# Patient Record
Sex: Male | Born: 1950
Health system: Southern US, Community
[De-identification: ages and names within clinical notes are randomized; demographics above are authoritative.]

## PROBLEM LIST (undated history)

## (undated) DIAGNOSIS — C61 Malignant neoplasm of prostate: Secondary | ICD-10-CM

## (undated) DIAGNOSIS — K635 Polyp of colon: Secondary | ICD-10-CM

## (undated) DIAGNOSIS — I319 Disease of pericardium, unspecified: Secondary | ICD-10-CM

## (undated) DIAGNOSIS — K759 Inflammatory liver disease, unspecified: Secondary | ICD-10-CM

## (undated) DIAGNOSIS — K5790 Diverticulosis of intestine, part unspecified, without perforation or abscess without bleeding: Secondary | ICD-10-CM

## (undated) DIAGNOSIS — I2510 Atherosclerotic heart disease of native coronary artery without angina pectoris: Secondary | ICD-10-CM

## (undated) DIAGNOSIS — K224 Dyskinesia of esophagus: Secondary | ICD-10-CM

## (undated) DIAGNOSIS — K900 Celiac disease: Secondary | ICD-10-CM

## (undated) DIAGNOSIS — D649 Anemia, unspecified: Secondary | ICD-10-CM

## (undated) DIAGNOSIS — E785 Hyperlipidemia, unspecified: Secondary | ICD-10-CM

## (undated) DIAGNOSIS — J40 Bronchitis, not specified as acute or chronic: Secondary | ICD-10-CM

## (undated) DIAGNOSIS — D509 Iron deficiency anemia, unspecified: Secondary | ICD-10-CM

## (undated) DIAGNOSIS — K219 Gastro-esophageal reflux disease without esophagitis: Secondary | ICD-10-CM

## (undated) DIAGNOSIS — I4819 Other persistent atrial fibrillation: Secondary | ICD-10-CM

## (undated) HISTORY — DX: Polyp of colon: K63.5

## (undated) HISTORY — DX: Celiac disease: K90.0

## (undated) HISTORY — DX: Disease of pericardium, unspecified: I31.9

## (undated) HISTORY — PX: HERNIA REPAIR: SHX51

## (undated) HISTORY — DX: Other persistent atrial fibrillation: I48.19

## (undated) HISTORY — DX: Hyperlipidemia, unspecified: E78.5

## (undated) HISTORY — PX: TRANSURETHRAL RESECTION OF PROSTATE: SHX73

## (undated) HISTORY — DX: Gastro-esophageal reflux disease without esophagitis: K21.9

## (undated) HISTORY — DX: Malignant neoplasm of prostate: C61

## (undated) HISTORY — DX: Diverticulosis of intestine, part unspecified, without perforation or abscess without bleeding: K57.90

## (undated) HISTORY — DX: Anemia, unspecified: D64.9

## (undated) HISTORY — DX: Dyskinesia of esophagus: K22.4

## (undated) HISTORY — DX: Iron deficiency anemia, unspecified: D50.9

---

## 1999-07-07 ENCOUNTER — Emergency Department (HOSPITAL_COMMUNITY): Admission: EM | Admit: 1999-07-07 | Discharge: 1999-07-08 | Payer: Self-pay

## 2001-10-08 ENCOUNTER — Encounter: Payer: Self-pay | Admitting: Family Medicine

## 2001-10-08 ENCOUNTER — Encounter: Admission: RE | Admit: 2001-10-08 | Discharge: 2001-10-08 | Payer: Self-pay | Admitting: Family Medicine

## 2002-01-07 ENCOUNTER — Encounter: Payer: Self-pay | Admitting: Cardiovascular Disease

## 2002-01-07 ENCOUNTER — Observation Stay (HOSPITAL_COMMUNITY): Admission: EM | Admit: 2002-01-07 | Discharge: 2002-01-08 | Payer: Self-pay | Admitting: Emergency Medicine

## 2003-02-06 ENCOUNTER — Ambulatory Visit: Admission: RE | Admit: 2003-02-06 | Discharge: 2003-02-06 | Payer: Self-pay | Admitting: Family Medicine

## 2004-09-13 ENCOUNTER — Encounter: Payer: Self-pay | Admitting: Cardiovascular Disease

## 2004-09-13 HISTORY — PX: CARDIAC CATHETERIZATION: SHX172

## 2006-02-23 ENCOUNTER — Ambulatory Visit: Payer: Self-pay | Admitting: Internal Medicine

## 2006-10-22 ENCOUNTER — Ambulatory Visit: Payer: Self-pay | Admitting: Internal Medicine

## 2006-10-29 ENCOUNTER — Ambulatory Visit: Payer: Self-pay | Admitting: Internal Medicine

## 2006-12-15 ENCOUNTER — Ambulatory Visit (HOSPITAL_BASED_OUTPATIENT_CLINIC_OR_DEPARTMENT_OTHER): Admission: RE | Admit: 2006-12-15 | Discharge: 2006-12-16 | Payer: Self-pay | Admitting: Urology

## 2006-12-15 ENCOUNTER — Encounter: Payer: Self-pay | Admitting: Urology

## 2007-11-30 ENCOUNTER — Encounter: Payer: Self-pay | Admitting: Cardiovascular Disease

## 2007-11-30 HISTORY — PX: NM MYOCAR PERF WALL MOTION: HXRAD629

## 2008-10-11 ENCOUNTER — Encounter: Admission: RE | Admit: 2008-10-11 | Discharge: 2008-10-11 | Payer: Self-pay | Admitting: Family Medicine

## 2010-05-10 ENCOUNTER — Encounter
Admission: RE | Admit: 2010-05-10 | Discharge: 2010-05-10 | Payer: Self-pay | Source: Home / Self Care | Attending: Family Medicine | Admitting: Family Medicine

## 2010-09-03 NOTE — Op Note (Signed)
NAME:  Jose Warner, BILODEAU              ACCOUNT NO.:  [id number]   MEDICAL RECORD NO.:  [id number]          PATIENT TYPE:  AMB   LOCATION:  [facility]                         FACILITY:  [facility]   PHYSICIAN:  Lillette Boxer. Dahlstedt, M.D.DATE OF BIRTH:  [birth year]   DATE OF PROCEDURE:  12/15/2006  DATE OF DISCHARGE:                               OPERATIVE REPORT   PREOPERATIVE DIAGNOSIS:  Benign prostatic hypertrophy with retention.   POSTOPERATIVE DIAGNOSIS:  Benign prostatic hypertrophy with retention.   PRINCIPAL PROCEDURE:  Transurethral resection of the prostate surgery.   SURGEON:  Lillette Boxer. Dahlstedt, M.D.   ANESTHESIA:  General LMA.   COMPLICATIONS:  None.   BRIEF HISTORY:  This is a 60 year old gentleman who presented to my  office in June with voiding symptoms.  He had significant symptoms  including nighttime leakage, frequency, intermittency.  He was found to  have a significant postvoid residual.  He failed conventional medical  therapy and then underwent urodynamic study which showed high pressure  voiding with a residual urinary volume approaching a liter.  He had a  significant bleed decreased flow rate.  Cystoscopy revealed rather small  prostate, but a high bladder neck.  He had significant trabeculations  and cellule formation.  He also has a history of some hydronephrosis on  the right and reflux with voiding.   It was recommended, as he failed conventional medical therapy, that he  undergo surgical management of his BPH.  I talked about a limited  TURP/TUIP with him.  He also told them about risks and complications  including but not limited to bleeding, slight risk of postoperative  stricture, retrograde ejaculation, among others.  He understands these  and has desire to proceed.   DESCRIPTION OF PROCEDURE:  The patient was administered preoperative IV  antibiotics, identified in the holding area taken to the operating room  where general anesthetic was administered  using LMA.  He was placed in  the dorsal lithotomy position.  Genitalia and perineum were prepped and  draped.  Rigid cystoscopy was performed showing a rather small but  obstructive prostate with an elevated bladder neck.  Bladder was  inspected circumferentially.  No tumors or foreign bodies.  Multiple  significant trabeculations, small diverticula formation scattered  throughout.   The bladder was then emptied.  The resectoscope sheath was then placed  and the resectoscope element and the cutting loop placed.  A TUIP was  first performed, incising and removing the prostatic tissue from the 5  to 7 o'clock positions from bladder neck all the way to the verumontanum  which was left intact.  This opened the prostate up somewhat.  However,  there was still a lateral lobe tissue that I wanted to get rid of.  This  was resected bilaterally down to the surgical capsule with the cutting  loop.  Small bleeders were electrocoagulated.  The chips were then  evacuated from the bladder.  In the prostatic fossa with fossa was  inspected at low pressure and small bleeders were again  electrocoagulated.  At this point the bladder was reinspected.  No chips  were seen.  The resectoscope was then  removed.  A 22-French Foley catheter with 5 mL balloon was then placed  and hooked to dependent drainage.  A B & O suppository was placed.   The patient tolerated procedure well.  He was awakened and taken to PACU  in stable condition.      Lillette Boxer. Dahlstedt, M.D.  Electronically Signed     SMD/MEDQ  D:  12/15/2006  T:  12/15/2006  Job:  [id number]

## 2010-09-06 NOTE — Assessment & Plan Note (Signed)
[facility]                           GASTROENTEROLOGY OFFICE NOTE   Jose Warner, Jose Warner                     MRN:          [id number]  DATE:02/23/2006                            DOB:          1950/08/16    REASON FOR CONSULTATION:  Surveillance colonoscopy.   HISTORY:  This is a 60 year old gentleman with a history of adenomatous  colon polyps who presents today regarding surveillance colonoscopy.  His  initial examination was performed on March 01, 2003.  He was found to  have a 5 mm tubular adenoma in the region of the splenic flexure which was  removed.  Also mild diverticulosis and internal hemorrhoids.  The  preparation was excellent.  No other abnormalities.  At that time  surveillance was requested for 3 years.  Since that time, he has done well.  He did undergo a cardiac catheterization and was found to have  nonobstructive coronary artery disease which was treated medically,  otherwise well.  Currently with no GI symptoms.  He denies abdominal pain,  change in bowel habits, or rectal bleeding.   PAST MEDICAL HISTORY:  1. Hyperlipidemia.  2. Nonobstructive coronary artery disease.  3. History of adenomatous colon polyp.   PAST SURGICAL HISTORY:  None.   ALLERGIES:  SULFA which results in rash.   CURRENT MEDICATIONS:  1. Aspirin 81 mg daily.  2. Doxazosin 1 mg daily.  3. Vytorin unspecified dosage once daily.  4. Fish Oil supplement.   FAMILY HISTORY:  Negative for gastrointestinal malignancy.   SOCIAL HISTORY:  The patient is married with three children and lives with  his wife and daughter.  He has a PHD.  He works in [employer] for [employer].  He does not smoke.  He occasionally uses alcohol.   REVIEW OF SYSTEMS:  Per diagnostic evaluation form.   PHYSICAL EXAMINATION:  GENERAL:  A well-appearing male in no acute distress.  He is alert and oriented.  VITAL SIGNS:  Blood pressure was not recorded.  Heart rate is 76 and  regular.  Weight is 171.2 pounds.  He is 5 feet 6 inches in height.  HEENT:  Sclerae anicteric.  Conjunctiva are pink.  Oral mucosa is intact.  LUNGS:  Clear.  HEART:  Regular.  ABDOMEN:  Soft without tenderness, mass, or hernia.  Good bowel sounds  heard.   IMPRESSION:  This is a 60 year old gentleman who presents regarding  surveillance colonoscopy.  One small tubular adenoma 3 years ago.  Currently  without symptoms.  I discussed with him the current recommendations for  surveillance colonoscopy.  Appropriate surveillance interval for this  patient would be 5 rather than 3 years.  Since his preparation was excellent  and he is asymptomatic, then that will be the recommendation that we will  submit to.  He understands and is appreciative.  We have changed his recall  to November of 2009.  Of course, he knows if in the interim there are any  clinical issues that he should represent for re-evaluation and consideration  of interval colonoscopy.    ______________________________  Jose Warner., MD  JNP/MedQ  DD: 02/23/2006  DT: 02/23/2006  Job #: [id number]   cc:   Suszanne Conners, M.D.

## 2010-09-06 NOTE — Discharge Summary (Signed)
NAME:  Jose Warner, Jose Warner                        ACCOUNT NO.:  [id number]   MEDICAL RECORD NO.:  [id number]                   PATIENT TYPE:  INP   LOCATION:  4736                                 FACILITY:  [facility]   PHYSICIAN:  York Grice, P.A.              DATE OF BIRTH:  09-Nov-1950   DATE OF ADMISSION:  01/07/2002  DATE OF DISCHARGE:  01/08/2002                                 DISCHARGE SUMMARY   DISCHARGE DIAGNOSES:  1. Atypical chest pain.  Ruled out for myocardial infarction by negative     cardiac enzymes and nonspecific electrocardiogram and no specific     electrocardiogram changes.  2. Hypertension.  3. Family history of premature coronary artery disease.  4. History of _____ gastritis.  5. History of hepatitis.   HISTORY:  This is a 60 year old, Caucasian gentleman, who presented to the  emergency room at [facility] with complaints of chest pain for  approximately three weeks of duration.  The pain was located on the left  side of the chest and upper part of the epigastrium.  He has not had any  associated symptoms such as shortness of breath or diaphoresis.  No nausea  or vomiting.  The patient also has not had any prior history of coronary  artery disease.  No diabetes mellitus and he is a nonsmoker, but his family  history is significant for premature coronary artery disease.   Dr. Rollene Fare assessed patient at admission, and it was felt that his pain  though somewhat atypical could be related to his gastroesophageal reflux,  but still seemed prudent to admit patient on rule out MI protocol for 24  hour observation.   He was started on Aspirin, Protonix, and on beta blocker, and cardiac  enzymes were checked, as well as EKG was performed and he was placed on  telemetry monitoring.   His EKG did not show any changes.  It was normal sinus rhythm.   Cardiac enzymes had negative values times three.  His hemoglobin was 14.7,  hematocrit 40.3, platelets  215, white blood cell count 5.5, potassium 4.3,  sodium 140, BUN 16, creatinine 0.9.  Liver function tests were within normal  limits.  Lipid profile revealed elevated cholesterol to 10, LDL 145, HDL 50,  and triglyceride 75.   Next morning patient was assessed by Dr. Claiborne Billings  and was free of pain and  considered to be stable for discharge home and he was ruled out for  myocardial infarction.   DISCHARGE MEDICATIONS:  Toprol XL 25 mg q.d., Protonix 40 mg q.d., Aspirin  coated 325 mg q.d., Flomax 0.4 mg daily.   DISCHARGE ACTIVITIES:  As tolerated.   DISCHARGE DIET:  Low fat, low cholesterol diet.   SPECIAL INSTRUCTIONS:  None.   FOLLOW UP:  The patient was discharged on the weekend, so office would  contact him to schedule for an outpatient Cardiolite stress test and  appointment with physician in followup.                                                York Grice, P.A.    MK/MEDQ  D:  02/28/2002  T:  03/01/2002  Job:  [id number]   cc:   Shelva Majestic, M.D.  [address], [address], [place] [address]  Fax: [phone]

## 2010-12-12 ENCOUNTER — Other Ambulatory Visit: Payer: Self-pay | Admitting: Family Medicine

## 2010-12-12 DIAGNOSIS — R0789 Other chest pain: Secondary | ICD-10-CM

## 2010-12-18 ENCOUNTER — Ambulatory Visit
Admission: RE | Admit: 2010-12-18 | Discharge: 2010-12-18 | Disposition: A | Discharge status: Home / Self Care | Payer: BC Managed Care – PPO | Source: Ambulatory Visit | Attending: Family Medicine | Admitting: Family Medicine

## 2010-12-18 DIAGNOSIS — R0789 Other chest pain: Secondary | ICD-10-CM

## 2011-01-22 ENCOUNTER — Other Ambulatory Visit: Payer: Self-pay | Admitting: Family Medicine

## 2011-01-23 ENCOUNTER — Other Ambulatory Visit: Payer: Self-pay | Admitting: Family Medicine

## 2011-01-28 ENCOUNTER — Other Ambulatory Visit: Payer: BC Managed Care – PPO

## 2011-01-31 LAB — POCT HEMOGLOBIN-HEMACUE
Hemoglobin: 14.6
Operator id: 268271

## 2011-02-11 ENCOUNTER — Ambulatory Visit
Admission: RE | Admit: 2011-02-11 | Discharge: 2011-02-11 | Disposition: A | Discharge status: Home / Self Care | Payer: BC Managed Care – PPO | Source: Ambulatory Visit | Attending: Family Medicine | Admitting: Family Medicine

## 2011-04-22 DIAGNOSIS — K224 Dyskinesia of esophagus: Secondary | ICD-10-CM

## 2011-04-22 HISTORY — DX: Dyskinesia of esophagus: K22.4

## 2011-11-06 ENCOUNTER — Telehealth: Payer: Self-pay | Admitting: *Deleted

## 2011-11-06 NOTE — Telephone Encounter (Signed)
I called this patient in error.  This is the year he should have a colon, but Dr. Marina Goodell has to review the info on the recall charts before we call patients.

## 2011-11-06 NOTE — Telephone Encounter (Signed)
I called and asked for [name] and his wife told me he was out of town.  I advised I was calling regarding him having a colonoscopy in 2008 and he is due now in 2013.  She told me she will tell him when he comes home .  She will have him call us.

## 2011-12-09 ENCOUNTER — Encounter: Payer: Self-pay | Admitting: Internal Medicine

## 2012-01-23 ENCOUNTER — Encounter: Payer: Self-pay | Admitting: Internal Medicine

## 2012-03-10 ENCOUNTER — Ambulatory Visit (AMBULATORY_SURGERY_CENTER): Payer: BC Managed Care – PPO | Admitting: *Deleted

## 2012-03-10 VITALS — Ht 67.0 in | Wt 170.0 lb

## 2012-03-10 DIAGNOSIS — Z86010 Personal history of colonic polyps: Secondary | ICD-10-CM

## 2012-03-10 DIAGNOSIS — Z1211 Encounter for screening for malignant neoplasm of colon: Secondary | ICD-10-CM

## 2012-03-10 MED ORDER — MOVIPREP 100 G PO SOLR: ORAL | Status: DC

## 2012-03-10 NOTE — Progress Notes (Signed)
Patient was instructed to bring inhaler with him to colonoscopy procedure. He states understanding. Sherren Kerns

## 2012-03-24 ENCOUNTER — Encounter: Payer: BC Managed Care – PPO | Admitting: Internal Medicine

## 2012-04-06 ENCOUNTER — Encounter: Payer: Self-pay | Admitting: Internal Medicine

## 2012-04-06 ENCOUNTER — Ambulatory Visit (AMBULATORY_SURGERY_CENTER): Payer: BC Managed Care – PPO | Admitting: Internal Medicine

## 2012-04-06 VITALS — BP 128/73 | HR 63 | Temp 97.2°F | Resp 21 | Ht 67.0 in | Wt 170.0 lb

## 2012-04-06 DIAGNOSIS — Z86010 Personal history of colonic polyps: Secondary | ICD-10-CM

## 2012-04-06 DIAGNOSIS — Z1211 Encounter for screening for malignant neoplasm of colon: Secondary | ICD-10-CM

## 2012-04-06 MED ORDER — SODIUM CHLORIDE 0.9 % IV SOLN: 500.0000 mL | INTRAVENOUS | Status: DC

## 2012-04-06 NOTE — Op Note (Signed)
[facility]  [address] [place], [address]   COLONOSCOPY PROCEDURE REPORT  PATIENT: Jose Warner, Jose Warner  MR#: [id number] BIRTHDATE: [birth year] , 61  yrs. old GENDER: Male ENDOSCOPIST: Eustace Quail, MD REFERRED LD:[id number] Program Recall PROCEDURE DATE:  04/06/2012 PROCEDURE:   Colonoscopy, surveillance ASA CLASS:   Class II INDICATIONS:Patient's personal history of adenomatous colon polyps. - 2004, 2008 w/ TA's MEDICATIONS: MAC sedation, administered by CRNA and propofol (Diprivan) 324m IV  DESCRIPTION OF PROCEDURE:   After the risks benefits and alternatives of the procedure were thoroughly explained, informed consent was obtained.  A digital rectal exam revealed no abnormalities of the rectum.   The LB CF-[id number] endoscope was introduced through the anus and advanced to the cecum, which was identified by both the appendix and ileocecal valve. No adverse events experienced.   The quality of the prep was good, using MoviPrep  The instrument was then slowly withdrawn as the colon was fully examined.      COLON FINDINGS: Moderate diverticulosis was noted in the sigmoid colon.   The colon was otherwise normal.  There was no diverticulosis, inflammation, polyps or cancers unless previously stated.  Retroflexed views revealed internal hemorrhoids. The time to cecum=3 minutes 38 seconds.  Withdrawal time=12 minutes 40 seconds.  The scope was withdrawn and the procedure completed. COMPLICATIONS: There were no complications.  ENDOSCOPIC IMPRESSION: 1.   Moderate diverticulosis was noted in the sigmoid colon 2.   The colon was otherwise normal  RECOMMENDATIONS: 1. Follow up colonoscopy in 5 years   eSigned:  JEustace Quail MD 04/06/2012 4:18 PM   cc: PDerinda Late MD and The Patient

## 2012-04-06 NOTE — Patient Instructions (Addendum)
Impressions/recommendations:  Diverticulosis (handout given)  High Fiber diet (handout given)  Follow up colonoscopy in 5 years.  YOU HAD AN ENDOSCOPIC PROCEDURE TODAY AT THE  ENDOSCOPY CENTER: Refer to the procedure report that was given to you for any specific questions about what was found during the examination.  If the procedure report does not answer your questions, please call your gastroenterologist to clarify.  If you requested that your care partner not be given the details of your procedure findings, then the procedure report has been included in a sealed envelope for you to review at your convenience later.  YOU SHOULD EXPECT: Some feelings of bloating in the abdomen. Passage of more gas than usual.  Walking can help get rid of the air that was put into your GI tract during the procedure and reduce the bloating. If you had a lower endoscopy (such as a colonoscopy or flexible sigmoidoscopy) you may notice spotting of blood in your stool or on the toilet paper. If you underwent a bowel prep for your procedure, then you may not have a normal bowel movement for a few days.  DIET: Your first meal following the procedure should be a light meal and then it is ok to progress to your normal diet.  A half-sandwich or bowl of soup is an example of a good first meal.  Heavy or fried foods are harder to digest and may make you feel nauseous or bloated.  Likewise meals heavy in dairy and vegetables can cause extra gas to form and this can also increase the bloating.  Drink plenty of fluids but you should avoid alcoholic beverages for 24 hours.  ACTIVITY: Your care partner should take you home directly after the procedure.  You should plan to take it easy, moving slowly for the rest of the day.  You can resume normal activity the day after the procedure however you should NOT DRIVE or use heavy machinery for 24 hours (because of the sedation medicines used during the test).    SYMPTOMS TO REPORT  IMMEDIATELY: A gastroenterologist can be reached at any hour.  During normal business hours, 8:30 AM to 5:00 PM Monday through Friday, call [phone].  After hours and on weekends, please call the GI answering service at [phone] who will take a message and have the physician on call contact you.   Following lower endoscopy (colonoscopy or flexible sigmoidoscopy):  Excessive amounts of blood in the stool  Significant tenderness or worsening of abdominal pains  Swelling of the abdomen that is new, acute  Fever of 100F or higher   FOLLOW UP: If any biopsies were taken you will be contacted by phone or by letter within the next 1-3 weeks.  Call your gastroenterologist if you have not heard about the biopsies in 3 weeks.  Our staff will call the home number listed on your records the next business day following your procedure to check on you and address any questions or concerns that you may have at that time regarding the information given to you following your procedure. This is a courtesy call and so if there is no answer at the home number and we have not heard from you through the emergency physician on call, we will assume that you have returned to your regular daily activities without incident.  SIGNATURES/CONFIDENTIALITY: You and/or your care partner have signed paperwork which will be entered into your electronic medical record.  These signatures attest to the fact that that the information  above on your After Visit Summary has been reviewed and is understood.  Full responsibility of the confidentiality of this discharge information lies with you and/or your care-partner.  

## 2012-04-06 NOTE — Progress Notes (Signed)
Patient did not experience any of the following events: a burn prior to discharge; a fall within the facility; wrong site/side/patient/procedure/implant event; or a hospital transfer or hospital admission upon discharge from the facility. (G8907) Patient did not have preoperative order for IV antibiotic SSI prophylaxis. (G8918)  

## 2012-04-07 ENCOUNTER — Telehealth: Payer: Self-pay

## 2012-04-07 NOTE — Telephone Encounter (Signed)
Left Message

## 2012-05-05 ENCOUNTER — Other Ambulatory Visit: Payer: Self-pay | Admitting: Urology

## 2012-05-07 ENCOUNTER — Encounter (HOSPITAL_COMMUNITY): Payer: Self-pay | Admitting: Pharmacy Technician

## 2012-05-10 ENCOUNTER — Encounter (HOSPITAL_COMMUNITY): Payer: Self-pay | Admitting: Pharmacy Technician

## 2012-05-10 ENCOUNTER — Encounter (HOSPITAL_COMMUNITY): Payer: Self-pay

## 2012-05-10 ENCOUNTER — Ambulatory Visit (HOSPITAL_COMMUNITY)
Admission: RE | Admit: 2012-05-10 | Discharge: 2012-05-10 | Disposition: A | Discharge status: Home / Self Care | Payer: BC Managed Care – PPO | Source: Ambulatory Visit | Attending: Urology | Admitting: Urology

## 2012-05-10 ENCOUNTER — Encounter (HOSPITAL_COMMUNITY)
Admission: RE | Admit: 2012-05-10 | Discharge: 2012-05-10 | Disposition: A | Discharge status: Home / Self Care | Payer: BC Managed Care – PPO | Source: Ambulatory Visit | Attending: Urology | Admitting: Urology

## 2012-05-10 DIAGNOSIS — K759 Inflammatory liver disease, unspecified: Secondary | ICD-10-CM

## 2012-05-10 DIAGNOSIS — C61 Malignant neoplasm of prostate: Secondary | ICD-10-CM

## 2012-05-10 DIAGNOSIS — Z01812 Encounter for preprocedural laboratory examination: Secondary | ICD-10-CM | POA: Insufficient documentation

## 2012-05-10 DIAGNOSIS — J40 Bronchitis, not specified as acute or chronic: Secondary | ICD-10-CM

## 2012-05-10 DIAGNOSIS — Z01818 Encounter for other preprocedural examination: Secondary | ICD-10-CM | POA: Insufficient documentation

## 2012-05-10 HISTORY — DX: Inflammatory liver disease, unspecified: K75.9

## 2012-05-10 HISTORY — DX: Bronchitis, not specified as acute or chronic: J40

## 2012-05-10 HISTORY — DX: Malignant neoplasm of prostate: C61

## 2012-05-10 LAB — BASIC METABOLIC PANEL
BUN: 13 mg/dL (ref 6–23)
CO2: 23 mEq/L (ref 19–32)
Chloride: 104 mEq/L (ref 96–112)
Creatinine, Ser: 1.05 mg/dL (ref 0.50–1.35)

## 2012-05-10 LAB — ABO/RH: ABO/RH(D): A POS

## 2012-05-10 LAB — CBC
HCT: 40.9 % (ref 39.0–52.0)
MCV: 86.1 fL (ref 78.0–100.0)
Platelets: 239 10*3/uL (ref 150–400)
RBC: 4.75 MIL/uL (ref 4.22–5.81)
WBC: 5.9 10*3/uL (ref 4.0–10.5)

## 2012-05-10 NOTE — Patient Instructions (Addendum)
[address]  05/10/2012   Your procedure is scheduled on: 1-30  -2014  Report to [facility] at      0515  AM.  Call this number if you have problems the morning of surgery: [phone]  Or Presurgical Testing [phone](Elis Sauber)   Remember: Follow any bowel prep instructions per MD office. For Cpap use: Bring mask and tubing only.   Do not eat food:After Midnight.  Follow Clear Lliquid diet x24 hours day before surgery, start 0700 AM until midnight.  Clear liquids include soda, tea, black coffee, apple or grape juice, broth.  Take these medicines the morning of surgery with A SIP OF WATER: none. Bring ProAir inhaler to hospital and eye drops.   Do not wear jewelry, make-up or nail polish.  Do not wear lotions, powders, or perfumes. You may wear deodorant.  Do not shave 12 hours prior to first CHG shower(legs and under arms).(face and neck okay.)  Do not bring valuables to the hospital.  Contacts, dentures or bridgework,body piercing,  may not be worn into surgery.  Leave suitcase in the car. After surgery it may be brought to your room.  For patients admitted to the hospital, checkout time is 11:00 AM the day of discharge.   Patients discharged the day of surgery will not be allowed to drive home. Must have responsible person with you x 24 hours once discharged.  Name and phone number of your driver: Nita Sells, spouse -[phone]/ [phone]  Special Instructions: CHG Shower Use Special Wash: see special instructions.(avoid face and genitals)   Please read over the following fact sheets that you were given: MRSA Information, Blood Transfusion fact sheet, Incentive Spirometry Instruction.    Failure to follow these instructions may result in Cancellation of your surgery.   Patient signature_______________________________________________________

## 2012-05-10 NOTE — Pre-Procedure Instructions (Signed)
1-20-114 EKG 9'13-report with chart. CXR done today.

## 2012-05-19 NOTE — Anesthesia Preprocedure Evaluation (Addendum)
Anesthesia Evaluation  Patient identified by MRN, date of birth, ID band Patient awake    Reviewed: Allergy & Precautions, H&P , NPO status , Patient's Chart, lab work & pertinent test results  Airway Mallampati: I TM Distance: >3 FB Neck ROM: Full    Dental  (+) Dental Advisory Given and Teeth Intact   Pulmonary neg pulmonary ROS,  breath sounds clear to auscultation  Pulmonary exam normal       Cardiovascular negative cardio ROS  Rhythm:Regular Rate:Normal     Neuro/Psych negative neurological ROS  negative psych ROS   GI/Hepatic negative GI ROS, Neg liver ROS, (+) Hepatitis -, Unspecified  Endo/Other  negative endocrine ROS  Renal/GU negative Renal ROS     Musculoskeletal negative musculoskeletal ROS (+)   Abdominal   Peds  Hematology negative hematology ROS (+)   Anesthesia Other Findings   Reproductive/Obstetrics                          Anesthesia Physical Anesthesia Plan  ASA: II  Anesthesia Plan: General   Post-op Pain Management:    Induction: Intravenous  Airway Management Planned: Oral ETT  Additional Equipment:   Intra-op Plan:   Post-operative Plan: Extubation in OR  Informed Consent: I have reviewed the patients History and Physical, chart, labs and discussed the procedure including the risks, benefits and alternatives for the proposed anesthesia with the patient or authorized representative who has indicated his/her understanding and acceptance.   Dental advisory given  Plan Discussed with: CRNA  Anesthesia Plan Comments:         Anesthesia Quick Evaluation

## 2012-05-19 NOTE — H&P (Signed)
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  He was treated with a TURP by Dr. Diona Fanti in 2008. He initially presented to me in November 2013 and was noted to have a PVR > 400 cc.  He started tamsulsoin at that time and his PVR decreased to about 200 cc.  Aug 2008 - TURP (pathology benign)  2) Prostate cancer: He was found to have a new prostate nodule in 2013 and underwent a prostate biopsy on 04/12/12 for this reason which demonstrated 3 out of 12 biopsy cores to be positive for Gleason 3+3=6 adenocarcinoma of the prostate. He has no family history of prostate cancer.  TNM stage: cT2a Nx Mx PSA: 0.82 Gleason score: 3+3=6 Biopsy (04/12/12): 3/12 cores positive - L apex (< 5%), L lateral base (10%), R lateral base (10%) Prostate volume: 19.9 cc PSAD: 0.04  Baseline urinary function: IPSS was 4 at baseline but PVR was greater than 400 cc which improved after alpha blocker therapy. Baseline erectile function: SHIM score was 16.  Interval history:  He and his wife followup today to discuss his recently diagnosed prostate cancer and his options for treatment/management. Overall, he has recovered well from his biopsy although still does have some mild to moderate aching in his testicles bilaterally. He denies any fever, dysuria, or hematuria.     Past Medical History Problems  1. History of  Arthritis V13.4 2. History of  Asthma 493.90 3. History of  Heartburn 787.1 4. History of  Hepatitis 573.3 5. History of  Hypercholesterolemia 272.0 6. History of  Inguinal Hernia 550.90  Surgical History Problems  1. History of  Hernia Repair 2. History of  Transurethral Resection Of Prostate (TURP)   Right inguinal hernia repair   Current Meds 1. Aspirin 81 MG Oral Tablet; Therapy: (Recorded:20Nov2013) to 2. Centrum Silver  TABS; Therapy: (Recorded:20Nov2013) to 3. Niaspan 500 MG Oral Tablet Extended Release; Therapy: (Recorded:20Nov2013) to 4. ProAir HFA 108 (90 Base) MCG/ACT Inhalation Aerosol Solution; Therapy: 25Oct2013 to 5. Simvastatin 40 MG Oral Tablet; Therapy: [id number] to 6. Tamsulosin HCl 0.4 MG Oral Capsule; TAKE 1 CAPSULE Bedtime; Therapy: 23Dec2013 to (Last  Rx:23Dec2013)  Requested for: 23Dec2013 7. Vitamin D 2000 UNIT Oral Tablet; Therapy: (Recorded:20Nov2013) to  Allergies Medication  1. Sulfa Drugs  Family History Problems  1. Paternal history of  Acute Myocardial Infarction V17.3 2. Maternal history of  Stroke Syndrome V17.1  Social History Problems  1. Alcohol Use 1 per week 2. Paternal history of  Death In The Family Father age 52 MI 3. Marital History - Currently Married 4. Never A Smoker 5. 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 Tobacco Use  Vitals Vital Signs [Data Includes: Last 1 Day]  [id number] 12:33PM  Blood Pressure: 123 / 65 Heart Rate: 65  Physical Exam Constitutional: Well nourished and well developed . No acute distress.  ENT:. The ears and nose are normal in appearance.  Pulmonary: No respiratory distress and normal respiratory rhythm and effort.  Cardiovascular: Heart rate and rhythm are normal . No peripheral edema.  Neuro/Psych:. Mood and affect are appropriate.    Results/Data Urine [Data Includes: Last 1 Day]   [id number]  COLOR YELLOW   APPEARANCE CLEAR   SPECIFIC GRAVITY 1.010   pH 5.0   GLUCOSE NEG mg/dL  BILIRUBIN NEG   KETONE NEG mg/dL  BLOOD  NEG   PROTEIN NEG mg/dL  UROBILINOGEN 0.2 mg/dL  NITRITE NEG   LEUKOCYTE ESTERASE NEG    Assessment Assessed  1. Prostate Cancer 185 2. Benign Prostatic Hypertrophy With Urinary Obstruction 600.01  Discussion/Summary  1. Prostate cancer:   The patient was counseled about the natural history of prostate cancer and the standard treatment options that are available for prostate cancer. It was  explained to him how his age and life expectancy, clinical stage, Gleason score, and PSA affect his prognosis, the decision to proceed with additional staging studies, as well as how that information influences recommended treatment strategies. We discussed the roles for active surveillance, radiation therapy, surgical therapy, androgen deprivation, as well as ablative therapy options for the treatment of prostate cancer as appropriate to his individual cancer situation. We discussed the risks and benefits of these options with regard to their impact on cancer control and also in terms of potential adverse events, complications, and impact on quiality of life particularly related to urinary, bowel, and sexual function. The patient was encouraged to ask questions throughout the discussion today and all questions were answered to his stated satisfaction. In addition, the patient was provided with and/or directed to appropriate resources and literature for further education about prostate cancer and treatment options.   We discussed surgical therapy for prostate cancer including the different available surgical approaches. We discussed, in detail, the risks and expectations of surgery with regard to cancer control, urinary control, and erectile function as well as the expected postoperative recovery process. Additional risks of surgery including but not limited to bleeding, infection, hernia formation, nerve damage, lymphocele formation, bowel/rectal injury potentially necessitating colostomy, damage to the urinary tract resulting in urine leakage, urethral stricture, and the cardiopulmonary risks such as myocardial infarction, stroke, death, venothromboembolism, etc. were explained. The risk of open surgical conversion for robotic/laparoscopic prostatectomy was also discussed.   He is very certain that he does wish to proceed with surgical treatment of his prostate cancer. He is not interested in active  surveillance although understands that this would be a reasonable option. He has significant concerns about radiation therapy considering his history of voiding difficulties and prior prostate obstruction. He will tentatively be scheduled for a bilateral nerve sparing robotic-assisted laparoscopic radical prostatectomy in the near future. We have extensively discussed the expected recovery process especially considering the fact that he does travel extensively by plane. I told him that I would recommend a minimum of 6-8 weeks of limited local travel although could consider DVT prophylaxis should be absolutely have to travel further distances.  Cc: Dr. Derinda Late  a total of 65 minutes were spent in the overall care of the patient today Amended By: Raynelle Bring; 05/04/2012 2:11 PMEST with 60 minutes in direct face to face consultation. Amended By: Raynelle Bring; 05/04/2012 2:11 PMEST    Amendment  I did examine him today including a GU exam which did not demonstrate any scrotal abnormalities and certainly no evidence of epididymitis. I recommended anti-inflammatories for treatment of his scrotal discomfort which has persisted following his biopsy.  Amended By: Raynelle Bring; 05/04/2012 2:11 PMEST   Signatures Electronically signed by : Raynelle Bring, M.D.; May 04 2012  2:11PM

## 2012-05-20 ENCOUNTER — Encounter (HOSPITAL_COMMUNITY)
Admission: RE | Disposition: A | Discharge status: Home / Self Care | Payer: Self-pay | Source: Ambulatory Visit | Attending: Urology

## 2012-05-20 ENCOUNTER — Observation Stay (HOSPITAL_COMMUNITY)
Admission: RE | Admit: 2012-05-20 | Discharge: 2012-05-21 | Disposition: A | Discharge status: Home / Self Care | Payer: BC Managed Care – PPO | Source: Ambulatory Visit | Attending: Urology | Admitting: Urology

## 2012-05-20 ENCOUNTER — Encounter (HOSPITAL_COMMUNITY): Payer: Self-pay | Admitting: *Deleted

## 2012-05-20 ENCOUNTER — Encounter (HOSPITAL_COMMUNITY): Payer: Self-pay | Admitting: Anesthesiology

## 2012-05-20 ENCOUNTER — Inpatient Hospital Stay (HOSPITAL_COMMUNITY): Payer: BC Managed Care – PPO | Admitting: Anesthesiology

## 2012-05-20 DIAGNOSIS — C61 Malignant neoplasm of prostate: Principal | ICD-10-CM | POA: Insufficient documentation

## 2012-05-20 DIAGNOSIS — E7800 Pure hypercholesterolemia, unspecified: Secondary | ICD-10-CM | POA: Insufficient documentation

## 2012-05-20 DIAGNOSIS — Z79899 Other long term (current) drug therapy: Secondary | ICD-10-CM | POA: Insufficient documentation

## 2012-05-20 DIAGNOSIS — Z01812 Encounter for preprocedural laboratory examination: Secondary | ICD-10-CM | POA: Insufficient documentation

## 2012-05-20 DIAGNOSIS — N401 Enlarged prostate with lower urinary tract symptoms: Secondary | ICD-10-CM | POA: Insufficient documentation

## 2012-05-20 DIAGNOSIS — J3489 Other specified disorders of nose and nasal sinuses: Secondary | ICD-10-CM | POA: Insufficient documentation

## 2012-05-20 DIAGNOSIS — R110 Nausea: Secondary | ICD-10-CM | POA: Insufficient documentation

## 2012-05-20 DIAGNOSIS — Z7982 Long term (current) use of aspirin: Secondary | ICD-10-CM | POA: Insufficient documentation

## 2012-05-20 DIAGNOSIS — J45909 Unspecified asthma, uncomplicated: Secondary | ICD-10-CM | POA: Insufficient documentation

## 2012-05-20 DIAGNOSIS — K759 Inflammatory liver disease, unspecified: Secondary | ICD-10-CM | POA: Insufficient documentation

## 2012-05-20 DIAGNOSIS — N138 Other obstructive and reflux uropathy: Secondary | ICD-10-CM | POA: Insufficient documentation

## 2012-05-20 HISTORY — PX: ROBOT ASSISTED LAPAROSCOPIC RADICAL PROSTATECTOMY: SHX5141

## 2012-05-20 SURGERY — ROBOTIC ASSISTED LAPAROSCOPIC RADICAL PROSTATECTOMY LEVEL 1
Anesthesia: General | Wound class: Clean Contaminated

## 2012-05-20 MED ORDER — HYDROMORPHONE HCL PF 1 MG/ML IJ SOLN
INTRAMUSCULAR | Status: DC | PRN
  Administered 2012-05-20: 1 mg via INTRAVENOUS
  Administered 2012-05-20: 0.5 mg via INTRAVENOUS

## 2012-05-20 MED ORDER — LACTATED RINGERS IV SOLN
INTRAVENOUS | Status: DC | PRN
  Administered 2012-05-20 (×2): via INTRAVENOUS

## 2012-05-20 MED ORDER — LACTATED RINGERS IV SOLN
INTRAVENOUS | Status: DC | PRN
  Administered 2012-05-20: 08:00:00

## 2012-05-20 MED ORDER — ONDANSETRON HCL 4 MG/2ML IJ SOLN
4.0000 mg | Freq: Four times a day (QID) | INTRAMUSCULAR | Status: DC | PRN
  Administered 2012-05-20: 4 mg via INTRAVENOUS
  Filled 2012-05-20: qty 2

## 2012-05-20 MED ORDER — SODIUM CHLORIDE 0.9 % IV BOLUS (SEPSIS)
1000.0000 mL | Freq: Once | INTRAVENOUS | Status: AC
  Administered 2012-05-20: 1000 mL via INTRAVENOUS

## 2012-05-20 MED ORDER — OXYCODONE HCL 5 MG/5ML PO SOLN
5.0000 mg | Freq: Once | ORAL | Status: DC | PRN
  Filled 2012-05-20: qty 5

## 2012-05-20 MED ORDER — KCL IN DEXTROSE-NACL 20-5-0.45 MEQ/L-%-% IV SOLN
INTRAVENOUS | Status: DC
  Administered 2012-05-20 (×2): via INTRAVENOUS
  Filled 2012-05-20 (×5): qty 1000

## 2012-05-20 MED ORDER — ONDANSETRON HCL 4 MG/2ML IJ SOLN
INTRAMUSCULAR | Status: DC | PRN
  Administered 2012-05-20 (×2): 2 mg via INTRAVENOUS

## 2012-05-20 MED ORDER — GLYCOPYRROLATE 0.2 MG/ML IJ SOLN
INTRAMUSCULAR | Status: DC | PRN
  Administered 2012-05-20 (×2): 0.4 mg via INTRAVENOUS

## 2012-05-20 MED ORDER — MEPERIDINE HCL 50 MG/ML IJ SOLN: 6.2500 mg | INTRAMUSCULAR | Status: DC | PRN

## 2012-05-20 MED ORDER — CIPROFLOXACIN HCL 500 MG PO TABS: 500.0000 mg | ORAL_TABLET | Freq: Two times a day (BID) | ORAL | Status: DC

## 2012-05-20 MED ORDER — HYDROMORPHONE HCL PF 1 MG/ML IJ SOLN
0.2500 mg | INTRAMUSCULAR | Status: DC | PRN
  Administered 2012-05-20 (×2): 0.5 mg via INTRAVENOUS

## 2012-05-20 MED ORDER — MORPHINE SULFATE 10 MG/ML IJ SOLN: 2.0000 mg | INTRAMUSCULAR | Status: DC | PRN

## 2012-05-20 MED ORDER — DIPHENHYDRAMINE HCL 12.5 MG/5ML PO ELIX
12.5000 mg | ORAL_SOLUTION | Freq: Four times a day (QID) | ORAL | Status: DC | PRN
  Filled 2012-05-20: qty 10

## 2012-05-20 MED ORDER — CISATRACURIUM BESYLATE (PF) 10 MG/5ML IV SOLN
INTRAVENOUS | Status: DC | PRN
  Administered 2012-05-20: 6 mg via INTRAVENOUS
  Administered 2012-05-20: 10 mg via INTRAVENOUS

## 2012-05-20 MED ORDER — FENTANYL CITRATE 0.05 MG/ML IJ SOLN
INTRAMUSCULAR | Status: DC | PRN
  Administered 2012-05-20: 50 ug via INTRAVENOUS
  Administered 2012-05-20: 25 ug via INTRAVENOUS
  Administered 2012-05-20 (×3): 50 ug via INTRAVENOUS
  Administered 2012-05-20: 75 ug via INTRAVENOUS

## 2012-05-20 MED ORDER — MIDAZOLAM HCL 5 MG/5ML IJ SOLN
INTRAMUSCULAR | Status: DC | PRN
  Administered 2012-05-20: 2 mg via INTRAVENOUS

## 2012-05-20 MED ORDER — SODIUM CHLORIDE 0.9 % IR SOLN
Status: DC | PRN
  Administered 2012-05-20: 1000 mL

## 2012-05-20 MED ORDER — NIACIN ER (ANTIHYPERLIPIDEMIC) 500 MG PO TBCR
500.0000 mg | EXTENDED_RELEASE_TABLET | Freq: Every day | ORAL | Status: DC
  Administered 2012-05-20: 500 mg via ORAL
  Filled 2012-05-20 (×2): qty 1

## 2012-05-20 MED ORDER — ACETAMINOPHEN 10 MG/ML IV SOLN: 1000.0000 mg | Freq: Once | INTRAVENOUS | Status: DC | PRN

## 2012-05-20 MED ORDER — CEFAZOLIN SODIUM-DEXTROSE 2-3 GM-% IV SOLR
2.0000 g | INTRAVENOUS | Status: AC
  Administered 2012-05-20: 2 g via INTRAVENOUS

## 2012-05-20 MED ORDER — DIPHENHYDRAMINE HCL 50 MG/ML IJ SOLN: 12.5000 mg | Freq: Four times a day (QID) | INTRAMUSCULAR | Status: DC | PRN

## 2012-05-20 MED ORDER — NEOSTIGMINE METHYLSULFATE 1 MG/ML IJ SOLN
INTRAMUSCULAR | Status: DC | PRN
  Administered 2012-05-20: 3 mg via INTRAVENOUS

## 2012-05-20 MED ORDER — HYDROCODONE-ACETAMINOPHEN 5-325 MG PO TABS: 1.0000 | ORAL_TABLET | Freq: Four times a day (QID) | ORAL | Status: DC | PRN

## 2012-05-20 MED ORDER — ACETAMINOPHEN 325 MG PO TABS: 650.0000 mg | ORAL_TABLET | ORAL | Status: DC | PRN

## 2012-05-20 MED ORDER — SUCCINYLCHOLINE CHLORIDE 20 MG/ML IJ SOLN
INTRAMUSCULAR | Status: DC | PRN
  Administered 2012-05-20: 140 mg via INTRAVENOUS

## 2012-05-20 MED ORDER — DOCUSATE SODIUM 100 MG PO CAPS
100.0000 mg | ORAL_CAPSULE | Freq: Two times a day (BID) | ORAL | Status: DC
  Administered 2012-05-20 – 2012-05-21 (×2): 100 mg via ORAL
  Filled 2012-05-20 (×3): qty 1

## 2012-05-20 MED ORDER — STERILE WATER FOR IRRIGATION IR SOLN
Status: DC | PRN
  Administered 2012-05-20: 3000 mL

## 2012-05-20 MED ORDER — EPHEDRINE SULFATE 50 MG/ML IJ SOLN
INTRAMUSCULAR | Status: DC | PRN
  Administered 2012-05-20: 10 mg via INTRAVENOUS

## 2012-05-20 MED ORDER — ALBUTEROL SULFATE HFA 108 (90 BASE) MCG/ACT IN AERS
2.0000 | INHALATION_SPRAY | Freq: Two times a day (BID) | RESPIRATORY_TRACT | Status: DC | PRN
  Filled 2012-05-20: qty 6.7

## 2012-05-20 MED ORDER — PROMETHAZINE HCL 25 MG/ML IJ SOLN: 6.2500 mg | INTRAMUSCULAR | Status: DC | PRN

## 2012-05-20 MED ORDER — OXYCODONE HCL 5 MG PO TABS: 5.0000 mg | ORAL_TABLET | Freq: Once | ORAL | Status: DC | PRN

## 2012-05-20 MED ORDER — PROPOFOL 10 MG/ML IV EMUL
INTRAVENOUS | Status: DC | PRN
  Administered 2012-05-20: 180 mg via INTRAVENOUS

## 2012-05-20 MED ORDER — SIMVASTATIN 40 MG PO TABS
40.0000 mg | ORAL_TABLET | Freq: Every day | ORAL | Status: DC
  Administered 2012-05-20: 40 mg via ORAL
  Filled 2012-05-20 (×2): qty 1

## 2012-05-20 MED ORDER — INDIGOTINDISULFONATE SODIUM 8 MG/ML IJ SOLN
INTRAMUSCULAR | Status: DC | PRN
  Administered 2012-05-20 (×2): 5 mL via INTRAVENOUS

## 2012-05-20 MED ORDER — CEFAZOLIN SODIUM 1-5 GM-% IV SOLN
1.0000 g | Freq: Three times a day (TID) | INTRAVENOUS | Status: AC
  Administered 2012-05-20 (×2): 1 g via INTRAVENOUS
  Filled 2012-05-20 (×2): qty 50

## 2012-05-20 MED ORDER — ACETAMINOPHEN 10 MG/ML IV SOLN
INTRAVENOUS | Status: DC | PRN
  Administered 2012-05-20: 1000 mg via INTRAVENOUS

## 2012-05-20 MED ORDER — BUPIVACAINE-EPINEPHRINE 0.25% -1:200000 IJ SOLN
INTRAMUSCULAR | Status: DC | PRN
  Administered 2012-05-20: 30 mL

## 2012-05-20 MED ORDER — KETOROLAC TROMETHAMINE 15 MG/ML IJ SOLN
15.0000 mg | Freq: Four times a day (QID) | INTRAMUSCULAR | Status: AC
  Administered 2012-05-20 – 2012-05-21 (×6): 15 mg via INTRAVENOUS
  Filled 2012-05-20 (×6): qty 1

## 2012-05-20 MED ORDER — LIDOCAINE HCL (CARDIAC) 20 MG/ML IV SOLN
INTRAVENOUS | Status: DC | PRN
  Administered 2012-05-20: 20 mg via INTRAVENOUS

## 2012-05-20 SURGICAL SUPPLY — 43 items
CANISTER SUCTION 2500CC (MISCELLANEOUS) ×2 IMPLANT
CATH FOLEY 2WAY SLVR 18FR 30CC (CATHETERS) ×2 IMPLANT
CATH ROBINSON RED A/P 16FR (CATHETERS) ×2 IMPLANT
CATH ROBINSON RED A/P 8FR (CATHETERS) ×2 IMPLANT
CATH TIEMANN FOLEY 18FR 5CC (CATHETERS) ×2 IMPLANT
CHLORAPREP W/TINT 26ML (MISCELLANEOUS) ×2 IMPLANT
CLIP LIGATING HEM O LOK PURPLE (MISCELLANEOUS) ×4 IMPLANT
CLOTH BEACON ORANGE TIMEOUT ST (SAFETY) ×2 IMPLANT
CORD HIGH FREQUENCY UNIPOLAR (ELECTROSURGICAL) ×2 IMPLANT
COVER SURGICAL LIGHT HANDLE (MISCELLANEOUS) ×2 IMPLANT
COVER TIP SHEARS 8 DVNC (MISCELLANEOUS) ×1 IMPLANT
COVER TIP SHEARS 8MM DA VINCI (MISCELLANEOUS) ×1
CUTTER ECHEON FLEX ENDO 45 340 (ENDOMECHANICALS) ×2 IMPLANT
DECANTER SPIKE VIAL GLASS SM (MISCELLANEOUS) ×2 IMPLANT
DRAPE SURG IRRIG POUCH 19X23 (DRAPES) ×2 IMPLANT
DRSG TEGADERM 2-3/8X2-3/4 SM (GAUZE/BANDAGES/DRESSINGS) ×8 IMPLANT
DRSG TEGADERM 4X4.75 (GAUZE/BANDAGES/DRESSINGS) ×4 IMPLANT
DRSG TEGADERM 6X8 (GAUZE/BANDAGES/DRESSINGS) ×4 IMPLANT
ELECT REM PT RETURN 9FT ADLT (ELECTROSURGICAL) ×2
ELECTRODE REM PT RTRN 9FT ADLT (ELECTROSURGICAL) ×1 IMPLANT
GAUZE SPONGE 2X2 8PLY STRL LF (GAUZE/BANDAGES/DRESSINGS) ×1 IMPLANT
GLOVE BIO SURGEON STRL SZ 6.5 (GLOVE) ×2 IMPLANT
GLOVE BIOGEL M STRL SZ7.5 (GLOVE) ×4 IMPLANT
GOWN STRL NON-REIN LRG LVL3 (GOWN DISPOSABLE) ×6 IMPLANT
GOWN STRL REIN XL XLG (GOWN DISPOSABLE) ×4 IMPLANT
HOLDER FOLEY CATH W/STRAP (MISCELLANEOUS) ×2 IMPLANT
KIT ACCESSORY DA VINCI DISP (KITS) ×1
KIT ACCESSORY DVNC DISP (KITS) ×1 IMPLANT
NDL SAFETY ECLIPSE 18X1.5 (NEEDLE) ×1 IMPLANT
NEEDLE HYPO 18GX1.5 SHARP (NEEDLE) ×2
PACK ROBOT UROLOGY CUSTOM (CUSTOM PROCEDURE TRAY) ×2 IMPLANT
RELOAD GREEN ECHELON 45 (STAPLE) ×2 IMPLANT
SET TUBE IRRIG SUCTION NO TIP (IRRIGATION / IRRIGATOR) ×2 IMPLANT
SOLUTION ELECTROLUBE (MISCELLANEOUS) ×2 IMPLANT
SPONGE GAUZE 2X2 STER 10/PKG (GAUZE/BANDAGES/DRESSINGS) ×1
SUT ETHILON 3 0 PS 1 (SUTURE) ×2 IMPLANT
SUT VIC AB 2-0 SH 27 (SUTURE) ×1
SUT VIC AB 2-0 SH 27X BRD (SUTURE) ×1 IMPLANT
SUT VICRYL 0 UR6 27IN ABS (SUTURE) ×4 IMPLANT
SYR 27GX1/2 1ML LL SAFETY (SYRINGE) ×2 IMPLANT
TOWEL NATURAL 6PK STERILE (DISPOSABLE) ×2 IMPLANT
TOWEL OR NON WOVEN STRL DISP B (DISPOSABLE) ×2 IMPLANT
WATER STERILE IRR 1500ML POUR (IV SOLUTION) ×4 IMPLANT

## 2012-05-20 NOTE — Op Note (Signed)
Preoperative diagnosis: Clinically localized adenocarcinoma of the prostate (clinical stage cT1c Nx Mx)  Postoperative diagnosis: Clinically localized adenocarcinoma of the prostate (clinical stage cT1c Nx Mx)  Procedure:  1. Robotic assisted laparoscopic radical prostatectomy (bilateral nerve sparing)  Surgeon: Roxy Horseman, Brooke Bonito. M.D.  Assistant: Leta Baptist, PA-C  Anesthesia: General  Complications: None  EBL: 50 mL  IVF:  900 mL crystalloid  Specimens: 1. Prostate and seminal vesicles  Disposition of specimens: Pathology  Drains: 1. 20 Fr coude catheter 2. # 19 Blake pelvic drain  Indication: Jose Warner is a 62 y.o. year old patient with clinically localized prostate cancer.  After a thorough review of the management options for treatment of prostate cancer, he elected to proceed with surgical therapy and the above procedure(s).  We have discussed the potential benefits and risks of the procedure, side effects of the proposed treatment, the likelihood of the patient achieving the goals of the procedure, and any potential problems that might occur during the procedure or recuperation. Informed consent has been obtained.  Description of procedure:  The patient was taken to the operating room and a general anesthetic was administered. He was given preoperative antibiotics, placed in the dorsal lithotomy position, and prepped and draped in the usual sterile fashion. Next a preoperative timeout was performed. A urethral catheter was placed into the bladder and a site was selected near the umbilicus for placement of the camera port. This was placed using a standard open Hassan technique which allowed entry into the peritoneal cavity under direct vision and without difficulty. A 12 mm port was placed and a pneumoperitoneum established. The camera was then used to inspect the abdomen and there was no evidence of any intra-abdominal injuries or other abnormalities. The  remaining abdominal ports were then placed. 8 mm robotic ports were placed in the right lower quadrant, left lower quadrant, and far left lateral abdominal wall. A 5 mm port was placed in the right upper quadrant and a 12 mm port was placed in the right lateral abdominal wall for laparoscopic assistance. All ports were placed under direct vision without difficulty. The surgical cart was then docked.   Utilizing the cautery scissors, the bladder was reflected posteriorly allowing entry into the space of Retzius and identification of the endopelvic fascia and prostate. The periprostatic fat was then removed from the prostate allowing full exposure of the endopelvic fascia. The endopelvic fascia was then incised from the apex back to the base of the prostate bilaterally and the underlying levator muscle fibers were swept laterally off the prostate thereby isolating the dorsal venous complex. The dorsal vein was then stapled and divided with a 45 mm Flex Echelon stapler. Attention then turned to the bladder neck which was divided anteriorly thereby allowing entry into the bladder and exposure of the urethral catheter. The catheter balloon was deflated and the catheter was brought into the operative field and used to retract the prostate anteriorly. The posterior bladder neck was then examined.  He was noted to have a TUR defect and the ureteral orifices were easily identified and well away from the bladder neck. The bladder neck was divided allowing further dissection between the bladder and prostate posteriorly until the vasa deferentia and seminal vessels were identified. The vasa deferentia were isolated, divided, and lifted anteriorly. The seminal vesicles were dissected down to their tips with care to control the seminal vascular arterial blood supply. These structures were then lifted anteriorly and the space between Denonvillier's fascia and  the anterior rectum was developed with a combination of sharp and  blunt dissection. This isolated the vascular pedicles of the prostate.  The lateral prostatic fascia was then sharply incised allowing release of the neurovascular bundles bilaterally. The vascular pedicles of the prostate were then ligated with Weck clips between the prostate and neurovascular bundles and divided with sharp cold scissor dissection resulting in neurovascular bundle preservation. The neurovascular bundles were then separated off the apex of the prostate and urethra bilaterally.  The urethra was then sharply transected allowing the prostate specimen to be disarticulated. The pelvis was copiously irrigated and hemostasis was ensured. There was no evidence for rectal injury.  Attention then turned to the urethral anastomosis. A 2-0 Vicryl slip knot was placed between Denonvillier's fascia, the posterior bladder neck, and the posterior urethra to reapproximate these structures. A double-armed 3-0 Monocryl suture was then used to perform a 360 running tension-free anastomosis between the bladder neck and urethra. A new urethral catheter was then placed into the bladder and irrigated. There were no blood clots within the bladder and the anastomosis appeared to be watertight. A #19 Blake drain was then brought through the left lateral 8 mm port site and positioned appropriately within the pelvis. It was secured to the skin with a nylon suture. The surgical cart was then undocked. The right lateral 12 mm port site was closed at the fascial level with a 0 Vicryl suture placed laparoscopically. All remaining ports were then removed under direct vision. The prostate specimen was removed intact within the Endopouch retrieval bag via the periumbilical camera port site. This fascial opening was closed with two running 0 Vicryl sutures. 0.25% Marcaine was then injected into all port sites and all incisions were reapproximated at the skin level with staples. Sterile dressings were applied. The patient  appeared to tolerate the procedure well and without complications. The patient was able to be extubated and transferred to the recovery unit in satisfactory condition.  Pryor Curia MD

## 2012-05-20 NOTE — Interval H&P Note (Signed)
History and Physical Interval Note:  05/20/2012 7:11 AM  Jose Warner  has presented today for surgery, with the diagnosis of PROSTATE CANCER  The various methods of treatment have been discussed with the patient and family. After consideration of risks, benefits and other options for treatment, the patient has consented to  Procedure(s) (LRB) with comments: ROBOTIC ASSISTED LAPAROSCOPIC RADICAL PROSTATECTOMY LEVEL 1 (N/A) -   as a surgical intervention .  The patient's history has been reviewed, patient examined, no change in status, stable for surgery.  I have reviewed the patient's chart and labs.  Questions were answered to the patient's satisfaction.     [name],LES

## 2012-05-20 NOTE — Transfer of Care (Signed)
Immediate Anesthesia Transfer of Care Note  Patient: Jose Warner  Procedure(s) Performed: Procedure(s) (LRB) with comments: ROBOTIC ASSISTED LAPAROSCOPIC RADICAL PROSTATECTOMY LEVEL 1 (N/A) -    Patient Location: PACU  Anesthesia Type:General  Level of Consciousness: awake, sedated and patient cooperative  Airway & Oxygen Therapy: Patient Spontanous Breathing and Patient connected to face mask oxygen  Post-op Assessment: Report given to PACU RN and Post -op Vital signs reviewed and stable  Post vital signs: Reviewed and stable  Complications: No apparent anesthesia complications

## 2012-05-20 NOTE — Progress Notes (Signed)
Patient ID: Jose Warner, male   DOB: 08/01/1950, 62 y.o.   MRN: [id number] Post-op note  Subjective: The patient is doing well.  No complaints.  Mild nausea earlier but resolved quickly.  Objective: Vital signs in last 24 hours: Temp:  [94.7 F (34.8 C)-97.8 F (36.6 C)] 97.1 F (36.2 C) (01/30 1430) Pulse Rate:  [58-85] 70  (01/30 1430) Resp:  [12-19] 16  (01/30 1430) BP: (122-166)/(64-84) 122/64 mmHg (01/30 1430) SpO2:  [99 %-100 %] 99 % (01/30 1430) Weight:  [78.472 kg (173 lb)] 78.472 kg (173 lb) (01/30 1053)  Intake/Output from previous day:   Intake/Output this shift: Total I/O In: 3000 [I.V.:2000; IV Piggyback:1000] Out: 710 [Urine:610; Drains:50; Blood:50]  Physical Exam:  General: Alert and oriented. Abdomen: Soft, Nondistended. Incisions: Clean and dry.  Lab Results:  Basename 05/20/12 0941  HGB 13.4  HCT 41.9    Assessment/Plan: POD#0   1) Continue to monitor 2) Amb, IS, DVT prophy    LOS: 0 days   Silas Flood. 05/20/2012, 2:36 PM

## 2012-05-20 NOTE — Anesthesia Postprocedure Evaluation (Signed)
Anesthesia Post Note  Patient: Jose Warner  Procedure(s) Performed: Procedure(s) (LRB): ROBOTIC ASSISTED LAPAROSCOPIC RADICAL PROSTATECTOMY LEVEL 1 (N/A)  Anesthesia type: General  Patient location: PACU  Post pain: Pain level controlled  Post assessment: Post-op Vital signs reviewed  Last Vitals: BP 148/84  Pulse 71  Temp 34.8 C (Oral)  Resp 15  SpO2 99%  Post vital signs: Reviewed  Level of consciousness: sedated  Complications: No apparent anesthesia complications

## 2012-05-21 ENCOUNTER — Encounter (HOSPITAL_COMMUNITY): Payer: Self-pay | Admitting: Urology

## 2012-05-21 MED ORDER — BISACODYL 10 MG RE SUPP
10.0000 mg | Freq: Once | RECTAL | Status: AC
  Administered 2012-05-21: 10 mg via RECTAL
  Filled 2012-05-21: qty 1

## 2012-05-21 MED ORDER — HYDROCODONE-ACETAMINOPHEN 5-325 MG PO TABS: 1.0000 | ORAL_TABLET | Freq: Four times a day (QID) | ORAL | Status: DC | PRN

## 2012-05-21 NOTE — Discharge Summary (Signed)
  Date of admission: 05/20/2012  Date of discharge: 05/21/2012  Admission diagnosis: Prostate Cancer  Discharge diagnosis: Prostate Cancer  History and Physical: For full details, please see admission history and physical. Briefly, Jose Warner is a 62 y.o. gentleman with localized prostate cancer.  After discussing management/treatment options, he elected to proceed with surgical treatment.  Hospital Course: [name] was taken to the operating room on 05/20/2012 and underwent a robotic assisted laparoscopic radical prostatectomy. He tolerated this procedure well and without complications. Postoperatively, he was able to be transferred to a regular hospital room following recovery from anesthesia.  He was able to begin ambulating the night of surgery. He remained hemodynamically stable overnight.  He had excellent urine output with appropriately minimal output from his pelvic drain and his pelvic drain was removed on POD #1.  He was transitioned to oral pain medication, tolerated a clear liquid diet, and had met all discharge criteria and was able to be discharged home later on POD#1.  Laboratory values:  Basename 05/21/12 0503 05/20/12 0941  HGB 12.4* 13.4  HCT 37.9* 41.9    Disposition: Home  Discharge instruction: He was instructed to be ambulatory but to refrain from heavy lifting, strenuous activity, or driving. He was instructed on urethral catheter care.  Discharge medications:     Medication List     As of 05/21/2012 12:33 PM    START taking these medications         ciprofloxacin 500 MG tablet   Commonly known as: CIPRO   Take 1 tablet (500 mg total) by mouth 2 (two) times daily. Start day prior to office visit for foley removal      HYDROcodone-acetaminophen 5-325 MG per tablet   Commonly known as: NORCO/VICODIN   Take 1-2 tablets by mouth every 6 (six) hours as needed for pain.      CONTINUE taking these medications         niacin 500 MG CR tablet   Commonly known as: NIASPAN      PROAIR HFA 108 (90 BASE) MCG/ACT inhaler   Generic drug: albuterol      simvastatin 40 MG tablet   Commonly known as: ZOCOR      STOP taking these medications         aspirin EC 81 MG tablet      ibuprofen 200 MG tablet   Commonly known as: ADVIL,MOTRIN      multivitamin with minerals Tabs      OVER THE COUNTER MEDICATION      Tamsulosin HCl 0.4 MG Caps   Commonly known as: FLOMAX      Vitamin D3 2000 UNITS Tabs          Where to get your medications    These are the prescriptions that you need to pick up.   You may get these medications from any pharmacy.         ciprofloxacin 500 MG tablet   HYDROcodone-acetaminophen 5-325 MG per tablet            Followup: He will followup in 1 week for catheter removal and to discuss his surgical pathology results.

## 2012-05-21 NOTE — Progress Notes (Signed)
Patient given education and switched foley to leg drainage bag for discharge. Patient will be transported home by wife. Erskin Burnet RN

## 2012-05-21 NOTE — Progress Notes (Signed)
Patient ID: Jose Warner, male   DOB: 18-May-1950, 62 y.o.   MRN: [id number]  1 Day Post-Op Subjective: The patient is doing well.  No nausea or vomiting. Pain is adequately controlled. He complains of some sinus drainage and pressure but no cough, SOB, CP, or fever.  Objective: Vital signs in last 24 hours: Temp:  [94.7 F (34.8 C)-99.5 F (37.5 C)] 98.3 F (36.8 C) (01/31 0623) Pulse Rate:  [58-84] 73  (01/31 0623) Resp:  [12-19] 18  (01/31 0623) BP: (117-166)/(61-84) 139/61 mmHg (01/31 0623) SpO2:  [97 %-100 %] 98 % (01/31 0623) Weight:  [78.472 kg (173 lb)] 78.472 kg (173 lb) (01/30 1053)  Intake/Output from previous day: 01/30 0701 - 01/31 0700 In: 6227.5 [P.O.:240; I.V.:4937.5; IV Piggyback:1050] Out: 3670 [Urine:3510; Drains:110; Blood:50] Intake/Output this shift:    Physical Exam:  General: Alert and oriented. CV: RRR Lungs: Clear bilaterally. GI: Soft, Nondistended. Incisions: Dressings intact. Urine: Clear Extremities: Nontender, no erythema, no edema.  Lab Results:  Basename 05/21/12 0503 05/20/12 0941  HGB 12.4* 13.4  HCT 37.9* 41.9      Assessment/Plan: POD# 1 s/p robotic prostatectomy.  1) SL IVF 2) Ambulate, Incentive spirometry 3) Transition to oral pain medication 4) Dulcolax suppository 5) D/C pelvic drain 6) Plan for likely discharge later today   Pryor Curia. MD   LOS: 1 day   Dandra Velardi,LES 05/21/2012, 7:31 AM

## 2012-07-16 ENCOUNTER — Other Ambulatory Visit (HOSPITAL_COMMUNITY): Payer: Self-pay | Admitting: Cardiovascular Disease

## 2012-07-16 DIAGNOSIS — I4891 Unspecified atrial fibrillation: Secondary | ICD-10-CM

## 2012-07-30 ENCOUNTER — Ambulatory Visit (HOSPITAL_COMMUNITY)
Admission: RE | Admit: 2012-07-30 | Discharge: 2012-07-30 | Disposition: A | Discharge status: Home / Self Care | Payer: BC Managed Care – PPO | Source: Ambulatory Visit | Attending: Cardiovascular Disease | Admitting: Cardiovascular Disease

## 2012-07-30 ENCOUNTER — Encounter: Payer: Self-pay | Admitting: Cardiovascular Disease

## 2012-07-30 DIAGNOSIS — I4891 Unspecified atrial fibrillation: Secondary | ICD-10-CM

## 2012-07-30 NOTE — Progress Notes (Signed)
[facility]   2D echo completed 07/30/2012.   Cindy Keirstyn Aydt, RDCS  

## 2012-11-16 ENCOUNTER — Ambulatory Visit: Payer: BC Managed Care – PPO | Admitting: Cardiovascular Disease

## 2012-11-26 ENCOUNTER — Ambulatory Visit (INDEPENDENT_AMBULATORY_CARE_PROVIDER_SITE_OTHER): Payer: BC Managed Care – PPO | Admitting: Cardiovascular Disease

## 2012-11-26 ENCOUNTER — Encounter: Payer: Self-pay | Admitting: Cardiovascular Disease

## 2012-11-26 VITALS — BP 118/62 | HR 56 | Ht 67.0 in | Wt 171.9 lb

## 2012-11-26 DIAGNOSIS — I2510 Atherosclerotic heart disease of native coronary artery without angina pectoris: Secondary | ICD-10-CM | POA: Insufficient documentation

## 2012-11-26 DIAGNOSIS — I4891 Unspecified atrial fibrillation: Secondary | ICD-10-CM

## 2012-11-26 DIAGNOSIS — Z8546 Personal history of malignant neoplasm of prostate: Secondary | ICD-10-CM

## 2012-11-26 DIAGNOSIS — Z8249 Family history of ischemic heart disease and other diseases of the circulatory system: Secondary | ICD-10-CM | POA: Insufficient documentation

## 2012-11-26 DIAGNOSIS — I480 Paroxysmal atrial fibrillation: Secondary | ICD-10-CM

## 2012-11-26 DIAGNOSIS — E786 Lipoprotein deficiency: Secondary | ICD-10-CM

## 2012-11-26 MED ORDER — SIMVASTATIN 40 MG PO TABS: 40.0000 mg | ORAL_TABLET | Freq: Every day | ORAL | Status: DC

## 2012-11-26 MED ORDER — NIACIN ER (ANTIHYPERLIPIDEMIC) 500 MG PO TBCR: 500.0000 mg | EXTENDED_RELEASE_TABLET | Freq: Every day | ORAL | Status: DC

## 2012-11-26 MED ORDER — METOPROLOL SUCCINATE ER 25 MG PO TB24: 25.0000 mg | ORAL_TABLET | Freq: Every day | ORAL | Status: DC

## 2012-11-26 NOTE — Progress Notes (Addendum)
Patient ID: Jose Warner, male   DOB: Aug 30, 1950, 62 y.o.   MRN: [id number]     HPI: Jose Warner, is a 62 y.o. male who presents for cardiology followup evaluation.  [name] family history for premature coronary artery disease. In May 2006 cardiac catheterization showed very mild LAD and circumflex narrowing felt to be not flow limiting at approximately 20-30% has a history of low HDL levels. He developed focal prostate cancer and underwent robotic prostatectomy by Dr. Araceli Bouche earlier this year. When I saw him after his prostate surgery in March he was in atrial fibrillation at which time he was completely unaware of this rhythm disturbance. He was started on eloquence 5 mg twice a day anticoagulation and as well as metoprolol succinate. He subsequently converted spontaneously to sinus rhythm and has been maintaining sinus rhythm since. He remains active. He exercises daily. He denies any chest pain. He denies shortness of breath. He denies palpitations. His echo Doppler study showed an ejection fraction in the 55-65% range. He had  upper normal LA size. There is mild aortic insufficiency. Normal lung pressures with an estimated RV systolic pressure 15 mm.  In the past, he had been on Niaspan for low HDL levels. This was stopped when he was also put on cialis following his prostate surgery. He no longer takes cialis.  He did have recent blood work done in April showed cholesterol 131 triglycerides 96 his HDL remains very low at 23 and his LDL was 89. Thyroid function studies were normal as were his hemoglobin hematocrit and chemistries.   Past Medical History  Diagnosis Date  . Cancer 05-10-12    ;bx. 04-12-12, dx. Prostate cancer  . Bronchitis 05-10-12    past fall- 4 runs antibiotics due to bronchitis-uses [employer] as needed  . Hepatitis 05-10-12    "was told non A, non B"-unclean dental equip.    Past Surgical History  Procedure Laterality Date  . Hernia repair  5 yrs ago   right   . Transurethral resection of prostate  39yr ago  . Robot assisted laparoscopic radical prostatectomy  05/20/2012    Procedure: ROBOTIC ASSISTED LAPAROSCOPIC RADICAL PROSTATECTOMY LEVEL 1;  Surgeon: LDutch Gray MD;  Location: [facility];  Service: Urology;  Laterality: N/A;       Allergies  Allergen Reactions  . Sulfa Antibiotics Rash    Current Outpatient Prescriptions  Medication Sig Dispense Refill  . apixaban (ELIQUIS) 5 MG TABS tablet Take 5 mg by mouth 2 (two) times daily.      . metoprolol succinate (TOPROL-XL) 50 MG 24 hr tablet Take 50 mg by mouth daily. Take 1/2 tablet.      . niacin (NIASPAN) 500 MG CR tablet Take 1 tablet (500 mg total) by mouth at bedtime.  90 tablet  3  . PROAIR HFA 108 (90 BASE) MCG/ACT inhaler Take 2 puffs by mouth 2 (two) times daily as needed. For shortness of breath.      . simvastatin (ZOCOR) 40 MG tablet Take 1 tablet (40 mg total) by mouth at bedtime.  90 tablet  3  . metoprolol succinate (TOPROL-XL) 25 MG 24 hr tablet Take 1 tablet (25 mg total) by mouth daily.  90 tablet  3   No current facility-administered medications for this visit.    History   Social History  . Marital Status: Married    Spouse Name: N/A    Number of Children: N/A  . Years of Education: N/A  Occupational History  . Not on file.   Social History Main Topics  . Smoking status: Never Smoker   . Smokeless tobacco: Never Used  . Alcohol Use: 1.2 oz/week    2 Glasses of wine per week     Comment: occ. x 2 drinks weekly  . Drug Use: No  . Sexually Active: Yes   Other Topics Concern  . Not on file   Social History Narrative  . No narrative on file      ROS is negative for fevers, chills or night sweats. He denies chest pain. He denies palpitations. He denies presyncope or syncope. He denies bleeding. He denies abdominal pain. He does have erectile function issues. There is no edema.   Other system review is negative.  PE BP 118/62  Pulse 56  Ht 5' 7"   (1.702 m)  Wt 171 lb 14.4 oz (77.973 kg)  BMI 26.92 kg/m2  General: Alert, oriented, no distress.  Skin: normal turgor, no rashes HEENT: Normocephalic, atraumatic. Pupils round and reactive; sclera anicteric;no lid lag.  Nose without nasal septal hypertrophy Mouth/Parynx benign; Mallinpatti scale 2 Neck: No JVD, no carotid briuts Lungs: clear to ausculatation and percussion; no wheezing or rales Heart: RRR, s1 s2 normal no S3 or S4 gallop. 1/6 aortic insufficiency murmur, unchanged. Abdomen: soft, nontender; no hepatosplenomehaly, BS+; abdominal aorta nontender and not dilated by palpation. Pulses 2+ Extremities: no clubbing cyanosis or edema, Homan's sign negative  Neurologic: grossly nonfocal  ECG: This rhythm at 56 beats per minute without ectopy. Normal intervals.  LABS:  BMET    Component Value Date/Time   NA 137 05/10/2012 1330   K 4.5 05/10/2012 1330   CL 104 05/10/2012 1330   CO2 23 05/10/2012 1330   GLUCOSE 86 05/10/2012 1330   BUN 13 05/10/2012 1330   CREATININE 1.05 05/10/2012 1330   CALCIUM 8.6 05/10/2012 1330   GFRNONAA 75* 05/10/2012 1330   GFRAA 87* 05/10/2012 1330     Hepatic Function Panel  No results found for this basename: prot, albumin, ast, alt, alkphos, bilitot, bilidir, ibili     CBC    Component Value Date/Time   WBC 5.9 05/10/2012 1330   RBC 4.75 05/10/2012 1330   HGB 12.4* 05/21/2012 0503   HCT 37.9* 05/21/2012 0503   PLT 239 05/10/2012 1330   MCV 86.1 05/10/2012 1330   MCH 27.6 05/10/2012 1330   MCHC 32.0 05/10/2012 1330   RDW 13.6 05/10/2012 1330     BNP No results found for this basename: probnp    Lipid Panel  No results found for this basename: chol, trig, hdl, cholhdl, vldl, ldlcalc     RADIOLOGY: No results found.    ASSESSMENT AND PLAN: Mr. Urieta has been maintaining normal sinus rhythm. He apparently did develop postoperative atrial fibrillation. Since he was placed on low-dose beta blocker therapy he has been maintaining sinus  rhythm. He has normal LV function. His left atrium is not dilated. Color pressures are normal. It is now over 5 months since he's been in sinus rhythm. We did have discussion today concerning possible switching to aspirin as opposed to long-term anticoagulation therapy. Or, if he did develop another episode of atrial fibrillation he will then be committed to anticoagulation. Presently, he will discontinue his eliquis and we'll start back on enteric coated ASA 325 milligrams. I also suggested her reinstitution of Niaspan 500 mg at bedtime and take his aspirin 30 minutes prior to this to reduce potential for flushing. He tells  me Dr. Sandi Mariscal will be rechecking laboratory in September as well as that these be sent to my office for my review. I will see him in 6 months for followup Cardiologic evaluation or sooner if problems arise.     Troy Sine, MD, [facility]  11/26/2012 9:21 AM

## 2012-11-26 NOTE — Patient Instructions (Signed)
Your physician has recommended you make the following change in your medication: STOP ELIQUIS. Start asiprin 325 mg. Restart the Niaspan. Your physician recommends that you schedule a follow-up appointment in: 6 MONTHS.

## 2013-01-28 ENCOUNTER — Other Ambulatory Visit (HOSPITAL_COMMUNITY): Payer: Self-pay | Admitting: Family Medicine

## 2013-01-28 DIAGNOSIS — R0989 Other specified symptoms and signs involving the circulatory and respiratory systems: Secondary | ICD-10-CM

## 2013-02-18 ENCOUNTER — Ambulatory Visit (HOSPITAL_COMMUNITY)
Admission: RE | Admit: 2013-02-18 | Discharge: 2013-02-18 | Disposition: A | Discharge status: Home / Self Care | Payer: BC Managed Care – PPO | Source: Ambulatory Visit | Attending: Internal Medicine | Admitting: Internal Medicine

## 2013-02-18 DIAGNOSIS — R0989 Other specified symptoms and signs involving the circulatory and respiratory systems: Secondary | ICD-10-CM | POA: Insufficient documentation

## 2013-02-18 NOTE — Progress Notes (Signed)
Carotid Duplex Completed. Jose Warner, BS, RDMS, RVT  

## 2013-05-16 ENCOUNTER — Other Ambulatory Visit: Payer: Self-pay | Admitting: Family Medicine

## 2013-05-16 ENCOUNTER — Ambulatory Visit
Admission: RE | Admit: 2013-05-16 | Discharge: 2013-05-16 | Disposition: A | Discharge status: Home / Self Care | Payer: BC Managed Care – PPO | Source: Ambulatory Visit | Attending: Family Medicine | Admitting: Family Medicine

## 2013-05-16 DIAGNOSIS — R05 Cough: Secondary | ICD-10-CM

## 2013-05-16 DIAGNOSIS — R059 Cough, unspecified: Secondary | ICD-10-CM

## 2013-05-16 DIAGNOSIS — R509 Fever, unspecified: Secondary | ICD-10-CM

## 2013-06-08 ENCOUNTER — Encounter: Payer: Self-pay | Admitting: Cardiovascular Disease

## 2013-06-08 ENCOUNTER — Ambulatory Visit (INDEPENDENT_AMBULATORY_CARE_PROVIDER_SITE_OTHER): Payer: BC Managed Care – PPO | Admitting: Cardiovascular Disease

## 2013-06-08 VITALS — BP 110/70 | HR 56 | Ht 68.0 in | Wt 172.2 lb

## 2013-06-08 DIAGNOSIS — R002 Palpitations: Secondary | ICD-10-CM

## 2013-06-08 DIAGNOSIS — E786 Lipoprotein deficiency: Secondary | ICD-10-CM

## 2013-06-08 DIAGNOSIS — N5231 Erectile dysfunction following radical prostatectomy: Secondary | ICD-10-CM | POA: Insufficient documentation

## 2013-06-08 DIAGNOSIS — Z8249 Family history of ischemic heart disease and other diseases of the circulatory system: Secondary | ICD-10-CM

## 2013-06-08 DIAGNOSIS — N529 Male erectile dysfunction, unspecified: Secondary | ICD-10-CM

## 2013-06-08 DIAGNOSIS — I2510 Atherosclerotic heart disease of native coronary artery without angina pectoris: Secondary | ICD-10-CM

## 2013-06-08 DIAGNOSIS — Z8546 Personal history of malignant neoplasm of prostate: Secondary | ICD-10-CM

## 2013-06-08 DIAGNOSIS — I480 Paroxysmal atrial fibrillation: Secondary | ICD-10-CM

## 2013-06-08 DIAGNOSIS — I4891 Unspecified atrial fibrillation: Secondary | ICD-10-CM

## 2013-06-08 MED ORDER — NEBIVOLOL HCL 5 MG PO TABS: 5.0000 mg | ORAL_TABLET | Freq: Every day | ORAL | Status: DC

## 2013-06-08 NOTE — Patient Instructions (Signed)
Your physician recommends that you schedule a follow-up appointment in: 3 Months  Your physician has recommended you make the following change in your medication: STOP Metoprolol and START Bystolic 5 mg daily

## 2013-06-08 NOTE — Progress Notes (Signed)
Patient ID: Jose Warner, male   DOB: Jun 12, 1950, 63 y.o.   MRN: [id number]      HPI: Jose Warner, is a 63 y.o. male who presents for a 6 monthcardiology followup evaluation.  Jose Warner has a strong family history for premature coronary artery disease. In May 2006 cardiac catheterization showed very mild LAD and circumflex narrowing felt to be not flow limiting at approximately 20-30%. He has a history of low HDL levels. He developed focal prostate cancer and underwent robotic prostatectomy by Dr. Araceli Bouche in 2014 When I saw him after his prostate surgery in March 2014 he was in atrial fibrillation at which time he was completely unaware of this rhythm disturbance. He was started on eliquis 5 mg twice a day anticoagulation and as well as metoprolol succinate. He subsequently converted spontaneously to sinus rhythm and has been maintaining sinus rhythm since. He remains active. He exercises daily. He denies any chest pain. He denies shortness of breath. He denies palpitations. His echo Doppler study showed an ejection fraction in the 55-65% range. He had  upper normal LA size. There is mild aortic insufficiency. Pulmonary pressures were normal with an estimated RV systolic pressure 15 mm.  In the past, he had been on Niaspan for low HDL levels. This was stopped when he was also put on cialis following his prostate surgery. He no longer takes cialis.  He did have recent blood work done in April 2014 showed cholesterol 131 triglycerides 96 his HDL remains very low at 23 and his LDL was 89. Thyroid function studies were normal as were his hemoglobin hematocrit and chemistries.  Since his surgery, he is to take you to have difficulty with erectile dysfunction. He will be seeing Dr. Leonides Schanz in followup within the month. He recently had his metoprolol sent to him from his mail order pharmacy and apparently they sent him metoprolol tartrate rather than succinate. He is unaware of any further atrial  fibrillation episodes. He recently saw Dr. Ihor Austin who mentioned to him that I may want to changes medication which may improve his fatigue and possible erectile function. He presents for evaluation.   Past Medical History  Diagnosis Date  . Cancer 05-10-12    ;bx. 04-12-12, dx. Prostate cancer  . Bronchitis 05-10-12    past fall- 4 runs antibiotics due to bronchitis-uses [employer] as needed  . Hepatitis 05-10-12    "was told non A, non B"-unclean dental equip.    Past Surgical History  Procedure Laterality Date  . Hernia repair  5 yrs ago    right   . Transurethral resection of prostate  5yr ago  . Robot assisted laparoscopic radical prostatectomy  05/20/2012    Procedure: ROBOTIC ASSISTED LAPAROSCOPIC RADICAL PROSTATECTOMY LEVEL 1;  Surgeon: LDutch Gray MD;  Location: [facility];  Service: Urology;  Laterality: N/A;       Allergies  Allergen Reactions  . Sulfa Antibiotics Rash    Current Outpatient Prescriptions  Medication Sig Dispense Refill  . aspirin 325 MG tablet Take 325 mg by mouth daily.      . niacin (NIASPAN) 500 MG CR tablet Take 1 tablet (500 mg total) by mouth at bedtime.  90 tablet  3  . PROAIR HFA 108 (90 BASE) MCG/ACT inhaler Take 2 puffs by mouth 2 (two) times daily as needed. For shortness of breath.      . simvastatin (ZOCOR) 40 MG tablet Take 1 tablet (40 mg total) by mouth at bedtime.  9[place]  tablet  3  . nebivolol (BYSTOLIC) 5 MG tablet Take 1 tablet (5 mg total) by mouth daily.  30 tablet  6   No current facility-administered medications for this visit.    History   Social History  . Marital Status: Married    Spouse Name: N/A    Number of Children: N/A  . Years of Education: N/A   Occupational History  . Not on file.   Social History Main Topics  . Smoking status: Never Smoker   . Smokeless tobacco: Never Used  . Alcohol Use: 1.2 oz/week    2 Glasses of wine per week     Comment: occ. x 2 drinks weekly  . Drug Use: No  . Sexual Activity: Yes    Other Topics Concern  . Not on file   Social History Narrative  . No narrative on file      ROS is negative for fevers, chills or night sweats. He denies change in vision or hearing. He denies lymphadenopathy. He denies wheezing, cough, or increased sputum production. He denies chest pain. He denies palpitations. He denies presyncope or syncope. He denies abdominal pain, nausea or vomiting. He is unaware of blood in stool or urine. He denies bleeding. He has not been able to have an erection since his robotic prostatectomy. He denies claudication. He denies tremors. He denies thyroid issues or diabetes. He is unaware of sleep issues. Other comprehensive 14 point system review is negative.   PE BP 110/70  Pulse 56  Ht 5' 8"  (1.727 m)  Wt 172 lb 3.2 oz (78.109 kg)  BMI 26.19 kg/m2  General: Alert, oriented, no distress.  Skin: normal turgor, no rashes HEENT: Normocephalic, atraumatic. Pupils round and reactive; sclera anicteric;no lid lag.  Nose without nasal septal hypertrophy Mouth/Parynx benign; Mallinpatti scale 2 Neck: No JVD, no carotid bruits with normal carotid upstroke Lungs: clear to ausculatation and percussion; no wheezing or rales Chest wall: No tenderness to palpation. Heart: RRR, s1 s2 normal no S3 or S4 gallop. 1/6 aortic insufficiency murmur, unchanged. No S3 or S4 gallop. No rubs thrills or heaves. Abdomen: soft, nontender; no hepatosplenomehaly, BS+; abdominal aorta nontender and not dilated by palpation. Back: No CVA tenderness Pulses 2+ Extremities: no clubbing cyanosis or edema, Homan's sign negative  Neurologic: grossly nonfocal Psychological: Normal affect and mood  ECG: (independently read by me): Sinus rhythm at 56 beats per minute. No ectopy. Normal intervals.  LABS:  BMET    Component Value Date/Time   NA 137 05/10/2012 1330   K 4.5 05/10/2012 1330   CL 104 05/10/2012 1330   CO2 23 05/10/2012 1330   GLUCOSE 86 05/10/2012 1330   BUN 13 05/10/2012  1330   CREATININE 1.05 05/10/2012 1330   CALCIUM 8.6 05/10/2012 1330   GFRNONAA 75* 05/10/2012 1330   GFRAA 87* 05/10/2012 1330     Hepatic Function Panel  No results found for this basename: prot,  albumin,  ast,  alt,  alkphos,  bilitot,  bilidir,  ibili     CBC    Component Value Date/Time   WBC 5.9 05/10/2012 1330   RBC 4.75 05/10/2012 1330   HGB 12.4* 05/21/2012 0503   HCT 37.9* 05/21/2012 0503   PLT 239 05/10/2012 1330   MCV 86.1 05/10/2012 1330   MCH 27.6 05/10/2012 1330   MCHC 32.0 05/10/2012 1330   RDW 13.6 05/10/2012 1330     BNP No results found for this basename: probnp    Lipid Panel  No results found for this basename: chol,  trig,  hdl,  cholhdl,  vldl,  ldlcalc     RADIOLOGY: No results found.    ASSESSMENT AND PLAN: Mr. Murri has been maintaining normal sinus rhythm since beta blocker was started after he developed late postoperative atrial fibrillation following his prostatectomy. He has normal LV function. His left atrium is not dilated. He is now off a lot with anticoagulation and takes aspirin. Dr. Deirdre Peer and has been checking his laboratory and I will try to obtain most recent results. He does have some mild fatigability and continues to have a rectal function issues. I will discontinue his recent metoprolol which apparently was tartrate rather than succinate and change this to a systolic 5 mg. I have provided him with samples of this today. He is back on Niaspan for his markedly low HDL level as well as simvastatin 40 mg. He's not having any chest pain. 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

## 2013-07-21 ENCOUNTER — Other Ambulatory Visit: Payer: Self-pay

## 2013-07-21 MED ORDER — NEBIVOLOL HCL 5 MG PO TABS: 5.0000 mg | ORAL_TABLET | Freq: Every day | ORAL | Status: DC

## 2013-08-22 ENCOUNTER — Encounter: Payer: Self-pay | Admitting: Internal Medicine

## 2013-09-02 ENCOUNTER — Encounter: Payer: Self-pay | Admitting: *Deleted

## 2013-09-05 ENCOUNTER — Encounter: Payer: Self-pay | Admitting: Cardiovascular Disease

## 2013-09-05 ENCOUNTER — Ambulatory Visit (INDEPENDENT_AMBULATORY_CARE_PROVIDER_SITE_OTHER): Payer: BC Managed Care – PPO | Admitting: Cardiovascular Disease

## 2013-09-05 VITALS — BP 108/56 | HR 60 | Ht 68.0 in | Wt 169.8 lb

## 2013-09-05 DIAGNOSIS — N5231 Erectile dysfunction following radical prostatectomy: Secondary | ICD-10-CM

## 2013-09-05 DIAGNOSIS — I480 Paroxysmal atrial fibrillation: Secondary | ICD-10-CM

## 2013-09-05 DIAGNOSIS — Z8546 Personal history of malignant neoplasm of prostate: Secondary | ICD-10-CM

## 2013-09-05 DIAGNOSIS — I2510 Atherosclerotic heart disease of native coronary artery without angina pectoris: Secondary | ICD-10-CM

## 2013-09-05 DIAGNOSIS — N529 Male erectile dysfunction, unspecified: Secondary | ICD-10-CM

## 2013-09-05 DIAGNOSIS — E786 Lipoprotein deficiency: Secondary | ICD-10-CM

## 2013-09-05 DIAGNOSIS — I4891 Unspecified atrial fibrillation: Secondary | ICD-10-CM

## 2013-09-05 DIAGNOSIS — Z8249 Family history of ischemic heart disease and other diseases of the circulatory system: Secondary | ICD-10-CM

## 2013-09-05 MED ORDER — NEBIVOLOL HCL 5 MG PO TABS: 5.0000 mg | ORAL_TABLET | Freq: Every day | ORAL | Status: DC

## 2013-09-05 NOTE — Patient Instructions (Signed)
Your physician recommends that you schedule a follow-up appointment in: 6 months.  The Bystolic has been refilled to  [facility].

## 2013-09-05 NOTE — Progress Notes (Signed)
Patient ID: Jose Warner, male   DOB: [birth year], 63 y.o.   MRN: [id number]      HPI: Jose Warner is a 63 y.o. male who presents for a 3 month cardiology followup evaluation.  Mr. Krach has a strong family history for premature coronary artery disease. In May 2006 cardiac catheterization showed very mild LAD and circumflex narrowing felt to be not flow limiting at approximately 20-30%. He has a history of low HDL levels. He developed focal prostate cancer and underwent robotic prostatectomy by Dr. Araceli Bouche in 2014 When I saw him after his prostate surgery in March 2014 he was in atrial fibrillation at which time he was completely unaware of this rhythm disturbance. He was started on eliquis 5 mg twice a day anticoagulation and as well as metoprolol succinate. He subsequently converted spontaneously to sinus rhythm and has been maintaining sinus rhythm since. He remains active. He exercises daily. He denies any chest pain. He denies shortness of breath. He denies palpitations. His echo Doppler study showed an ejection fraction in the 55-65% range. He had  upper normal LA size. There is mild aortic insufficiency. Pulmonary pressures were normal with an estimated RV systolic pressure 15 mm.  In the past, he had been on Niaspan for low HDL levels. This was stopped when he was also put on cialis following his prostate surgery. He no longer takes cialis.  He did have recent blood work done in April 2014 showed cholesterol 131 triglycerides 96 his HDL remains very low at 23 and his LDL was 89. Thyroid function studies were normal as were his hemoglobin hematocrit and chemistries.  Over the past several months, he now has been on Bystolic 5 mg in place of metoprolol.  This has helped his fatigue.  However, unfortunately, it has not helped his erectile dysfunction.  He had follow up with Dr. Araceli Bouche with reference to this and also did not benefit from viagra.  He tells me he is being evaluated for iron  deficiency anemia.  He has a planned endoscopy scheduled for July with Dr. Henrene Pastor.  He is unaware of any black stool or awareness of blood per rectum  Past Medical History  Diagnosis Date  . Cancer 05-10-12    ;bx. 04-12-12, dx. Prostate cancer  . Bronchitis 05-10-12    past fall- 4 runs antibiotics due to bronchitis-uses [employer] as needed  . Hepatitis 05-10-12    "was told non A, non B"-unclean dental equip.  Marland Kitchen Hyperlipidemia     Past Surgical History  Procedure Laterality Date  . Hernia repair  5 yrs ago    right   . Transurethral resection of prostate  71yr ago  . Robot assisted laparoscopic radical prostatectomy  05/20/2012    Procedure: ROBOTIC ASSISTED LAPAROSCOPIC RADICAL PROSTATECTOMY LEVEL 1;  Surgeon: LDutch Gray MD;  Location: [facility];  Service: Urology;  Laterality: N/A;     . Nm myocar perf wall motion  11/30/2007    protocol:Bruce, post EF 67%,mild perfusion defect seen in basal inferior consistant with Attenuation artifact, exercise cap. 12METS  . Cardiac catheterization  09/13/2004    LAD:30%-40% in prox. to mid segment with 20% in the mid segment and 20% in left Circ., mild 20% right common iliac narrowing. medical therapy    Allergies  Allergen Reactions  . Sulfa Antibiotics Rash    Current Outpatient Prescriptions  Medication Sig Dispense Refill  . aspirin 325 MG tablet Take 325 mg by mouth daily.      .Marland Kitchen  Multiple Vitamins-Minerals (MULTIVITAMIN PO) Take 1 tablet by mouth daily.      . nebivolol (BYSTOLIC) 5 MG tablet Take 1 tablet (5 mg total) by mouth daily.  90 tablet  3  . niacin (NIASPAN) 500 MG CR tablet Take 1 tablet (500 mg total) by mouth at bedtime.  90 tablet  3  . omeprazole (PRILOSEC) 20 MG capsule Take 20 mg by mouth 2 (two) times daily.      Marland Kitchen PROAIR HFA 108 (90 BASE) MCG/ACT inhaler Take 2 puffs by mouth 2 (two) times daily as needed. For shortness of breath.      . simvastatin (ZOCOR) 40 MG tablet Take 1 tablet (40 mg total) by mouth at bedtime.   90 tablet  3   No current facility-administered medications for this visit.    History   Social History  . Marital Status: Married    Spouse Name: N/A    Number of Children: N/A  . Years of Education: N/A   Occupational History  . Not on file.   Social History Main Topics  . Smoking status: Never Smoker   . Smokeless tobacco: Never Used  . Alcohol Use: 1.2 oz/week    2 Glasses of wine per week     Comment: occ. x 2 drinks weekly  . Drug Use: No  . Sexual Activity: Yes   Other Topics Concern  . Not on file   Social History Narrative  . 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  ROS General: Negative; No fevers, chills, or night sweats;  HEENT: Negative; No changes in vision or hearing, sinus congestion, difficulty swallowing Pulmonary: Negative; No cough, wheezing, shortness of breath, hemoptysis Cardiovascular: Negative; No chest pain, presyncope, syncope, palpatations GI: Negative; No nausea, vomiting, diarrhea, or abdominal pain GU: Positive for erectile dysfunction since his robotic prostatic surgery No dysuria, hematuria, or difficulty voiding Musculoskeletal: Negative; no myalgias, joint pain, or weakness Hematologic/Oncology: Negative; no easy bruising, bleeding Endocrine: Negative; no heat/cold intolerance; no diabetes Neuro: Negative; no changes in balance, headaches Skin: Negative; No rashes or skin lesions Psychiatric: Negative; No behavioral problems, depression Sleep: Negative; No snoring, daytime sleepiness, hypersomnolence, bruxism, restless legs, hypnogognic hallucinations, no cataplexy Other comprehensive 14 point system review is negative.   PE BP 108/56  Pulse 60  Ht 5' 8"  (1.727 m)  Wt 169 lb 12.8 oz (77.021 kg)  BMI 25.82 kg/m2  General: Alert, oriented, no distress.  Skin: normal turgor, no rashes HEENT: Normocephalic, atraumatic. Pupils round and  reactive; sclera anicteric;no lid lag.  Nose without nasal septal hypertrophy Mouth/Parynx benign; Mallinpatti scale 2 Neck: No JVD, no carotid bruits with normal carotid upstroke Lungs: clear to ausculatation and percussion; no wheezing or rales Chest wall: No tenderness to palpation. Heart: RRR, s1 s2 normal no S3 or S4 gallop. 1/6 aortic insufficiency murmur, unchanged. No S3 or S4 gallop. No rubs thrills or heaves. Abdomen: soft, nontender; no hepatosplenomehaly, BS+; abdominal aorta nontender and not dilated by palpation. Back: No CVA tenderness Pulses 2+ Extremities: no clubbing cyanosis or edema, Homan's sign negative  Neurologic: grossly nonfocal Psychological: Normal affect and mood  ECG (and apparently read by me): Normal sinus rhythm at 60 beats per minute.  Early transition.  No ectopy.  PR interval 174 ms, QTc interval 460 ms.  ECG: (independently read by me): Sinus rhythm at 56 beats per minute.  No ectopy. Normal intervals.  LABS:  BMET    Component Value Date/Time   NA 137 05/10/2012 1330   K 4.5 05/10/2012 1330   CL 104 05/10/2012 1330   CO2 23 05/10/2012 1330   GLUCOSE 86 05/10/2012 1330   BUN 13 05/10/2012 1330   CREATININE 1.05 05/10/2012 1330   CALCIUM 8.6 05/10/2012 1330   GFRNONAA 75* 05/10/2012 1330   GFRAA 87* 05/10/2012 1330     Hepatic Function Panel  No results found for this basename: prot,  albumin,  ast,  alt,  alkphos,  bilitot,  bilidir,  ibili     CBC    Component Value Date/Time   WBC 5.9 05/10/2012 1330   RBC 4.75 05/10/2012 1330   HGB 12.4* 05/21/2012 0503   HCT 37.9* 05/21/2012 0503   PLT 239 05/10/2012 1330   MCV 86.1 05/10/2012 1330   MCH 27.6 05/10/2012 1330   MCHC 32.0 05/10/2012 1330   RDW 13.6 05/10/2012 1330     BNP No results found for this basename: probnp    Lipid Panel  No results found for this basename: chol,  trig,  hdl,  cholhdl,  vldl,  ldlcalc     RADIOLOGY: No results found.    ASSESSMENT AND PLAN: Mr. Santelli  has been maintaining normal sinus rhythm since beta blocker was started after he developed late postoperative atrial fibrillation following his prostatectomy. He has normal LV function. His left atrium is not dilated.  Since I last saw him, he has been off metoprolol and in its place.  Has been on low-dose atenolol at 5 mg.  He has tolerated this well.  There is less fatigued.  Unfortunately, this did not alter his erectile dysfunction, which has persisted since his prostatic surgery.  He is on simvastatin 40 mg and niaspan for his hyperlipidemia with extremely low HDL levels and is tolerating this well.  He tells me Dr. Ihor Austin will be checking complete laboratory.  I recommended that he reduce his aspirin dose from 325 mg to 81 mg.  He also is taking omeprazole 20 mg twice a day and has noted some improvement in GERD symptoms.  He is scheduled for endoscopy in early July.  He is not having any anginal symptoms.  Blood pressure continues to be well-controlled.  I will see him in 6 months for cardiology reevaluation Troy Sine, MD, [facility]  09/05/2013 2:41 PM

## 2013-09-06 ENCOUNTER — Encounter: Payer: Self-pay | Admitting: Cardiovascular Disease

## 2013-10-24 ENCOUNTER — Ambulatory Visit (INDEPENDENT_AMBULATORY_CARE_PROVIDER_SITE_OTHER): Payer: BC Managed Care – PPO | Admitting: Internal Medicine

## 2013-10-24 ENCOUNTER — Encounter: Payer: Self-pay | Admitting: Internal Medicine

## 2013-10-24 ENCOUNTER — Other Ambulatory Visit: Payer: BC Managed Care – PPO

## 2013-10-24 VITALS — BP 92/60 | HR 64 | Ht 67.0 in | Wt 171.6 lb

## 2013-10-24 DIAGNOSIS — D509 Iron deficiency anemia, unspecified: Secondary | ICD-10-CM

## 2013-10-24 DIAGNOSIS — K219 Gastro-esophageal reflux disease without esophagitis: Secondary | ICD-10-CM

## 2013-10-24 DIAGNOSIS — Z86010 Personal history of colonic polyps: Secondary | ICD-10-CM

## 2013-10-24 NOTE — Progress Notes (Signed)
HISTORY OF PRESENT ILLNESS:  Jose Warner is a 63 y.o. male with a history of prostate cancer status post robot assisted radical prostatectomy January 2014, hyperlipidemia, GERD, and adenomatous colon polyps. The patient is referred today by Dr. Derinda Late for new onset iron deficiency anemia. Last October on routine physical examination blood work revealed low RBC indices. Repeat CBC 6 months later revealed mild anemia with hemoglobin 12.4. Further evaluation revealed iron deficiency with saturation of 16% and ferritin of 19. Stool Hemoccult studies were negative. He was having active reflux symptoms with associated chest pain for which she was placed on omeprazole. The symptoms have completely resolved. He continues on omeprazole. Patient's GI review of systems is entirely negative. In particular, no melena, hematochezia, abdominal pain, change in bowel habits, or weight loss. He does take a baby aspirin daily. His also take a multivitamin with iron. He has not a blood donor. No family history of celiac disease or blood dyscrasias. The patient has had prior colonoscopies in 2004, 2000 a, and most recently December 2013 (diverticulosis only).  REVIEW OF SYSTEMS:  All non-GI ROS negative upon comprehensive review  Past Medical History  Diagnosis Date  . Cancer 05-10-12    ;bx. 04-12-12, dx. Prostate cancer  . Bronchitis 05-10-12    past fall- 4 runs antibiotics due to bronchitis-uses [employer] as needed  . Hepatitis 05-10-12    "was told non A, non B"-unclean dental equip.  Marland Kitchen Hyperlipidemia   . Diverticulosis     Past Surgical History  Procedure Laterality Date  . Hernia repair  5 yrs ago    right   . Transurethral resection of prostate  71yr ago  . Robot assisted laparoscopic radical prostatectomy  05/20/2012    Procedure: ROBOTIC ASSISTED LAPAROSCOPIC RADICAL PROSTATECTOMY LEVEL 1;  Surgeon: LDutch Gray MD;  Location: [facility];  Service: Urology;  Laterality: N/A;     . Nm myocar perf  wall motion  11/30/2007    protocol:Bruce, post EF 67%,mild perfusion defect seen in basal inferior consistant with Attenuation artifact, exercise cap. 12METS  . Cardiac catheterization  09/13/2004    LAD:30%-40% in prox. to mid segment with 20% in the mid segment and 20% in left Circ., mild 20% right common iliac narrowing. medical therapy    Social History Jose Warner reports that he has never smoked. He has never used smokeless tobacco. He reports that he drinks about 1.2 ounces of alcohol per week. He reports that he does not use illicit drugs.  family history includes Cancer in his paternal grandmother and sister; Heart attack in his father and paternal grandfather; Heart disease in his father and paternal grandfather; Stroke in his maternal grandfather and mother. There is no history of Colon cancer.  Allergies  Allergen Reactions  . Sulfa Antibiotics Rash       PHYSICAL EXAMINATION: Vital signs: BP 92/60  Pulse 64  Ht 5' 7"  (1.702 m)  Wt 171 lb 9.6 oz (77.837 kg)  BMI 26.87 kg/m2 General: Well-developed, well-nourished, no acute distress HEENT: Sclerae are anicteric, conjunctiva pink. Oral mucosa intact Lungs: Clear Heart: Regular Abdomen: soft, nontender, nondistended, no obvious ascites, no peritoneal signs, normal bowel sounds. No organomegaly. Small surgical incision midabdomen well healed Extremities: No edema Psychiatric: alert and oriented x3. Cooperative   ASSESSMENT:  #1. Mild iron deficiency anemia. Rule out GI mucosal abnormality. Rule out iron absorption disorder #2. GERD. Symptoms resolved on PPI #3. History of adenomatous colon polyps. Last colonoscopy December 2013  negative for neoplasia #4. History of prostate cancer status post robot assisted radical prostatectomy January 2014   PLAN:  #1. Tissue transglutaminase antibody #2. Diagnostic upper endoscopy with probable duodenal biopsies.The nature of the procedure, as well as the risks, benefits,  and alternatives were carefully and thoroughly reviewed with the patient. Ample time for discussion and questions allowed. The patient understood, was satisfied, and agreed to proceed. #3. If upper endoscopy on revealing would expand workup to evaluate small bowel with capsule endoscopy. #4. Surveillance colonoscopy planned for around December 2018

## 2013-10-24 NOTE — Patient Instructions (Signed)
Your physician has requested that you go to the basement for the following lab work before leaving today:  TTG  You have been scheduled for an endoscopy. Please follow written instructions given to you at your visit today. If you use inhalers (even only as needed), please bring them with you on the day of your procedure. Your physician has requested that you go to www.startemmi.com and enter the access code given to you at your visit today. This web site gives a general overview about your procedure. However, you should still follow specific instructions given to you by our office regarding your preparation for the procedure.

## 2013-10-25 LAB — TISSUE TRANSGLUTAMINASE, IGA: TISSUE TRANSGLUTAMINASE AB, IGA: 329 U/mL — AB (ref <20)

## 2013-11-02 ENCOUNTER — Ambulatory Visit (AMBULATORY_SURGERY_CENTER): Payer: BC Managed Care – PPO | Admitting: Internal Medicine

## 2013-11-02 ENCOUNTER — Telehealth: Payer: Self-pay

## 2013-11-02 ENCOUNTER — Encounter: Payer: Self-pay | Admitting: Internal Medicine

## 2013-11-02 VITALS — BP 112/68 | HR 57 | Temp 97.0°F | Resp 24 | Ht 67.0 in | Wt 171.0 lb

## 2013-11-02 DIAGNOSIS — K2980 Duodenitis without bleeding: Secondary | ICD-10-CM

## 2013-11-02 DIAGNOSIS — D509 Iron deficiency anemia, unspecified: Secondary | ICD-10-CM

## 2013-11-02 MED ORDER — SODIUM CHLORIDE 0.9 % IV SOLN: 500.0000 mL | INTRAVENOUS | Status: DC

## 2013-11-02 NOTE — Telephone Encounter (Signed)
Message copied by Algernon Huxley on Wed Nov 02, 2013  4:30 PM ------      Message from: Irene Shipper      Created: Wed Nov 02, 2013  4:15 PM      Regarding: RE: OV       Put him in Tuesday, August 4 at 11:15 AM. Thanks      ----- Message -----         From: Maury Dus, RN         Sent: 11/02/2013   3:59 PM           To: Irene Shipper, MD      Subject: OV                                                       Dr. Henrene Pastor,            On the procedure note from today you mention pt being seen in 2 weeks. Your first available appt is 12/14/13. Please advise.            Thanks,      Vaughan Basta       ------

## 2013-11-02 NOTE — Progress Notes (Signed)
Report to PACU, RN, vss, BBS= Clear.  

## 2013-11-02 NOTE — Progress Notes (Signed)
Called to room to assist during endoscopic procedure.  Patient ID and intended procedure confirmed with present staff. Received instructions for my participation in the procedure from the performing physician.  

## 2013-11-02 NOTE — Telephone Encounter (Signed)
Pt scheduled and mailed appt letter.

## 2013-11-02 NOTE — Op Note (Signed)
[facility] [place]  [address] [place], [address]   ENDOSCOPY PROCEDURE REPORT  PATIENT: Jose Warner, Jose Warner  MR#: [id number] BIRTHDATE: 1950-07-06 , 63  yrs. old GENDER: Male ENDOSCOPIST: Eustace Quail, MD REFERRED BY:  Derinda Late, M.D. PROCEDURE DATE:  11/02/2013 PROCEDURE:  EGD w/ biopsy ASA CLASS:     Class II INDICATIONS:  Iron deficiency anemia.  . Elevated tissue transglutaminase antibody. GERD MEDICATIONS: MAC sedation, administered by CRNA and propofol (Diprivan) 140m IV TOPICAL ANESTHETIC: none  DESCRIPTION OF PROCEDURE: After the risks benefits and alternatives of the procedure were thoroughly explained, informed consent was obtained.  The LB [id number] was introduced through the mouth and advanced to the third portion of the duodenum. Without limitations.  The instrument was slowly withdrawn as the mucosa was fully examined.      EXAM:  The esophagus was normal.  The stomach was normal.  The duodenal bulb was normal.  The post bulbar duodenum revealed scalloping of the mucosa consistent with celiac sprue.  Multiple biopsies of the duodenum taken.  Retroflexed views revealed no abnormalities.     The scope was then withdrawn from the patient and the procedure completed.  COMPLICATIONS: There were no complications. ENDOSCOPIC IMPRESSION: 1. 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 This to account for iron deficiency anemia 2. GERD by history. Symptoms improved on PPI.  RECOMMENDATIONS: 1.  Await biopsy results 2.  Continue PPI for GERD symptoms 3.  Call for office visit to be seen in a few weeks to discuss results and plans 4.  Read literature on celiac disease as well as gluten-free diet  REPEAT EXAM:  eSigned:  JEustace Quail MD 11/02/2013 10:16 AM   COM:[facility], [facility] and The Patient

## 2013-11-02 NOTE — Patient Instructions (Signed)
YOU HAD AN ENDOSCOPIC PROCEDURE TODAY AT [facility]: Refer to the procedure report that was given to you for any specific questions about what was found during the examination.  If the procedure report does not answer your questions, please call your gastroenterologist to clarify.  If you requested that your care partner not be given the details of your procedure findings, then the procedure report has been included in a sealed envelope for you to review at your convenience later.  YOU SHOULD EXPECT: Some feelings of bloating in the abdomen. Passage of more gas than usual.  Walking can help get rid of the air that was put into your GI tract during the procedure and reduce the bloating. If you had a lower endoscopy (such as a colonoscopy or flexible sigmoidoscopy) you may notice spotting of blood in your stool or on the toilet paper. If you underwent a bowel prep for your procedure, then you may not have a normal bowel movement for a few days.  DIET: Your first meal following the procedure should be a light meal and then it is ok to progress to your normal diet.  A half-sandwich or bowl of soup is an example of a good first meal.  Heavy or fried foods are harder to digest and may make you feel nauseous or bloated.  Likewise meals heavy in dairy and vegetables can cause extra gas to form and this can also increase the bloating.  Drink plenty of fluids but you should avoid alcoholic beverages for 24 hours.  ACTIVITY: Your care partner should take you home directly after the procedure.  You should plan to take it easy, moving slowly for the rest of the day.  You can resume normal activity the day after the procedure however you should NOT DRIVE or use heavy machinery for 24 hours (because of the sedation medicines used during the test).    SYMPTOMS TO REPORT IMMEDIATELY: A gastroenterologist can be reached at any hour.  During normal business hours, 8:30 AM to 5:00 PM Monday through Friday,  call [phone].  After hours and on weekends, please call the GI answering service at [phone] who will take a message and have the physician on call contact you.   F  Following upper endoscopy (EGD)  Vomiting of blood or coffee ground material  New chest pain or pain under the shoulder blades  Painful or persistently difficult swallowing  New shortness of breath  Fever of 100F or higher  Black, tarry-looking stools  FOLLOW UP: If any biopsies were taken you will be contacted by phone or by letter within the next 1-3 weeks.  Call your gastroenterologist if you have not heard about the biopsies in 3 weeks.  Our staff will call the home number listed on your records the next business day following your procedure to check on you and address any questions or concerns that you may have at that time regarding the information given to you following your procedure. This is a courtesy call and so if there is no answer at the home number and we have not heard from you through the emergency physician on call, we will assume that you have returned to your regular daily activities without incident.  SIGNATURES/CONFIDENTIALITY: You and/or your care partner have signed paperwork which will be entered into your electronic medical record.  These signatures attest to the fact that that the information above on your After Visit Summary has been reviewed and is understood.  Full responsibility of the confidentiality of this discharge information lies with you and/or your care-partner.    Call to make appointment in a few weeks with Dr. Henrene Pastor in office   Continue anti reflux medication   Information on celiac sprue and gluten free diet given to you today

## 2013-11-03 ENCOUNTER — Telehealth: Payer: Self-pay | Admitting: *Deleted

## 2013-11-03 NOTE — Telephone Encounter (Signed)
  Follow up Call-  Call back number 11/02/2013 04/06/2012  Post procedure Call Back phone  # [phone] cell [phone]  Permission to leave phone message Yes Yes     Patient questions:  Do you have a fever, pain , or abdominal swelling? No. Pain Score  0 *  Have you tolerated food without any problems? Yes.    Have you been able to return to your normal activities? Yes.    Do you have any questions about your discharge instructions: Diet   No. Medications  No. Follow up visit  No.  Do you have questions or concerns about your Care? No.  Actions: * If pain score is 4 or above: No action needed, pain <4.

## 2013-11-08 ENCOUNTER — Encounter: Payer: Self-pay | Admitting: Internal Medicine

## 2013-11-22 ENCOUNTER — Encounter: Payer: Self-pay | Admitting: Internal Medicine

## 2013-11-22 ENCOUNTER — Ambulatory Visit (INDEPENDENT_AMBULATORY_CARE_PROVIDER_SITE_OTHER): Payer: BC Managed Care – PPO | Admitting: Internal Medicine

## 2013-11-22 DIAGNOSIS — K219 Gastro-esophageal reflux disease without esophagitis: Secondary | ICD-10-CM

## 2013-11-22 DIAGNOSIS — D509 Iron deficiency anemia, unspecified: Secondary | ICD-10-CM

## 2013-11-22 DIAGNOSIS — Z86010 Personal history of colonic polyps: Secondary | ICD-10-CM

## 2013-11-22 DIAGNOSIS — K900 Celiac disease: Secondary | ICD-10-CM

## 2013-11-22 NOTE — Patient Instructions (Signed)
Please come back in 6 months to have more labs drawn.  Please follow up with Dr. Henrene Pastor in one year

## 2013-11-22 NOTE — Progress Notes (Signed)
HISTORY OF PRESENT ILLNESS:  Jose Warner is a 63 y.o. male with a history of prostate cancer status post robotic radical prostatectomy January 2014, hyperlipidemia, GERD, and adenomatous colon polyps (prior colonoscopies 2004, 2008, and 2013 with diverticulosis only). He was evaluated in the office 10/25/2013 for mild iron deficiency anemia. See that dictation. Tissue transglutaminase antibody was elevated. Upper endoscopy was performed 11/02/2013. The duodenum revealed mucosal scalloping consistent with celiac disease. The remainder of the upper endoscopy was unremarkable. 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 Patient was notified with these results and has since been on a gluten-free diet. He presents today for routine followup. He has read extensively on celiac disease since his diagnosis. He is tolerating gluten-free diet well. He reports resolution of chronic problems with loose stools and intestinal gas (previously not appreciated). He has a number of questions regarding celiac disease. No new issues or complaints.  REVIEW OF SYSTEMS:  All non-GI ROS negative .  Past Medical History  Diagnosis Date  . Prostate cancer 05-10-12    ;bx. 04-12-12, dx  . Bronchitis 05-10-12    past fall- 4 runs antibiotics due to bronchitis-uses [employer] as needed  . Hepatitis 05-10-12    "was told non A, non B"-unclean dental equip.  Marland Kitchen Hyperlipidemia   . Diverticulosis   . Anemia   . Spasm of esophagus 2013  . GERD (gastroesophageal reflux disease)   . Iron deficiency anemia   . Colon polyps     adenomatous    Past Surgical History  Procedure Laterality Date  . Hernia repair  5 yrs ago    right   . Transurethral resection of prostate  36yr ago  . Robot assisted laparoscopic radical prostatectomy  05/20/2012    Procedure: ROBOTIC ASSISTED LAPAROSCOPIC RADICAL PROSTATECTOMY LEVEL 1;  Surgeon: LDutch Gray MD;  Location: [facility];  Service:  Urology;  Laterality: N/A;     . Nm myocar perf wall motion  11/30/2007    protocol:Bruce, post EF 67%,mild perfusion defect seen in basal inferior consistant with Attenuation artifact, exercise cap. 12METS  . Cardiac catheterization  09/13/2004    LAD:30%-40% in prox. to mid segment with 20% in the mid segment and 20% in left Circ., mild 20% right common iliac narrowing. medical therapy    Social History JMOISHY LADAY reports that he has never smoked. He has never used smokeless tobacco. He reports that he drinks about 1.2 ounces of alcohol per week. He reports that he does not use illicit drugs.  family history includes Cancer in his paternal grandmother and sister; Heart attack in his father and paternal grandfather; Heart disease in his father and paternal grandfather; Stroke in his maternal grandfather and mother. There is no history of Colon cancer, Esophageal cancer, Pancreatic cancer, Rectal cancer, or Stomach cancer.  Allergies  Allergen Reactions  . Sulfa Antibiotics Rash       PHYSICAL EXAMINATION: Vital signs: There were no vitals taken for this visit. General: Well-developed, well-nourished, no acute distress Abdomen: Not reexamined Psychiatric: alert and oriented x3. 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 GERD. Asymptomatic on PPI #4. History of adenomatous colon polyps. Last colonoscopy 2013 negative for neoplasia #5. General medical problems. Stable   PLAN:  #1. Educational discussion on celiac disease including etiology, treatment, and outcomes. He was also directed toward educational websites from our professional Societies #2.  Continue gluten-free diet #3. Repeat tissue transglutaminase antibody in 6 months #4. Repeat endoscopy with biopsies in 1 year to assess for mucosal healing #5. Continue PPI for GERD #6. Surveillance colonoscopy around 2018 #7.  Ongoing general medical care with Dr. Derinda Late

## 2013-11-25 ENCOUNTER — Telehealth: Payer: Self-pay

## 2014-02-03 ENCOUNTER — Telehealth: Payer: Self-pay | Admitting: Cardiovascular Disease

## 2014-02-03 NOTE — Telephone Encounter (Signed)
Returned call to patient he stated his PCP wants to change beta blocker to calcium channel blocker.Stated he had prostate surgery and the beta blocker restricts blood flow.Message sent to [facility] for advice.

## 2014-02-03 NOTE — Telephone Encounter (Signed)
Dr Laural Golden primary doctor would like for him to change medicine.He wants him to change from Bystolic to a calcium channel blocker

## 2014-02-03 NOTE — Telephone Encounter (Signed)
bystolic should not restrict blood flow with its nitric oxide mediation; pt also has h/o PAF for which he is on bb. If  the bb absolutely needs to be dc'd then change to diltiazem as ca channel blocker

## 2014-02-06 MED ORDER — DILTIAZEM HCL ER COATED BEADS 120 MG PO CP24: 120.0000 mg | ORAL_CAPSULE | Freq: Every day | ORAL | Status: DC

## 2014-02-06 NOTE — Telephone Encounter (Signed)
Returned call to patient Dr.Kelly advised if bystolic absolutely needs to be changed then he advised change to diltiazem.Patient would like to change to diltiazem. Message sent to [facility] for mg and directions of diltiazem.

## 2014-02-06 NOTE — Telephone Encounter (Signed)
BP has been low, so start at Cardizem CD 120 mg daily.

## 2014-02-06 NOTE — Telephone Encounter (Signed)
Returned call to patient Dr.Kelly advised stop Bystolic start Cardizem CD 120 mg daily.Prescription sent to pharmacy.

## 2014-02-21 NOTE — Telephone Encounter (Signed)
error 

## 2014-05-05 ENCOUNTER — Encounter: Payer: Self-pay | Admitting: Internal Medicine

## 2014-07-25 ENCOUNTER — Encounter: Payer: Self-pay | Admitting: Internal Medicine

## 2014-07-25 ENCOUNTER — Ambulatory Visit (INDEPENDENT_AMBULATORY_CARE_PROVIDER_SITE_OTHER): Payer: BLUE CROSS/BLUE SHIELD | Admitting: Internal Medicine

## 2014-07-25 ENCOUNTER — Other Ambulatory Visit: Payer: Self-pay

## 2014-07-25 VITALS — BP 122/62 | HR 76 | Ht 67.0 in | Wt 180.0 lb

## 2014-07-25 DIAGNOSIS — K219 Gastro-esophageal reflux disease without esophagitis: Secondary | ICD-10-CM

## 2014-07-25 DIAGNOSIS — K900 Celiac disease: Secondary | ICD-10-CM | POA: Diagnosis not present

## 2014-07-25 NOTE — Progress Notes (Signed)
HISTORY OF PRESENT ILLNESS:  Jose Warner is a 64 y.o. male with past medical history as listed below. He has been followed in this office for GERD, adenomatous colon polyps, and most recently celiac sprue diagnosed on upper endoscopy with biopsy and laboratory testing July 2015 after being evaluated for mild iron deficiency anemia. Since that time he has been on gluten-free diet. He presents today for routine follow-up. Patient reports to me that he has been compliant with gluten-free diet. In retrospect, he notices significant improvement in previously unrecognized GI distress as manifested by upset stomach, borborygmi, and gas. He denies rash or other complaints  REVIEW OF SYSTEMS:  All non-GI ROS negative upon review  Past Medical History  Diagnosis Date  . Prostate cancer 05-10-12    ;bx. 04-12-12, dx  . Bronchitis 05-10-12    past fall- 4 runs antibiotics due to bronchitis-uses [employer] as needed  . Hepatitis 05-10-12    "was told non A, non B"-unclean dental equip.  Marland Kitchen Hyperlipidemia   . Diverticulosis   . Anemia   . Spasm of esophagus 2013  . GERD (gastroesophageal reflux disease)   . Iron deficiency anemia   . Colon polyps     adenomatous  . Celiac disease     Past Surgical History  Procedure Laterality Date  . Hernia repair  5 yrs ago    right   . Transurethral resection of prostate  39yr ago  . Robot assisted laparoscopic radical prostatectomy  05/20/2012    Procedure: ROBOTIC ASSISTED LAPAROSCOPIC RADICAL PROSTATECTOMY LEVEL 1;  Surgeon: LDutch Gray MD;  Location: [facility];  Service: Urology;  Laterality: N/A;     . Nm myocar perf wall motion  11/30/2007    protocol:Bruce, post EF 67%,mild perfusion defect seen in basal inferior consistant with Attenuation artifact, exercise cap. 12METS  . Cardiac catheterization  09/13/2004    LAD:30%-40% in prox. to mid segment with 20% in the mid segment and 20% in left Circ., mild 20% right common iliac narrowing. medical therapy     Social History Jose Warner reports that he has never smoked. He has never used smokeless tobacco. He reports that he drinks about 1.2 oz of alcohol per week. He reports that he does not use illicit drugs.  family history includes Cancer in his paternal grandmother and sister; Heart attack in his father and paternal grandfather; Heart disease in his father and paternal grandfather; Stroke in his maternal grandfather and mother. There is no history of Colon cancer, Esophageal cancer, Pancreatic cancer, Rectal cancer, or Stomach cancer.  Allergies  Allergen Reactions  . Sulfa Antibiotics Rash       PHYSICAL EXAMINATION: Vital signs: BP 122/62 mmHg  Pulse 76  Ht 5' 7"  (1.702 m)  Wt 180 lb (81.647 kg)  BMI 28.19 kg/m2 General: Well-developed, well-nourished, no acute distress HEENT: Sclerae are anicteric, conjunctiva pink. Oral mucosa intact Lungs: Clear Heart: Regular Abdomen: soft, nontender, nondistended, no obvious ascites, no peritoneal signs, normal bowel sounds. No organomegaly. Extremities: No edema Psychiatric: alert and oriented x3. Cooperative     ASSESSMENT:  #1. Celiac sprue. Currently asymptomatic on gluten-free diet #2. History of mild iron deficiency anemia secondary to celiac sprue #3. History of adenomatous colon polyps. Last colonoscopy 2013 #4. GERD. Asymptomatic on PPI   PLAN:  #1. Obtain tissue transglutaminase antibody today #2. No indication for follow-up endoscopy at present. We discussed this #3. Surveillance colonoscopy around 2018 #4. Routine office follow-up in 1 year. Sooner  if needed #5. Continue reflux precautions and PPI to control GERD

## 2014-07-25 NOTE — Patient Instructions (Signed)
Your physician has requested that you go to the basement for the following lab work before leaving today:  TTG

## 2014-07-26 LAB — TISSUE TRANSGLUTAMINASE, IGA: TISSUE TRANSGLUTAMINASE AB, IGA: 1 U/mL (ref <4)

## 2014-09-26 ENCOUNTER — Other Ambulatory Visit: Payer: Self-pay | Admitting: Cardiovascular Disease

## 2014-09-26 NOTE — Telephone Encounter (Signed)
Rx(s) sent to pharmacy electronically.  

## 2015-03-08 ENCOUNTER — Encounter: Payer: Self-pay | Admitting: Cardiovascular Disease

## 2015-04-19 ENCOUNTER — Ambulatory Visit
Admission: RE | Admit: 2015-04-19 | Discharge: 2015-04-19 | Disposition: A | Discharge status: Home / Self Care | Payer: BLUE CROSS/BLUE SHIELD | Source: Ambulatory Visit | Attending: Family Medicine | Admitting: Family Medicine

## 2015-04-19 ENCOUNTER — Other Ambulatory Visit: Payer: Self-pay | Admitting: Family Medicine

## 2015-04-19 DIAGNOSIS — R05 Cough: Secondary | ICD-10-CM

## 2015-04-19 DIAGNOSIS — R059 Cough, unspecified: Secondary | ICD-10-CM

## 2016-01-29 ENCOUNTER — Encounter: Payer: Self-pay | Admitting: Cardiovascular Disease

## 2016-01-29 ENCOUNTER — Ambulatory Visit (INDEPENDENT_AMBULATORY_CARE_PROVIDER_SITE_OTHER): Payer: BLUE CROSS/BLUE SHIELD | Admitting: Cardiovascular Disease

## 2016-01-29 VITALS — BP 112/64 | HR 59 | Ht 67.0 in | Wt 182.8 lb

## 2016-01-29 DIAGNOSIS — I2510 Atherosclerotic heart disease of native coronary artery without angina pectoris: Secondary | ICD-10-CM

## 2016-01-29 DIAGNOSIS — K219 Gastro-esophageal reflux disease without esophagitis: Secondary | ICD-10-CM | POA: Diagnosis not present

## 2016-01-29 DIAGNOSIS — K900 Celiac disease: Secondary | ICD-10-CM

## 2016-01-29 DIAGNOSIS — N5231 Erectile dysfunction following radical prostatectomy: Secondary | ICD-10-CM

## 2016-01-29 DIAGNOSIS — I480 Paroxysmal atrial fibrillation: Secondary | ICD-10-CM

## 2016-01-29 NOTE — Patient Instructions (Addendum)
Your physician wants you to follow-up in: 1 year or sooner if needed. You will receive a reminder letter in the mail two months in advance. If you don't receive a letter, please call our office to schedule the follow-up appointment.   If you need a refill on your cardiac medications before your next appointment, please call your pharmacy.   

## 2016-01-29 NOTE — Progress Notes (Signed)
Patient ID: Jose Warner, male   DOB: 06/12/1950, 65 y.o.   MRN: [id number]     PCP: Dr. Derinda Late   HPI: Jose Warner is a 65 y.o. male who presents for a 56 month cardiology followup evaluation.  I last saw him in May 2015.  He is now retired.  Jose Warner has a strong family history for premature coronary artery disease. In May 2006 cardiac catheterization showed very mild LAD and circumflex narrowing felt to be not flow limiting at approximately 20-30%. He has a history of low HDL levels. He developed focal prostate cancer and underwent robotic prostatectomy by Dr. Araceli Bouche in 2014 When I saw him after his prostate surgery in March 2014 he was in atrial fibrillation at which time he was completely unaware of this rhythm disturbance. He was started on eliquis 5 mg twice a day anticoagulation and as well as metoprolol succinate. He subsequently converted spontaneously to sinus rhythm and has been maintaining sinus rhythm since. He remains active. He exercises daily. He denies any chest pain. He denies shortness of breath. He denies palpitations. His echo Doppler study showed an ejection fraction in the 55-65% range. He had  upper normal LA size. There is mild aortic insufficiency. Pulmonary pressures were normal with an estimated RV systolic pressure 15 mm.  In the past, he had been on Niaspan for low HDL levels. This was stopped when he was also put on cialis following his prostate surgery. He no longer takes cialis.  Laboratory done in April 2014 showed cholesterol 131 triglycerides 96 his HDL remains very low at 23 and his LDL was 89. Thyroid function studies were normal as were his hemoglobin hematocrit and chemistries.  Since I last saw him, he has been busy.  He now has 10 grandchildren.  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  He denies any episodes of chest pain.  Dr. Sandi Mariscal has put him back on his simvastatin 40 mg and niacin 1000 mg  based on laboratory that he had done.  He continues to take Bystolic 5 mg for hypertension.  He continues to have difficulty with erectile function following his prostate surgery.  He also was diagnosed with celiac disease and is extremely sensitive to gluten.  He presents for cardiology evaluation  Past Medical History:  Diagnosis Date  . Anemia   . Bronchitis 05-10-12   past fall- 4 runs antibiotics due to bronchitis-uses [employer] as needed  . Celiac disease   . Colon polyps    adenomatous  . Diverticulosis   . GERD (gastroesophageal reflux disease)   . Hepatitis 05-10-12   "was told non A, non B"-unclean dental equip.  Marland Kitchen Hyperlipidemia   . Iron deficiency anemia   . Prostate cancer ([place]) 05-10-12   ;bx. 04-12-12, dx  . Spasm of esophagus 2013    Past Surgical History:  Procedure Laterality Date  . CARDIAC CATHETERIZATION  09/13/2004   LAD:30%-40% in prox. to mid segment with 20% in the mid segment and 20% in left Circ., mild 20% right common iliac narrowing. medical therapy  . HERNIA REPAIR  5 yrs ago   right   . NM MYOCAR PERF WALL MOTION  11/30/2007   protocol:Bruce, post EF 67%,mild perfusion defect seen in basal inferior consistant with Attenuation artifact, exercise cap. 12METS  . ROBOT ASSISTED LAPAROSCOPIC RADICAL PROSTATECTOMY  05/20/2012   Procedure: ROBOTIC ASSISTED LAPAROSCOPIC RADICAL PROSTATECTOMY LEVEL 1;  Surgeon: Dutch Gray, MD;  Location: [facility];  Service: Urology;  Laterality: N/A;     . TRANSURETHRAL RESECTION OF PROSTATE  10yr ago    Allergies  Allergen Reactions  . Gluten Meal   . Wheat Bran   . Sulfa Antibiotics Rash    Current Outpatient Prescriptions  Medication Sig Dispense Refill  . aspirin 81 MG tablet Take 81 mg by mouth daily.    . Multiple Vitamins-Minerals (MULTIVITAMIN PO) Take 1 tablet by mouth daily.    . nebivolol (BYSTOLIC) 5 MG tablet Take 1 tablet (5 mg total) by mouth daily. NEEDS APPOINTMENT FOR FUTURE REFILLS 30 tablet 0  . niacin  (NIASPAN) 1000 MG CR tablet Take 1 tablet by mouth daily.    .Marland Kitchenomeprazole (PRILOSEC) 20 MG capsule Take 20 mg by mouth 2 (two) times daily.    .Marland KitchenPROAIR HFA 108 (90 BASE) MCG/ACT inhaler Take 2 puffs by mouth 2 (two) times daily as needed. For shortness of breath.    . simvastatin (ZOCOR) 40 MG tablet Take 1 tablet by mouth daily.     No current facility-administered medications for this visit.     Social History   Social History  . Marital status: Married    Spouse name: N/A  . Number of children: 3  . Years of education: N/A   Occupational History  .  [employer]   Social History Main Topics  . Smoking status: Never Smoker  . Smokeless tobacco: Never Used  . Alcohol use 1.2 oz/week    2 Glasses of wine per week     Comment: occ. x 2 drinks weekly  . Drug use: No  . Sexual activity: Yes   Other Topics Concern  . Not on file   Social History Narrative  . No narrative on file    Socially, he is a PhD and Is now retired from [employer]  He had been in the turf business and floral arrangements.  ROS General: Negative; No fevers, chills, or night sweats;  HEENT: Negative; No changes in vision or hearing, sinus congestion, difficulty swallowing Pulmonary: Negative; No cough, wheezing, shortness of breath, hemoptysis Cardiovascular: Negative; No chest pain, presyncope, syncope, palpatations GI: Positive for celiac disease GU: Positive for erectile dysfunction since his robotic prostatic surgery No dysuria, hematuria, or difficulty voiding Musculoskeletal: Negative; no myalgias, joint pain, or weakness Hematologic/Oncology: Negative; no easy bruising, bleeding Endocrine: Negative; no heat/cold intolerance; no diabetes Neuro: Negative; no changes in balance, headaches Skin: Negative; No rashes or skin lesions Psychiatric: Negative; No behavioral problems, depression Sleep: Negative; No snoring, daytime sleepiness, hypersomnolence, bruxism, restless legs, hypnogognic  hallucinations, no cataplexy Other comprehensive 14 point system review is negative.   PE BP 112/64 (BP Location: Left Arm, Patient Position: Sitting, Cuff Size: Normal)   Pulse (!) 59   Ht 5' 7"  (1.702 m)   Wt 182 lb 12.8 oz (82.9 kg)   BMI 28.63 kg/m    Repeat blood pressure by me was 120/70.  Wt Readings from Last 3 Encounters:  01/29/16 182 lb 12.8 oz (82.9 kg)  07/25/14 180 lb (81.6 kg)  11/02/13 171 lb (77.6 kg)   General: Alert, oriented, no distress.  Skin: normal turgor, no rashes HEENT: Normocephalic, atraumatic. Pupils round and reactive; sclera anicteric;no lid lag.  Nose without nasal septal hypertrophy Mouth/Parynx benign; Mallinpatti scale 2 Neck: No JVD, no carotid bruits with normal carotid upstroke Lungs: clear to ausculatation and percussion; no wheezing or rales Chest wall: No tenderness to palpation. Heart: RRR, s1 s2 normal  no S3 or S4 gallop. 1/6 aortic insufficiency murmur, unchanged. No S3 or S4 gallop. No rubs thrills or heaves. Abdomen: soft, nontender; no hepatosplenomehaly, BS+; abdominal aorta nontender and not dilated by palpation. Back: No CVA tenderness Pulses 2+ Extremities: no clubbing cyanosis or edema, Homan's sign negative  Neurologic: grossly nonfocal Psychological: Normal affect and mood  ECG (independently read by me): Sinus bradycardia 59 bpm.  Normal intervals.  No ectopy.  May 2015 ECG (and apparently read by me): Normal sinus rhythm at 60 beats per minute.  Early transition.  No ectopy.  PR interval 174 ms, QTc interval 460 ms.  ECG: (independently read by me): Sinus rhythm at 56 beats per minute. No ectopy. Normal intervals.  LABS:  BMET    Component Value Date/Time   NA 137 05/10/2012 1330   K 4.5 05/10/2012 1330   CL 104 05/10/2012 1330   CO2 23 05/10/2012 1330   GLUCOSE 86 05/10/2012 1330   BUN 13 05/10/2012 1330   CREATININE 1.05 05/10/2012 1330   CALCIUM 8.6 05/10/2012 1330   GFRNONAA 75 (L) 05/10/2012 1330    GFRAA 87 (L) 05/10/2012 1330     Hepatic Function Panel  No results found for: PROT   CBC    Component Value Date/Time   WBC 5.9 05/10/2012 1330   RBC 4.75 05/10/2012 1330   HGB 12.4 (L) 05/21/2012 0503   HCT 37.9 (L) 05/21/2012 0503   PLT 239 05/10/2012 1330   MCV 86.1 05/10/2012 1330   MCH 27.6 05/10/2012 1330   MCHC 32.0 05/10/2012 1330   RDW 13.6 05/10/2012 1330     BNP No results found for: PROBNP  Lipid Panel  No results found for: CHOL   RADIOLOGY: No results found.    ASSESSMENT AND PLAN: Mr. Griswold Is a 65 year old gentleman who has been maintaining normal sinus rhythm since beta blocker was started after he developed late postoperative atrial fibrillation following his prostatectomy. He has normal LV function. His left atrium is not dilated.  Prior cardiac catheterization in 2008 revealed only very mild LAD and circumflex narrowing at 20-30%.  He continues to be on Bystolic 5 mg without recurrent AF.  He has tolerated this well.  There is less fatigued.  Unfortunately, this did not alter his erectile dysfunction, which has persisted since his prostatic surgery.  He is on simvastatin 40 mg and niaspan for his hyperlipidemia with extremely low HDL levels and is tolerating this well.  He tells me Dr. Ihor Austin will be checking complete laboratory.  When I last saw him I recommended that he reduce his aspirin dose from 325 mg to 81 mg.  He also is taking omeprazole 20 mg twice a day and has noted some improvement in GERD symptoms.  He was diagnosed with celiac disease and is extremely gluten sensitive.  He is now exercising fairly well.  I will try to obtain blood work done by Dr. Sandi Mariscal.  As long as he remains stable, I will see him in one year for reevaluation.  Time spent: 25 minutes  Troy Sine, MD, [facility]  01/29/2016  6:39 PM

## 2016-04-24 ENCOUNTER — Ambulatory Visit
Admission: RE | Admit: 2016-04-24 | Discharge: 2016-04-24 | Disposition: A | Discharge status: Home / Self Care | Payer: BLUE CROSS/BLUE SHIELD | Source: Ambulatory Visit | Attending: Family Medicine | Admitting: Family Medicine

## 2016-04-24 ENCOUNTER — Other Ambulatory Visit: Payer: Self-pay | Admitting: Family Medicine

## 2016-04-24 DIAGNOSIS — R05 Cough: Secondary | ICD-10-CM

## 2016-04-24 DIAGNOSIS — R059 Cough, unspecified: Secondary | ICD-10-CM

## 2017-02-20 ENCOUNTER — Encounter: Payer: Self-pay | Admitting: Cardiovascular Disease

## 2017-02-20 ENCOUNTER — Ambulatory Visit (INDEPENDENT_AMBULATORY_CARE_PROVIDER_SITE_OTHER): Payer: Medicare Other | Admitting: Cardiovascular Disease

## 2017-02-20 VITALS — BP 100/56 | HR 56 | Ht 67.0 in | Wt 173.0 lb

## 2017-02-20 DIAGNOSIS — I2510 Atherosclerotic heart disease of native coronary artery without angina pectoris: Secondary | ICD-10-CM | POA: Diagnosis not present

## 2017-02-20 DIAGNOSIS — E782 Mixed hyperlipidemia: Secondary | ICD-10-CM

## 2017-02-20 DIAGNOSIS — K219 Gastro-esophageal reflux disease without esophagitis: Secondary | ICD-10-CM | POA: Diagnosis not present

## 2017-02-20 DIAGNOSIS — I480 Paroxysmal atrial fibrillation: Secondary | ICD-10-CM

## 2017-02-20 DIAGNOSIS — K900 Celiac disease: Secondary | ICD-10-CM | POA: Diagnosis not present

## 2017-02-20 DIAGNOSIS — Z8546 Personal history of malignant neoplasm of prostate: Secondary | ICD-10-CM | POA: Diagnosis not present

## 2017-02-20 NOTE — Patient Instructions (Signed)

## 2017-02-20 NOTE — Progress Notes (Signed)
Patient ID: ADRIELL POLANSKY, male   DOB: 1951/01/01, 66 y.o.   MRN: [id number]     PCP: Dr. Derinda Late   HPI: OMARIE PARCELL is a 66 y.o. male who presents for a one year cardiology followup evaluation.   Mr. Cotta has a strong family history for premature coronary artery disease. In May 2006 cardiac catheterization showed very mild LAD and circumflex narrowing felt to be not flow limiting at approximately 20-30%. He has a history of low HDL levels. He developed focal prostate cancer and underwent robotic prostatectomy by Dr. Araceli Bouche in 2014 When I saw him after his prostate surgery in March 2014 he was in atrial fibrillation at which time he was completely unaware of this rhythm disturbance. He was started on eliquis 5 mg twice a day anticoagulation and as well as metoprolol succinate. He subsequently converted spontaneously to sinus rhythm and has been maintaining sinus rhythm since. He remains active. He exercises daily. He denies any chest pain. He denies shortness of breath. He denies palpitations. His echo Doppler study showed an ejection fraction in the 55-65% range. He had  upper normal LA size. There is mild aortic insufficiency. Pulmonary pressures were normal with an estimated RV systolic pressure 15 mm.  In the past, he had been on Niaspan for low HDL levels. This was stopped when he was also put on cialis following his prostate surgery. He no longer takes cialis.  Laboratory done in April 2014 showed cholesterol 131 triglycerides 96 his HDL remains very low at 23 and his LDL was 89. Thyroid function studies were normal as were his hemoglobin hematocrit and chemistries.  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  He denies any episodes of chest pain.  Dr. Sandi Mariscal has put him back on his simvastatin 40 mg and niacin 1000 mg based on laboratory that he had done.  He continues to take Bystolic 5 mg for hypertension.  He continues to have  difficulty with erectile function following his prostate surgery.  He also was diagnosed with celiac disease and is extremely sensitive to gluten.   He remains active.  He is not aware of any recurrent arrhythmia.  Dr. Sandi Mariscal checks laboratory and he was told that his labs most recently were excellent.  He is tolerating Bystolic 5 mg.  He is followed by Dr. Alinda Money for his prostate.  He is on a gluten-free diet.  He presents for one-year evaluation.  Past Medical History:  Diagnosis Date  . Anemia   . Bronchitis 05-10-12   past fall- 4 runs antibiotics due to bronchitis-uses [employer] as needed  . Celiac disease   . Colon polyps    adenomatous  . Diverticulosis   . GERD (gastroesophageal reflux disease)   . Hepatitis 05-10-12   "was told non A, non B"-unclean dental equip.  Marland Kitchen Hyperlipidemia   . Iron deficiency anemia   . Prostate cancer ([place]) 05-10-12   ;bx. 04-12-12, dx  . Spasm of esophagus 2013    Past Surgical History:  Procedure Laterality Date  . CARDIAC CATHETERIZATION  09/13/2004   LAD:30%-40% in prox. to mid segment with 20% in the mid segment and 20% in left Circ., mild 20% right common iliac narrowing. medical therapy  . HERNIA REPAIR  5 yrs ago   right   . NM MYOCAR PERF WALL MOTION  11/30/2007   protocol:Bruce, post EF 67%,mild perfusion defect seen in basal inferior consistant with Attenuation artifact, exercise  cap. 12METS  . ROBOT ASSISTED LAPAROSCOPIC RADICAL PROSTATECTOMY  05/20/2012   Procedure: ROBOTIC ASSISTED LAPAROSCOPIC RADICAL PROSTATECTOMY LEVEL 1;  Surgeon: Dutch Gray, MD;  Location: [facility];  Service: Urology;  Laterality: N/A;     . TRANSURETHRAL RESECTION OF PROSTATE  71yr ago    Allergies  Allergen Reactions  . Gluten Meal   . Wheat Bran   . Sulfa Antibiotics Rash    Current Outpatient Prescriptions  Medication Sig Dispense Refill  . aspirin 81 MG tablet Take 81 mg by mouth daily.    . Multiple Vitamins-Minerals (MULTIVITAMIN PO) Take 1 tablet  by mouth daily.    . nebivolol (BYSTOLIC) 5 MG tablet Take 1 tablet (5 mg total) by mouth daily. NEEDS APPOINTMENT FOR FUTURE REFILLS 30 tablet 0  . niacin (NIASPAN) 1000 MG CR tablet Take 1 tablet by mouth daily.    .Marland Kitchenomeprazole (PRILOSEC) 20 MG capsule Take 20 mg by mouth 2 (two) times daily.    .Marland KitchenPROAIR HFA 108 (90 BASE) MCG/ACT inhaler Take 2 puffs by mouth 2 (two) times daily as needed. For shortness of breath.    . simvastatin (ZOCOR) 40 MG tablet Take 1 tablet by mouth daily.    . Vitamin D, Ergocalciferol, 2000 units CAPS Take 1 capsule by mouth daily.     No current facility-administered medications for this visit.     Social History   Social History  . Marital status: Married    Spouse name: N/A  . Number of children: 3  . Years of education: N/A   Occupational History  .  [employer]   Social History Main Topics  . Smoking status: Never Smoker  . Smokeless tobacco: Never Used  . Alcohol use 1.2 oz/week    2 Glasses of wine per week     Comment: occ. x 2 drinks weekly  . Drug use: No  . Sexual activity: Yes   Other Topics Concern  . Not on file   Social History Narrative  . No narrative on file    Socially, he is a PhD and Is now retired from [employer]  He had been in the turf business and floral arrangements.  ROS General: Negative; No fevers, chills, or night sweats;  HEENT: Negative; No changes in vision or hearing, sinus congestion, difficulty swallowing Pulmonary: Negative; No cough, wheezing, shortness of breath, hemoptysis Cardiovascular: Negative; No chest pain, presyncope, syncope, palpatations GI: Positive for celiac disease GU: Positive for erectile dysfunction since his robotic prostatic surgery No dysuria, hematuria, or difficulty voiding Musculoskeletal: Negative; no myalgias, joint pain, or weakness Hematologic/Oncology: Negative; no easy bruising, bleeding Endocrine: Negative; no heat/cold intolerance; no diabetes Neuro: Negative; no changes in  balance, headaches Skin: Negative; No rashes or skin lesions Psychiatric: Negative; No behavioral problems, depression Sleep: Negative; No snoring, daytime sleepiness, hypersomnolence, bruxism, restless legs, hypnogognic hallucinations, no cataplexy Other comprehensive 14 point system review is negative.   PE BP (!) 100/56 (BP Location: Right Arm, Patient Position: Sitting, Cuff Size: Normal)   Pulse (!) 56   Ht 5' 7"  (1.702 m)   Wt 173 lb (78.5 kg)   BMI 27.10 kg/m    Repeat blood pressure by me was 108/60.  Wt Readings from Last 3 Encounters:  02/20/17 173 lb (78.5 kg)  01/29/16 182 lb 12.8 oz (82.9 kg)  07/25/14 180 lb (81.6 kg)   General: Alert, oriented, no distress.  Skin: normal turgor, no rashes, warm and dry HEENT: Normocephalic, atraumatic. Pupils equal round and reactive  to light; sclera anicteric; extraocular muscles intact;  Nose without nasal septal hypertrophy Mouth/Parynx benign; Mallinpatti scale 2 Neck: No JVD, no carotid bruits; normal carotid upstroke Lungs: clear to ausculatation and percussion; no wheezing or rales Chest wall: without tenderness to palpitation Heart: PMI not displaced, RRR, s1 s2 normal, 1/6 systolic murmur, no diastolic murmur, no rubs, gallops, thrills, or heaves Abdomen: soft, nontender; no hepatosplenomehaly, BS+; abdominal aorta nontender and not dilated by palpation. Back: no CVA tenderness Pulses 2+ Musculoskeletal: full range of motion, normal strength, no joint deformities Extremities: no clubbing cyanosis or edema, Homan's sign negative  Neurologic: grossly nonfocal; Cranial nerves grossly wnl Psychologic: Normal mood and affect   ECG (independently read by me): Sinus bradycardia 56 bpm.  No ectopy.  Normal intervals.  October 2017 ECG (independently read by me): Sinus bradycardia 59 bpm.  Normal intervals.  No ectopy.  May 2015 ECG (and apparently read by me): Normal sinus rhythm at 60 beats per minute.  Early transition.   No ectopy.  PR interval 174 ms, QTc interval 460 ms.  ECG: (independently read by me): Sinus rhythm at 56 beats per minute. No ectopy. Normal intervals.  LABS:  BMP Latest Ref Rng & Units 05/10/2012  Glucose 70 - 99 mg/dL 86  BUN 6 - 23 mg/dL 13  Creatinine 0.50 - 1.35 mg/dL 1.05  Sodium 135 - 145 mEq/L 137  Potassium 3.5 - 5.1 mEq/L 4.5  Chloride 96 - 112 mEq/L 104  CO2 19 - 32 mEq/L 23  Calcium 8.4 - 10.5 mg/dL 8.6    Hepatic Function Panel  No results found for: PROT   CBC Latest Ref Rng & Units 05/21/2012 05/20/2012 05/10/2012  WBC 4.0 - 10.5 K/uL - - 5.9  Hemoglobin 13.0 - 17.0 g/dL 12.4(L) 13.4 13.1  Hematocrit 39.0 - 52.0 % 37.9(L) 41.9 40.9  Platelets 150 - 400 K/uL - - 239    BNP No results found for: PROBNP  Lipid Panel  No results found for: CHOL   RADIOLOGY: No results found.  IMPRESSION:  1. Mild CAD   2. PAF (paroxysmal atrial fibrillation) ([place])   3. Gastroesophageal reflux disease without esophagitis   4. Celiac disease   5. Mixed hyperlipidemia   6. History of prostate cancer     ASSESSMENT AND PLAN: Mr. 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  He has a history of hyperlipidemia with very low HDL levels and is back on niacin in addition to his simvastatin 40 mg followed by Dr. Sandi Mariscal.  He tells me his last laboratory was excellent.  He developed atrial fibrillation for which he was unaware following his robotic prostatectomy for his prostate cancer.  He has been maintaining sinus rhythm without recurrent atrial fibrillation.  He continues to be on Bystolic 5 mg daily, which he is tolerating.  His blood pressure today on this therapy is excellent and on repeat by me was 108/60.  He stays active.  He does some exercise.  He has not had any issues of recurrent chest pain, PND, orthopnea, or palpitations.  He believes he is sleeping well.  He is on a gluten-free  diet for celiac disease.  Last year, reduced his aspirin to 81 mg, which he is tolerating well.  He has omeprazole for GERD.  He has issues with seasonal allergies and over the past spring, had required 3 courses of steroid therapy.  From a cardiac  standpoint he is stable.  I will see him in one year for reevaluation.  Time spent: 25 minutes  Troy Sine, MD, [facility]  02/20/2017  9:28 AM

## 2017-04-22 ENCOUNTER — Encounter: Payer: Self-pay | Admitting: Internal Medicine

## 2017-06-02 ENCOUNTER — Encounter (HOSPITAL_BASED_OUTPATIENT_CLINIC_OR_DEPARTMENT_OTHER): Payer: Self-pay | Admitting: Respiratory Therapy

## 2017-06-02 ENCOUNTER — Emergency Department (HOSPITAL_BASED_OUTPATIENT_CLINIC_OR_DEPARTMENT_OTHER)
Admission: EM | Admit: 2017-06-02 | Discharge: 2017-06-03 | Disposition: A | Discharge status: Home / Self Care | Payer: Medicare Other | Attending: Emergency Medicine | Admitting: Emergency Medicine

## 2017-06-02 ENCOUNTER — Other Ambulatory Visit: Payer: Self-pay

## 2017-06-02 ENCOUNTER — Emergency Department (HOSPITAL_BASED_OUTPATIENT_CLINIC_OR_DEPARTMENT_OTHER): Payer: Medicare Other

## 2017-06-02 DIAGNOSIS — I4891 Unspecified atrial fibrillation: Secondary | ICD-10-CM | POA: Diagnosis not present

## 2017-06-02 DIAGNOSIS — Z7982 Long term (current) use of aspirin: Secondary | ICD-10-CM | POA: Insufficient documentation

## 2017-06-02 DIAGNOSIS — Z8546 Personal history of malignant neoplasm of prostate: Secondary | ICD-10-CM | POA: Insufficient documentation

## 2017-06-02 DIAGNOSIS — I2510 Atherosclerotic heart disease of native coronary artery without angina pectoris: Secondary | ICD-10-CM | POA: Insufficient documentation

## 2017-06-02 DIAGNOSIS — R002 Palpitations: Secondary | ICD-10-CM | POA: Diagnosis present

## 2017-06-02 LAB — CBC
HCT: 43.8 % (ref 39.0–52.0)
HEMOGLOBIN: 14.4 g/dL (ref 13.0–17.0)
MCH: 28.6 pg (ref 26.0–34.0)
MCHC: 32.9 g/dL (ref 30.0–36.0)
MCV: 87.1 fL (ref 78.0–100.0)
Platelets: 204 10*3/uL (ref 150–400)
RBC: 5.03 MIL/uL (ref 4.22–5.81)
RDW: 14 % (ref 11.5–15.5)
WBC: 6.9 10*3/uL (ref 4.0–10.5)

## 2017-06-02 LAB — BASIC METABOLIC PANEL
Anion gap: 9 (ref 5–15)
BUN: 15 mg/dL (ref 6–20)
CHLORIDE: 107 mmol/L (ref 101–111)
CO2: 21 mmol/L — AB (ref 22–32)
Calcium: 8.8 mg/dL — ABNORMAL LOW (ref 8.9–10.3)
Creatinine, Ser: 0.83 mg/dL (ref 0.61–1.24)
GFR calc non Af Amer: >60 mL/min (ref >60)
Glucose, Bld: 116 mg/dL — ABNORMAL HIGH (ref 65–99)
Potassium: 3.9 mmol/L (ref 3.5–5.1)
Sodium: 137 mmol/L (ref 135–145)

## 2017-06-02 LAB — MAGNESIUM: Magnesium: 1.9 mg/dL (ref 1.7–2.4)

## 2017-06-02 MED ORDER — APIXABAN 5 MG PO TABS
5.0000 mg | ORAL_TABLET | Freq: Once | ORAL | Status: DC
  Filled 2017-06-02: qty 1

## 2017-06-02 MED ORDER — RIVAROXABAN 20 MG PO TABS: 20.0000 mg | ORAL_TABLET | Freq: Every day | ORAL | 0 refills | Status: DC

## 2017-06-02 MED ORDER — PROPOFOL 10 MG/ML IV BOLUS
1.0000 mg/kg | Freq: Once | INTRAVENOUS | Status: AC
  Administered 2017-06-02: 73.5 mg via INTRAVENOUS
  Filled 2017-06-02: qty 20

## 2017-06-02 MED ORDER — RIVAROXABAN 20 MG PO TABS
20.0000 mg | ORAL_TABLET | Freq: Once | ORAL | Status: AC
  Administered 2017-06-02: 20 mg via ORAL
  Filled 2017-06-02: qty 1

## 2017-06-02 NOTE — ED Triage Notes (Signed)
Heart palpitations this afternoon. Hx of atrial fib. His heart medication was changed earlier this month per wife. Chest pain.

## 2017-06-02 NOTE — ED Provider Notes (Signed)
[facility] EMERGENCY DEPARTMENT Provider Note   CSN: [id number] Arrival date & time: 06/02/17  2109     History   Chief Complaint Chief Complaint  Patient presents with  . Palpitations    HPI Jose Warner is a 67 y.o. male.  Patient developed palpitations feeling of irregular heartbeat 7 PM tonight.  Denies shortness of breath denies lightheadedness.  Ports intermittent chest pains over the past 2 weeks, lasting 10 seconds at a time.  Left-sided anterior.  He is presently pain-free.  Denies any shortness of breath or nausea or sweatiness.  No other associated symptoms.  No treatment prior to coming here  HPI  Past Medical History:  Diagnosis Date  . Anemia   . Bronchitis 05-10-12   past fall- 4 runs antibiotics due to bronchitis-uses [employer] as needed  . Celiac disease   . Colon polyps    adenomatous  . Diverticulosis   . GERD (gastroesophageal reflux disease)   . Hepatitis 05-10-12   "was told non A, non B"-unclean dental equip.  Marland Kitchen Hyperlipidemia   . Iron deficiency anemia   . Prostate cancer ([place]) 05-10-12   ;bx. 04-12-12, dx  . Spasm of esophagus 2013    Patient Active Problem List   Diagnosis Date Noted  . 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 FHx: coronary artery disease 11/26/2012  . H/O prostate cancer 11/26/2012  . Low HDL (under 40) 11/26/2012  . Paroxysmal atrial fibrillation ([place]) 11/26/2012  . CAD (coronary artery disease) 11/26/2012    Past Surgical History:  Procedure Laterality Date  . CARDIAC CATHETERIZATION  09/13/2004   LAD:30%-40% in prox. to mid segment with 20% in the mid segment and 20% in left Circ., mild 20% right common iliac narrowing. medical therapy  . HERNIA REPAIR  5 yrs ago   right   . NM MYOCAR PERF WALL MOTION  11/30/2007   protocol:Bruce, post EF 67%,mild perfusion defect seen in basal inferior consistant with Attenuation artifact, exercise cap. 12METS  . ROBOT ASSISTED LAPAROSCOPIC  RADICAL PROSTATECTOMY  05/20/2012   Procedure: ROBOTIC ASSISTED LAPAROSCOPIC RADICAL PROSTATECTOMY LEVEL 1;  Surgeon: Dutch Gray, MD;  Location: [facility];  Service: Urology;  Laterality: N/A;     . TRANSURETHRAL RESECTION OF PROSTATE  48yrs ago    History of atrial fibrillation.  Formally on Eliquis.  Taken off Eliquis 4 years ago she had no further episodes of atrial fibrillation   Home Medications    Prior to Admission medications   Medication Sig Start Date End Date Taking? Authorizing Provider  aspirin 81 MG tablet Take 81 mg by mouth daily.   Yes [provider]  bisoprolol (ZEBETA) 10 MG tablet Take 10 mg by mouth daily.   Yes [provider]  Multiple Vitamins-Minerals (MULTIVITAMIN PO) Take 1 tablet by mouth daily.   Yes [provider]  niacin (NIASPAN) 1000 MG CR tablet Take 1 tablet by mouth daily. 12/04/15  Yes [provider]  omeprazole (PRILOSEC) 20 MG capsule Take 20 mg by mouth 2 (two) times daily.   Yes [provider]  PROAIR HFA 108 (90 BASE) MCG/ACT inhaler Take 2 puffs by mouth 2 (two) times daily as needed. For shortness of breath. 02/13/12  Yes [provider]  simvastatin (ZOCOR) 40 MG tablet Take 1 tablet by mouth daily. 11/02/15  Yes [provider]  Vitamin D, Ergocalciferol, 2000 units CAPS Take 1 capsule by mouth daily.   Yes [provider]  nebivolol (  BYSTOLIC) 5 MG tablet Take 1 tablet (5 mg total) by mouth daily. NEEDS APPOINTMENT FOR FUTURE REFILLS 09/26/14   Troy Sine, MD    Family History Family History  Problem Relation Age of Onset  . Stroke Mother   . Heart disease Father   . Heart attack Father   . Cancer Sister   . Stroke Maternal Grandfather   . Cancer Paternal Grandmother   . Heart attack Paternal Grandfather   . Heart disease Paternal Grandfather   . Colon cancer Neg Hx   . Esophageal cancer Neg Hx   . Pancreatic cancer Neg Hx   . Rectal cancer Neg Hx   . Stomach  cancer Neg Hx     Social History Social History   Tobacco Use  . Smoking status: Never Smoker  . Smokeless tobacco: Never Used  Substance Use Topics  . Alcohol use: Yes    Alcohol/week: 1.2 oz    Types: 2 Glasses of wine per week    Comment: occ. x 2 drinks weekly  . Drug use: No     Allergies   Gluten meal; Wheat bran; and Sulfa antibiotics   Review of Systems Review of Systems  Constitutional: Negative.   HENT: Negative.   Respiratory: Negative.   Cardiovascular: Positive for chest pain and palpitations.  Gastrointestinal: Negative.   Musculoskeletal: Negative.   Skin: Negative.   Neurological: Negative.   Psychiatric/Behavioral: Negative.   All other systems reviewed and are negative.    Physical Exam Updated Vital Signs BP 127/66 (BP Location: Left Arm)   Pulse 63   Temp 97.7 F (36.5 C) (Oral)   Resp 20   Ht 5\' 7"  (1.702 m)   Wt 73.5 kg (162 lb)   SpO2 100%   BMI 25.37 kg/m   Physical Exam  Constitutional: He appears well-developed and well-nourished.  HENT:  Head: Normocephalic and atraumatic.  Eyes: Conjunctivae are normal. Pupils are equal, round, and reactive to light.  Neck: Neck supple. No tracheal deviation present. No thyromegaly present.  Cardiovascular:  No murmur heard. Tachycardic irregularly irregular  Pulmonary/Chest: Effort normal and breath sounds normal.  Abdominal: Soft. Bowel sounds are normal. He exhibits no distension. There is no tenderness.  Musculoskeletal: Normal range of motion. He exhibits no edema or tenderness.  Neurological: He is alert. Coordination normal.  Skin: Skin is warm and dry. No rash noted.  Psychiatric: He has a normal mood and affect.  Nursing note and vitals reviewed.    ED Treatments / Results  Labs (all labs ordered are listed, but only abnormal results are displayed) Labs Reviewed  BASIC METABOLIC PANEL - Abnormal; Notable for the following components:      Result Value   CO2 21 (*)     Glucose, Bld 116 (*)    Calcium 8.8 (*)    All other components within normal limits  CBC  MAGNESIUM    EKG  EKG Interpretation  Date/Time:  Tuesday June 02 2017 21:11:55 EST Ventricular Rate:  119 PR Interval:    QRS Duration: 92 QT Interval:  318 QTC Calculation: 447 R Axis:   32 Text Interpretation:  Atrial fibrillation with rapid ventricular response Abnormal ECG Confirmed by Orlie Dakin [phone]) on 06/02/2017 9:24:35 PM       Date: 06/02/2017  2253 a.m. post cardioversion  Rate: 70  Rhythm: normal sinus rhythm  QRS Axis: normal  Intervals: normal  ST/T Wave abnormalities: normal  Conduction Disutrbances: none  Narrative Interpretation: unremarkable  Results for orders placed or performed during the hospital encounter of 06/02/17  CBC  Result Value Ref Range   WBC 6.9 4.0 - 10.5 K/uL   RBC 5.03 4.22 - 5.81 MIL/uL   Hemoglobin 14.4 13.0 - 17.0 g/dL   HCT 43.8 39.0 - 52.0 %   MCV 87.1 78.0 - 100.0 fL   MCH 28.6 26.0 - 34.0 pg   MCHC 32.9 30.0 - 36.0 g/dL   RDW 14.0 11.5 - 15.5 %   Platelets 204 150 - 400 K/uL  Basic metabolic panel  Result Value Ref Range   Sodium 137 135 - 145 mmol/L   Potassium 3.9 3.5 - 5.1 mmol/L   Chloride 107 101 - 111 mmol/L   CO2 21 (L) 22 - 32 mmol/L   Glucose, Bld 116 (H) 65 - 99 mg/dL   BUN 15 6 - 20 mg/dL   Creatinine, Ser 0.83 0.61 - 1.24 mg/dL   Calcium 8.8 (L) 8.9 - 10.3 mg/dL   GFR calc non Af Amer >60 >60 mL/min   GFR calc Af Amer >60 >60 mL/min   Anion gap 9 5 - 15  Magnesium  Result Value Ref Range   Magnesium 1.9 1.7 - 2.4 mg/dL   Dg Chest Port 1 View  Result Date: 06/02/2017 CLINICAL DATA:  Heart palpitations EXAM: PORTABLE CHEST 1 VIEW COMPARISON:  Chest radiograph 04/24/2016 FINDINGS: The heart size and mediastinal contours are within normal limits. Both lungs are clear. The visualized skeletal structures are unremarkable. IMPRESSION: No active disease. Electronically Signed   By: Ulyses Jarred M.D.   On:  06/02/2017 21:51   Radiology Dg Chest Port 1 View  Result Date: 06/02/2017 CLINICAL DATA:  Heart palpitations EXAM: PORTABLE CHEST 1 VIEW COMPARISON:  Chest radiograph 04/24/2016 FINDINGS: The heart size and mediastinal contours are within normal limits. Both lungs are clear. The visualized skeletal structures are unremarkable. IMPRESSION: No active disease. Electronically Signed   By: Ulyses Jarred M.D.   On: 06/02/2017 21:51    Procedures .Sedation Date/Time: 06/02/2017 10:10 PM Performed by: Orlie Dakin, MD Authorized by: Orlie Dakin, MD   Consent:    Consent obtained:  Verbal and written   Consent given by:  Patient   Risks discussed:  Allergic reaction, dysrhythmia, inadequate sedation, prolonged hypoxia resulting in organ damage, prolonged sedation necessitating reversal, respiratory compromise necessitating ventilatory assistance and intubation and vomiting   Alternatives discussed:  Analgesia without sedation, anxiolysis and regional anesthesia Universal protocol:    Procedure explained and questions answered to patient or proxy's satisfaction: yes     Relevant documents present and verified: yes     Test results available and properly labeled: yes     Imaging studies available: yes     Required blood products, implants, devices, and special equipment available: yes     Site/side marked: yes     Immediately prior to procedure a time out was called: yes     Patient identity confirmation method:  Verbally with patient, arm band and hospital-assigned identification number Indications:    Procedure performed:  Cardioversion   Procedure necessitating sedation performed by:  Physician performing sedation   Intended level of sedation:  Deep Pre-sedation assessment:    Time since last food or drink:  4 hours 50 minutes   ASA classification: class 2 - patient with mild systemic disease     Neck mobility: normal     Mouth opening:  3 or more finger widths   Thyromental  distance:  4 finger widths   Mallampati score:  I - soft palate, uvula, fauces, pillars visible   Pre-sedation assessments completed and reviewed: airway patency, cardiovascular function, hydration status, mental status, nausea/vomiting, pain level, respiratory function and temperature     Pre-sedation assessment completed:  06/02/2017 10:10 PM Immediate pre-procedure details:    Reassessment: Patient reassessed immediately prior to procedure     Reviewed: vital signs, relevant labs/tests and NPO status     Verified: bag valve mask available, emergency equipment available, intubation equipment available, IV patency confirmed, oxygen available and suction available   Procedure details (see MAR for exact dosages):    Preoxygenation:  Nasal cannula   Sedation:  Propofol   Intra-procedure monitoring:  Blood pressure monitoring, cardiac monitor, continuous pulse oximetry, frequent [name] assessments, frequent vital sign checks and continuous capnometry   Intra-procedure events: none     Total Provider sedation time (minutes):  10 Post-procedure details:    Post-sedation assessment completed:  06/02/2017 10:40 PM   Attendance: Constant attendance by certified staff until patient recovered     Recovery: Patient returned to pre-procedure baseline     Post-sedation assessments completed and reviewed: airway patency, cardiovascular function, hydration status, mental status, nausea/vomiting, pain level, respiratory function and temperature     Patient is stable for discharge or admission: yes     Patient tolerance:  Tolerated well, no immediate complications .Cardioversion Date/Time: 06/02/2017 11:38 PM Performed by: Orlie Dakin, MD Authorized by: Orlie Dakin, MD   Consent:    Consent obtained:  Written and verbal   Consent given by:  Patient   Risks discussed:  Cutaneous burn, induced arrhythmia and pain   Alternatives discussed:  No treatment, rate-control medication, referral, delayed  treatment and observation Pre-procedure details:    Cardioversion basis:  Emergent   Rhythm:  Atrial fibrillation   Electrode placement:  Anterior-posterior Patient sedated: Yes. Refer to sedation procedure documentation for details of sedation.  Attempt one:    Cardioversion mode:  Synchronous   Shock (Joules):  200   Shock outcome:  Conversion to normal sinus rhythm Post-procedure details:    Patient status:  Alert   Patient tolerance of procedure:  Tolerated well, no immediate complications   (including critical care time)  Medications Ordered in ED Medications  propofol (DIPRIVAN) 10 mg/mL bolus/IV push 73.5 mg (73.5 mg Intravenous Given 06/02/17 2221)  rivaroxaban (XARELTO) tablet 20 mg (20 mg Oral Given 06/02/17 2327)   chestX-ray viewed by me  Initial Impression / Assessment and Plan / ED Course  I have reviewed the triage vital signs and the nursing notes.  Pertinent labs & imaging results that were available during my care of the patient were reviewed by me and considered in my medical decision making (see chart for details).     I spoke with [facility] cardiology who suggested prescription for Eliquis however we do not have Eliquis here patient will be given prescription for Xarelto 20 mg daily with supper.  Given first dose here.  Dr. Teena Dunk also suggested prescription for metoprolol 25 mg daily however patient's heart rate is in the 60s post cardioversion.  He will be referred to atrial fibrillation clinic  Cha2ds-vasc score =1. At 11:40 PM patient is alert awake asymptomatic and in no distress Final Clinical Impressions(s) / ED Diagnoses  Diagnosis atrial fibrillation with rapid ventricular response CRITICAL CARE Performed by: Orlie Dakin Total critical care time: 30 minutes Critical care time was exclusive of separately billable procedures and treating other patients. Critical care  was necessary to treat or prevent imminent or life-threatening  deterioration. Critical care was time spent personally by me on the following activities: development of treatment plan with patient and/or surrogate as well as nursing, discussions with consultants, evaluation of patient's response to treatment, examination of patient, obtaining history from patient or surrogate, ordering and performing treatments and interventions, ordering and review of laboratory studies, ordering and review of radiographic studies, pulse oximetry and re-evaluation of patient's condition. Final diagnoses:  None    ED Discharge Orders        Ordered    Amb referral to AFIB Clinic     06/02/17 2133    Amb referral to AFIB Clinic     06/02/17 2204       Orlie Dakin, MD 06/02/17 2346

## 2017-06-02 NOTE — ED Notes (Signed)
Contacted [facility]) to page [facility]

## 2017-06-02 NOTE — Sedation Documentation (Signed)
Pt cardioverted at 200J, synced.

## 2017-06-02 NOTE — Discharge Instructions (Addendum)
Start to take the Xarelto prescribed tomorrow.  Call the atrial fibrillation clinic tomorrow to schedule the next available appointment.

## 2017-06-03 MED FILL — XARELTO 20 MG TABLET: 20 | 30 days supply | Qty: 30 | Fill #0

## 2017-06-04 ENCOUNTER — Telehealth (HOSPITAL_COMMUNITY): Payer: Self-pay | Admitting: *Deleted

## 2017-06-04 NOTE — Telephone Encounter (Signed)
Pt daughter cld stating that pt is not feeling well, just tired and run down.  She denied chest pain, SOB, dizziness, nausea or fever for him.  His HR is 86 and she was not able to get his bp due to no bp cuff at home.  She stated that he did lay down to rest.  She just wanted to know if he can wait to be seen until tomorrow.   Per Roderic Palau, NP she was not hearing any red flags based on the info given, but recommended that she call a provider that has recently seen him that is more familiar with him since she has not physically met him.  Pts daughter understood this and thanked [place] for reassurance and will keep appt tomorrow and go to ER if symptoms worsen and call PCP for advisement

## 2017-06-05 ENCOUNTER — Ambulatory Visit (HOSPITAL_COMMUNITY)
Admission: RE | Admit: 2017-06-05 | Discharge: 2017-06-05 | Disposition: A | Discharge status: Home / Self Care | Payer: Medicare Other | Source: Ambulatory Visit | Attending: Nurse Practitioner | Admitting: Nurse Practitioner

## 2017-06-05 ENCOUNTER — Encounter (HOSPITAL_COMMUNITY): Payer: Self-pay | Admitting: Nurse Practitioner

## 2017-06-05 VITALS — BP 106/64 | HR 48 | Ht 67.0 in | Wt 170.0 lb

## 2017-06-05 DIAGNOSIS — Z8546 Personal history of malignant neoplasm of prostate: Secondary | ICD-10-CM | POA: Insufficient documentation

## 2017-06-05 DIAGNOSIS — Z7982 Long term (current) use of aspirin: Secondary | ICD-10-CM | POA: Insufficient documentation

## 2017-06-05 DIAGNOSIS — K900 Celiac disease: Secondary | ICD-10-CM | POA: Diagnosis not present

## 2017-06-05 DIAGNOSIS — K219 Gastro-esophageal reflux disease without esophagitis: Secondary | ICD-10-CM | POA: Diagnosis not present

## 2017-06-05 DIAGNOSIS — Z79899 Other long term (current) drug therapy: Secondary | ICD-10-CM | POA: Insufficient documentation

## 2017-06-05 DIAGNOSIS — E785 Hyperlipidemia, unspecified: Secondary | ICD-10-CM | POA: Diagnosis not present

## 2017-06-05 DIAGNOSIS — I480 Paroxysmal atrial fibrillation: Secondary | ICD-10-CM | POA: Insufficient documentation

## 2017-06-05 DIAGNOSIS — D509 Iron deficiency anemia, unspecified: Secondary | ICD-10-CM | POA: Insufficient documentation

## 2017-06-05 DIAGNOSIS — Z7901 Long term (current) use of anticoagulants: Secondary | ICD-10-CM | POA: Insufficient documentation

## 2017-06-05 DIAGNOSIS — Z882 Allergy status to sulfonamides status: Secondary | ICD-10-CM | POA: Diagnosis not present

## 2017-06-05 DIAGNOSIS — I4819 Other persistent atrial fibrillation: Secondary | ICD-10-CM

## 2017-06-05 DIAGNOSIS — I4891 Unspecified atrial fibrillation: Secondary | ICD-10-CM | POA: Diagnosis present

## 2017-06-05 HISTORY — DX: Other persistent atrial fibrillation: I48.19

## 2017-06-05 HISTORY — PX: CARDIOVERSION: EP1203

## 2017-06-05 MED ORDER — RIVAROXABAN 20 MG PO TABS: 20.0000 mg | ORAL_TABLET | Freq: Every day | ORAL | 6 refills | Status: DC

## 2017-06-05 NOTE — Progress Notes (Signed)
Primary Care Physician: Derinda Late, MD Referring Physician: [place] F/u   Jose Warner is a 67 y.o. male with a h/o first onset  paroxysmal afib in 2014 following prostate surgery. Afib has been quiet since then. He was on eliquis for a period of time and then stopped for a low chadsvasc score.  He developed irregular heart beat 2/12 in the late afternoon and felt lightheaded and had some mild chest discomfort. He presented to the ER with an EKG showing afib at 119 bpm. He was cardioverted successfully and presents today with sinus brady at 48 bpm, not symptomatic with this and HR usually runs in the 50's. Trigger  may have been a recent URI treated with decongestants a week before. Also, the end of January he was switched form Bystolic 10 mg to bisoprolol due to cost. Denies snoring history, drinks one glass a wine a night, minimal caffeine.  Is active and not obese. He was started on xarelto 20 mg a day for chadsvasc score of 2  (age, cad)   Today, he denies symptoms of palpitations, chest pain, shortness of breath, orthopnea, PND, lower extremity edema, dizziness, presyncope, syncope, or neurologic sequela. The patient is tolerating medications without difficulties and is otherwise without complaint today.   Past Medical History:  Diagnosis Date  . Anemia   . Bronchitis 05-10-12   past fall- 4 runs antibiotics due to bronchitis-uses [employer] as needed  . Celiac disease   . Colon polyps    adenomatous  . Diverticulosis   . GERD (gastroesophageal reflux disease)   . Hepatitis 05-10-12   "was told non A, non B"-unclean dental equip.  Marland Kitchen Hyperlipidemia   . Iron deficiency anemia   . Prostate cancer ([place]) 05-10-12   ;bx. 04-12-12, dx  . Spasm of esophagus 2013   Past Surgical History:  Procedure Laterality Date  . CARDIAC CATHETERIZATION  09/13/2004   LAD:30%-40% in prox. to mid segment with 20% in the mid segment and 20% in left Circ., mild 20% right common iliac  narrowing. medical therapy  . HERNIA REPAIR  5 yrs ago   right   . NM MYOCAR PERF WALL MOTION  11/30/2007   protocol:Bruce, post EF 67%,mild perfusion defect seen in basal inferior consistant with Attenuation artifact, exercise cap. 12METS  . ROBOT ASSISTED LAPAROSCOPIC RADICAL PROSTATECTOMY  05/20/2012   Procedure: ROBOTIC ASSISTED LAPAROSCOPIC RADICAL PROSTATECTOMY LEVEL 1;  Surgeon: Dutch Gray, MD;  Location: [facility];  Service: Urology;  Laterality: N/A;     . TRANSURETHRAL RESECTION OF PROSTATE  13yrs ago    Current Outpatient Medications  Medication Sig Dispense Refill  . aspirin 81 MG tablet Take 81 mg by mouth daily.    . bisoprolol (ZEBETA) 10 MG tablet Take 10 mg by mouth daily.    . Multiple Vitamins-Minerals (MULTIVITAMIN PO) Take 1 tablet by mouth daily.    . niacin (NIASPAN) 1000 MG CR tablet Take 1 tablet by mouth daily.    Marland Kitchen omeprazole (PRILOSEC) 20 MG capsule Take 20 mg by mouth 2 (two) times daily.    Marland Kitchen PROAIR HFA 108 (90 BASE) MCG/ACT inhaler Take 2 puffs by mouth 2 (two) times daily as needed. For shortness of breath.    . rivaroxaban (XARELTO) 20 MG TABS tablet Take 1 tablet (20 mg total) by mouth daily with supper. 30 tablet 0  . simvastatin (ZOCOR) 40 MG tablet Take 1 tablet by mouth daily.    . Vitamin D, Ergocalciferol,  2000 units CAPS Take 1 capsule by mouth daily.     No current facility-administered medications for this encounter.     Allergies  Allergen Reactions  . Gluten Meal   . Wheat Bran   . Sulfa Antibiotics Rash    Social History   Socioeconomic History  . Marital status: Married    Spouse name: Not on file  . Number of children: 3  . Years of education: Not on file  . Highest education level: Not on file  Social Needs  . Financial resource strain: Not on file  . Food insecurity - worry: Not on file  . Food insecurity - inability: Not on file  . Transportation needs - medical: Not on file  . Transportation needs - non-medical: Not on file    Occupational History    Employer: [employer]  Tobacco Use  . Smoking status: Never Smoker  . Smokeless tobacco: Never Used  Substance and Sexual Activity  . Alcohol use: Yes    Alcohol/week: 1.2 oz    Types: 2 Glasses of wine per week    Comment: occ. x 2 drinks weekly  . Drug use: No  . Sexual activity: Yes  Other Topics Concern  . Not on file  Social History Narrative  . Not on file    Family History  Problem Relation Age of Onset  . Stroke Mother   . Heart disease Father   . Heart attack Father   . Cancer Sister   . Stroke Maternal Grandfather   . Cancer Paternal Grandmother   . Heart attack Paternal Grandfather   . Heart disease Paternal Grandfather   . Colon cancer Neg Hx   . Esophageal cancer Neg Hx   . Pancreatic cancer Neg Hx   . Rectal cancer Neg Hx   . Stomach cancer Neg Hx     ROS- All systems are reviewed and negative except as per the HPI above  Physical Exam: Vitals:   06/05/17 0924  BP: 106/64  Pulse: (!) 48  Weight: 170 lb (77.1 kg)  Height: 5\' 7"  (1.702 m)   Wt Readings from Last 3 Encounters:  06/05/17 170 lb (77.1 kg)  06/02/17 162 lb (73.5 kg)  02/20/17 173 lb (78.5 kg)    Labs: Lab Results  Component Value Date   NA 137 06/02/2017   K 3.9 06/02/2017   CL 107 06/02/2017   CO2 21 (L) 06/02/2017   GLUCOSE 116 (H) 06/02/2017   BUN 15 06/02/2017   CREATININE 0.83 06/02/2017   CALCIUM 8.8 (L) 06/02/2017   MG 1.9 06/02/2017   No results found for: INR No results found for: CHOL, HDL, LDLCALC, TRIG   GEN- The patient is well appearing, alert and oriented x 3 today.   Head- normocephalic, atraumatic Eyes-  Sclera clear, conjunctiva pink Ears- hearing intact Oropharynx- clear Neck- supple, no JVP Lymph- no cervical lymphadenopathy Lungs- Clear to ausculation bilaterally, normal work of breathing Heart- Slow, regular rate and rhythm, no murmurs, rubs or gallops, PMI not laterally displaced GI- soft, NT, ND, + BS Extremities- no  clubbing, cyanosis, or edema MS- no significant deformity or atrophy Skin- no rash or lesion Psych- euthymic mood, full affect Neuro- strength and sensation are intact  EKG- Sinus brady at 48 bpm, pr int 164 ms, qrs int 82 int, qtc, 396 ms Epic records reviewed    Assessment and Plan: 1. Paroxysmal afib  Successfully cardioverted and is maintaining  SR Continue bisoprolol 10 mg a day, watch  for symptomatic bradycardia Continue xarelto 20 mg daily for chadsvasc score of 2(age/CAD), pt made aware that he has to take at least 4 weeks, can discuss with Dr. Claiborne Billings longer duration after that, per guidelines indefinitely, but not sure if pt is willing to do this Bleeding precautions discussed Stop asa May benefit from updated echo and stress test but will request an appointment with Dr. Claiborne Billings for f/u within the month, and will defer to his judgement   Butch Penny C. Izabellah Dadisman, [facility] [address] [place], [place] [address] [phone]

## 2017-06-09 ENCOUNTER — Emergency Department (HOSPITAL_BASED_OUTPATIENT_CLINIC_OR_DEPARTMENT_OTHER): Payer: Medicare Other

## 2017-06-09 ENCOUNTER — Encounter (HOSPITAL_BASED_OUTPATIENT_CLINIC_OR_DEPARTMENT_OTHER): Payer: Self-pay

## 2017-06-09 ENCOUNTER — Other Ambulatory Visit: Payer: Self-pay

## 2017-06-09 ENCOUNTER — Inpatient Hospital Stay (HOSPITAL_BASED_OUTPATIENT_CLINIC_OR_DEPARTMENT_OTHER)
Admission: EM | Admit: 2017-06-09 | Discharge: 2017-06-12 | DRG: 247 | Disposition: A | Discharge status: Home / Self Care | Payer: Medicare Other | Attending: Family Medicine | Admitting: Family Medicine

## 2017-06-09 DIAGNOSIS — Z8249 Family history of ischemic heart disease and other diseases of the circulatory system: Secondary | ICD-10-CM

## 2017-06-09 DIAGNOSIS — E786 Lipoprotein deficiency: Secondary | ICD-10-CM | POA: Diagnosis present

## 2017-06-09 DIAGNOSIS — Z8546 Personal history of malignant neoplasm of prostate: Secondary | ICD-10-CM

## 2017-06-09 DIAGNOSIS — K5790 Diverticulosis of intestine, part unspecified, without perforation or abscess without bleeding: Secondary | ICD-10-CM | POA: Diagnosis present

## 2017-06-09 DIAGNOSIS — K219 Gastro-esophageal reflux disease without esophagitis: Secondary | ICD-10-CM | POA: Diagnosis present

## 2017-06-09 DIAGNOSIS — I480 Paroxysmal atrial fibrillation: Secondary | ICD-10-CM | POA: Diagnosis present

## 2017-06-09 DIAGNOSIS — K900 Celiac disease: Secondary | ICD-10-CM | POA: Diagnosis present

## 2017-06-09 DIAGNOSIS — Z7901 Long term (current) use of anticoagulants: Secondary | ICD-10-CM

## 2017-06-09 DIAGNOSIS — D649 Anemia, unspecified: Secondary | ICD-10-CM | POA: Diagnosis present

## 2017-06-09 DIAGNOSIS — Z79899 Other long term (current) drug therapy: Secondary | ICD-10-CM

## 2017-06-09 DIAGNOSIS — I25110 Atherosclerotic heart disease of native coronary artery with unstable angina pectoris: Principal | ICD-10-CM | POA: Diagnosis present

## 2017-06-09 DIAGNOSIS — R079 Chest pain, unspecified: Secondary | ICD-10-CM | POA: Diagnosis present

## 2017-06-09 DIAGNOSIS — Z91018 Allergy to other foods: Secondary | ICD-10-CM

## 2017-06-09 DIAGNOSIS — E782 Mixed hyperlipidemia: Secondary | ICD-10-CM | POA: Diagnosis present

## 2017-06-09 DIAGNOSIS — Z955 Presence of coronary angioplasty implant and graft: Secondary | ICD-10-CM

## 2017-06-09 DIAGNOSIS — I2510 Atherosclerotic heart disease of native coronary artery without angina pectoris: Secondary | ICD-10-CM | POA: Diagnosis present

## 2017-06-09 DIAGNOSIS — Z9861 Coronary angioplasty status: Secondary | ICD-10-CM

## 2017-06-09 DIAGNOSIS — Z9079 Acquired absence of other genital organ(s): Secondary | ICD-10-CM

## 2017-06-09 DIAGNOSIS — I10 Essential (primary) hypertension: Secondary | ICD-10-CM | POA: Diagnosis present

## 2017-06-09 DIAGNOSIS — R7303 Prediabetes: Secondary | ICD-10-CM | POA: Diagnosis present

## 2017-06-09 DIAGNOSIS — Z882 Allergy status to sulfonamides status: Secondary | ICD-10-CM

## 2017-06-09 LAB — CBC
HCT: 44.2 % (ref 39.0–52.0)
Hemoglobin: 14.6 g/dL (ref 13.0–17.0)
MCH: 29 pg (ref 26.0–34.0)
MCHC: 33 g/dL (ref 30.0–36.0)
MCV: 87.9 fL (ref 78.0–100.0)
Platelets: 190 10*3/uL (ref 150–400)
RBC: 5.03 MIL/uL (ref 4.22–5.81)
RDW: 14 % (ref 11.5–15.5)
WBC: 6.8 10*3/uL (ref 4.0–10.5)

## 2017-06-09 LAB — BASIC METABOLIC PANEL
ANION GAP: 11 (ref 5–15)
BUN: 12 mg/dL (ref 6–20)
CALCIUM: 8.8 mg/dL — AB (ref 8.9–10.3)
CO2: 23 mmol/L (ref 22–32)
Chloride: 102 mmol/L (ref 101–111)
Creatinine, Ser: 1.01 mg/dL (ref 0.61–1.24)
GFR calc Af Amer: >60 mL/min (ref >60)
GLUCOSE: 80 mg/dL (ref 65–99)
Potassium: 3.5 mmol/L (ref 3.5–5.1)
Sodium: 136 mmol/L (ref 135–145)

## 2017-06-09 LAB — TROPONIN I

## 2017-06-09 MED ORDER — NITROGLYCERIN 0.4 MG SL SUBL
0.4000 mg | SUBLINGUAL_TABLET | SUBLINGUAL | Status: DC | PRN
  Administered 2017-06-09 – 2017-06-10 (×5): 0.4 mg via SUBLINGUAL
  Filled 2017-06-09: qty 1

## 2017-06-09 MED ORDER — ACETAMINOPHEN 325 MG PO TABS
650.0000 mg | ORAL_TABLET | Freq: Once | ORAL | Status: AC
Start: 2017-06-09 — End: 2017-06-10
  Administered 2017-06-10: 650 mg via ORAL
  Filled 2017-06-09 (×2): qty 2

## 2017-06-09 MED ORDER — MORPHINE SULFATE (PF) 2 MG/ML IV SOLN
2.0000 mg | INTRAVENOUS | Status: DC | PRN
  Administered 2017-06-10 – 2017-06-11 (×2): 2 mg via INTRAVENOUS
  Filled 2017-06-09 (×2): qty 1

## 2017-06-09 MED ORDER — NITROGLYCERIN 2 % TD OINT
1.0000 [in_us] | TOPICAL_OINTMENT | Freq: Once | TRANSDERMAL | Status: AC
  Administered 2017-06-09: 1 [in_us] via TOPICAL
  Filled 2017-06-09: qty 1

## 2017-06-09 MED ORDER — ASPIRIN 81 MG PO CHEW
324.0000 mg | CHEWABLE_TABLET | Freq: Once | ORAL | Status: AC
  Administered 2017-06-09: 324 mg via ORAL
  Filled 2017-06-09: qty 4

## 2017-06-09 MED ORDER — NITROGLYCERIN 0.4 MG SL SUBL
SUBLINGUAL_TABLET | SUBLINGUAL | Status: AC
  Administered 2017-06-10: 0.4 mg via SUBLINGUAL
  Filled 2017-06-09: qty 3

## 2017-06-09 NOTE — ED Notes (Signed)
Spoke with pts wife-maryann-updated her on [facility] transfer

## 2017-06-09 NOTE — ED Provider Notes (Signed)
[facility] Provider Note   CSN: [id number] Arrival date & time: 06/09/17  1918     History   Chief Complaint Chief Complaint  Patient presents with  . Chest Pain    HPI Jose Warner is a 67 y.o. male.  HPI  67 year old male with a history of hyperlipidemia and prostate cancer as well as recent A. fib with cardioversion 1 week ago presents with chest pain.  He describes it as a heaviness.  Started around 4 PM tonight while he was sitting down.  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  Since then he had a couple episodes of much less severe pain.  Tonight was fairly severe and he rated about an 8 out of 10.  He was given nitroglycerin prior to me seeing him and states the pain is essentially gone.  He has some associated shortness of breath and a warm feeling in his head but he did not have any sweating.  He feels bloated but has not had nausea or vomiting.  The pain does not radiate.  It is in his inferior sternum. Nurse reports he was pale prior to being given nitro on arrival.  Past Medical History:  Diagnosis Date  . Anemia   . Bronchitis 05-10-12   past fall- 4 runs antibiotics due to bronchitis-uses [employer] as needed  . Celiac disease   . Colon polyps    adenomatous  . Diverticulosis   . GERD (gastroesophageal reflux disease)   . Hepatitis 05-10-12   "was told non A, non B"-unclean dental equip.  Marland Kitchen Hyperlipidemia   . Iron deficiency anemia   . Prostate cancer ([place]) 05-10-12   ;bx. 04-12-12, dx  . Spasm of esophagus 2013    Patient Active Problem List   Diagnosis Date Noted  . Chest pain 06/09/2017  . 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 FHx: coronary artery disease 11/26/2012  . H/O prostate cancer 11/26/2012  . Low HDL (under 40) 11/26/2012  . Paroxysmal atrial fibrillation ([place]) 11/26/2012  . CAD (coronary artery disease) 11/26/2012    Past Surgical History:  Procedure  Laterality Date  . CARDIAC CATHETERIZATION  09/13/2004   LAD:30%-40% in prox. to mid segment with 20% in the mid segment and 20% in left Circ., mild 20% right common iliac narrowing. medical therapy  . HERNIA REPAIR  5 yrs ago   right   . NM MYOCAR PERF WALL MOTION  11/30/2007   protocol:Bruce, post EF 67%,mild perfusion defect seen in basal inferior consistant with Attenuation artifact, exercise cap. 12METS  . ROBOT ASSISTED LAPAROSCOPIC RADICAL PROSTATECTOMY  05/20/2012   Procedure: ROBOTIC ASSISTED LAPAROSCOPIC RADICAL PROSTATECTOMY LEVEL 1;  Surgeon: Dutch Gray, MD;  Location: [facility];  Service: Urology;  Laterality: N/A;     . TRANSURETHRAL RESECTION OF PROSTATE  87yrs ago       Home Medications    Prior to Admission medications   Medication Sig Start Date End Date Taking? Authorizing Provider  bisoprolol (ZEBETA) 10 MG tablet Take 10 mg by mouth daily.    [provider]  Multiple Vitamins-Minerals (MULTIVITAMIN PO) Take 1 tablet by mouth daily.    [provider]  niacin (NIASPAN) 1000 MG CR tablet Take 1 tablet by mouth daily. 12/04/15   [provider]  omeprazole (PRILOSEC) 20 MG capsule Take 20 mg by mouth 2 (two) times daily.    [provider]  [facility]  HFA 108 (90 BASE) MCG/ACT inhaler Take 2 puffs by mouth 2 (two) times daily as needed. For shortness of breath. 02/13/12   [provider]  rivaroxaban (XARELTO) 20 MG TABS tablet Take 1 tablet (20 mg total) by mouth daily with supper. 06/05/17   Sherran Needs, NP  simvastatin (ZOCOR) 40 MG tablet Take 1 tablet by mouth daily. 11/02/15   [provider]  Vitamin D, Ergocalciferol, 2000 units CAPS Take 1 capsule by mouth daily.    [provider]    Family History Family History  Problem Relation Age of Onset  . Stroke Mother   . Heart disease Father   . Heart attack Father   . Cancer Sister   . Stroke Maternal Grandfather   . Cancer Paternal Grandmother   .  Heart attack Paternal Grandfather   . Heart disease Paternal Grandfather   . Colon cancer Neg Hx   . Esophageal cancer Neg Hx   . Pancreatic cancer Neg Hx   . Rectal cancer Neg Hx   . Stomach cancer Neg Hx     Social History Social History   Tobacco Use  . Smoking status: Never Smoker  . Smokeless tobacco: Never Used  Substance Use Topics  . Alcohol use: Yes    Alcohol/week: 1.2 oz    Types: 2 Glasses of wine per week    Comment: occ. x 2 drinks weekly  . Drug use: No     Allergies   Gluten meal; Wheat bran; and Sulfa antibiotics   Review of Systems Review of Systems  Constitutional: Negative for diaphoresis.  Respiratory: Positive for shortness of breath.   Cardiovascular: Positive for chest pain.  Gastrointestinal: Negative for abdominal pain, nausea and vomiting.       Abdominal bloating  Musculoskeletal: Negative for back pain.  All other systems reviewed and are negative.    Physical Exam Updated Vital Signs BP 112/77   Pulse (!) 55   Temp 98.1 F (36.7 C) (Oral)   Resp 19   Ht 5\' 7"  (1.702 m)   Wt 78.7 kg (173 lb 8 oz)   SpO2 98%   BMI 27.17 kg/m   Physical Exam  Constitutional: He is oriented to person, place, and time. He appears well-developed and well-nourished.  Non-toxic appearance. He does not appear ill. No distress.  HENT:  Head: Normocephalic and atraumatic.  Right Ear: External ear normal.  Left Ear: External ear normal.  Nose: Nose normal.  Eyes: Right eye exhibits no discharge. Left eye exhibits no discharge.  Neck: Neck supple.  Cardiovascular: Normal rate, regular rhythm and normal heart sounds.  Pulses:      Radial pulses are 2+ on the right side, and 2+ on the left side.  Pulmonary/Chest: Effort normal and breath sounds normal. He exhibits no tenderness.  Abdominal: Soft. There is no tenderness.  Musculoskeletal: He exhibits no edema.  Neurological: He is alert and oriented to person, place, and time.  Skin: Skin is warm and  dry. He is not diaphoretic.  Nursing note and vitals reviewed.    ED Treatments / Results  Labs (all labs ordered are listed, but only abnormal results are displayed) Labs Reviewed  BASIC METABOLIC PANEL - Abnormal; Notable for the following components:      Result Value   Calcium 8.8 (*)    All other components within normal limits  CBC  TROPONIN I  TROPONIN I  TROPONIN I  TROPONIN I    EKG  EKG  Interpretation  Date/Time:  Tuesday June 09 2017 19:30:19 EST Ventricular Rate:  60 PR Interval:    QRS Duration: 104 QT Interval:  429 QTC Calculation: 429 R Axis:   15 Text Interpretation:  Sinus rhythm Baseline wander in lead(s) II III aVF no significant change since Jun 05 2017 Confirmed by Sherwood Gambler [phone]) on 06/09/2017 7:41:10 PM       EKG Interpretation  Date/Time:  Tuesday June 09 2017 20:24:28 EST Ventricular Rate:  60 PR Interval:    QRS Duration: 104 QT Interval:  431 QTC Calculation: 431 R Axis:   13 Text Interpretation:  Sinus rhythm Abnormal R-wave progression, early transition no significant change since earlier in the day Confirmed by Sherwood Gambler [phone]) on 06/09/2017 8:47:04 PM       Radiology Dg Chest 2 View  Result Date: 06/09/2017 CLINICAL DATA:  Chest pressure since 4 p.m. today. EXAM: CHEST  2 VIEW COMPARISON:  June 02, 2017 FINDINGS: The heart size and mediastinal contours are within normal limits. There is no focal infiltrate, pulmonary edema, or pleural effusion. There is chronic elevation of the right hemidiaphragm. The visualized skeletal structures are unremarkable. IMPRESSION: No active cardiopulmonary disease. Electronically Signed   By: Abelardo Diesel M.D.   On: 06/09/2017 20:22    Procedures Procedures (including critical care time)  Medications Ordered in ED Medications  nitroGLYCERIN (NITROSTAT) SL tablet 0.4 mg (0.4 mg Sublingual Given 06/09/17 1948)  acetaminophen (TYLENOL) tablet 650 mg (not administered)    morphine 2 MG/ML injection 2 mg (not administered)  aspirin chewable tablet 324 mg (324 mg Oral Given 06/09/17 2004)  nitroGLYCERIN (NITROGLYN) 2 % ointment 1 inch (1 inch Topical Given 06/09/17 2127)     Initial Impression / Assessment and Plan / ED Course  I have reviewed the triage vital signs and the nursing notes.  Pertinent labs & imaging results that were available during my care of the patient were reviewed by me and considered in my medical decision making (see chart for details).     Patient's pain is improved with nitro, now minimal.  Patient's HEART score is 4.  Given this, patient will need admission for ACS rule out.  Discussed with Dr. Blaine Hamper of tried hospitalist who accepts in admission and transfer to [facility].  Final Clinical Impressions(s) / ED Diagnoses   Final diagnoses:  Chest pain, unspecified type    ED Discharge Orders    None       Sherwood Gambler, MD 06/09/17 2352

## 2017-06-09 NOTE — Care Management (Signed)
This is a no charge note  Transfer from [facility] per Dr. Regenia Skeeter  67 year old male with past medical history of hyperlipidemia, GERD, prostate cancer (radical prostatectomy), anemia, hepatitis, diverticulosis, celiac disease, PAF on Xarelto, CAD, who presents with chest pain.  Patient has been having intermittent chest pain in substernal area for 3 days, which has worsened since 4:00 p.m. today. Per report, patient had cardioversion for newly onset A fib last week, and was started with Xarelto. Patient was found to have negative troponin, WBC 6.8, electrolytes renal function okay, bradycardia, oxygen saturation 98%, temperature normal, negative chest x-ray. EKG showed early R-wave progression, QTC 437, no significant ischemic change. Patient is placed on telemetry bed for observation.   Please call manager of [facility] at [phone] when pt arrives to floor   Ivor Costa, MD  [facility] Pager [phone]  If 7PM-7AM, please contact night-coverage [email or web address] Password [facility] 06/09/2017, 10:17 PM

## 2017-06-09 NOTE — ED Triage Notes (Signed)
C/o CP x 3 days-pt states he was cardioverted here last week-pt taken to tx area via w/c

## 2017-06-10 ENCOUNTER — Telehealth: Payer: Self-pay | Admitting: Cardiovascular Disease

## 2017-06-10 ENCOUNTER — Encounter (HOSPITAL_COMMUNITY): Payer: Self-pay | Admitting: *Deleted

## 2017-06-10 ENCOUNTER — Observation Stay (HOSPITAL_BASED_OUTPATIENT_CLINIC_OR_DEPARTMENT_OTHER): Payer: Medicare Other

## 2017-06-10 DIAGNOSIS — I480 Paroxysmal atrial fibrillation: Secondary | ICD-10-CM

## 2017-06-10 DIAGNOSIS — Z8546 Personal history of malignant neoplasm of prostate: Secondary | ICD-10-CM | POA: Diagnosis not present

## 2017-06-10 DIAGNOSIS — I25118 Atherosclerotic heart disease of native coronary artery with other forms of angina pectoris: Secondary | ICD-10-CM | POA: Diagnosis not present

## 2017-06-10 DIAGNOSIS — R079 Chest pain, unspecified: Secondary | ICD-10-CM | POA: Diagnosis present

## 2017-06-10 DIAGNOSIS — R7303 Prediabetes: Secondary | ICD-10-CM | POA: Diagnosis present

## 2017-06-10 DIAGNOSIS — K219 Gastro-esophageal reflux disease without esophagitis: Secondary | ICD-10-CM | POA: Diagnosis present

## 2017-06-10 DIAGNOSIS — I10 Essential (primary) hypertension: Secondary | ICD-10-CM | POA: Diagnosis present

## 2017-06-10 DIAGNOSIS — Z79899 Other long term (current) drug therapy: Secondary | ICD-10-CM | POA: Diagnosis not present

## 2017-06-10 DIAGNOSIS — K5790 Diverticulosis of intestine, part unspecified, without perforation or abscess without bleeding: Secondary | ICD-10-CM | POA: Diagnosis present

## 2017-06-10 DIAGNOSIS — D649 Anemia, unspecified: Secondary | ICD-10-CM | POA: Diagnosis present

## 2017-06-10 DIAGNOSIS — I351 Nonrheumatic aortic (valve) insufficiency: Secondary | ICD-10-CM | POA: Diagnosis not present

## 2017-06-10 DIAGNOSIS — Z882 Allergy status to sulfonamides status: Secondary | ICD-10-CM | POA: Diagnosis not present

## 2017-06-10 DIAGNOSIS — I25110 Atherosclerotic heart disease of native coronary artery with unstable angina pectoris: Secondary | ICD-10-CM | POA: Diagnosis present

## 2017-06-10 DIAGNOSIS — E786 Lipoprotein deficiency: Secondary | ICD-10-CM | POA: Diagnosis not present

## 2017-06-10 DIAGNOSIS — Z91018 Allergy to other foods: Secondary | ICD-10-CM | POA: Diagnosis not present

## 2017-06-10 DIAGNOSIS — Z7901 Long term (current) use of anticoagulants: Secondary | ICD-10-CM | POA: Diagnosis not present

## 2017-06-10 DIAGNOSIS — E782 Mixed hyperlipidemia: Secondary | ICD-10-CM | POA: Diagnosis present

## 2017-06-10 DIAGNOSIS — Z8249 Family history of ischemic heart disease and other diseases of the circulatory system: Secondary | ICD-10-CM | POA: Diagnosis not present

## 2017-06-10 DIAGNOSIS — K900 Celiac disease: Secondary | ICD-10-CM | POA: Diagnosis present

## 2017-06-10 DIAGNOSIS — I200 Unstable angina: Secondary | ICD-10-CM | POA: Diagnosis not present

## 2017-06-10 DIAGNOSIS — Z9079 Acquired absence of other genital organ(s): Secondary | ICD-10-CM | POA: Diagnosis not present

## 2017-06-10 LAB — LIPID PANEL
CHOLESTEROL: 122 mg/dL (ref 0–200)
HDL: 41 mg/dL (ref >40)
LDL CALC: 60 mg/dL (ref 0–99)
Total CHOL/HDL Ratio: 3 RATIO
Triglycerides: 103 mg/dL (ref <150)
VLDL: 21 mg/dL (ref 0–40)

## 2017-06-10 LAB — MRSA PCR SCREENING: MRSA by PCR: NEGATIVE

## 2017-06-10 LAB — TROPONIN I
Troponin I: <0.03 ng/mL (ref <0.03)
Troponin I: <0.03 ng/mL (ref <0.03)

## 2017-06-10 LAB — ECHOCARDIOGRAM COMPLETE
HEIGHTINCHES: 67 in
WEIGHTICAEL: 2721.6 [oz_av]

## 2017-06-10 LAB — HEPARIN LEVEL (UNFRACTIONATED): HEPARIN UNFRACTIONATED: 0.99 [IU]/mL — AB (ref 0.30–0.70)

## 2017-06-10 LAB — HIV ANTIBODY (ROUTINE TESTING W REFLEX): HIV Screen 4th Generation wRfx: NONREACTIVE

## 2017-06-10 LAB — HEMOGLOBIN A1C
Hgb A1c MFr Bld: 6.1 % — ABNORMAL HIGH (ref 4.8–5.6)
MEAN PLASMA GLUCOSE: 128.37 mg/dL

## 2017-06-10 LAB — APTT: APTT: 32 s (ref 24–36)

## 2017-06-10 MED ORDER — ADULT MULTIVITAMIN W/MINERALS CH
1.0000 | ORAL_TABLET | Freq: Every day | ORAL | Status: DC
  Administered 2017-06-10 – 2017-06-12 (×2): 1 via ORAL
  Filled 2017-06-10 (×2): qty 1

## 2017-06-10 MED ORDER — RIVAROXABAN 20 MG PO TABS: 20.0000 mg | ORAL_TABLET | Freq: Every day | ORAL | Status: DC

## 2017-06-10 MED ORDER — SODIUM CHLORIDE 0.9 % IV SOLN
250.0000 mL | INTRAVENOUS | Status: DC | PRN
  Administered 2017-06-10: 250 mL via INTRAVENOUS

## 2017-06-10 MED ORDER — ALPRAZOLAM 0.25 MG PO TABS: 0.2500 mg | ORAL_TABLET | Freq: Two times a day (BID) | ORAL | Status: DC | PRN

## 2017-06-10 MED ORDER — SIMVASTATIN 40 MG PO TABS
40.0000 mg | ORAL_TABLET | Freq: Every day | ORAL | Status: DC
  Administered 2017-06-10: 40 mg via ORAL
  Filled 2017-06-10: qty 1

## 2017-06-10 MED ORDER — NITROGLYCERIN IN D5W 200-5 MCG/ML-% IV SOLN
0.0000 ug/min | INTRAVENOUS | Status: DC
  Administered 2017-06-11: 5 ug/min via INTRAVENOUS
  Filled 2017-06-10: qty 250

## 2017-06-10 MED ORDER — HEPARIN (PORCINE) IN NACL 100-0.45 UNIT/ML-% IJ SOLN
1000.0000 [IU]/h | INTRAMUSCULAR | Status: DC
  Administered 2017-06-10: 1000 [IU]/h via INTRAVENOUS
  Filled 2017-06-10: qty 250

## 2017-06-10 MED ORDER — ASPIRIN 81 MG PO CHEW
81.0000 mg | CHEWABLE_TABLET | Freq: Every day | ORAL | Status: DC
  Administered 2017-06-10: 81 mg via ORAL
  Filled 2017-06-10: qty 1

## 2017-06-10 MED ORDER — SODIUM CHLORIDE 0.9% FLUSH
3.0000 mL | Freq: Two times a day (BID) | INTRAVENOUS | Status: DC
  Administered 2017-06-10: 3 mL via INTRAVENOUS

## 2017-06-10 MED ORDER — BISOPROLOL FUMARATE 5 MG PO TABS
5.0000 mg | ORAL_TABLET | Freq: Every day | ORAL | Status: DC
  Administered 2017-06-12: 10:00:00 5 mg via ORAL
  Filled 2017-06-10: qty 1

## 2017-06-10 MED ORDER — PANTOPRAZOLE SODIUM 40 MG PO TBEC
40.0000 mg | DELAYED_RELEASE_TABLET | Freq: Every day | ORAL | Status: DC
  Administered 2017-06-10 – 2017-06-12 (×2): 40 mg via ORAL
  Filled 2017-06-10 (×3): qty 1

## 2017-06-10 MED ORDER — BISOPROLOL FUMARATE 5 MG PO TABS
10.0000 mg | ORAL_TABLET | Freq: Every day | ORAL | Status: DC
  Filled 2017-06-10: qty 2

## 2017-06-10 MED ORDER — NIACIN ER (ANTIHYPERLIPIDEMIC) 500 MG PO TBCR
1000.0000 mg | EXTENDED_RELEASE_TABLET | Freq: Every day | ORAL | Status: DC
  Administered 2017-06-10 – 2017-06-12 (×2): 1000 mg via ORAL
  Filled 2017-06-10 (×4): qty 2

## 2017-06-10 MED ORDER — ONDANSETRON HCL 4 MG/2ML IJ SOLN: 4.0000 mg | Freq: Four times a day (QID) | INTRAMUSCULAR | Status: DC | PRN

## 2017-06-10 MED ORDER — SODIUM CHLORIDE 0.9 % WEIGHT BASED INFUSION: 3.0000 mL/kg/h | INTRAVENOUS | Status: DC

## 2017-06-10 MED ORDER — SODIUM CHLORIDE 0.9 % WEIGHT BASED INFUSION: 1.0000 mL/kg/h | INTRAVENOUS | Status: DC

## 2017-06-10 MED ORDER — VITAMIN D 1000 UNITS PO TABS
2000.0000 [IU] | ORAL_TABLET | Freq: Every day | ORAL | Status: DC
  Administered 2017-06-10 – 2017-06-12 (×2): 2000 [IU] via ORAL
  Filled 2017-06-10 (×2): qty 2

## 2017-06-10 MED ORDER — ZOLPIDEM TARTRATE 5 MG PO TABS: 5.0000 mg | ORAL_TABLET | Freq: Every evening | ORAL | Status: DC | PRN

## 2017-06-10 MED ORDER — SODIUM CHLORIDE 0.9% FLUSH: 3.0000 mL | INTRAVENOUS | Status: DC | PRN

## 2017-06-10 MED ORDER — POTASSIUM CHLORIDE CRYS ER 20 MEQ PO TBCR
20.0000 meq | EXTENDED_RELEASE_TABLET | Freq: Once | ORAL | Status: AC
  Administered 2017-06-10: 20 meq via ORAL
  Filled 2017-06-10: qty 1

## 2017-06-10 MED ORDER — ASPIRIN 81 MG PO CHEW
81.0000 mg | CHEWABLE_TABLET | ORAL | Status: AC
  Administered 2017-06-11: 81 mg via ORAL
  Filled 2017-06-10: qty 1

## 2017-06-10 MED ORDER — MOMETASONE FURO-FORMOTEROL FUM 200-5 MCG/ACT IN AERO
2.0000 | INHALATION_SPRAY | Freq: Two times a day (BID) | RESPIRATORY_TRACT | Status: DC
  Filled 2017-06-10 (×2): qty 8.8

## 2017-06-10 MED ORDER — ACETAMINOPHEN 325 MG PO TABS: 650.0000 mg | ORAL_TABLET | ORAL | Status: DC | PRN

## 2017-06-10 MED ORDER — ALBUTEROL SULFATE (2.5 MG/3ML) 0.083% IN NEBU: 3.0000 mL | INHALATION_SOLUTION | RESPIRATORY_TRACT | Status: DC | PRN

## 2017-06-10 NOTE — Progress Notes (Signed)
Pt with episode of 1/10 central chest "twinging" again. NT in room performing EKG, which shows NSR with PVCs. 2L [place] applied. Pt with Nitro patch. Pt reports pain subsided, but that it has been a precursor to his episodes of severe chest pressure. Will continue to monitor.

## 2017-06-10 NOTE — Progress Notes (Signed)
Pt c/o chest pain 4/10 BP 133/57 HR 49 NSB Pt placed on 02 2L EKG obtained MD made aware. Also await for cards consult.

## 2017-06-10 NOTE — Progress Notes (Signed)
Pt resting wife at the bedside.

## 2017-06-10 NOTE — Progress Notes (Addendum)
After 3rd Nitro pt c/o 2/10 chest pain. MD paged. Trish cardmaster also paged. RRT called Morphine IV given.

## 2017-06-10 NOTE — Care Management Obs Status (Signed)
[place] NOTIFICATION   Patient Details  Name: Jose Warner MRN: [id number] Date of Birth: 09-22-1950   Medicare Observation Status Notification Given:  Yes    Carles Collet, RN 06/10/2017, 10:37 AM

## 2017-06-10 NOTE — H&P (Signed)
History and Physical    Jose Warner GMW:[id number] DOB: December 14, 1950 DOA: 06/09/2017  Referring MD/NP/PA:   PCP: Derinda Late, MD   Patient coming from:  The patient is coming from home.  At baseline, pt is independent for most of ADL.   Chief Complaint: Chest pain  HPI: Jose Warner is a 67 y.o. male with medical history significant of GERD, prostate cancer (s/p of radical prostatectomy), anemia, hepatitis, diverticulosis, celiac disease, PAF on Xarelto, CAD, who presents with chest pain.  Patient has been having intermittent chest pain in substernal area for 3 days, which has worsened since 4:00 p.m. His chest is located in the substernal area, 7 out of 10 in severity initially, currently 0 out of 10, intermittent, pressure-like pain, nonradiating. It is associated with mild SOB, which has resolved currently. Patient does not have cough, fever or chills. No tenderness in the calvarium. Patient does not have nausea, vomiting, diarrhea, abdominal pain, symptoms of UTI or unilateral weakness. Ppatient had cardioversion for newly onset A fib last week, and was started with Xarelto.  ED Course: pt was found to have have negative troponin, WBC 6.8, electrolytes renal function okay, bradycardia, oxygen saturation 98%, temperature normal, negative chest x-ray. EKG showed early R-wave progression, QTC 437, no significant ischemic change. Patient is placed on telemetry bed for observation.  Review of Systems:   General: no fevers, chills, no body weight gain, has fatigue HEENT: no blurry vision, hearing changes or sore throat Respiratory: has dyspnea, no coughing, wheezing CV: has chest pain, no palpitations GI: no nausea, vomiting, abdominal pain, diarrhea, constipation GU: no dysuria, burning on urination, increased urinary frequency, hematuria  Ext: no leg edema Neuro: no unilateral weakness, numbness, or tingling, no vision change or hearing loss Skin: no rash, no skin tear. MSK:  No muscle spasm, no deformity, no limitation of range of movement in spin Heme: No easy bruising.  Travel history: No recent long distant travel.  Allergy:  Allergies  Allergen Reactions  . Gluten Meal   . Wheat Bran   . Sulfa Antibiotics Rash    Past Medical History:  Diagnosis Date  . Anemia   . Bronchitis 05-10-12   past fall- 4 runs antibiotics due to bronchitis-uses [employer] as needed  . Celiac disease   . Colon polyps    adenomatous  . Diverticulosis   . GERD (gastroesophageal reflux disease)   . Hepatitis 05-10-12   "was told non A, non B"-unclean dental equip.  Marland Kitchen Hyperlipidemia   . Iron deficiency anemia   . Prostate cancer ([place]) 05-10-12   ;bx. 04-12-12, dx  . Spasm of esophagus 2013    Past Surgical History:  Procedure Laterality Date  . CARDIAC CATHETERIZATION  09/13/2004   LAD:30%-40% in prox. to mid segment with 20% in the mid segment and 20% in left Circ., mild 20% right common iliac narrowing. medical therapy  . HERNIA REPAIR  5 yrs ago   right   . NM MYOCAR PERF WALL MOTION  11/30/2007   protocol:Bruce, post EF 67%,mild perfusion defect seen in basal inferior consistant with Attenuation artifact, exercise cap. 12METS  . ROBOT ASSISTED LAPAROSCOPIC RADICAL PROSTATECTOMY  05/20/2012   Procedure: ROBOTIC ASSISTED LAPAROSCOPIC RADICAL PROSTATECTOMY LEVEL 1;  Surgeon: Dutch Gray, MD;  Location: [facility];  Service: Urology;  Laterality: N/A;     . TRANSURETHRAL RESECTION OF PROSTATE  75yr ago    Social History:  reports that  has never smoked. he has never used smokeless tobacco.  He reports that he drinks about 1.2 oz of alcohol per week. He reports that he does not use drugs.  Family History:  Family History  Problem Relation Age of Onset  . Stroke Mother   . Heart disease Father   . Heart attack Father   . Cancer Sister   . Stroke Maternal Grandfather   . Cancer Paternal Grandmother   . Heart attack Paternal Grandfather   . Heart disease Paternal  Grandfather   . Colon cancer Neg Hx   . Esophageal cancer Neg Hx   . Pancreatic cancer Neg Hx   . Rectal cancer Neg Hx   . Stomach cancer Neg Hx      Prior to Admission medications   Medication Sig Start Date End Date Taking? Authorizing Provider  bisoprolol (ZEBETA) 10 MG tablet Take 10 mg by mouth daily.    [provider]  Multiple Vitamins-Minerals (MULTIVITAMIN PO) Take 1 tablet by mouth daily.    [provider]  niacin (NIASPAN) 1000 MG CR tablet Take 1 tablet by mouth daily. 12/04/15   [provider]  omeprazole (PRILOSEC) 20 MG capsule Take 20 mg by mouth 2 (two) times daily.    [provider]  PROAIR HFA 108 (90 BASE) MCG/ACT inhaler Take 2 puffs by mouth 2 (two) times daily as needed. For shortness of breath. 02/13/12   [provider]  rivaroxaban (XARELTO) 20 MG TABS tablet Take 1 tablet (20 mg total) by mouth daily with supper. 06/05/17   Sherran Needs, NP  simvastatin (ZOCOR) 40 MG tablet Take 1 tablet by mouth daily. 11/02/15   [provider]  Vitamin D, Ergocalciferol, 2000 units CAPS Take 1 capsule by mouth daily.    [provider]    Physical Exam: Vitals:   06/09/17 2100 06/09/17 2130 06/09/17 2200 06/10/17 0005  BP: (!) 102/57 107/61 112/77 (!) 127/56  Pulse: (!) 57 (!) 56 (!) 55 (!) 50  Resp: 18 15 19 18   Temp:    97.9 F (36.6 C)  TempSrc:    Oral  SpO2: 98% 98% 98% 98%  Weight:    77.2 kg (170 lb 1.6 oz)  Height:    5' 7"  (1.702 m)   General: Not in acute distress HEENT:       Eyes: PERRL, EOMI, no scleral icterus.       ENT: No discharge from the ears and nose, no pharynx injection, no tonsillar enlargement.        Neck: No JVD, no bruit, no mass felt. Heme: No neck lymph node enlargement. Cardiac: S1/S2, RRR, No murmurs, No gallops or rubs. Respiratory: No rales, wheezing, rhonchi or rubs. GI: Soft, nondistended, nontender, no rebound pain, no organomegaly, BS present. GU: No  hematuria Ext: No pitting leg edema bilaterally. 2+DP/PT pulse bilaterally. Musculoskeletal: No joint deformities, No joint redness or warmth, no limitation of ROM in spin. Skin: No rashes.  Neuro: Alert, oriented X3, cranial nerves II-XII grossly intact, moves all extremities normally.  Psych: Patient is not psychotic, no suicidal or hemocidal ideation.  Labs on Admission: I have personally reviewed following labs and imaging studies  CBC: Recent Labs  Lab 06/09/17 1926  WBC 6.8  HGB 14.6  HCT 44.2  MCV 87.9  PLT 188   Basic Metabolic Panel: Recent Labs  Lab 06/09/17 1926  NA 136  K 3.5  CL 102  CO2 23  GLUCOSE 80  BUN 12  CREATININE 1.01  CALCIUM 8.8*   GFR:  Estimated Creatinine Clearance: 68.2 mL/min (by C-G formula based on SCr of 1.01 mg/dL). Liver Function Tests: No results for input(s): AST, ALT, ALKPHOS, BILITOT, PROT, ALBUMIN in the last 168 hours. No results for input(s): LIPASE, AMYLASE in the last 168 hours. No results for input(s): AMMONIA in the last 168 hours. Coagulation Profile: No results for input(s): INR, PROTIME in the last 168 hours. Cardiac Enzymes: Recent Labs  Lab 06/09/17 1926  TROPONINI <0.03   BNP (last 3 results) No results for input(s): PROBNP in the last 8760 hours. HbA1C: Recent Labs    06/10/17 0108  HGBA1C 6.1*   CBG: No results for input(s): GLUCAP in the last 168 hours. Lipid Profile: Recent Labs    06/10/17 0108  CHOL 122  HDL 41  LDLCALC 60  TRIG 103  CHOLHDL 3.0   Thyroid Function Tests: No results for input(s): TSH, T4TOTAL, FREET4, T3FREE, THYROIDAB in the last 72 hours. Anemia Panel: No results for input(s): VITAMINB12, FOLATE, FERRITIN, TIBC, IRON, RETICCTPCT in the last 72 hours. Urine analysis: No results found for: COLORURINE, APPEARANCEUR, LABSPEC, PHURINE, GLUCOSEU, HGBUR, BILIRUBINUR, KETONESUR, PROTEINUR, UROBILINOGEN, NITRITE, LEUKOCYTESUR Sepsis  Labs: @LABRCNTIP (procalcitonin:4,lacticidven:4) )No results found for this or any previous visit (from the past 240 hour(s)).   Radiological Exams on Admission: Dg Chest 2 View  Result Date: 06/09/2017 CLINICAL DATA:  Chest pressure since 4 p.m. today. EXAM: CHEST  2 VIEW COMPARISON:  June 02, 2017 FINDINGS: The heart size and mediastinal contours are within normal limits. There is no focal infiltrate, pulmonary edema, or pleural effusion. There is chronic elevation of the right hemidiaphragm. The visualized skeletal structures are unremarkable. IMPRESSION: No active cardiopulmonary disease. Electronically Signed   By: Abelardo Diesel M.D.   On: 06/09/2017 20:22     EKG: Independently reviewed.  Sinus rhythm, early R-wave progression, QTC 437  Assessment/Plan Principal Problem:   Chest pain Active Problems:   Low HDL (under 40)   Paroxysmal atrial fibrillation (HCC)   CAD (coronary artery disease)   GERD (gastroesophageal reflux disease)   Chest pain and hx of CAD: Patient is on Xarelto, less likely to have PE. Clinically no pneumonia. Initial troponin negative. - will place on Tele bed for obs - cycle CE q6 x3 and repeat EKG in the am  - prn Nitroglycerin, Morphine, and aspirin, zocor  - Risk factor stratification: will check FLP and A1C  - 2d echo  - Inpatient non-urgent card consult order was put in Epic and message to Birdie Sons was sent out.  Low HDL (under 40): -continue zocor  Atrial Fibrillation: CHA2DS2-VASc Score is 2, needs oral anticoagulation. Patient is on Xarelto at home. Heart rate is well controlled. Pt is followed with Dr. Claiborne Billings -Continue Zebeta and Xarelto  GERD: -Protonix  DVT ppx: on Xarelto Code Status: Full code Family Communication:  Yes, patient's wife at bed side Disposition Plan:  Anticipate discharge back to previous home environment Consults called:  none Admission status: Obs / tele     Date of Service 06/10/2017    Ivor Costa [facility] Pager [phone]  If 7PM-7AM, please contact night-coverage [email or web address] Password [facility] 06/10/2017, 3:38 AM

## 2017-06-10 NOTE — Progress Notes (Signed)
ANTICOAGULATION CONSULT NOTE - Initial Consult  Pharmacy Consult for heparin while rivaroxaban on hold  Indication: chest pain/ACS / Afib  Allergies  Allergen Reactions  . Gluten Meal Other (See Comments)    celiac  . Wheat Bran Other (See Comments)    Celiac   . Sulfa Antibiotics Rash    Patient Measurements: Height: 5\' 7"  (170.2 cm) Weight: 170 lb 1.6 oz (77.2 kg)(scale b) IBW/kg (Calculated) : 66.1   Vital Signs: Temp: 97.8 F (36.6 C) (02/20 1200) Temp Source: Oral (02/20 1200) BP: 104/59 (02/20 1200) Pulse Rate: 46 (02/20 1200)  Labs: Recent Labs    06/09/17 1926 06/10/17 0911  HGB 14.6  --   HCT 44.2  --   PLT 190  --   CREATININE 1.01  --   TROPONINI <0.03 <0.03    Estimated Creatinine Clearance: 68.2 mL/min (by C-G formula based on SCr of 1.01 mg/dL).   Medical History: Past Medical History:  Diagnosis Date  . Anemia   . Atrial fibrillation ([place]) 06/05/2017  . Bronchitis 05-10-12   past fall- 4 runs antibiotics due to bronchitis-uses [employer] as needed  . Celiac disease   . Colon polyps    adenomatous  . Diverticulosis   . GERD (gastroesophageal reflux disease)   . Hepatitis 05-10-12   "was told non A, non B"-unclean dental equip.  Marland Kitchen Hyperlipidemia   . Iron deficiency anemia   . Prostate cancer ([place]) 05-10-12   ;bx. 04-12-12, dx  . Spasm of esophagus 2013      Assessment: 66yom admitted for CP s/p new Afib> DCCV last week.  Trop neg but ongoing CP plan to cath tomorrow.  Hold rivaroxaban and bridge with heparin as to prevent being uncovered with [facility] post DCCV.  Last dose rivaroxaban 2/19 pm, start heaprin tonight, cbc stable Cr 1.    Goal of Therapy:  Heparin level 0.3-0.7 units/ml aPTT 66-102 seconds Monitor platelets by anticoagulation protocol: Yes   Plan:  Hold rivaroxaban last dose 2/19> follow up restart post cath Draw BL heparin level and aptt Start heparin drip tonight 1000 uts/hr Daily HL, aptt  Bonnita Nasuti Pharm.D. CPP,  BCPS Clinical Pharmacist [phone] 06/10/2017 1:20 PM

## 2017-06-10 NOTE — Progress Notes (Signed)
Pt arrived to 2C08 from 3 [place] for nitro gtt. When pt arrives to the room he states he is having no chest pain at this time. Will hold off on starting nitro gtt due to pt in no chest pain at the moment and BP 101/54

## 2017-06-10 NOTE — Consult Note (Addendum)
Cardiology Consultation:   Patient ID: Jose Warner; [id number]; 1950/11/16   Admit date: 06/09/2017 Date of Consult: 06/10/2017  Primary Care Provider: Derinda Late, MD Primary Cardiologist: Shelva Majestic, MD  Primary Electrophysiologist:  [facility]   Patient Profile:   Jose Warner is a 67 y.o. male with a hx of PAF, HLD, Celiac disease, GERD and FH premature CAD, with non-obstructive disease on cath 2006 who is being seen today for the evaluation of chest pain at the request of  Dr. Roger Shelter.  History of Present Illness:   Jose Warner has a hx of PAF, HLD, HTN, Celiac disease, and FH premature CAD, with non-obstructive disease on cath 2006  With mLAD 20-30% stenosis.  Also hx of prostate cancer and robotic prostatectomy.   Last nuc 2009 was low risk.  Echo 07/30/12 with EF 55-65%; mild to mod AR.  Jose Warner had been maintaining SR until 06/02/17 and had DCCV and placed on Xarelto.   His HR on 2/15 was 48 with usual HR in 50s.  Prior to this episode of a fib pt had been on decongestants for cold and his bystolic had been switched to bisoprolol.    CHA2DS2VASc of 2 on Xarelto at 20 mg daily.    Pt presented to ER on 06/09/17 with 3 day hx of chest pain.   Describes lower to mid sternal pressure.  Started Sunday and will come and go.  Jose Warner felt ok Tuesday until 4 pm then was more intense and associated with SOB    NTG X2 with relief.     Last episode this AM at 0713 and 9.     Troponins <0.03 X 2 EKG I personally reviewed SB with no acute changes Telemetry:  Telemetry was personally reviewed and demonstrates:  SR to SB at 45 with occ PVCs.      K+ 3.5, Cr 1.01, Na 136 Hgb 14.6, Hct 44.2  WBC 6.8 Hgb A 1C is 6.1 LDL is 60, TG 103 HDL 41  CXR 2 V, no active disease.  VS stable.  Currently still aware of discomfort.  Last Xarelto was last pm.   Past Medical History:  Diagnosis Date  . Anemia   . Atrial fibrillation ([place]) 06/05/2017  . Bronchitis 05-10-12   past fall- 4 runs  antibiotics due to bronchitis-uses [employer] as needed  . Celiac disease   . Colon polyps    adenomatous  . Diverticulosis   . GERD (gastroesophageal reflux disease)   . Hepatitis 05-10-12   "was told non A, non B"-unclean dental equip.  Marland Kitchen Hyperlipidemia   . Iron deficiency anemia   . Prostate cancer ([place]) 05-10-12   ;bx. 04-12-12, dx  . Spasm of esophagus 2013    Past Surgical History:  Procedure Laterality Date  . CARDIAC CATHETERIZATION  09/13/2004   LAD:30%-40% in prox. to mid segment with 20% in the mid segment and 20% in left Circ., mild 20% right common iliac narrowing. medical therapy  . CARDIOVERSION  06/05/2017  . HERNIA REPAIR  5 yrs ago   right   . NM MYOCAR PERF WALL MOTION  11/30/2007   protocol:Bruce, post EF 67%,mild perfusion defect seen in basal inferior consistant with Attenuation artifact, exercise cap. 12METS  . ROBOT ASSISTED LAPAROSCOPIC RADICAL PROSTATECTOMY  05/20/2012   Procedure: ROBOTIC ASSISTED LAPAROSCOPIC RADICAL PROSTATECTOMY LEVEL 1;  Surgeon: Dutch Gray, MD;  Location: [facility];  Service: Urology;  Laterality: N/A;     . TRANSURETHRAL RESECTION OF PROSTATE  67yr ago     Home Medications:  Prior to Admission medications   Medication Sig Start Date End Date Taking? Authorizing Provider  bisoprolol (ZEBETA) 10 MG tablet Take 10 mg by mouth daily.   Yes [provider]  budesonide-formoterol (SYMBICORT) 160-4.5 MCG/ACT inhaler Inhale 2 puffs into the lungs 2 (two) times daily.   Yes [provider]  guaiFENesin (MUCINEX) 600 MG 12 hr tablet Take 600 mg by mouth 2 (two) times daily as needed.   Yes [provider]  Multiple Vitamins-Minerals (MULTIVITAMIN PO) Take 1 tablet by mouth daily.   Yes [provider]  niacin (NIASPAN) 1000 MG CR tablet Take 1 tablet by mouth at bedtime.  12/04/15  Yes [provider]  omeprazole (PRILOSEC) 20 MG capsule Take 20 mg by mouth at bedtime.    Yes [provider]    PROAIR HFA 108 (90 BASE) MCG/ACT inhaler Take 2 puffs by mouth 2 (two) times daily as needed. For shortness of breath. 02/13/12  Yes [provider]  rivaroxaban (XARELTO) 20 MG TABS tablet Take 1 tablet (20 mg total) by mouth daily with supper. 06/05/17  Yes CSherran Needs NP  simvastatin (ZOCOR) 40 MG tablet Take 1 tablet by mouth daily. 11/02/15  Yes [provider]  Vitamin D, Ergocalciferol, 2000 units CAPS Take 1 capsule by mouth daily.   Yes [provider]  aspirin 81 MG chewable tablet Chew 81 mg by mouth daily.    [provider]    Inpatient Medications: Scheduled Meds: . acetaminophen  650 mg Oral Once  . aspirin  81 mg Oral Daily  . bisoprolol  10 mg Oral Daily  . cholecalciferol  2,000 Units Oral Daily  . multivitamin with minerals  1 tablet Oral Daily  . niacin  1,000 mg Oral Daily  . pantoprazole  40 mg Oral Daily  . rivaroxaban  20 mg Oral Q supper  . simvastatin  40 mg Oral Daily   Continuous Infusions:  PRN Meds: acetaminophen, albuterol, ALPRAZolam, morphine injection, nitroGLYCERIN, ondansetron (ZOFRAN) IV, zolpidem  Allergies:    Allergies  Allergen Reactions  . Gluten Meal Other (See Comments)    celiac  . Wheat Bran Other (See Comments)    Celiac   . Sulfa Antibiotics Rash    Social History:   Social History   Socioeconomic History  . Marital status: Married    Spouse name: Not on file  . Number of children: 3  . Years of education: Not on file  . Highest education level: Not on file  Social Needs  . Financial resource strain: Not on file  . Food insecurity - worry: Patient refused  . Food insecurity - inability: Patient refused  . Transportation needs - medical: Patient refused  . Transportation needs - non-medical: Patient refused  Occupational History    Employer: [employer]  Tobacco Use  . Smoking status: Never Smoker  . Smokeless tobacco: Never Used  Substance and Sexual Activity  . Alcohol use:  Yes    Alcohol/week: 1.2 oz    Types: 2 Glasses of wine per week    Comment: occ. x 2 drinks weekly  . Drug use: No  . Sexual activity: Yes    Partners: Female  Other Topics Concern  . Not on file  Social History Narrative  . Not on file    Family History:    Family History  Problem Relation Age of Onset  . Stroke Mother   . Heart  disease Father   . Heart attack Father   . Cancer Sister   . Stroke Maternal Grandfather   . Cancer Paternal Grandmother   . Heart attack Paternal Grandfather   . Heart disease Paternal Grandfather   . Colon cancer Neg Hx   . Esophageal cancer Neg Hx   . Pancreatic cancer Neg Hx   . Rectal cancer Neg Hx   . Stomach cancer Neg Hx      ROS:  Please see the history of present illness.  General:no colds or fevers, no weight changes Skin:no rashes or ulcers HEENT:no blurred vision, no congestion CV:see HPI PUL:see HPI GI:no diarrhea constipation or melena, no indigestion GU:no hematuria, no dysuria MS:no joint pain, no claudication Neuro:no syncope, some  lightheadedness Endo:no diabetes to borderline with hgb A1c of 6.1, no thyroid disease  All other ROS reviewed and negative.     Physical Exam/Data:   Vitals:   06/10/17 0947 06/10/17 0958 06/10/17 0959 06/10/17 1014  BP: 101/61 (!) 106/56 (!) 114/59 (!) 127/58  Pulse: (!) 49  (!) 49 (!) 49  Resp:      Temp:      TempSrc:      SpO2:      Weight:      Height:        Intake/Output Summary (Last 24 hours) at 06/10/2017 1057 Last data filed at 06/10/2017 0105 Gross per 24 hour  Intake 0 ml  Output 0 ml  Net 0 ml   Filed Weights   06/09/17 1921 06/10/17 0005  Weight: 173 lb 8 oz (78.7 kg) 170 lb 1.6 oz (77.2 kg)   Body mass index is 26.64 kg/m.  General:  Well nourished, well developed, in no acute distress though earlier Jose Warner did have increased chest pain. HEENT: normal Lymph: no adenopathy Neck: no JVD Endocrine:  No thryomegaly Vascular: No carotid bruits; 2+ pedal pulses  bilaterally without bruits  Cardiac:  normal S1, S2; RRR; no murmur gallup rub or click Lungs:  clear to auscultation bilaterally, no wheezing, rhonchi occ rales  Abd: soft, nontender, no hepatomegaly  Ext: no edema Musculoskeletal:  No deformities, BUE and BLE strength normal and equal Skin: warm and dry  Neuro:  Alert and oriented X 3 MAE follows commands, no focal abnormalities noted Psych:  Normal affect    Relevant CV Studies: Echo 07/30/12  Study Conclusions  - Left ventricle: The cavity size was normal. There was hypertrophy, with an appearance suggesting concentric remodeling (increased wall thickness with normal wall mass). Systolic function was normal. The estimated ejection fraction was in the range of 55% to 65%. Wall motion was normal; there were no regional wall motion abnormalities. Left ventricular diastolic function parameters were normal for the patient's age. - Aortic valve: Mild to moderate regurgitation. - Mitral valve: Mildly calcified annulus. Impressions:  - Normal pulmonary artery pressure.   Laboratory Data:  Chemistry Recent Labs  Lab 06/09/17 1926  NA 136  K 3.5  CL 102  CO2 23  GLUCOSE 80  BUN 12  CREATININE 1.01  CALCIUM 8.8*  GFRNONAA >60  GFRAA >60  ANIONGAP 11    No results for input(s): PROT, ALBUMIN, AST, ALT, ALKPHOS, BILITOT in the last 168 hours. Hematology Recent Labs  Lab 06/09/17 1926  WBC 6.8  RBC 5.03  HGB 14.6  HCT 44.2  MCV 87.9  MCH 29.0  MCHC 33.0  RDW 14.0  PLT 190   Cardiac Enzymes Recent Labs  Lab 06/09/17 1926 06/10/17 0911  TROPONINI <0.03 <0.03   No results for input(s): TROPIPOC in the last 168 hours.  BNPNo results for input(s): BNP, PROBNP in the last 168 hours.  DDimer No results for input(s): DDIMER in the last 168 hours.  Radiology/Studies:  Dg Chest 2 View  Result Date: 06/09/2017 CLINICAL DATA:  Chest pressure since 4 p.m. today. EXAM: CHEST  2 VIEW COMPARISON:   June 02, 2017 FINDINGS: The heart size and mediastinal contours are within normal limits. There is no focal infiltrate, pulmonary edema, or pleural effusion. There is chronic elevation of the right hemidiaphragm. The visualized skeletal structures are unremarkable. IMPRESSION: No active cardiopulmonary disease. Electronically Signed   By: Abelardo Diesel M.D.   On: 06/09/2017 20:22    Assessment and Plan:   1. Chest pain with known CAD in 2006 by cath though non obstructive at that time.  HLD, HTN.  + FH of CAD.  Also hx of GERD which this may be and hx of esophageal spasm.  Echo ordered.  ? Stress test vs. Cath with ongoing pain.  Relief with NTG but did require Morphine this AM.   Last dose Xarelto last pm If cath will hold.  May need IV NTG to prevent pain and PPI Jose Warner was on prilosec now protonix 40 mg daily.  Will add IV heparin this PM.  Plan for cath tomorrow afternoon with normal Cr clearance Xarelto should be out of system   Dr. Claiborne Billings discussed cath The patient understands that risks included but are not limited to stroke (1 in 1000), death (1 in 1000), kidney failure [usually temporary] (1 in 500), bleeding (1 in 200), allergic reaction [possibly serious] (1 in 200).   2.          PAF once with illness and most recently with decongestant and BB change.  Though ? Due to ischemia. -DCCV on 06/02/17 currently maintaining SR.  Needs anticoagulation for 4 weeks post DCCV and plan was to discuss with Dr. Claiborne Billings ongoing use.  3.           HTN controlled   4.           SBrady to 48 at times as outpt on bisoprolol 10 mg daily  5.            HLD on zocor 40 mg. With controlled lipids.     For questions or updates, please contact [facility] Please consult [email or web address] for contact info under Cardiology/STEMI.   Signed, Cecilie Kicks, NP  06/10/2017 10:57 AM   Patient seen and examined. Agree with assessment and plan. Jose Warner is well-known to me and I been caring for him for approximately  16 years.  Jose Warner is a history of hypertension, hyperlipidemia with very low HDL levels is followed by Dr. Sandi Mariscal for his primary care.  In 2006 Jose Warner underwent cardiac catheterization after nuclear study suggested possible inferior ischemia andwas having episodes of chest pain.  Catheterization revealed 20-30% mid LAD and LCX stenoses.  Jose Warner has history of prostate CA and underwent robotic prostatectomy by Dr. Araceli Bouche in 2014. When I saw him in follow-up in March 2014 Jose Warner was in atrial fibrillation and was asymptomatic and unaware of this rhythm abnormality.  At that time Jose Warner was started on eliquis in addition to metoprolol and ultimately converted pharmacologically to sinus rhythm.  Jose Warner had maintained sinus rhythm ever since. His eliquis was later discontinued.  I last saw him in November 2018.  Retrospectively, Jose Warner feels that over the past several  months Jose Warner has noticed these episodes of chest fluttering that were short-lived.  Jose Warner presented to the emergency room on 06/02/2017 and was in atrial fibrillation with a rate at 119.  At that time.  Jose Warner also noticed some mild lightheadedness and also had some mild chest tightness.  Jose Warner was converted to sinus rhythm in the emergency room.  Retrospectively a trigger may have been recent URI treated with decongestants a week before.  Was seen by Roderic Palau in A[facility] fib clinic several days later.  Jose Warner has been on Xarelto 20 mg daily, which was initiated at his ER evaluation with a cha2ds 2vasc score of 2.  Over the past several days Jose Warner has noticed episodes of chest tightness which Jose Warner describes as a deep pressure on his chest.  Yesterday the symptoms became more intense and were associated with shortness of breath.  Jose Warner presented to the hospital.  There was relief following 2 sublingual nitroglycerin.  Jose Warner again has had recurrent chest tightness this morning and Jose Warner did not have immediate relief following subpleural nitroglycerin.  Troponins have been negative 2.  His ECG has shown sinus  bradycardia with no acute ST-T changes.  Jose Warner has been demonstrated to have heart rates in the mid upper 40s with a rare PVC.  The patient's last dose of Xarelto was 6 PM last evening (June 09, 2017). His symptoms are worrisome for angina, which may also been contributory to his recent AF development, I have recommended definitive cardiac catheterization.  Since his last dose of eliquis was last evening, we will plan the catheterization to be done tomorrow afternoon, which would be at least 36 hours since his last dose.  In light of his bradycardia, will reduce bisoprolol to 5 mg from his current dose of 10 mg daily.The risks and benefits of a cardiac catheterization including, but not limited to, death, stroke, MI, kidney damage and bleeding were discussed with the patient who indicates understanding and agrees to proceed.    Troy Sine, MD, [facility] 06/10/2017 1:37 PM

## 2017-06-10 NOTE — Progress Notes (Signed)
Hospitalist admissions notified

## 2017-06-10 NOTE — Telephone Encounter (Signed)
Message routed to Gay Filler, cardmaster to follow up on cardiology consult

## 2017-06-10 NOTE — Progress Notes (Signed)
  Echocardiogram 2D Echocardiogram has been performed.  Jose Warner 06/10/2017, 2:36 PM

## 2017-06-10 NOTE — Progress Notes (Addendum)
Report given to Receiving RN. Family at the bedside aware of transfer. Nitro gtt not started due to BP .

## 2017-06-10 NOTE — Progress Notes (Signed)
Patient's CP improving states it is a 1/10 now.  Per RN, MD aware of event, no new orders.  RN to call if assistance needed.

## 2017-06-10 NOTE — Progress Notes (Signed)
Pt. C/o chest pain 4/10  Wife at the bedside. MD paged no return call back. 2nd nitro given.

## 2017-06-10 NOTE — Progress Notes (Signed)
PROGRESS NOTE    Patient: Jose Warner     PCP: Derinda Late, MD                    DOB: Sep 02, 1950            DOA: 06/09/2017 GUY:[id number]             DOS: 06/10/2017, 3:44 PM   LOS: 0 days   Date of Service: The patient was seen and examined on 06/10/2017  Subjective:   Patient was seen and examined this morning, still was complaining of some chest discomfort.  But he reported the morphine has helped. Thus far his troponin has been negative no acute changes in EKG was noted This morning he was still waiting for his cardiologist to see him. Wife are present at the bedside.  -----------------------------------------------------------------------------------------------------------------  Brief Narrative:   Jose Warner is a 67 y.o. male with medical history significant of GERD, prostate cancer (s/p of radical prostatectomy), anemia, hepatitis, diverticulosis, celiac disease, PAF onXarelto, CAD, who presents with chest pain.  This post recent cardioversion. Resenting with acute onset of chest pain, mild shortness of breath. Initial workup in the ED was negative with exception of bradycardia.  -----------------------------------------------------------------------------------------------------------------  Principal Problem:   Chest pain Active Problems:   Low HDL (under 40)   Paroxysmal atrial fibrillation (HCC)   CAD (coronary artery disease)   GERD (gastroesophageal reflux disease)   Assessment & Plan:    Chest pain and hx of CAD -angina Ruling out myocardial infarction, cardiac enzyme thus far has been negative No acute changes on EKG For bradycardia at the rate of 46 this morning Pending 2D echo cardiac Cardiology Dr. Claiborne Billings has been consulted PRN nitroglycerin, morphine, asa, Zocor We will continue to manage cannula, per cardiology recommendation were expecting his beta-blocker to be reduced -Holding Xarelto in anticipation of possible cardiac cath in  a.m.   Low HDL (under 40): -continue zocor  Atrial Fibrillation:  Status post recent cardioversion CHA2DS2-VASc Score is 2, needs oral anticoagulation. Patient is on Xarelto at home. Heart rate is well controlled. Pt is followed with Dr. Claiborne Billings -Continue Zebeta and Xarelto -holding Xarelto for possible cardiac cath in a.m., Bycardiology   GERD:  -Protonix  DVT ppx: on Xarelto/holding for possible cardiac cath in a.m./ SCDs  Code Status: Full code Family Communication:  Yes, patient's wife at bed side Disposition Plan:  Anticipate discharge back to previous home environment  Consultants:   Audiology Dr. Claiborne Billings  Procedures:   2D echocardiogram  Pending, anticipating cardiac cath in a.m.  Antimicrobials:  Anti-infectives (From admission, onward)   None       Objective: Vitals:   06/10/17 0958 06/10/17 0959 06/10/17 1014 06/10/17 1200  BP: (!) 106/56 (!) 114/59 (!) 127/58 (!) 104/59  Pulse:  (!) 49 (!) 49 (!) 46  Resp:    20  Temp:    97.8 F (36.6 C)  TempSrc:    Oral  SpO2:    100%  Weight:      Height:        Intake/Output Summary (Last 24 hours) at 06/10/2017 1544 Last data filed at 06/10/2017 0900 Gross per 24 hour  Intake 240 ml  Output 0 ml  Net 240 ml   Filed Weights   06/09/17 1921 06/10/17 0005  Weight: 78.7 kg (173 lb 8 oz) 77.2 kg (170 lb 1.6 oz)    Examination:  General exam: Appears calm and comfortable  Psychiatry: Judgement and  insight appear normal. Mood & affect appropriate. HEENT: WNLs Respiratory system: Clear to auscultation. Respiratory effort normal. Cardiovascular system: S1 & S2 heard, RRR. No JVD, murmurs, rubs, gallops or clicks. No pedal edema. Gastrointestinal system: Abd. nondistended, soft and nontender. No organomegaly or masses felt. Normal bowel sounds heard. Central nervous system: Alert and oriented. No focal neurological deficits. Extremities: Symmetric 5 x 5 power. Skin: No rashes, lesions or ulcers Wounds:    Data Reviewed: I have personally reviewed following labs and imaging studies  CBC: Recent Labs  Lab 06/09/17 1926  WBC 6.8  HGB 14.6  HCT 44.2  MCV 87.9  PLT 957   Basic Metabolic Panel: Recent Labs  Lab 06/09/17 1926  NA 136  K 3.5  CL 102  CO2 23  GLUCOSE 80  BUN 12  CREATININE 1.01  CALCIUM 8.8*   GFR: Estimated Creatinine Clearance: 68.2 mL/min (by C-G formula based on SCr of 1.01 mg/dL). Liver Function Tests: No results for input(s): AST, ALT, ALKPHOS, BILITOT, PROT, ALBUMIN in the last 168 hours. No results for input(s): LIPASE, AMYLASE in the last 168 hours. No results for input(s): AMMONIA in the last 168 hours. Coagulation Profile: No results for input(s): INR, PROTIME in the last 168 hours. Cardiac Enzymes: Recent Labs  Lab 06/09/17 1926 06/10/17 0911  TROPONINI <0.03 <0.03   BNP (last 3 results) No results for input(s): PROBNP in the last 8760 hours. HbA1C: Recent Labs    06/10/17 0108  HGBA1C 6.1*   CBG: No results for input(s): GLUCAP in the last 168 hours. Lipid Profile: Recent Labs    06/10/17 0108  CHOL 122  HDL 41  LDLCALC 60  TRIG 103  CHOLHDL 3.0   Thyroid Function Tests: No results for input(s): TSH, T4TOTAL, FREET4, T3FREE, THYROIDAB in the last 72 hours. Anemia Panel: No results for input(s): VITAMINB12, FOLATE, FERRITIN, TIBC, IRON, RETICCTPCT in the last 72 hours. Sepsis Labs: No results for input(s): PROCALCITON, LATICACIDVEN in the last 168 hours.  No results found for this or any previous visit (from the past 240 hour(s)).    Radiology Studies: Dg Chest 2 View  Result Date: 06/09/2017 CLINICAL DATA:  Chest pressure since 4 p.m. today. EXAM: CHEST  2 VIEW COMPARISON:  June 02, 2017 FINDINGS: The heart size and mediastinal contours are within normal limits. There is no focal infiltrate, pulmonary edema, or pleural effusion. There is chronic elevation of the right hemidiaphragm. The visualized skeletal structures  are unremarkable. IMPRESSION: No active cardiopulmonary disease. Electronically Signed   By: Abelardo Diesel M.D.   On: 06/09/2017 20:22    Scheduled Meds: . acetaminophen  650 mg Oral Once  . aspirin  81 mg Oral Daily  . [START ON 06/11/2017] bisoprolol  5 mg Oral Daily  . cholecalciferol  2,000 Units Oral Daily  . multivitamin with minerals  1 tablet Oral Daily  . niacin  1,000 mg Oral Daily  . pantoprazole  40 mg Oral Daily  . simvastatin  40 mg Oral Daily   Continuous Infusions: . heparin    . nitroGLYCERIN      Time spent: >25 minutes  Deatra James, MD [facility],  Pager [phone]  If 7PM-7AM, please contact night-coverage [email or web address]   Password [facility]  06/10/2017, 3:44 PM

## 2017-06-10 NOTE — Telephone Encounter (Signed)
New Message   Mel is calling from [facility]. Patient was admitted overnight for active chest pains. She is requesting that someone from cardiology reach or come see the patient for a consult. Please call to discuss. The contact person will be Tasha the patients nurse.

## 2017-06-11 ENCOUNTER — Encounter (HOSPITAL_COMMUNITY): Payer: Self-pay | Admitting: Interventional Cardiology

## 2017-06-11 ENCOUNTER — Encounter (HOSPITAL_COMMUNITY)
Admission: EM | Disposition: A | Discharge status: Home / Self Care | Payer: Self-pay | Source: Home / Self Care | Attending: Family Medicine

## 2017-06-11 DIAGNOSIS — I25110 Atherosclerotic heart disease of native coronary artery with unstable angina pectoris: Principal | ICD-10-CM

## 2017-06-11 DIAGNOSIS — E786 Lipoprotein deficiency: Secondary | ICD-10-CM

## 2017-06-11 HISTORY — PX: LEFT HEART CATH AND CORONARY ANGIOGRAPHY: CATH118249

## 2017-06-11 HISTORY — PX: CORONARY STENT INTERVENTION: CATH118234

## 2017-06-11 LAB — CBC
HCT: 42.5 % (ref 39.0–52.0)
Hemoglobin: 13.7 g/dL (ref 13.0–17.0)
MCH: 28.5 pg (ref 26.0–34.0)
MCHC: 32.2 g/dL (ref 30.0–36.0)
MCV: 88.5 fL (ref 78.0–100.0)
PLATELETS: 172 10*3/uL (ref 150–400)
RBC: 4.8 MIL/uL (ref 4.22–5.81)
RDW: 14 % (ref 11.5–15.5)
WBC: 5.9 10*3/uL (ref 4.0–10.5)

## 2017-06-11 LAB — PROTIME-INR
INR: 1.18
Prothrombin Time: 14.9 seconds (ref 11.4–15.2)

## 2017-06-11 LAB — BASIC METABOLIC PANEL
Anion gap: 8 (ref 5–15)
BUN: 15 mg/dL (ref 6–20)
CALCIUM: 8.6 mg/dL — AB (ref 8.9–10.3)
CO2: 22 mmol/L (ref 22–32)
CREATININE: 0.95 mg/dL (ref 0.61–1.24)
Chloride: 107 mmol/L (ref 101–111)
GFR calc Af Amer: >60 mL/min (ref >60)
GLUCOSE: 104 mg/dL — AB (ref 65–99)
Potassium: 3.9 mmol/L (ref 3.5–5.1)
Sodium: 137 mmol/L (ref 135–145)

## 2017-06-11 LAB — POCT ACTIVATED CLOTTING TIME
Activated Clotting Time: 351 seconds
Activated Clotting Time: 307 seconds

## 2017-06-11 LAB — HEPARIN LEVEL (UNFRACTIONATED): Heparin Unfractionated: 0.65 IU/mL (ref 0.30–0.70)

## 2017-06-11 LAB — APTT: APTT: 67 s — AB (ref 24–36)

## 2017-06-11 SURGERY — LEFT HEART CATH AND CORONARY ANGIOGRAPHY
Anesthesia: LOCAL

## 2017-06-11 MED ORDER — VERAPAMIL HCL 2.5 MG/ML IV SOLN
INTRAVENOUS | Status: DC | PRN
  Administered 2017-06-11: 10 mL via INTRA_ARTERIAL

## 2017-06-11 MED ORDER — HEPARIN SODIUM (PORCINE) 1000 UNIT/ML IJ SOLN
INTRAMUSCULAR | Status: DC | PRN
  Administered 2017-06-11: 8000 [IU] via INTRAVENOUS
  Administered 2017-06-11: 4000 [IU] via INTRAVENOUS

## 2017-06-11 MED ORDER — VERAPAMIL HCL 2.5 MG/ML IV SOLN
INTRAVENOUS | Status: AC
  Filled 2017-06-11: qty 2

## 2017-06-11 MED ORDER — FENTANYL CITRATE (PF) 100 MCG/2ML IJ SOLN
INTRAMUSCULAR | Status: DC | PRN
  Administered 2017-06-11 (×2): 25 ug via INTRAVENOUS

## 2017-06-11 MED ORDER — LIDOCAINE HCL (PF) 1 % IJ SOLN
INTRAMUSCULAR | Status: DC | PRN
  Administered 2017-06-11: 2 mL

## 2017-06-11 MED ORDER — NITROGLYCERIN 1 MG/10 ML FOR IR/CATH LAB
INTRA_ARTERIAL | Status: DC | PRN
  Administered 2017-06-11 (×2): 200 ug via INTRACORONARY

## 2017-06-11 MED ORDER — ATORVASTATIN CALCIUM 80 MG PO TABS
80.0000 mg | ORAL_TABLET | Freq: Every day | ORAL | Status: DC
  Administered 2017-06-11: 80 mg via ORAL
  Filled 2017-06-11: qty 1

## 2017-06-11 MED ORDER — MIDAZOLAM HCL 2 MG/2ML IJ SOLN
INTRAMUSCULAR | Status: DC | PRN
Start: 2017-06-11 — End: 2017-06-11
  Administered 2017-06-11 (×2): 1 mg via INTRAVENOUS

## 2017-06-11 MED ORDER — HYDRALAZINE HCL 20 MG/ML IJ SOLN: 5.0000 mg | INTRAMUSCULAR | Status: AC | PRN

## 2017-06-11 MED ORDER — ASPIRIN 81 MG PO CHEW
81.0000 mg | CHEWABLE_TABLET | Freq: Every day | ORAL | Status: DC
  Administered 2017-06-12: 10:00:00 81 mg via ORAL
  Filled 2017-06-11 (×2): qty 1

## 2017-06-11 MED ORDER — HEPARIN SODIUM (PORCINE) 5000 UNIT/ML IJ SOLN: 5000.0000 [IU] | Freq: Three times a day (TID) | INTRAMUSCULAR | Status: DC

## 2017-06-11 MED ORDER — HEPARIN (PORCINE) IN NACL 2-0.9 UNIT/ML-% IJ SOLN
INTRAMUSCULAR | Status: AC | PRN
  Administered 2017-06-11 (×2): 500 mL

## 2017-06-11 MED ORDER — SODIUM CHLORIDE 0.9 % WEIGHT BASED INFUSION: 1.5000 mL/kg/h | INTRAVENOUS | Status: AC

## 2017-06-11 MED ORDER — TICAGRELOR 90 MG PO TABS
ORAL_TABLET | ORAL | Status: DC | PRN
  Administered 2017-06-11: 180 mg via ORAL

## 2017-06-11 MED ORDER — OXYCODONE HCL 5 MG PO TABS
5.0000 mg | ORAL_TABLET | ORAL | Status: DC | PRN
  Administered 2017-06-12: 10:00:00 5 mg via ORAL
  Filled 2017-06-11: qty 1

## 2017-06-11 MED ORDER — HEPARIN (PORCINE) IN NACL 2-0.9 UNIT/ML-% IJ SOLN
INTRAMUSCULAR | Status: AC
  Filled 2017-06-11: qty 1000

## 2017-06-11 MED ORDER — SODIUM CHLORIDE 0.9% FLUSH
3.0000 mL | Freq: Two times a day (BID) | INTRAVENOUS | Status: DC
  Administered 2017-06-11 – 2017-06-12 (×2): 3 mL via INTRAVENOUS

## 2017-06-11 MED ORDER — HEPARIN (PORCINE) IN NACL 100-0.45 UNIT/ML-% IJ SOLN
1000.0000 [IU]/h | INTRAMUSCULAR | Status: DC
  Administered 2017-06-11: 1000 [IU]/h via INTRAVENOUS
  Filled 2017-06-11: qty 250

## 2017-06-11 MED ORDER — HEPARIN SODIUM (PORCINE) 1000 UNIT/ML IJ SOLN
INTRAMUSCULAR | Status: AC
  Filled 2017-06-11: qty 1

## 2017-06-11 MED ORDER — SODIUM CHLORIDE 0.9 % IV SOLN: Freq: Once | INTRAVENOUS | Status: DC

## 2017-06-11 MED ORDER — ONDANSETRON HCL 4 MG/2ML IJ SOLN: 4.0000 mg | Freq: Four times a day (QID) | INTRAMUSCULAR | Status: DC | PRN

## 2017-06-11 MED ORDER — IOPAMIDOL (ISOVUE-370) INJECTION 76%
INTRAVENOUS | Status: DC | PRN
  Administered 2017-06-11: 100 mL via INTRA_ARTERIAL

## 2017-06-11 MED ORDER — LIDOCAINE HCL 1 % IJ SOLN
INTRAMUSCULAR | Status: AC
  Filled 2017-06-11: qty 20

## 2017-06-11 MED ORDER — IOPAMIDOL (ISOVUE-300) INJECTION 61%
INTRAVENOUS | Status: AC
  Filled 2017-06-11: qty 50

## 2017-06-11 MED ORDER — SODIUM CHLORIDE 0.9 % IV SOLN: 250.0000 mL | INTRAVENOUS | Status: DC | PRN

## 2017-06-11 MED ORDER — LABETALOL HCL 5 MG/ML IV SOLN: 10.0000 mg | INTRAVENOUS | Status: AC | PRN

## 2017-06-11 MED ORDER — IOPAMIDOL (ISOVUE-370) INJECTION 76%
INTRAVENOUS | Status: AC
  Filled 2017-06-11: qty 100

## 2017-06-11 MED ORDER — FENTANYL CITRATE (PF) 100 MCG/2ML IJ SOLN
INTRAMUSCULAR | Status: AC
  Filled 2017-06-11: qty 2

## 2017-06-11 MED ORDER — MIDAZOLAM HCL 2 MG/2ML IJ SOLN
INTRAMUSCULAR | Status: AC
  Filled 2017-06-11: qty 2

## 2017-06-11 MED ORDER — CLOPIDOGREL BISULFATE 75 MG PO TABS
300.0000 mg | ORAL_TABLET | Freq: Once | ORAL | Status: AC
  Administered 2017-06-12: 10:00:00 300 mg via ORAL
  Filled 2017-06-11: qty 4

## 2017-06-11 MED ORDER — SODIUM CHLORIDE 0.9% FLUSH: 3.0000 mL | INTRAVENOUS | Status: DC | PRN

## 2017-06-11 MED ORDER — TICAGRELOR 90 MG PO TABS
90.0000 mg | ORAL_TABLET | Freq: Two times a day (BID) | ORAL | Status: AC
  Administered 2017-06-11: 90 mg via ORAL
  Filled 2017-06-11: qty 1

## 2017-06-11 MED ORDER — ACETAMINOPHEN 325 MG PO TABS: 650.0000 mg | ORAL_TABLET | ORAL | Status: DC | PRN

## 2017-06-11 SURGICAL SUPPLY — 17 items
BALLN SAPPHIRE 2.5X12 (BALLOONS) ×2
BALLN ~~LOC~~ EUPHORA RX 2.75X8 (BALLOONS) ×2
BALLOON SAPPHIRE 2.5X12 (BALLOONS) ×1 IMPLANT
BALLOON ~~LOC~~ EUPHORA RX 2.75X8 (BALLOONS) ×1 IMPLANT
CATH 5FR JL3.5 JR4 ANG PIG MP (CATHETERS) ×2 IMPLANT
CATH VISTA GUIDE 6FR XBLAD3.5 (CATHETERS) ×2 IMPLANT
DEVICE RAD COMP TR BAND LRG (VASCULAR PRODUCTS) ×2 IMPLANT
GLIDESHEATH SLEND A-KIT 6F 22G (SHEATH) ×2 IMPLANT
GUIDEWIRE INQWIRE 1.5J.035X260 (WIRE) ×1 IMPLANT
INQWIRE 1.5J .035X260CM (WIRE) ×2
KIT ENCORE 26 ADVANTAGE (KITS) ×2 IMPLANT
KIT PREMIUM HAND CONTROLLER (KITS) ×2 IMPLANT
PACK CARDIAC CATHETERIZATION (CUSTOM PROCEDURE TRAY) ×2 IMPLANT
PROTECTION STATION PRESSURIZED (MISCELLANEOUS) ×2
STATION PROTECTION PRESSURIZED (MISCELLANEOUS) ×1 IMPLANT
STENT SYNERGY DES 2.75X16 (Permanent Stent) ×2 IMPLANT
WIRE COUGAR XT STRL 190CM (WIRE) ×2 IMPLANT

## 2017-06-11 NOTE — Progress Notes (Signed)
#   5.n  S/ W MIKE @ [place] TEAM # [phone]   4. BRILINTA 90 MG BID  (none preferred [facility] )  COVER- YES  CO-PAY- $ 116.51 Q/L TWO PILL PER DAY  TIER - 4 DRUG  PRIOR APPROVAL- NO   2.XARELTO 15 MG BID ( preferred brand )  COVER- YES  CO-PAY- $ 156.90  TIER- 3 DRUG  PRIOR APPROVAL- NO   3 XARELTO 20 MG DAILY  ( preferred brand )  COVER- YES  CO-PAY- $ 78.47  TIER- 3 DRUG  PRIOR APPROVAL- NO   NO DEDUCTIBLE   PREFERRED PHARMACY : [facility]

## 2017-06-11 NOTE — Care Management Note (Addendum)
Case Management Note  Patient Details  Name: Jose Warner MRN: [id number] Date of Birth: 1950-09-03  Subjective/Objective:  From home with wife, pta indep, s/p coronary stent intervention, will be on brilinta, NCM awaiting benefit check.   [facility] [address].  Patient states he is on xarelto as well and would like for NCM to check what co pay is for this, he has been taking 30 day free but does not know what co pay is for xarelto.  [name], BSN - patient will now be on plavix.                  Action/Plan: NCM will follow for transition of care needs.   Expected Discharge Date:                  Expected Discharge Plan:  Home/Self Care  In-House Referral:     Discharge planning Services  CM Consult  Post Acute Care Choice:    Choice offered to:     DME Arranged:    DME Agency:     HH Arranged:    [place] Agency:     Status of Service:  Completed, signed off  If discussed at [employer] of Stay Meetings, dates discussed:    Additional Comments:  Zenon Mayo, RN 06/11/2017, 2:22 PM

## 2017-06-11 NOTE — Interval H&P Note (Signed)
Cath Lab Visit (complete for each Cath Lab visit)  Clinical Evaluation Leading to the Procedure:   ACS: Yes.    Non-ACS:    Anginal Classification: CCS IV  Anti-ischemic medical therapy: Minimal Therapy (1 class of medications)  Non-Invasive Test Results: No non-invasive testing performed  Prior CABG: No previous CABG      History and Physical Interval Note:  06/11/2017 9:20 AM  Jose Warner  has presented today for surgery, with the diagnosis of [place]  The various methods of treatment have been discussed with the patient and family. After consideration of risks, benefits and other options for treatment, the patient has consented to  Procedure(s): LEFT HEART CATH AND CORONARY ANGIOGRAPHY (N/A) as a surgical intervention .  The patient's history has been reviewed, patient examined, no change in status, stable for surgery.  I have reviewed the patient's chart and labs.  Questions were answered to the patient's satisfaction.     Belva Crome III

## 2017-06-11 NOTE — Progress Notes (Signed)
ANTICOAGULATION CONSULT NOTE  Pharmacy Consult for heparin Indication: chest pain/ACS / Afib  Allergies  Allergen Reactions  . Gluten Meal Other (See Comments)    celiac  . Wheat Bran Other (See Comments)    Celiac   . Sulfa Antibiotics Rash    Patient Measurements: Height: 5\' 7"  (170.2 cm) Weight: 170 lb 1.6 oz (77.2 kg)(scale b) IBW/kg (Calculated) : 66.1   Vital Signs: Temp: 98.6 F (37 C) (02/20 2349) Temp Source: Oral (02/20 2349) BP: 119/66 (02/20 2349) Pulse Rate: 62 (02/20 2349)  Labs: Recent Labs    06/09/17 1926 06/10/17 0911 06/10/17 1342 06/10/17 1344 06/10/17 1533 06/10/17 2126 06/11/17 0237  HGB 14.6  --   --   --   --   --  13.7  HCT 44.2  --   --   --   --   --  42.5  PLT 190  --   --   --   --   --  172  APTT  --   --  32  --   --   --  67*  LABPROT  --   --   --   --   --   --  14.9  INR  --   --   --   --   --   --  1.18  HEPARINUNFRC  --   --   --  0.99*  --   --   --   CREATININE 1.01  --   --   --   --   --   --   TROPONINI <0.03 <0.03  --   --  <0.03 <0.03  --     Estimated Creatinine Clearance: 68.2 mL/min (by C-G formula based on SCr of 1.01 mg/dL).  Assessment: 67 yo male admitted with chest pain, h/o Afib and Eliquis on hold, for heparin  Goal of Therapy:  Heparin level 0.3-0.7 units/ml aPTT 66-102 seconds Monitor platelets by anticoagulation protocol: Yes   Plan:  Continue Heparin at current rate Follow up after cath today   Phillis Knack, PharmD, BCPS  06/11/2017 3:52 AM

## 2017-06-11 NOTE — Progress Notes (Signed)
Progress Note  Patient Name: Jose Warner Date of Encounter: 06/11/2017  Primary Cardiologist: Claiborne Billings  Subjective   Had recurrent chest pain at 3 am and again at 5 am.  On heparin/iv NTG.  Inpatient Medications    Scheduled Meds: . aspirin  81 mg Oral Daily  . bisoprolol  5 mg Oral Daily  . cholecalciferol  2,000 Units Oral Daily  . mometasone-formoterol  2 puff Inhalation BID  . multivitamin with minerals  1 tablet Oral Daily  . niacin  1,000 mg Oral Daily  . pantoprazole  40 mg Oral Daily  . simvastatin  40 mg Oral Daily  . sodium chloride flush  3 mL Intravenous Q12H   Continuous Infusions: . sodium chloride 250 mL (06/10/17 2037)  . sodium chloride 1 mL/kg/hr (06/11/17 0728)  . heparin 1,000 Units/hr (06/10/17 2038)  . nitroGLYCERIN 15 mcg/min (06/11/17 0750)   PRN Meds: sodium chloride, acetaminophen, albuterol, ALPRAZolam, morphine injection, nitroGLYCERIN, ondansetron (ZOFRAN) IV, sodium chloride flush, zolpidem   Vital Signs    Vitals:   06/10/17 1200 06/10/17 1953 06/10/17 2349 06/11/17 0455  BP: (!) 104/59 107/63 119/66 119/79  Pulse: (!) 46 (!) 55 62 60  Resp: 20 17 12 17   Temp: 97.8 F (36.6 C) 98.3 F (36.8 C) 98.6 F (37 C) 98.6 F (37 C)  TempSrc: Oral Oral Oral Oral  SpO2: 100% 99% 99% 99%  Weight:    165 lb 2 oz (74.9 kg)  Height:        Intake/Output Summary (Last 24 hours) at 06/11/2017 0802 Last data filed at 06/11/2017 0617 Gross per 24 hour  Intake 433.17 ml  Output -  Net 433.17 ml    I/O since admission: +433  Filed Weights   06/09/17 1921 06/10/17 0005 06/11/17 0455  Weight: 173 lb 8 oz (78.7 kg) 170 lb 1.6 oz (77.2 kg) 165 lb 2 oz (74.9 kg)    Telemetry    Sinus - Personally Reviewed  ECG    ECG (independently read by me): Sinus Bradycardia at 54; no STT changes  Physical Exam   BP 119/79 (BP Location: Right Arm)   Pulse 60   Temp 98.6 F (37 C) (Oral)   Resp 17   Ht 5' 7"  (1.702 m)   Wt 165 lb 2 oz (74.9  kg)   SpO2 99%   BMI 25.86 kg/m  General: Alert, oriented, no distress.  Skin: normal turgor, no rashes, warm and dry HEENT: Normocephalic, atraumatic. Pupils equal round and reactive to light; sclera anicteric; extraocular muscles intact;  Nose without nasal septal hypertrophy Mouth/Parynx benign; Mallinpatti scale 3 Neck: No JVD, no carotid bruits; normal carotid upstroke Lungs: clear to ausculatation and percussion; no wheezing or rales Chest wall: without tenderness to palpitation Heart: PMI not displaced, RRR, s1 s2 normal, 1/6 systolic murmur, no diastolic murmur, no rubs, gallops, thrills, or heaves Abdomen: soft, nontender; no hepatosplenomehaly, BS+; abdominal aorta nontender and not dilated by palpation. Back: no CVA tenderness Pulses 2+ Musculoskeletal: full range of motion, normal strength, no joint deformities Extremities: no clubbing cyanosis or edema, Homan's sign negative  Neurologic: grossly nonfocal; Cranial nerves grossly wnl Psychologic: Normal mood and affect   Labs    Chemistry Recent Labs  Lab 06/09/17 1926 06/11/17 0237  NA 136 137  K 3.5 3.9  CL 102 107  CO2 23 22  GLUCOSE 80 104*  BUN 12 15  CREATININE 1.01 0.95  CALCIUM 8.8* 8.6*  GFRNONAA >60 >60  GFRAA >  60 >60  ANIONGAP 11 8     Hematology Recent Labs  Lab 06/09/17 1926 06/11/17 0237  WBC 6.8 5.9  RBC 5.03 4.80  HGB 14.6 13.7  HCT 44.2 42.5  MCV 87.9 88.5  MCH 29.0 28.5  MCHC 33.0 32.2  RDW 14.0 14.0  PLT 190 172    Cardiac Enzymes Recent Labs  Lab 06/09/17 1926 06/10/17 0911 06/10/17 1533 06/10/17 2126  TROPONINI <0.03 <0.03 <0.03 <0.03   No results for input(s): TROPIPOC in the last 168 hours.   BNPNo results for input(s): BNP, PROBNP in the last 168 hours.   DDimer No results for input(s): DDIMER in the last 168 hours.   Lipid Panel  `      Component Value Date/Time   CHOL 122 06/10/2017 0108   TRIG 103 06/10/2017 0108   HDL 41 06/10/2017 0108   CHOLHDL 3.0  06/10/2017 0108   VLDL 21 06/10/2017 0108   LDLCALC 60 06/10/2017 0108    Radiology    Dg Chest 2 View  Result Date: 06/09/2017 CLINICAL DATA:  Chest pressure since 4 p.m. today. EXAM: CHEST  2 VIEW COMPARISON:  June 02, 2017 FINDINGS: The heart size and mediastinal contours are within normal limits. There is no focal infiltrate, pulmonary edema, or pleural effusion. There is chronic elevation of the right hemidiaphragm. The visualized skeletal structures are unremarkable. IMPRESSION: No active cardiopulmonary disease. Electronically Signed   By: Abelardo Diesel M.D.   On: 06/09/2017 20:22    Cardiac Studies   ------------------------------------------------------------------- 06/09/2017 ECHO Study Conclusions  - Left ventricle: The cavity size was normal. Wall thickness was   normal. Systolic function was normal. The estimated ejection   fraction was in the range of 50% to 55%. Wall motion was normal;   there were no regional wall motion abnormalities. Doppler   parameters are consistent with abnormal left ventricular   relaxation (grade 1 diastolic dysfunction). - Aortic valve: Trileaflet; mildly thickened, mildly calcified   leaflets. There was mild regurgitation. - Mitral valve: There was trivial regurgitation. - Left atrium: The atrium was mildly dilated. Anterior-posterior   dimension: 43 mm.  Patient Profile     Jose Warner is a 67 y.o. male with a hx of PAF, HLD, Celiac disease, GERD and FH premature CAD, with non-obstructive disease on cath 2006. He had developed recent PAF and was cardioverted in ER on 06/02/16. He was admitted with chest tightness worrisome for unstable angina.   Assessment & Plan    1. Recurrent chest tightness: on heparin and NTG;  With recurrent symptoms will move cath up to this am (last dose of xarelto was 2/19 at 6 pm)  Trop negatives; no significant ECG changes but symptoms worrisome for USAP.  2. PAF: maintaining Sinus rhythm, s/p ER  cardioversion on 2/23 at which time Xarelto was started  3. Mixed Hyperlipidemia:  On simvastatin with LDL 60, TG 103, HDL 41; he has been kept on niacin by his primary MD. In past HDL was 23  4. H/O prostate CA; s/p robotic prostatectomy in 2014 and developed initail PAF episode at that time.  5. Celiac disease: on gluten free diet  6. With recent nocturnal symptoms and PAF may need evaluation as outpatent for OSA.   Signed, Troy Sine, MD, [facility] 06/11/2017, 8:02 AM

## 2017-06-11 NOTE — Progress Notes (Signed)
TR BAND REMOVAL  LOCATION:    right radial  DEFLATED PER PROTOCOL:    Yes.    TIME BAND OFF / DRESSING APPLIED:    1540   SITE UPON ARRIVAL:    Level 0  SITE AFTER BAND REMOVAL:    Level 0  CIRCULATION SENSATION AND MOVEMENT:    Within Normal Limits   Yes.    COMMENTS:   Tolerated well, post removal instructions provided. Small bruise noted, soft to touch, no bleeding noted.

## 2017-06-11 NOTE — Progress Notes (Signed)
ANTICOAGULATION CONSULT NOTE   Pharmacy Consult for heparin Indication: chest pain/ACS / Afib  Allergies  Allergen Reactions  . Gluten Meal Other (See Comments)    celiac  . Wheat Bran Other (See Comments)    Celiac   . Sulfa Antibiotics Rash    Patient Measurements: Height: 5\' 7"  (170.2 cm) Weight: 165 lb 2 oz (74.9 kg) IBW/kg (Calculated) : 66.1   Vital Signs: Temp: 98.1 F (36.7 C) (02/21 1500) Temp Source: Oral (02/21 1500) BP: 103/52 (02/21 1700) Pulse Rate: 65 (02/21 1700)  Labs: Recent Labs    06/09/17 1926 06/10/17 0911 06/10/17 1342 06/10/17 1344 06/10/17 1533 06/10/17 2126 06/11/17 0237  HGB 14.6  --   --   --   --   --  13.7  HCT 44.2  --   --   --   --   --  42.5  PLT 190  --   --   --   --   --  172  APTT  --   --  32  --   --   --  67*  LABPROT  --   --   --   --   --   --  14.9  INR  --   --   --   --   --   --  1.18  HEPARINUNFRC  --   --   --  0.99*  --   --  0.65  CREATININE 1.01  --   --   --   --   --  0.95  TROPONINI <0.03 <0.03  --   --  <0.03 <0.03  --     Estimated Creatinine Clearance: 72.5 mL/min (by C-G formula based on SCr of 0.95 mg/dL).  Assessment: 67 yo male admitted with chest pain, h/o Afib s/p DCCV last week and was on Xarelto. Started on heparin here pre-cath and now to be resumed after sheath removal given recent DCCV. Patient also to transition from ticagrelor to Plavix tomorrow.  Therapeutic on heparin at 1000 units/hr pre-cath (heparin level 0.65 units/mL, aPTT 67)  Goal of Therapy:  Heparin level 0.3-0.7 units/ml aPTT 66-102 seconds Monitor platelets by anticoagulation protocol: Yes   Plan:  Restart heparin at 1000 units/hr Heparin level and aPTT with AM labs  Inmer Nix D. Janaki Exley, PharmD, [place] Clinical Pharmacist [phone] 06/11/2017 8:27 PM

## 2017-06-11 NOTE — H&P (View-Only) (Signed)
Progress Note  Patient Name: Jose Warner Date of Encounter: 06/11/2017  Primary Cardiologist: Claiborne Billings  Subjective   Had recurrent chest pain at 3 am and again at 5 am.  On heparin/iv NTG.  Inpatient Medications    Scheduled Meds: . aspirin  81 mg Oral Daily  . bisoprolol  5 mg Oral Daily  . cholecalciferol  2,000 Units Oral Daily  . mometasone-formoterol  2 puff Inhalation BID  . multivitamin with minerals  1 tablet Oral Daily  . niacin  1,000 mg Oral Daily  . pantoprazole  40 mg Oral Daily  . simvastatin  40 mg Oral Daily  . sodium chloride flush  3 mL Intravenous Q12H   Continuous Infusions: . sodium chloride 250 mL (06/10/17 2037)  . sodium chloride 1 mL/kg/hr (06/11/17 0728)  . heparin 1,000 Units/hr (06/10/17 2038)  . nitroGLYCERIN 15 mcg/min (06/11/17 0750)   PRN Meds: sodium chloride, acetaminophen, albuterol, ALPRAZolam, morphine injection, nitroGLYCERIN, ondansetron (ZOFRAN) IV, sodium chloride flush, zolpidem   Vital Signs    Vitals:   06/10/17 1200 06/10/17 1953 06/10/17 2349 06/11/17 0455  BP: (!) 104/59 107/63 119/66 119/79  Pulse: (!) 46 (!) 55 62 60  Resp: 20 17 12 17   Temp: 97.8 F (36.6 C) 98.3 F (36.8 C) 98.6 F (37 C) 98.6 F (37 C)  TempSrc: Oral Oral Oral Oral  SpO2: 100% 99% 99% 99%  Weight:    165 lb 2 oz (74.9 kg)  Height:        Intake/Output Summary (Last 24 hours) at 06/11/2017 0802 Last data filed at 06/11/2017 0617 Gross per 24 hour  Intake 433.17 ml  Output -  Net 433.17 ml    I/O since admission: +433  Filed Weights   06/09/17 1921 06/10/17 0005 06/11/17 0455  Weight: 173 lb 8 oz (78.7 kg) 170 lb 1.6 oz (77.2 kg) 165 lb 2 oz (74.9 kg)    Telemetry    Sinus - Personally Reviewed  ECG    ECG (independently read by me): Sinus Bradycardia at 54; no STT changes  Physical Exam   BP 119/79 (BP Location: Right Arm)   Pulse 60   Temp 98.6 F (37 C) (Oral)   Resp 17   Ht 5' 7"  (1.702 m)   Wt 165 lb 2 oz (74.9  kg)   SpO2 99%   BMI 25.86 kg/m  General: Alert, oriented, no distress.  Skin: normal turgor, no rashes, warm and dry HEENT: Normocephalic, atraumatic. Pupils equal round and reactive to light; sclera anicteric; extraocular muscles intact;  Nose without nasal septal hypertrophy Mouth/Parynx benign; Mallinpatti scale 3 Neck: No JVD, no carotid bruits; normal carotid upstroke Lungs: clear to ausculatation and percussion; no wheezing or rales Chest wall: without tenderness to palpitation Heart: PMI not displaced, RRR, s1 s2 normal, 1/6 systolic murmur, no diastolic murmur, no rubs, gallops, thrills, or heaves Abdomen: soft, nontender; no hepatosplenomehaly, BS+; abdominal aorta nontender and not dilated by palpation. Back: no CVA tenderness Pulses 2+ Musculoskeletal: full range of motion, normal strength, no joint deformities Extremities: no clubbing cyanosis or edema, Homan's sign negative  Neurologic: grossly nonfocal; Cranial nerves grossly wnl Psychologic: Normal mood and affect   Labs    Chemistry Recent Labs  Lab 06/09/17 1926 06/11/17 0237  NA 136 137  K 3.5 3.9  CL 102 107  CO2 23 22  GLUCOSE 80 104*  BUN 12 15  CREATININE 1.01 0.95  CALCIUM 8.8* 8.6*  GFRNONAA >60 >60  GFRAA >  60 >60  ANIONGAP 11 8     Hematology Recent Labs  Lab 06/09/17 1926 06/11/17 0237  WBC 6.8 5.9  RBC 5.03 4.80  HGB 14.6 13.7  HCT 44.2 42.5  MCV 87.9 88.5  MCH 29.0 28.5  MCHC 33.0 32.2  RDW 14.0 14.0  PLT 190 172    Cardiac Enzymes Recent Labs  Lab 06/09/17 1926 06/10/17 0911 06/10/17 1533 06/10/17 2126  TROPONINI <0.03 <0.03 <0.03 <0.03   No results for input(s): TROPIPOC in the last 168 hours.   BNPNo results for input(s): BNP, PROBNP in the last 168 hours.   DDimer No results for input(s): DDIMER in the last 168 hours.   Lipid Panel  `      Component Value Date/Time   CHOL 122 06/10/2017 0108   TRIG 103 06/10/2017 0108   HDL 41 06/10/2017 0108   CHOLHDL 3.0  06/10/2017 0108   VLDL 21 06/10/2017 0108   LDLCALC 60 06/10/2017 0108    Radiology    Dg Chest 2 View  Result Date: 06/09/2017 CLINICAL DATA:  Chest pressure since 4 p.m. today. EXAM: CHEST  2 VIEW COMPARISON:  June 02, 2017 FINDINGS: The heart size and mediastinal contours are within normal limits. There is no focal infiltrate, pulmonary edema, or pleural effusion. There is chronic elevation of the right hemidiaphragm. The visualized skeletal structures are unremarkable. IMPRESSION: No active cardiopulmonary disease. Electronically Signed   By: Abelardo Diesel M.D.   On: 06/09/2017 20:22    Cardiac Studies   ------------------------------------------------------------------- 06/09/2017 ECHO Study Conclusions  - Left ventricle: The cavity size was normal. Wall thickness was   normal. Systolic function was normal. The estimated ejection   fraction was in the range of 50% to 55%. Wall motion was normal;   there were no regional wall motion abnormalities. Doppler   parameters are consistent with abnormal left ventricular   relaxation (grade 1 diastolic dysfunction). - Aortic valve: Trileaflet; mildly thickened, mildly calcified   leaflets. There was mild regurgitation. - Mitral valve: There was trivial regurgitation. - Left atrium: The atrium was mildly dilated. Anterior-posterior   dimension: 43 mm.  Patient Profile     Jose Warner is a 67 y.o. male with a hx of PAF, HLD, Celiac disease, GERD and FH premature CAD, with non-obstructive disease on cath 2006. He had developed recent PAF and was cardioverted in ER on 06/02/16. He was admitted with chest tightness worrisome for unstable angina.   Assessment & Plan    1. Recurrent chest tightness: on heparin and NTG;  With recurrent symptoms will move cath up to this am (last dose of xarelto was 2/19 at 6 pm)  Trop negatives; no significant ECG changes but symptoms worrisome for USAP.  2. PAF: maintaining Sinus rhythm, s/p ER  cardioversion on 2/23 at which time Xarelto was started  3. Mixed Hyperlipidemia:  On simvastatin with LDL 60, TG 103, HDL 41; he has been kept on niacin by his primary MD. In past HDL was 23  4. H/O prostate CA; s/p robotic prostatectomy in 2014 and developed initail PAF episode at that time.  5. Celiac disease: on gluten free diet  6. With recent nocturnal symptoms and PAF may need evaluation as outpatent for OSA.   Signed, Troy Sine, MD, [facility] 06/11/2017, 8:02 AM

## 2017-06-11 NOTE — Progress Notes (Signed)
PROGRESS NOTE    Patient: Jose Warner     PCP: Derinda Late, MD                    DOB: 04/11/1951            DOA: 06/09/2017 [id number]             DOS: 06/11/2017, 3:23 PM   LOS: 1 day   Date of Service: The patient was seen and examined on 06/11/2017  Subjective:   Patient was seen and examined.  Continues to explain chest tightness overnight, has been on heparin drip overnight. Cardiology following closely Moved cardiac catheterization to early this morning as patient was symptomatic overnight.   -----------------------------------------------------------------------------------------------------------------  Brief Narrative:   [name] is a 67 y.o. male with medical history significant of GERD, prostate cancer (s/p of radical prostatectomy), anemia, hepatitis, diverticulosis, celiac disease, PAF onXarelto, CAD, who presents with chest pain.  This post recent cardioversion. Resenting with acute onset of chest pain, mild shortness of breath. Initial workup in the ED was negative with exception of bradycardia.  -----------------------------------------------------------------------------------------------------------------  Principal Problem:   CAD (coronary artery disease) Active Problems:   Low HDL (under 40)   Paroxysmal atrial fibrillation (HCC)   Chest pain   GERD (gastroesophageal reflux disease)   Chest pain due to CAD   Assessment & Plan:    Chest pain and hx of CAD -angina Overnight complaining of chest tightness, on heparin drip and nitroglycerin Enzyme has been negative thus far,  No acute changes on EKG, 2D echo was reviewed His cardiac catheterization was moved up to early this a.m. as patient was symptomatic overnight,   Status post: Cardiac cath : ACS, unstable angina, 95% mild LAD obstruction ejection fraction 60%, status post successful angioplasty and stenting of LAD On aspirin and Brilinta  Cardiology Dr. Claiborne Billings has been  consulted PRN nitroglycerin, morphine, asa, Zocor We will continue to manage cannula,his beta-blocker to be reduced -Holding Xarelto   Low HDL (under 40): -Full atorvastatin 80 mg Q daily   Atrial Fibrillation:  Status post recent cardioversion on 1/23 CHA2DS2-VASc Score is 2, needs oral anticoagulation. Patient is on Xarelto at home. Heart rate is well controlled. Pt is followed with Dr. Claiborne Billings -holding Xarelto for possible cardiac cath in a.m., Bycardiology   GERD:  -Protonix  DVT ppx: on Xarelto/ was holding for cardiac cath / SCDs  Code Status: Full code Family Communication:  Yes, patient's wife at bed side Disposition Plan:  Anticipate discharge back to previous home environment in [place]   Consultants:   Audiology Dr. Claiborne Billings  Procedures:   2D echocardiogram  Pending, anticipating cardiac cath in a.m.  Antimicrobials:  Anti-infectives (From admission, onward)   None       Objective: Vitals:   06/11/17 1400 06/11/17 1415 06/11/17 1430 06/11/17 1500  BP: (!) 100/52 (!) 122/58 119/78 (!) 119/53  Pulse: 90 88 89 87  Resp: (!) 24 (!) 27 (!) 27 (!) 25  Temp:      TempSrc:      SpO2:  (!) 88% 90% 93%  Weight:      Height:        Intake/Output Summary (Last 24 hours) at 06/11/2017 1523 Last data filed at 06/11/2017 0617 Gross per 24 hour  Intake 193.17 ml  Output -  Net 193.17 ml   Filed Weights   06/09/17 1921 06/10/17 0005 06/11/17 0455  Weight: 78.7 kg (173 lb 8 oz) 77.2  kg (170 lb 1.6 oz) 74.9 kg (165 lb 2 oz)    Examination: BP (!) 119/53   Pulse 87   Temp 98.1 F (36.7 C) (Oral)   Resp (!) 25   Ht 5' 7"  (1.702 m)   Wt 74.9 kg (165 lb 2 oz)   SpO2 93%   BMI 25.86 kg/m    Physical Exam  Constitution:  Alert, cooperative, no distress,  Psychiatric: Normal and stable mood and affect, cognition intact,   HEENT: Normocephalic, PERRL, otherwise with in Normal limits  Cardio vascular:  S1/S2, RRR, No murmure, No Rubs or Gallops  Chest/pulmonary:  Clear to auscultation bilaterally, respirations unlabored  Chest symmetric Abdomen: Soft, non-tender, non-distended, bowel sounds,no masses, no organomegaly Muscular skeletal: Limited exam - in bed, able to move all 4 extremities, Normal strength,  Neuro: CNII-XII intact. , normal motor and sensation, reflexes intact  Extremities: No pitting edema lower extremities, +2 pulses  Skin: Dry, warm to touch, negative for any Rashes, No open wounds   Data Reviewed: I have personally reviewed following labs and imaging studies  CBC: Recent Labs  Lab 06/09/17 1926 06/11/17 0237  WBC 6.8 5.9  HGB 14.6 13.7  HCT 44.2 42.5  MCV 87.9 88.5  PLT 190 212   Basic Metabolic Panel: Recent Labs  Lab 06/09/17 1926 06/11/17 0237  NA 136 137  K 3.5 3.9  CL 102 107  CO2 23 22  GLUCOSE 80 104*  BUN 12 15  CREATININE 1.01 0.95  CALCIUM 8.8* 8.6*   Coagulation Profile: Recent Labs  Lab 06/11/17 0237  INR 1.18   Cardiac Enzymes: Recent Labs  Lab 06/09/17 1926 06/10/17 0911 06/10/17 1533 06/10/17 2126  TROPONINI <0.03 <0.03 <0.03 <0.03   BNP (last 3 results) No results for input(s): PROBNP in the last 8760 hours. HbA1C: Recent Labs    06/10/17 0108  HGBA1C 6.1*   CBG: No results for input(s): GLUCAP in the last 168 hours. Lipid Profile: Recent Labs    06/10/17 0108  CHOL 122  HDL 41  LDLCALC 60  TRIG 103  CHOLHDL 3.0   T Recent Results (from the past 240 hour(s))  MRSA PCR Screening     Status: None   Collection Time: 06/10/17  4:11 PM  Result Value Ref Range Status   MRSA by PCR NEGATIVE NEGATIVE Final    Comment:        The GeneXpert MRSA Assay (FDA approved for NASAL specimens only), is one component of a comprehensive MRSA colonization surveillance program. It is not intended to diagnose MRSA infection nor to guide or monitor treatment for MRSA infections. Performed at [facility], [place] [address], [place], [place] [address]       Radiology  Studies: Dg Chest 2 View  Result Date: 06/09/2017 CLINICAL DATA:  Chest pressure since 4 p.m. today. EXAM: CHEST  2 VIEW COMPARISON:  June 02, 2017 FINDINGS: The heart size and mediastinal contours are within normal limits. There is no focal infiltrate, pulmonary edema, or pleural effusion. There is chronic elevation of the right hemidiaphragm. The visualized skeletal structures are unremarkable. IMPRESSION: No active cardiopulmonary disease. Electronically Signed   By: Abelardo Diesel M.D.   On: 06/09/2017 20:22    Scheduled Meds: . aspirin  81 mg Oral Daily  . atorvastatin  80 mg Oral q1800  . bisoprolol  5 mg Oral Daily  . cholecalciferol  2,000 Units Oral Daily  . heparin  5,000 Units Subcutaneous Q8H  . mometasone-formoterol  2 puff Inhalation BID  . multivitamin with minerals  1 tablet Oral Daily  . niacin  1,000 mg Oral Daily  . pantoprazole  40 mg Oral Daily  . sodium chloride flush  3 mL Intravenous Q12H  . ticagrelor  90 mg Oral BID   Continuous Infusions: . sodium chloride    . sodium chloride 1.5 mL/kg/hr (06/11/17 1130)    Time spent: >25 minutes  Deatra James, MD [facility],  Pager [phone]  If 7PM-7AM, please contact night-coverage [email or web address]   Password [facility]  06/11/2017, 3:23 PM

## 2017-06-12 DIAGNOSIS — I200 Unstable angina: Secondary | ICD-10-CM

## 2017-06-12 LAB — HEPARIN LEVEL (UNFRACTIONATED): HEPARIN UNFRACTIONATED: 0.43 [IU]/mL (ref 0.30–0.70)

## 2017-06-12 LAB — HEPATIC FUNCTION PANEL
ALT: 26 U/L (ref 17–63)
AST: 45 U/L — ABNORMAL HIGH (ref 15–41)
Albumin: 3.4 g/dL — ABNORMAL LOW (ref 3.5–5.0)
Alkaline Phosphatase: 43 U/L (ref 38–126)
BILIRUBIN DIRECT: 0.2 mg/dL (ref 0.1–0.5)
BILIRUBIN INDIRECT: 0.5 mg/dL (ref 0.3–0.9)
TOTAL PROTEIN: 5.7 g/dL — AB (ref 6.5–8.1)
Total Bilirubin: 0.7 mg/dL (ref 0.3–1.2)

## 2017-06-12 LAB — CBC
HCT: 40.6 % (ref 39.0–52.0)
Hemoglobin: 13.8 g/dL (ref 13.0–17.0)
MCH: 30 pg (ref 26.0–34.0)
MCHC: 34 g/dL (ref 30.0–36.0)
MCV: 88.3 fL (ref 78.0–100.0)
Platelets: 162 10*3/uL (ref 150–400)
RBC: 4.6 MIL/uL (ref 4.22–5.81)
RDW: 14.4 % (ref 11.5–15.5)
WBC: 5.9 10*3/uL (ref 4.0–10.5)

## 2017-06-12 LAB — APTT: aPTT: 72 seconds — ABNORMAL HIGH (ref 24–36)

## 2017-06-12 LAB — BASIC METABOLIC PANEL
Anion gap: 12 (ref 5–15)
BUN: 10 mg/dL (ref 6–20)
CALCIUM: 8.6 mg/dL — AB (ref 8.9–10.3)
CO2: 18 mmol/L — ABNORMAL LOW (ref 22–32)
CREATININE: 0.92 mg/dL (ref 0.61–1.24)
Chloride: 109 mmol/L (ref 101–111)
GFR calc Af Amer: >60 mL/min (ref >60)
GLUCOSE: 101 mg/dL — AB (ref 65–99)
POTASSIUM: 4 mmol/L (ref 3.5–5.1)
SODIUM: 139 mmol/L (ref 135–145)

## 2017-06-12 MED ORDER — RIVAROXABAN 20 MG PO TABS: 20.0000 mg | ORAL_TABLET | Freq: Every day | ORAL | Status: DC

## 2017-06-12 MED ORDER — ANGIOPLASTY BOOK
Freq: Once | Status: AC
  Administered 2017-06-12: 10:00:00
  Filled 2017-06-12: qty 1

## 2017-06-12 MED ORDER — HEART ATTACK BOUNCING BOOK
Freq: Once | Status: DC
  Filled 2017-06-12: qty 1

## 2017-06-12 MED ORDER — ACTIVE PARTNERSHIP FOR HEALTH OF YOUR HEART BOOK
Freq: Once | Status: DC
  Filled 2017-06-12: qty 1

## 2017-06-12 MED ORDER — CLOPIDOGREL BISULFATE 75 MG PO TABS: 75.0000 mg | ORAL_TABLET | Freq: Every day | ORAL | Status: DC

## 2017-06-12 MED ORDER — RIVAROXABAN 20 MG PO TABS
20.0000 mg | ORAL_TABLET | Freq: Every day | ORAL | Status: DC
  Administered 2017-06-12: 12:00:00 20 mg via ORAL
  Filled 2017-06-12: qty 1

## 2017-06-12 MED ORDER — CLOPIDOGREL BISULFATE 75 MG PO TABS: 75.0000 mg | ORAL_TABLET | Freq: Every day | ORAL | 0 refills | Status: DC

## 2017-06-12 MED ORDER — HEPARIN (PORCINE) IN NACL 100-0.45 UNIT/ML-% IJ SOLN: 1000.0000 [IU]/h | INTRAMUSCULAR | Status: DC

## 2017-06-12 MED FILL — Heparin Sodium (Porcine) 2 Unit/ML in Sodium Chloride 0.9%: INTRAMUSCULAR | Qty: 1000 | Status: AC

## 2017-06-12 MED FILL — Lidocaine HCl Local Inj 1%: INTRAMUSCULAR | Qty: 20 | Status: AC

## 2017-06-12 NOTE — Progress Notes (Signed)
CARDIAC REHAB PHASE I   PRE:  Rate/Rhythm: 61 Sr  BP:  Supine: 124/57  Sitting:    Standing:     SaO2: 98 RA  MODE:  Ambulation: 1000 ft   POST:  Rate/Rhythm: 80 SR  BP:  Supine:   Sitting: 99/62  Standing:    SaO2: 99 RA 0800-0900  Assisted X 1 to ambulate. Gait steady. Pt tolerated ambulation well without c/o of cp or SOB. VS stable. Pt to side of bed after walk with call light in reach and wife present. Completed stent discharge education with pt and wife. Discussed stent , Plavix, exercise guidelines, heart healthy diet, use of sl NTG, call ing 911 and Outpt. CPR. Will send referral to [facility]. CRP per pt's request.  Rodney Langton RN 06/12/2017 9:02 AM

## 2017-06-12 NOTE — Plan of Care (Signed)
  Education: Knowledge of General Education information will improve 06/12/2017 0437 - Progressing by Tish Frederickson, [place] [facility]/Discharge Planning: Ability to manage health-related needs will improve 06/12/2017 0437 - Progressing by Tish Frederickson, RN   Clinical Measurements: Ability to maintain clinical measurements within normal limits will improve 06/12/2017 0437 - Progressing by Tish Frederickson, RN Will remain free from infection 06/12/2017 2411 - Progressing by Tish Frederickson, RN Diagnostic test results will improve 06/12/2017 0437 - Progressing by Tish Frederickson, RN Respiratory complications will improve 06/12/2017 0437 - Completed/Met by Tish Frederickson, RN Cardiovascular complication will be avoided 06/12/2017 0437 - Progressing by Tish Frederickson, RN

## 2017-06-12 NOTE — Discharge Instructions (Addendum)
Post Cath Instructions: No driving for 2 days. No lifting over 5 lbs for 1 week. No sexual activity for 1 week. Keep procedure site clean & dry. If you notice increased pain, swelling, bleeding or pus, call/return!  You may shower, but no soaking baths/pools for 1 week.   Information on my medicine - XARELTO (Rivaroxaban)  Why was Xarelto prescribed for you? Xarelto was prescribed for you to reduce the risk of a blood clot forming that can cause a stroke if you have a medical condition called atrial fibrillation (a type of irregular heartbeat).  What do you need to know about xarelto ? Take your Xarelto ONCE DAILY at the same time every day with your evening meal. If you have difficulty swallowing the tablet whole, you may crush it and mix in applesauce just prior to taking your dose.  Take Xarelto exactly as prescribed by your doctor and DO NOT stop taking Xarelto without talking to the doctor who prescribed the medication.  Stopping without other stroke prevention medication to take the place of Xarelto may increase your risk of developing a clot that causes a stroke.  Refill your prescription before you run out.  After discharge, you should have regular check-up appointments with your healthcare provider that is prescribing your Xarelto.  In the future your dose may need to be changed if your kidney function or weight changes by a significant amount.  What do you do if you miss a dose? If you are taking Xarelto ONCE DAILY and you miss a dose, take it as soon as you remember on the same day then continue your regularly scheduled once daily regimen the next day. Do not take two doses of Xarelto at the same time or on the same day.   Important Safety Information A possible side effect of Xarelto is bleeding. You should call your healthcare provider right away if you experience any of the following: ? Bleeding from an injury or your nose that does not stop. ? Unusual colored urine  (red or dark brown) or unusual colored stools (red or black). ? Unusual bruising for unknown reasons. ? A serious fall or if you hit your head (even if there is no bleeding).  Some medicines may interact with Xarelto and might increase your risk of bleeding while on Xarelto. To help avoid this, consult your healthcare provider or pharmacist prior to using any new prescription or non-prescription medications, including herbals, vitamins, non-steroidal anti-inflammatory drugs (NSAIDs) and supplements.  This website has more information on Xarelto: [email or web address]

## 2017-06-12 NOTE — Discharge Summary (Signed)
Physician Discharge Summary  Jose Warner MCN:[id number] DOB: [birth year] DOA: 06/09/2017  PCP: Derinda Late, MD  Admit date: 06/09/2017 Discharge date: 06/12/2017  Time spent: 30 minutes  Recommendations for Outpatient Follow-up:  1. Cardiology f/u 06/2017    Discharge Diagnoses:  Principal Problem:   CAD (coronary artery disease) Active Problems:   Low HDL (under 40)   Paroxysmal atrial fibrillation (HCC)   Chest pain   GERD (gastroesophageal reflux disease)   Chest pain due to CAD   Discharge Condition: stable  Diet recommendation: heart healthy/DM  Filed Weights   06/10/17 0005 06/11/17 0455 06/12/17 0444  Weight: 77.2 kg (170 lb 1.6 oz) 74.9 kg (165 lb 2 oz) 75.7 kg (166 lb 14.2 oz)    History of present illness:  Jose Warner is a 67 y.o. male with medical history significant of GERD, prostate cancer (s/p of radical prostatectomy), anemia, hepatitis, diverticulosis, celiac disease, PAF onXarelto, CAD, who presents with chest pain.  Patient has been having intermittent chest pain in substernal area for 3 days, which has worsened since4:00p.m. His chest is located in the substernal area, 7 out of 10 in severity initially, currently 0 out of 10, intermittent, pressure-like pain, nonradiating. It is associated with mild SOB, which has resolved currently. Patient does not have cough, fever or chills. No tenderness in the calvarium. Patient does not have nausea, vomiting, diarrhea, abdominal pain, symptoms of UTI or unilateral weakness. Ppatient had cardioversion for newly onset A fiblast week, and wasstarted with Xarelto.    Hospital Course:  Patient brought in for chest pain evaluation.  On IV heparin.  Cardiology consulted.  Cath performed.  Patient had cardiac cath with stent placement.  Initially started on Brilinta but transition to Plavix.  Discharged on Plavix, Xarelto and aspirin.  Patient cleared for discharge home with cardiac  follow-up.  Procedures:  Heart Cath  Consultations:  Cardiology  Discharge Exam: Vitals:   06/12/17 0730 06/12/17 0930  BP: (!) 124/57 (!) 116/54  Pulse: (!) 58 (!) 58  Resp: 11 (!) 22  Temp:    SpO2: 97% 98%    General: NAD, NCAT Cardiovascular: Brady, RR, no MRG Respiratory: CTAB, nl wob  Discharge Instructions   Discharge Instructions    Amb Referral to Cardiac Rehabilitation   Complete by:  As directed    Diagnosis:   Coronary Stents PTCA     Call MD for:  difficulty breathing, headache or visual disturbances   Complete by:  As directed    Call MD for:  extreme fatigue   Complete by:  As directed    Call MD for:  persistant dizziness or light-headedness   Complete by:  As directed    Call MD for:  persistant nausea and vomiting   Complete by:  As directed    Call MD for:  redness, tenderness, or signs of infection (pain, swelling, redness, odor or green/yellow discharge around incision site)   Complete by:  As directed    Call MD for:  temperature >100.4   Complete by:  As directed    Diet - low sodium heart healthy   Complete by:  As directed    Discharge instructions   Complete by:  As directed    Keep March appointment with Dr. Claiborne Billings (Cardiology).   Increase activity slowly   Complete by:  As directed      Allergies as of 06/12/2017      Reactions   Gluten Meal Other (See Comments)   celiac  Wheat Bran Other (See Comments)   Celiac   Sulfa Antibiotics Rash      Medication List    TAKE these medications   aspirin 81 MG chewable tablet Chew 81 mg by mouth daily.   bisoprolol 10 MG tablet Commonly known as:  ZEBETA Take 10 mg by mouth daily.   budesonide-formoterol 160-4.5 MCG/ACT inhaler Commonly known as:  SYMBICORT Inhale 2 puffs into the lungs 2 (two) times daily.   clopidogrel 75 MG tablet Commonly known as:  PLAVIX Take 1 tablet (75 mg total) by mouth daily for 21 days. Start taking on:  06/13/2017   guaiFENesin 600 MG 12 hr  tablet Commonly known as:  MUCINEX Take 600 mg by mouth 2 (two) times daily as needed.   MULTIVITAMIN PO Take 1 tablet by mouth daily.   niacin 1000 MG CR tablet Commonly known as:  NIASPAN Take 1 tablet by mouth at bedtime.   omeprazole 20 MG capsule Commonly known as:  PRILOSEC Take 20 mg by mouth at bedtime.   PROAIR HFA 108 (90 Base) MCG/ACT inhaler Generic drug:  albuterol Take 2 puffs by mouth 2 (two) times daily as needed. For shortness of breath.   rivaroxaban 20 MG Tabs tablet Commonly known as:  XARELTO Take 1 tablet (20 mg total) by mouth daily with supper.   simvastatin 40 MG tablet Commonly known as:  ZOCOR Take 1 tablet by mouth daily.   Vitamin D (Ergocalciferol) 2000 units Caps Take 1 capsule by mouth daily.      Allergies  Allergen Reactions  . Gluten Meal Other (See Comments)    celiac  . Wheat Bran Other (See Comments)    Celiac   . Sulfa Antibiotics Rash   Follow-up Information    Troy Sine, MD Follow up.   Specialty:  Cardiology Why:  Keep follow-up as planned 07/02/17 at 8:40am.  Contact information: [address] [place] [address] [phone]            The results of significant diagnostics from this hospitalization (including imaging, microbiology, ancillary and laboratory) are listed below for reference.    Significant Diagnostic Studies: Dg Chest 2 View  Result Date: 06/09/2017 CLINICAL DATA:  Chest pressure since 4 p.m. today. EXAM: CHEST  2 VIEW COMPARISON:  June 02, 2017 FINDINGS: The heart size and mediastinal contours are within normal limits. There is no focal infiltrate, pulmonary edema, or pleural effusion. There is chronic elevation of the right hemidiaphragm. The visualized skeletal structures are unremarkable. IMPRESSION: No active cardiopulmonary disease. Electronically Signed   By: Abelardo Diesel M.D.   On: 06/09/2017 20:22   Dg Chest Port 1 View  Result Date: 06/02/2017 CLINICAL DATA:   Heart palpitations EXAM: PORTABLE CHEST 1 VIEW COMPARISON:  Chest radiograph 04/24/2016 FINDINGS: The heart size and mediastinal contours are within normal limits. Both lungs are clear. The visualized skeletal structures are unremarkable. IMPRESSION: No active disease. Electronically Signed   By: Ulyses Jarred M.D.   On: 06/02/2017 21:51    Microbiology: Recent Results (from the past 240 hour(s))  MRSA PCR Screening     Status: None   Collection Time: 06/10/17  4:11 PM  Result Value Ref Range Status   MRSA by PCR NEGATIVE NEGATIVE Final    Comment:        The GeneXpert MRSA Assay (FDA approved for NASAL specimens only), is one component of a comprehensive MRSA colonization surveillance program. It is not intended to diagnose MRSA infection  nor to guide or monitor treatment for MRSA infections. Performed at [facility], [place] [address], [place], [place] [address]      Labs: Basic Metabolic Panel: Recent Labs  Lab 06/09/17 1926 06/11/17 0237 06/12/17 0549  NA 136 137 139  K 3.5 3.9 4.0  CL 102 107 109  CO2 23 22 18*  GLUCOSE 80 104* 101*  BUN 12 15 10   CREATININE 1.01 0.95 0.92  CALCIUM 8.8* 8.6* 8.6*   Liver Function Tests: Recent Labs  Lab 06/12/17 0804  AST 45*  ALT 26  ALKPHOS 43  BILITOT 0.7  PROT 5.7*  ALBUMIN 3.4*   No results for input(s): LIPASE, AMYLASE in the last 168 hours. No results for input(s): AMMONIA in the last 168 hours. CBC: Recent Labs  Lab 06/09/17 1926 06/11/17 0237 06/12/17 0549  WBC 6.8 5.9 5.9  HGB 14.6 13.7 13.8  HCT 44.2 42.5 40.6  MCV 87.9 88.5 88.3  PLT 190 172 162   Cardiac Enzymes: Recent Labs  Lab 06/09/17 1926 06/10/17 0911 06/10/17 1533 06/10/17 2126  TROPONINI <0.03 <0.03 <0.03 <0.03   BNP: BNP (last 3 results) No results for input(s): BNP in the last 8760 hours.  ProBNP (last 3 results) No results for input(s): PROBNP in the last 8760 hours.  CBG: No results for input(s): GLUCAP in the last 168  hours.     Signed:  Elwin Mocha MD  FACP  [facility] 06/12/2017, 11:36 AM

## 2017-06-12 NOTE — Progress Notes (Signed)
ANTICOAGULATION CONSULT NOTE   Pharmacy Consult for heparin Indication: chest pain/ACS / Afib  Allergies  Allergen Reactions  . Gluten Meal Other (See Comments)    celiac  . Wheat Bran Other (See Comments)    Celiac   . Sulfa Antibiotics Rash    Patient Measurements: Height: 5\' 7"  (170.2 cm) Weight: 166 lb 14.2 oz (75.7 kg) IBW/kg (Calculated) : 66.1   Vital Signs: Temp: 98.2 F (36.8 C) (02/22 0728) Temp Source: Oral (02/22 0728) BP: 124/57 (02/22 0728) Pulse Rate: 63 (02/22 0728)  Labs: Recent Labs    06/09/17 1926 06/10/17 0911 06/10/17 1342 06/10/17 1344 06/10/17 1533 06/10/17 2126 06/11/17 0237 06/12/17 0549  HGB 14.6  --   --   --   --   --  13.7 13.8  HCT 44.2  --   --   --   --   --  42.5 40.6  PLT 190  --   --   --   --   --  172 162  APTT  --   --  32  --   --   --  67* 72*  LABPROT  --   --   --   --   --   --  14.9  --   INR  --   --   --   --   --   --  1.18  --   HEPARINUNFRC  --   --   --  0.99*  --   --  0.65 0.43  CREATININE 1.01  --   --   --   --   --  0.95 0.92  TROPONINI <0.03 <0.03  --   --  <0.03 <0.03  --   --     Estimated Creatinine Clearance: 74.8 mL/min (by C-G formula based on SCr of 0.92 mg/dL).  Assessment: 67 yo male admitted with chest pain, h/o Afib s/p DCCV last week and was on Xarelto. Started on heparin here pre-cath and now to be resumed after sheath removal given recent DCCV. Patient also to transition from ticagrelor to Plavix tomorrow.  Therapeutic on heparin at 1000 units/hr   Transitioning back to Xarelto this afternoon after load of Plavix this AM  Goal of Therapy:  Heparin level 0.3-0.7 units/ml aPTT 66-102 seconds Monitor platelets by anticoagulation protocol: Yes   Plan:  Heparin at 1000 units / hr Transitioning to Xarelto this afternoon  Thank you Anette Guarneri, PharmD [phone] 06/12/2017 9:09 AM

## 2017-06-12 NOTE — Progress Notes (Addendum)
Progress Note  Patient Name: Jose Warner Date of Encounter: 06/12/2017  Primary Cardiologist: Dr. Claiborne Billings  Subjective   Feeling great this AM. No post cath complications. No CP or SOB.  Inpatient Medications    Scheduled Meds: . angioplasty book   Does not apply Once  . aspirin  81 mg Oral Daily  . atorvastatin  80 mg Oral q1800  . bisoprolol  5 mg Oral Daily  . cholecalciferol  2,000 Units Oral Daily  . clopidogrel  300 mg Oral Once  . heart attack bouncing book   Does not apply Once  . mometasone-formoterol  2 puff Inhalation BID  . multivitamin with minerals  1 tablet Oral Daily  . niacin  1,000 mg Oral Daily  . pantoprazole  40 mg Oral Daily  . sodium chloride flush  3 mL Intravenous Q12H   Continuous Infusions: . sodium chloride    . heparin 1,000 Units/hr (06/11/17 2039)   PRN Meds: sodium chloride, acetaminophen, albuterol, ALPRAZolam, nitroGLYCERIN, ondansetron (ZOFRAN) IV, oxyCODONE, sodium chloride flush, zolpidem   Vital Signs    Vitals:   06/11/17 1600 06/11/17 1700 06/11/17 2235 06/12/17 0444  BP: (!) 114/58 (!) 103/52 132/73 107/65  Pulse: 90 65 (!) 58 (!) 58  Resp: (!) 26 (!) 21 20 15   Temp:   97.6 F (36.4 C) (!) 97.4 F (36.3 C)  TempSrc:   Oral Oral  SpO2: 94% 98% 98% 98%  Weight:    166 lb 14.2 oz (75.7 kg)  Height:        Intake/Output Summary (Last 24 hours) at 06/12/2017 0729 Last data filed at 06/12/2017 0500 Gross per 24 hour  Intake 1788.66 ml  Output -  Net 1788.66 ml   Filed Weights   06/10/17 0005 06/11/17 0455 06/12/17 0444  Weight: 170 lb 1.6 oz (77.2 kg) 165 lb 2 oz (74.9 kg) 166 lb 14.2 oz (75.7 kg)    Telemetry    NSR, mild SB, occ PACs - Personally Reviewed  Physical Exam   GEN: No acute distress.  HEENT: Normocephalic, atraumatic, sclera non-icteric. Neck: No JVD or bruits. Cardiac: RRR no murmurs, rubs, or gallops.  Radials/DP/PT 1+ and equal bilaterally.  Respiratory: Clear to auscultation bilaterally.  Breathing is unlabored. GI: Soft, nontender, non-distended, BS +x 4. MS: no deformity. Extremities: No clubbing or cyanosis. No edema. Distal pedal pulses are 2+ and equal bilaterally. Right radial cath site without hematoma or ecchymosis; good pulse. Neuro:  AAOx3. Follows commands. Psych:  Responds to questions appropriately with a normal affect.  Labs    Chemistry Recent Labs  Lab 06/09/17 1926 06/11/17 0237  NA 136 137  K 3.5 3.9  CL 102 107  CO2 23 22  GLUCOSE 80 104*  BUN 12 15  CREATININE 1.01 0.95  CALCIUM 8.8* 8.6*  GFRNONAA >60 >60  GFRAA >60 >60  ANIONGAP 11 8     Hematology Recent Labs  Lab 06/09/17 1926 06/11/17 0237  WBC 6.8 5.9  RBC 5.03 4.80  HGB 14.6 13.7  HCT 44.2 42.5  MCV 87.9 88.5  MCH 29.0 28.5  MCHC 33.0 32.2  RDW 14.0 14.0  PLT 190 172    Cardiac Enzymes Recent Labs  Lab 06/09/17 1926 06/10/17 0911 06/10/17 1533 06/10/17 2126  TROPONINI <0.03 <0.03 <0.03 <0.03   No results for input(s): TROPIPOC in the last 168 hours.   Radiology    No results found.  Cardiac Studies   1. Cardiac catheterization this admission, please see  full report and below for summary.  2. Echo 06/10/17 Study Conclusions - Left ventricle: The cavity size was normal. Wall thickness was   normal. Systolic function was normal. The estimated ejection   fraction was in the range of 50% to 55%. Wall motion was normal;   there were no regional wall motion abnormalities. Doppler   parameters are consistent with abnormal left ventricular   relaxation (grade 1 diastolic dysfunction). - Aortic valve: Trileaflet; mildly thickened, mildly calcified   leaflets. There was mild regurgitation. - Mitral valve: There was trivial regurgitation. - Left atrium: The atrium was mildly dilated. Anterior-posterior   dimension: 43 mm.  Patient Profile     67 y.o. male with a hx of PAF, HLD, Celiac disease, GERD, prostate CA (s/p robotic prostatectomy - first episode of PAF  then), non-obstructive CAD 2006. He had developed recent PAF and was cardioverted in ER on 06/02/16. He was re-admitted with chest tightness worrisome for unstable angina. 2D Echo 06/10/17 - EF 50-55%, grade 1 DD, mild AI, mild LAE.   Assessment & Plan    1. Unstable angina/CAD - r/o for MI, cath with 95% mLAD s/p DES 06/11/17. Statin escalated. Cath note indicates ASA/Brilinta x 12 months but I see Brilinta was d/c last night and there is order from Dr. Claiborne Billings for Plavix load x 1 this AM. Will tentatively start 64m daily the day after and will clarify with MD.   2. PAF - initial dx 2014 with recurrence 2/12 s/p cardioversion (had presented with 2 weeks of CP, and onset of palpitations that evening). Started on Xarelto. CHADSVASC is 2, indicating that longterm anticoagulation needs to be considered. Will review plan for DAPT + Xarelto with Dr. KClaiborne Billings Consider OP sleep study. Reviewed observation for bleeding with patient.  3. Mixed HLD - simva discontinued and changed to atorvastatin yesterday. More recent data is poor with niacin to suggest any clinical benefit, consider discontinuation. If the patient is tolerating statin at time of follow-up appointment, would consider rechecking liver function/lipid panel in 6-8 weeks. Add on baseline LFTs today.  4. Pre-diabetes - A1C 6.1. Will need f/u PCP for this.  Keep f/u as planned 06/2017 per d/w Dr. KClaiborne Billings Added post cath instructions to AVS.  For questions or updates, please contact CSanfordPlease consult [email or web address] for contact info under Cardiology/STEMI.  Signed, DCharlie Pitter PA-C 06/12/2017, 7:29 AM    Patient seen and examined. Agree with assessment and plan. Angios reviewed; 95% focal mid LAD stenosis Rx with Synergy DES stent with excellent result.  Feels much better; no recurrent chest pain. With need for anticoagulation plan for initial triple drug therapy; resume xarelto today and change brilinta to plavix. Will give plavix 300 mg  today and then 75 mg daily.  R radial site stable. DC today; f/u with me in office 07/02/17.   TTroy Sine MD, FBay Microsurgical Unit2/22/2019 8:15 AM

## 2017-06-14 ENCOUNTER — Encounter (HOSPITAL_BASED_OUTPATIENT_CLINIC_OR_DEPARTMENT_OTHER): Payer: Self-pay | Admitting: Emergency Medicine

## 2017-06-14 ENCOUNTER — Telehealth: Payer: Self-pay | Admitting: Physician Assistant

## 2017-06-14 ENCOUNTER — Observation Stay (HOSPITAL_BASED_OUTPATIENT_CLINIC_OR_DEPARTMENT_OTHER)
Admission: EM | Admit: 2017-06-14 | Discharge: 2017-06-15 | Disposition: A | Discharge status: Home / Self Care | Payer: Medicare Other | Attending: Internal Medicine | Admitting: Internal Medicine

## 2017-06-14 ENCOUNTER — Emergency Department (HOSPITAL_BASED_OUTPATIENT_CLINIC_OR_DEPARTMENT_OTHER): Payer: Medicare Other

## 2017-06-14 ENCOUNTER — Other Ambulatory Visit: Payer: Self-pay

## 2017-06-14 DIAGNOSIS — Z7982 Long term (current) use of aspirin: Secondary | ICD-10-CM | POA: Diagnosis not present

## 2017-06-14 DIAGNOSIS — Z955 Presence of coronary angioplasty implant and graft: Secondary | ICD-10-CM | POA: Insufficient documentation

## 2017-06-14 DIAGNOSIS — K219 Gastro-esophageal reflux disease without esophagitis: Secondary | ICD-10-CM | POA: Diagnosis not present

## 2017-06-14 DIAGNOSIS — Z79899 Other long term (current) drug therapy: Secondary | ICD-10-CM | POA: Diagnosis not present

## 2017-06-14 DIAGNOSIS — Z8546 Personal history of malignant neoplasm of prostate: Secondary | ICD-10-CM | POA: Diagnosis not present

## 2017-06-14 DIAGNOSIS — I249 Acute ischemic heart disease, unspecified: Secondary | ICD-10-CM

## 2017-06-14 DIAGNOSIS — E785 Hyperlipidemia, unspecified: Secondary | ICD-10-CM | POA: Diagnosis not present

## 2017-06-14 DIAGNOSIS — I4891 Unspecified atrial fibrillation: Secondary | ICD-10-CM | POA: Insufficient documentation

## 2017-06-14 DIAGNOSIS — R0789 Other chest pain: Secondary | ICD-10-CM | POA: Insufficient documentation

## 2017-06-14 DIAGNOSIS — Z7902 Long term (current) use of antithrombotics/antiplatelets: Secondary | ICD-10-CM | POA: Insufficient documentation

## 2017-06-14 DIAGNOSIS — R079 Chest pain, unspecified: Secondary | ICD-10-CM | POA: Diagnosis not present

## 2017-06-14 DIAGNOSIS — D649 Anemia, unspecified: Secondary | ICD-10-CM | POA: Diagnosis not present

## 2017-06-14 DIAGNOSIS — Z882 Allergy status to sulfonamides status: Secondary | ICD-10-CM | POA: Insufficient documentation

## 2017-06-14 DIAGNOSIS — I2510 Atherosclerotic heart disease of native coronary artery without angina pectoris: Principal | ICD-10-CM | POA: Insufficient documentation

## 2017-06-14 HISTORY — DX: Atherosclerotic heart disease of native coronary artery without angina pectoris: I25.10

## 2017-06-14 LAB — BASIC METABOLIC PANEL
ANION GAP: 9 (ref 5–15)
BUN: 12 mg/dL (ref 6–20)
CO2: 22 mmol/L (ref 22–32)
Calcium: 9.2 mg/dL (ref 8.9–10.3)
Chloride: 104 mmol/L (ref 101–111)
Creatinine, Ser: 1.02 mg/dL (ref 0.61–1.24)
GFR calc Af Amer: >60 mL/min (ref >60)
Glucose, Bld: 77 mg/dL (ref 65–99)
POTASSIUM: 4.4 mmol/L (ref 3.5–5.1)
Sodium: 135 mmol/L (ref 135–145)

## 2017-06-14 LAB — CBC
HEMATOCRIT: 44.7 % (ref 39.0–52.0)
HEMOGLOBIN: 14.8 g/dL (ref 13.0–17.0)
MCH: 28.8 pg (ref 26.0–34.0)
MCHC: 33.1 g/dL (ref 30.0–36.0)
MCV: 87 fL (ref 78.0–100.0)
Platelets: 187 10*3/uL (ref 150–400)
RBC: 5.14 MIL/uL (ref 4.22–5.81)
RDW: 14.1 % (ref 11.5–15.5)
WBC: 6.8 10*3/uL (ref 4.0–10.5)

## 2017-06-14 LAB — APTT: aPTT: 33 seconds (ref 24–36)

## 2017-06-14 LAB — TROPONIN I: Troponin I: <0.03 ng/mL (ref <0.03)

## 2017-06-14 LAB — HEPARIN LEVEL (UNFRACTIONATED): Heparin Unfractionated: 0.8 IU/mL — ABNORMAL HIGH (ref 0.30–0.70)

## 2017-06-14 MED ORDER — BISOPROLOL FUMARATE 5 MG PO TABS: 10.0000 mg | ORAL_TABLET | Freq: Every day | ORAL | Status: DC

## 2017-06-14 MED ORDER — FAMOTIDINE 20 MG PO TABS
10.0000 mg | ORAL_TABLET | Freq: Two times a day (BID) | ORAL | Status: DC
  Administered 2017-06-15 (×2): 10 mg via ORAL
  Filled 2017-06-14 (×2): qty 1

## 2017-06-14 MED ORDER — BISOPROLOL FUMARATE 5 MG PO TABS
5.0000 mg | ORAL_TABLET | Freq: Every day | ORAL | Status: DC
  Administered 2017-06-15: 5 mg via ORAL
  Filled 2017-06-14: qty 1

## 2017-06-14 MED ORDER — NITROGLYCERIN 0.4 MG SL SUBL: 0.4000 mg | SUBLINGUAL_TABLET | SUBLINGUAL | Status: DC | PRN

## 2017-06-14 MED ORDER — ACETAMINOPHEN 325 MG PO TABS
650.0000 mg | ORAL_TABLET | ORAL | Status: DC | PRN
Start: 2017-06-14 — End: 2017-06-15

## 2017-06-14 MED ORDER — NIACIN ER (ANTIHYPERLIPIDEMIC) 500 MG PO TBCR: 1000.0000 mg | EXTENDED_RELEASE_TABLET | Freq: Every day | ORAL | Status: DC

## 2017-06-14 MED ORDER — HEPARIN (PORCINE) IN NACL 100-0.45 UNIT/ML-% IJ SOLN
1000.0000 [IU]/h | INTRAMUSCULAR | Status: DC
  Administered 2017-06-14: 1000 [IU]/h via INTRAVENOUS
  Filled 2017-06-14: qty 250

## 2017-06-14 MED ORDER — CLOPIDOGREL BISULFATE 75 MG PO TABS
75.0000 mg | ORAL_TABLET | Freq: Every day | ORAL | Status: DC
  Administered 2017-06-15: 75 mg via ORAL
  Filled 2017-06-14: qty 1

## 2017-06-14 MED ORDER — NITROGLYCERIN IN D5W 200-5 MCG/ML-% IV SOLN
0.0000 ug/min | Freq: Once | INTRAVENOUS | Status: AC
  Administered 2017-06-14: 6 ug/min via INTRAVENOUS
  Filled 2017-06-14: qty 250

## 2017-06-14 MED ORDER — ALBUTEROL SULFATE (2.5 MG/3ML) 0.083% IN NEBU
3.0000 mL | INHALATION_SOLUTION | Freq: Four times a day (QID) | RESPIRATORY_TRACT | Status: DC
  Administered 2017-06-15: 3 mL via RESPIRATORY_TRACT
  Filled 2017-06-14: qty 3

## 2017-06-14 MED ORDER — GI COCKTAIL ~~LOC~~
30.0000 mL | Freq: Once | ORAL | Status: AC
  Administered 2017-06-15: 30 mL via ORAL
  Filled 2017-06-14: qty 30

## 2017-06-14 MED ORDER — MORPHINE SULFATE (PF) 4 MG/ML IV SOLN
4.0000 mg | Freq: Once | INTRAVENOUS | Status: AC
  Administered 2017-06-14: 4 mg via INTRAVENOUS

## 2017-06-14 MED ORDER — MORPHINE SULFATE (PF) 4 MG/ML IV SOLN
4.0000 mg | Freq: Once | INTRAVENOUS | Status: AC
  Administered 2017-06-14: 4 mg via INTRAVENOUS
  Filled 2017-06-14: qty 1

## 2017-06-14 MED ORDER — ASPIRIN EC 81 MG PO TBEC
81.0000 mg | DELAYED_RELEASE_TABLET | Freq: Every day | ORAL | Status: DC
  Administered 2017-06-15: 81 mg via ORAL
  Filled 2017-06-14: qty 1

## 2017-06-14 MED ORDER — PANTOPRAZOLE SODIUM 40 MG PO TBEC
40.0000 mg | DELAYED_RELEASE_TABLET | Freq: Every day | ORAL | Status: DC
  Administered 2017-06-15: 40 mg via ORAL
  Filled 2017-06-14: qty 1

## 2017-06-14 MED ORDER — ASPIRIN 81 MG PO CHEW: 81.0000 mg | CHEWABLE_TABLET | Freq: Every day | ORAL | Status: DC

## 2017-06-14 MED ORDER — NITROGLYCERIN 2 % TD OINT
1.0000 [in_us] | TOPICAL_OINTMENT | Freq: Four times a day (QID) | TRANSDERMAL | Status: DC
  Administered 2017-06-14: 1 [in_us] via TOPICAL
  Filled 2017-06-14: qty 1

## 2017-06-14 MED ORDER — MOMETASONE FURO-FORMOTEROL FUM 200-5 MCG/ACT IN AERO
2.0000 | INHALATION_SPRAY | Freq: Two times a day (BID) | RESPIRATORY_TRACT | Status: DC
  Filled 2017-06-14: qty 8.8

## 2017-06-14 MED ORDER — MORPHINE SULFATE (PF) 4 MG/ML IV SOLN
INTRAVENOUS | Status: AC
  Filled 2017-06-14: qty 1

## 2017-06-14 MED ORDER — SIMVASTATIN 40 MG PO TABS
40.0000 mg | ORAL_TABLET | Freq: Every day | ORAL | Status: DC
  Filled 2017-06-14: qty 1

## 2017-06-14 MED ORDER — ONDANSETRON HCL 4 MG/2ML IJ SOLN: 4.0000 mg | Freq: Four times a day (QID) | INTRAMUSCULAR | Status: DC | PRN

## 2017-06-14 NOTE — ED Triage Notes (Addendum)
Chest pressure and SOB since this morning. Pt had stent placed in LAD on Thursday. Pt is pale. Pt took 1 NTG at 13:05

## 2017-06-14 NOTE — ED Notes (Signed)
ED Provider at bedside. 

## 2017-06-14 NOTE — ED Provider Notes (Signed)
[place] EMERGENCY DEPARTMENT Provider Note   CSN: [id number] Arrival date & time: 06/14/17  1339     History   Chief Complaint Chief Complaint  Patient presents with  . Chest Pain    HPI Jose Warner is a 67 y.o. male.  HPI And had cardiac catheterization 2\21\19 95% LAD lesion treated with DES.  Patient has been taking Xarelto and Plavix.  Last dose of Xarelto was last night at 4 PM.  Patient took a.m. Plavix dose.  Patient was discharged Friday (day before yesterday).  Today he was sitting on the floor playing with his grandson.  He got up and felt "winded".  Shortly after he began to develop chest pain.  He reports the pain is pressure and tight in quality.  Has been waxing and waning in severity.  His daughter took his heart rate and blood pressure.  Heart rate was in the 50s and blood pressure 100/70.  Patient did take 1 nitroglycerin and got some improvement in pain.  Pain however has continued to wax and wane.  He reports it is not as severe as initial pain before stent but has been continuing to recur and is very uncomfortable. Past Medical History:  Diagnosis Date  . Anemia   . Atrial fibrillation ([place]) 06/05/2017  . Bronchitis 05-10-12   past fall- 4 runs antibiotics due to bronchitis-uses [employer] as needed  . Celiac disease   . Colon polyps    adenomatous  . Coronary artery disease   . Diverticulosis   . GERD (gastroesophageal reflux disease)   . Hepatitis 05-10-12   "was told non A, non B"-unclean dental equip.  Marland Kitchen Hyperlipidemia   . Iron deficiency anemia   . Prostate cancer ([place]) 05-10-12   ;bx. 04-12-12, dx  . Spasm of esophagus 2013    Patient Active Problem List   Diagnosis Date Noted  . ACS (acute coronary syndrome) ([place]) 06/14/2017  . GERD (gastroesophageal reflux disease) 06/10/2017  . Chest pain due to CAD 06/10/2017  . Chest pain 06/09/2017  . 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 FHx: coronary artery  disease 11/26/2012  . H/O prostate cancer 11/26/2012  . Low HDL (under 40) 11/26/2012  . Paroxysmal atrial fibrillation ([place]) 11/26/2012  . CAD (coronary artery disease) 11/26/2012    Past Surgical History:  Procedure Laterality Date  . CARDIAC CATHETERIZATION  09/13/2004   LAD:30%-40% in prox. to mid segment with 20% in the mid segment and 20% in left Circ., mild 20% right common iliac narrowing. medical therapy  . CARDIOVERSION  06/05/2017  . CORONARY STENT INTERVENTION N/A 06/11/2017   Procedure: CORONARY STENT INTERVENTION;  Surgeon: Belva Crome, MD;  Location: [facility] CV LAB;  Service: Cardiovascular;  Laterality: N/A;  . HERNIA REPAIR  5 yrs ago   right   . LEFT HEART CATH AND CORONARY ANGIOGRAPHY N/A 06/11/2017   Procedure: LEFT HEART CATH AND CORONARY ANGIOGRAPHY;  Surgeon: Belva Crome, MD;  Location: [facility];  Service: Cardiovascular;  Laterality: N/A;  . NM MYOCAR PERF WALL MOTION  11/30/2007   protocol:Bruce, post EF 67%,mild perfusion defect seen in basal inferior consistant with Attenuation artifact, exercise cap. 12METS  . ROBOT ASSISTED LAPAROSCOPIC RADICAL PROSTATECTOMY  05/20/2012   Procedure: ROBOTIC ASSISTED LAPAROSCOPIC RADICAL PROSTATECTOMY LEVEL 1;  Surgeon: Dutch Gray, MD;  Location: [facility];  Service: Urology;  Laterality: N/A;     . TRANSURETHRAL RESECTION OF PROSTATE  25yr ago  Home Medications    Prior to Admission medications   Medication Sig Start Date End Date Taking? Authorizing Provider  aspirin 81 MG chewable tablet Chew 81 mg by mouth daily.    [provider]  bisoprolol (ZEBETA) 10 MG tablet Take 10 mg by mouth daily.    [provider]  budesonide-formoterol (SYMBICORT) 160-4.5 MCG/ACT inhaler Inhale 2 puffs into the lungs 2 (two) times daily.    [provider]  clopidogrel (PLAVIX) 75 MG tablet Take 1 tablet (75 mg total) by mouth daily for 21 days. 06/13/17 07/04/17  Elwin Mocha, MD    guaiFENesin (MUCINEX) 600 MG 12 hr tablet Take 600 mg by mouth 2 (two) times daily as needed.    [provider]  Multiple Vitamins-Minerals (MULTIVITAMIN PO) Take 1 tablet by mouth daily.    [provider]  niacin (NIASPAN) 1000 MG CR tablet Take 1 tablet by mouth at bedtime.  12/04/15   [provider]  omeprazole (PRILOSEC) 20 MG capsule Take 20 mg by mouth at bedtime.     [provider]  PROAIR HFA 108 (90 BASE) MCG/ACT inhaler Take 2 puffs by mouth 2 (two) times daily as needed. For shortness of breath. 02/13/12   [provider]  rivaroxaban (XARELTO) 20 MG TABS tablet Take 1 tablet (20 mg total) by mouth daily with supper. 06/05/17   Sherran Needs, NP  simvastatin (ZOCOR) 40 MG tablet Take 1 tablet by mouth daily. 11/02/15   [provider]  Vitamin D, Ergocalciferol, 2000 units CAPS Take 1 capsule by mouth daily.    [provider]    Family History Family History  Problem Relation Age of Onset  . Stroke Mother   . Heart disease Father   . Heart attack Father   . Cancer Sister   . Stroke Maternal Grandfather   . Cancer Paternal Grandmother   . Heart attack Paternal Grandfather   . Heart disease Paternal Grandfather   . Colon cancer Neg Hx   . Esophageal cancer Neg Hx   . Pancreatic cancer Neg Hx   . Rectal cancer Neg Hx   . Stomach cancer Neg Hx     Social History Social History   Tobacco Use  . Smoking status: Never Smoker  . Smokeless tobacco: Never Used  Substance Use Topics  . Alcohol use: Yes    Alcohol/week: 1.2 oz    Types: 2 Glasses of wine per week    Comment: occ. x 2 drinks weekly  . Drug use: No     Allergies   Gluten meal; Wheat bran; and Sulfa antibiotics   Review of Systems Review of Systems 10 Systems reviewed and are negative for acute change except as noted in the HPI.   Physical Exam Updated Vital Signs BP (!) 149/80 (BP Location: Left Arm)   Pulse (!) 52   Temp 97.9  F (36.6 C) (Oral)   Resp (!) 22   Ht 5' 7"  (1.702 m)   Wt 75.3 kg (166 lb)   SpO2 100%   BMI 26.00 kg/m   Physical Exam  Constitutional: He is oriented to person, place, and time. He appears well-developed and well-nourished.  Patient appears uncomfortable.  He is alert and appropriate.  No respiratory distress at rest.  Color good.  HENT:  Head: Normocephalic and atraumatic.  Mouth/Throat: Oropharynx is clear and moist.  Eyes: EOM are normal.  Cardiovascular:  Bradycardia 50s.  No gross rub murmur gallop.  Pulmonary/Chest:  Effort normal and breath sounds normal.  Abdominal: Soft. He exhibits no distension. There is no tenderness. There is no guarding.  Musculoskeletal: Normal range of motion.  Patient had catheter site in right wrist.  Very mild bruising but no large swelling or edema.  No lower extremity edema.  Calves are soft and nontender.  Neurological: He is alert and oriented to person, place, and time. No cranial nerve deficit. He exhibits normal muscle tone. Coordination normal.  Skin: Skin is warm and dry.  Psychiatric: He has a normal mood and affect.  Anxious.     ED Treatments / Results  Labs (all labs ordered are listed, but only abnormal results are displayed) Labs Reviewed  BASIC METABOLIC PANEL  CBC  TROPONIN I    EKG  EKG Interpretation  Date/Time:  Sunday June 14 2017 13:41:43 EST Ventricular Rate:  51 PR Interval:  172 QRS Duration: 86 QT Interval:  438 QTC Calculation: 403 R Axis:   44 Text Interpretation:  Sinus bradycardia Otherwise normal ECG no STEMI, no sig change from previous Confirmed by Charlesetta Shanks [phone]) on 06/14/2017 2:40:41 PM       Radiology Dg Chest Port 1 View  Result Date: 06/14/2017 CLINICAL DATA:  Chest pain. Recent heart catheterization and stent placement. EXAM: PORTABLE CHEST 1 VIEW COMPARISON:  Two-view chest x-ray 06/09/2017 FINDINGS: The heart size is exaggerated by low lung volumes. There is no edema or  effusion. No focal airspace disease is present. The visualized soft tissues and bony thorax are unremarkable. IMPRESSION: 1. Low lung volumes. 2. No acute cardiopulmonary disease. Electronically Signed   By: San Morelle M.D.   On: 06/14/2017 14:35    Procedures Procedures (including critical care time) CRITICAL CARE Performed by: Si Gaul   Total critical care time: 30 minutes  Critical care time was exclusive of separately billable procedures and treating other patients.  Critical care was necessary to treat or prevent imminent or life-threatening deterioration.  Critical care was time spent personally by me on the following activities: development of treatment plan with patient and/or surrogate as well as nursing, discussions with consultants, evaluation of patient's response to treatment, examination of patient, obtaining history from patient or surrogate, ordering and performing treatments and interventions, ordering and review of laboratory studies, ordering and review of radiographic studies, pulse oximetry and re-evaluation of patient's condition. Medications Ordered in ED Medications  nitroGLYCERIN (NITROGLYN) 2 % ointment 1 inch (1 inch Topical Given 06/14/17 1429)  morphine 4 MG/ML injection 4 mg (4 mg Intravenous Given 06/14/17 1429)     Initial Impression / Assessment and Plan / ED Course  I have reviewed the triage vital signs and the nursing notes.  Pertinent labs & imaging results that were available during my care of the patient were reviewed by me and considered in my medical decision making (see chart for details).    Consult: Reviewed with Dr. Golden Hurter.  Reviewed patient's history of present illness and current medications.  Advises to initiate heparin, okay that patient has had Xarelto yesterday evening and Plavix this morning.  Except for transfer to [facility] to stepdown unit.  Final Clinical Impressions(s) / ED Diagnoses   Final diagnoses:    ACS (acute coronary syndrome) [facility])  Patient presents as outlined above.  He has recent LAD stent.  At this time, patient is alert and appropriate.  He has no respiratory distress.  2 EKGs do not show acute ischemic changes at this time.  We will continue treatment  and initiate heparin and administer morphine and nitroglycerin as tolerated for pain control.  Will transfer to [facility] for definitive management with cardiology service.  ED Discharge Orders    None       Charlesetta Shanks, MD 06/14/17 [phone]

## 2017-06-14 NOTE — Progress Notes (Signed)
ANTICOAGULATION CONSULT NOTE - Initial Consult  Pharmacy Consult:  Heparin Indication: chest pain/ACS  Allergies  Allergen Reactions  . Gluten Meal Other (See Comments)    celiac  . Wheat Bran Other (See Comments)    Celiac   . Sulfa Antibiotics Rash    Patient Measurements: Height: 5\' 7"  (170.2 cm) Weight: 166 lb (75.3 kg) IBW/kg (Calculated) : 66.1 Heparin Dosing Weight: 75 kg  Vital Signs: Temp: 97.9 F (36.6 C) (02/24 1346) Temp Source: Oral (02/24 1346) BP: 149/80 (02/24 1408) Pulse Rate: 52 (02/24 1408)  Labs: Recent Labs    06/12/17 0549 06/14/17 1355  HGB 13.8 14.8  HCT 40.6 44.7  PLT 162 187  APTT 72*  --   HEPARINUNFRC 0.43  --   CREATININE 0.92 1.02  TROPONINI  --  <0.03    Estimated Creatinine Clearance: 67.5 mL/min (by C-G formula based on SCr of 1.02 mg/dL).   Medical History: Past Medical History:  Diagnosis Date  . Anemia   . Atrial fibrillation ([place]) 06/05/2017  . Bronchitis 05-10-12   past fall- 4 runs antibiotics due to bronchitis-uses [employer] as needed  . Celiac disease   . Colon polyps    adenomatous  . Coronary artery disease   . Diverticulosis   . GERD (gastroesophageal reflux disease)   . Hepatitis 05-10-12   "was told non A, non B"-unclean dental equip.  Marland Kitchen Hyperlipidemia   . Iron deficiency anemia   . Prostate cancer ([place]) 05-10-12   ;bx. 04-12-12, dx  . Spasm of esophagus 2013     Assessment: 37 YOM discharged on 06/12/17 post cath with stent to LAD.  Patient has been on Xarelto for Afib s/p recent DCCV.  He presented to [facility] with chest pain and Pharmacy consulted to initiate IV heparin.  Per documentation, patient last took Xarelto on 06/13/17 at 1600.  Baseline labs reviewed.  He was previously therapeutic on heparin 1000 units/hr.   Goal of Therapy:  Heparin level 0.3-0.7 units/ml aPTT 66 - 102 seconds Monitor platelets by anticoagulation protocol: Yes    Plan:  Heparin gtt at 1000 units/hr, start at 1600 (24 hrs  after last Xarelto dose) Check 8 hr heparin level, aPTT since no bolus  Daily heparin level, aPTT, CBC   Elleni Mozingo D. Mina Marble, PharmD, BCPS Pager:  [phone] 06/14/2017, 2:57 PM

## 2017-06-14 NOTE — H&P (Signed)
History & Physical    Patient ID: Jose Warner MRN: [id number], DOB/AGE: [birth year]   Admit date: 06/14/2017   Primary Physician: Jose Late, MD Primary Cardiologist: Jose Majestic, MD  Patient Profile    CP - s/p PCI   Past Medical History    Past Medical History:  Diagnosis Date  . Anemia   . Atrial fibrillation ([place]) 06/05/2017  . Bronchitis 05-10-12   past fall- 4 runs antibiotics due to bronchitis-uses [employer] as needed  . Celiac disease   . Colon polyps    adenomatous  . Coronary artery disease   . Diverticulosis   . GERD (gastroesophageal reflux disease)   . Hepatitis 05-10-12   "was told non A, non B"-unclean dental equip.  Marland Kitchen Hyperlipidemia   . Iron deficiency anemia   . Prostate cancer ([place]) 05-10-12   ;bx. 04-12-12, dx  . Spasm of esophagus 2013    Past Surgical History:  Procedure Laterality Date  . CARDIAC CATHETERIZATION  09/13/2004   LAD:30%-40% in prox. to mid segment with 20% in the mid segment and 20% in left Circ., mild 20% right common iliac narrowing. medical therapy  . CARDIOVERSION  06/05/2017  . CORONARY STENT INTERVENTION N/A 06/11/2017   Procedure: CORONARY STENT INTERVENTION;  Surgeon: Jose Crome, MD;  Location: [facility];  Service: Cardiovascular;  Laterality: N/A;  . HERNIA REPAIR  5 yrs ago   right   . LEFT HEART CATH AND CORONARY ANGIOGRAPHY N/A 06/11/2017   Procedure: LEFT HEART CATH AND CORONARY ANGIOGRAPHY;  Surgeon: Jose Crome, MD;  Location: [facility];  Service: Cardiovascular;  Laterality: N/A;  . NM MYOCAR PERF WALL MOTION  11/30/2007   protocol:Bruce, post EF 67%,mild perfusion defect seen in basal inferior consistant with Attenuation artifact, exercise cap. 12METS  . ROBOT ASSISTED LAPAROSCOPIC RADICAL PROSTATECTOMY  05/20/2012   Procedure: ROBOTIC ASSISTED LAPAROSCOPIC RADICAL PROSTATECTOMY LEVEL 1;  Surgeon: Jose Gray, MD;  Location: [facility];  Service: Urology;  Laterality: N/A;     .  TRANSURETHRAL RESECTION OF PROSTATE  2yr ago     Allergies  Allergies  Allergen Reactions  . Gluten Meal Other (See Comments)    celiac  . Wheat Bran Other (See Comments)    Celiac   . Sulfa Antibiotics Rash    History of Present Illness    Mr Jose Warner CAD, with PCI to LAD on Thursday 2\21\19 (95% LAD lesion treated with DES). He reports chest pain that started at rest while being around family. No strenous activity. No change with position. Similar to the pain he had prior to PCI. Patient has been taking Xarelto and Plavix.  Last dose of Xarelto was last night at 4 PM. No missed doses. Patient took a.m. Plavix dose.  He had still pain upon presentation to the emergency room.  His vital signs have been where they usually around with a heart rate of around 50 and a blood pressure of around [id number].  Patient took a nitroglycerin tablet at home with relief of his pain.  In the emergency room he was started nitroglycerin which again improved his pain.  Ever since he was started on nitroglycerin he remained pain-free.  Upon transfer to [facility] he has no longer any pain.  His blood pressure has been soft in the low 100s.  His nitroglycerin drip was discontinued.  Patient remains pain-free.    Home Medications    Prior to Admission medications  Medication Sig Start Date End Date Taking? Authorizing Provider  aspirin 81 MG chewable tablet Chew 81 mg by mouth daily.    [provider]  bisoprolol (ZEBETA) 10 MG tablet Take 10 mg by mouth daily.    [provider]  budesonide-formoterol (SYMBICORT) 160-4.5 MCG/ACT inhaler Inhale 2 puffs into the lungs 2 (two) times daily.    [provider]  clopidogrel (PLAVIX) 75 MG tablet Take 1 tablet (75 mg total) by mouth daily for 21 days. 06/13/17 07/04/17  Jose Mocha, MD  guaiFENesin (MUCINEX) 600 MG 12 hr tablet Take 600 mg by mouth 2 (two) times daily as needed.    [provider]  Multiple  Vitamins-Minerals (MULTIVITAMIN PO) Take 1 tablet by mouth daily.    [provider]  niacin (NIASPAN) 1000 MG CR tablet Take 1 tablet by mouth at bedtime.  12/04/15   [provider]  omeprazole (PRILOSEC) 20 MG capsule Take 20 mg by mouth at bedtime.     [provider]  PROAIR HFA 108 (90 BASE) MCG/ACT inhaler Take 2 puffs by mouth 2 (two) times daily as needed. For shortness of breath. 02/13/12   [provider]  rivaroxaban (XARELTO) 20 MG TABS tablet Take 1 tablet (20 mg total) by mouth daily with supper. 06/05/17   Jose Needs, NP  simvastatin (ZOCOR) 40 MG tablet Take 1 tablet by mouth daily. 11/02/15   [provider]  Vitamin D, Ergocalciferol, 2000 units CAPS Take 1 capsule by mouth daily.    [provider]    Family History    Family History  Problem Relation Age of Onset  . Stroke Mother   . Heart disease Father   . Heart attack Father   . Cancer Sister   . Stroke Maternal Grandfather   . Cancer Paternal Grandmother   . Heart attack Paternal Grandfather   . Heart disease Paternal Grandfather   . Colon cancer Neg Hx   . Esophageal cancer Neg Hx   . Pancreatic cancer Neg Hx   . Rectal cancer Neg Hx   . Stomach cancer Neg Hx    indicated that his mother is deceased. He indicated that his father is deceased. He indicated that both of his sisters are deceased. He indicated that both of his brothers are alive. He indicated that his maternal grandfather is deceased. He indicated that his paternal grandmother is deceased. He indicated that his paternal grandfather is deceased. He indicated that the status of his neg hx is unknown.   Social History    Social History   Socioeconomic History  . Marital status: Married    Spouse name: Not on file  . Number of children: 3  . Years of education: Not on file  . Highest education level: Not on file  Social Warner  . Financial resource strain: Not on file  . Food insecurity  - worry: Patient refused  . Food insecurity - inability: Patient refused  . Transportation Warner - medical: Patient refused  . Transportation Warner - non-medical: Patient refused  Occupational History    Employer: [employer]  Tobacco Use  . Smoking status: Never Smoker  . Smokeless tobacco: Never Used  Substance and Sexual Activity  . Alcohol use: Yes    Alcohol/week: 1.2 oz    Types: 2 Glasses of wine per week    Comment: occ. x 2 drinks weekly  . Drug use: No  . Sexual activity: Yes    Partners: Female  Other Topics Concern  . Not on file  Social History Narrative  . Not on file     Review of Systems    General:  No chills, fever, night sweats or weight changes.  Cardiovascular:  No chest pain, dyspnea on exertion, edema, orthopnea, palpitations, paroxysmal nocturnal dyspnea. Dermatological: No rash, lesions/masses Respiratory: No cough, dyspnea Urologic: No hematuria, dysuria Abdominal:   No nausea, vomiting, diarrhea, bright red blood per rectum, melena, or hematemesis Neurologic:  No visual changes, wkns, changes in mental status. All other systems reviewed and are otherwise negative except as noted above.  Physical Exam    Blood pressure (!) 107/56, pulse (!) 45, temperature 98.6 F (37 C), temperature source Oral, resp. rate 18, height 5' 7"  (1.702 m), weight 75.3 kg (166 lb), SpO2 99 %.  General: Pleasant, NAD Psych: Normal affect. Neuro: Alert and oriented X 3. Moves all extremities spontaneously. HEENT: Normal  Neck: Supple without bruits or JVD. Lungs:  Resp regular and unlabored, CTA. Heart: RRR no s3, s4, or murmurs. Abdomen: Soft, non-tender, non-distended, BS + x 4.  Extremities: No clubbing, cyanosis or edema. DP/PT/Radials 2+ and equal bilaterally.  Labs    Troponin (Point of Care Test) No results for input(s): TROPIPOC in the last 72 hours. Recent Labs    06/14/17 1355  TROPONINI <0.03   Lab Results  Component Value Date   WBC 6.8 06/14/2017     HGB 14.8 06/14/2017   HCT 44.7 06/14/2017   MCV 87.0 06/14/2017   PLT 187 06/14/2017    Recent Labs  Lab 06/12/17 0804 06/14/17 1355  NA  --  135  K  --  4.4  CL  --  104  CO2  --  22  BUN  --  12  CREATININE  --  1.02  CALCIUM  --  9.2  PROT 5.7*  --   BILITOT 0.7  --   ALKPHOS 43  --   ALT 26  --   AST 45*  --   GLUCOSE  --  77   Lab Results  Component Value Date   CHOL 122 06/10/2017   HDL 41 06/10/2017   LDLCALC 60 06/10/2017   TRIG 103 06/10/2017   No results found for: [facility] Warner   Radiology Studies    Dg Chest 2 View  Result Date: 06/09/2017 CLINICAL DATA:  Chest pressure since 4 p.m. today. EXAM: CHEST  2 VIEW COMPARISON:  June 02, 2017 FINDINGS: The heart size and mediastinal contours are within normal limits. There is no focal infiltrate, pulmonary edema, or pleural effusion. There is chronic elevation of the right hemidiaphragm. The visualized skeletal structures are unremarkable. IMPRESSION: No active cardiopulmonary disease. Electronically Signed   By: Abelardo Diesel M.D.   On: 06/09/2017 20:22   Dg Chest Port 1 View  Result Date: 06/14/2017 CLINICAL DATA:  Chest pain. Recent heart catheterization and stent placement. EXAM: PORTABLE CHEST 1 VIEW COMPARISON:  Two-view chest x-ray 06/09/2017 FINDINGS: The heart size is exaggerated by low lung volumes. There is no edema or effusion. No focal airspace disease is present. The visualized soft tissues and bony thorax are unremarkable. IMPRESSION: 1. Low lung volumes. 2. No acute cardiopulmonary disease. Electronically Signed   By: San Morelle M.D.   On: 06/14/2017 14:35   Dg Chest Port 1 View  Result Date: 06/02/2017 CLINICAL DATA:  Heart palpitations EXAM: PORTABLE CHEST 1 VIEW COMPARISON:  Chest radiograph 04/24/2016 FINDINGS: The heart size and mediastinal contours are within normal limits.  Both lungs are clear. The visualized skeletal structures are unremarkable. IMPRESSION: No active disease.  Electronically Signed   By: Ulyses Jarred M.D.   On: 06/02/2017 21:51    ECG & Cardiac Imaging    Sinus bradycardia without ischemic changes.  Assessment & Plan    Chest pain: Status post PCI on 2/21.  To LAD.  No high risk features now.  Troponin negative.  ECG within normal limits.  No symptoms now.  Very low concern for stent occlusion.  Recommendations: We will keep her on heparin for now.  We will hold his Xarelto.  We will continue an aspirin and Plavix.  Will continue on his home beta-blocker and statin.  We will give him a GI cocktail and famotidine with some GI upset he has been reporting.  Keep him n.p.o. overnight just in case symptoms recur and we decided to take him to the Cath Lab.  This time point I think an initiation of long-acting nitroglycerin is probably all he will need.  Signed, Cristina Gong, MD 06/14/2017, 11:48 PM

## 2017-06-14 NOTE — Telephone Encounter (Signed)
Paged by answering service, BP of 10/50 with HR of 44. He took bisoprolol around 3 hours ago. Denies chest pain. However has intermittent dyspnea and pain between shoulder blade 30 minutes. spontaneously resolved in few minutes. His symptoms is different that presenting symptoms of recent admission. He had chest pain at that time. No orthopnea, PND, dizziness or syncope.   Encouraged hydration today. Take bisoprolol tomorrow only if HR >50 and BP >100/50. HR of 58s during recent admission. He will call us back tomorrow if any issue. If worsening of symptoms, will go to ER today.

## 2017-06-14 NOTE — ED Notes (Signed)
Patient states that his pain comes in crescendo.  He stated that he also has some gastric issues pointing to his epigastric area.

## 2017-06-15 ENCOUNTER — Telehealth (HOSPITAL_COMMUNITY): Payer: Self-pay

## 2017-06-15 DIAGNOSIS — R079 Chest pain, unspecified: Secondary | ICD-10-CM

## 2017-06-15 DIAGNOSIS — K219 Gastro-esophageal reflux disease without esophagitis: Secondary | ICD-10-CM | POA: Diagnosis not present

## 2017-06-15 DIAGNOSIS — I4891 Unspecified atrial fibrillation: Secondary | ICD-10-CM | POA: Diagnosis not present

## 2017-06-15 DIAGNOSIS — I2510 Atherosclerotic heart disease of native coronary artery without angina pectoris: Secondary | ICD-10-CM | POA: Diagnosis not present

## 2017-06-15 DIAGNOSIS — R0789 Other chest pain: Secondary | ICD-10-CM | POA: Diagnosis not present

## 2017-06-15 LAB — BASIC METABOLIC PANEL
Anion gap: 9 (ref 5–15)
BUN: 12 mg/dL (ref 6–20)
CHLORIDE: 106 mmol/L (ref 101–111)
CO2: 21 mmol/L — ABNORMAL LOW (ref 22–32)
Calcium: 8.6 mg/dL — ABNORMAL LOW (ref 8.9–10.3)
Creatinine, Ser: 0.92 mg/dL (ref 0.61–1.24)
GFR calc Af Amer: >60 mL/min (ref >60)
GFR calc non Af Amer: >60 mL/min (ref >60)
GLUCOSE: 108 mg/dL — AB (ref 65–99)
POTASSIUM: 4 mmol/L (ref 3.5–5.1)
Sodium: 136 mmol/L (ref 135–145)

## 2017-06-15 LAB — APTT
APTT: 91 s — AB (ref 24–36)
aPTT: 102 seconds — ABNORMAL HIGH (ref 24–36)

## 2017-06-15 LAB — TROPONIN I: Troponin I: <0.03 ng/mL (ref <0.03)

## 2017-06-15 MED ORDER — NITROGLYCERIN 0.4 MG SL SUBL: 0.4000 mg | SUBLINGUAL_TABLET | SUBLINGUAL | 0 refills | Status: DC | PRN

## 2017-06-15 MED ORDER — ALBUTEROL SULFATE (2.5 MG/3ML) 0.083% IN NEBU: 3.0000 mL | INHALATION_SOLUTION | Freq: Four times a day (QID) | RESPIRATORY_TRACT | Status: DC | PRN

## 2017-06-15 MED ORDER — BISOPROLOL FUMARATE 5 MG PO TABS: 2.5000 mg | ORAL_TABLET | Freq: Every day | ORAL | 0 refills | Status: DC

## 2017-06-15 MED ORDER — MOMETASONE FURO-FORMOTEROL FUM 200-5 MCG/ACT IN AERO: 2.0000 | INHALATION_SPRAY | Freq: Two times a day (BID) | RESPIRATORY_TRACT | Status: DC | PRN

## 2017-06-15 NOTE — Plan of Care (Signed)
  Education: Understanding of cardiac disease, CV risk reduction, and recovery process will improve 06/15/2017 1018 - Completed/Met by Shanon Rosser, RN   Cardiac: Ability to achieve and maintain adequate cardiovascular perfusion will improve 06/15/2017 1018 - Progressing by Shanon Rosser, RN

## 2017-06-15 NOTE — Progress Notes (Signed)
ANTICOAGULATION CONSULT NOTE Pharmacy Consult:  Heparin Indication: chest pain/ACS  Allergies  Allergen Reactions  . Gluten Meal Other (See Comments)    celiac  . Wheat Bran Other (See Comments)    Celiac   . Sulfa Antibiotics Rash    Patient Measurements: Height: 5\' 7"  (170.2 cm) Weight: 166 lb (75.3 kg) IBW/kg (Calculated) : 66.1 Heparin Dosing Weight: 75 kg  Vital Signs: Temp: 97.7 F (36.5 C) (02/24 2357) Temp Source: Oral (02/24 2357) BP: 97/58 (02/25 0307) Pulse Rate: 45 (02/24 2016)  Labs: Recent Labs    06/12/17 0549 06/14/17 1355 06/15/17 0257  HGB 13.8 14.8  --   HCT 40.6 44.7  --   PLT 162 187  --   APTT 72* 33 91*  HEPARINUNFRC 0.43 0.80*  --   CREATININE 0.92 1.02  --   TROPONINI  --  <0.03  --     Medical History: Past Medical History:  Diagnosis Date  . Anemia   . Atrial fibrillation ([place]) 06/05/2017  . Bronchitis 05-10-12   past fall- 4 runs antibiotics due to bronchitis-uses [employer] as needed  . Celiac disease   . Colon polyps    adenomatous  . Coronary artery disease   . Diverticulosis   . GERD (gastroesophageal reflux disease)   . Hepatitis 05-10-12   "was told non A, non B"-unclean dental equip.  Marland Kitchen Hyperlipidemia   . Iron deficiency anemia   . Prostate cancer ([place]) 05-10-12   ;bx. 04-12-12, dx  . Spasm of esophagus 2013     Assessment: 14 YOM discharged on 06/12/17 post cath with stent to LAD.  Patient has been on Xarelto for Afib s/p recent DCCV.  [facility] to [facility] with chest pain and Pharmacy consulted to initiate IV heparin.  Per documentation, patient last took Xarelto on 06/13/17 at 1600.  Baseline heparin level is high due to PTA Xarelto use. Initial aPTT is wnl  Goal of Therapy:  Heparin level 0.3-0.7 units/ml aPTT 66 - 102 seconds Monitor platelets by anticoagulation protocol: Yes    Plan:  Continue heparin infusion at 1000 units/hr Check confirmatory aPTT Daily heparin level, aPTT, CBC   Harvel Quale 06/15/2017 3:54 AM

## 2017-06-15 NOTE — Plan of Care (Signed)
  Education: Knowledge of General Education information will improve 06/15/2017 1046 - Adequate for Discharge by Shanon Rosser, RN   Health Behavior/Discharge Planning: Ability to manage health-related needs will improve 06/15/2017 1046 - Adequate for Discharge by Shanon Rosser, RN   Education: Understanding of cardiac disease, CV risk reduction, and recovery process will improve 06/15/2017 1018 - Completed/Met by Shanon Rosser, RN   Activity: Ability to tolerate increased activity will improve 06/15/2017 1046 - Adequate for Discharge by Shanon Rosser, RN   Cardiac: Ability to achieve and maintain adequate cardiovascular perfusion will improve 06/15/2017 1046 - Adequate for Discharge by Shanon Rosser, RN 06/15/2017 1018 - Progressing by Shanon Rosser, RN   Health Behavior/Discharge Planning: Ability to safely manage health-related needs after discharge will improve 06/15/2017 1046 - Adequate for Discharge by Shanon Rosser, RN

## 2017-06-15 NOTE — Care Management Obs Status (Signed)
[facility] NOTIFICATION   Patient Details  Name: Jose Warner MRN: [id number] Date of Birth: 26-Aug-1950   Medicare Observation Status Notification Given:  Yes    Bethena Roys, RN 06/15/2017, 10:58 AM

## 2017-06-15 NOTE — Care Management CC44 (Signed)
Condition Code 44 Documentation Completed  Patient Details  Name: Jose Warner MRN: [id number] Date of Birth: [birth year]   Condition Code 44 given:  Yes Patient signature on Condition Code 44 notice:  Yes Documentation of 2 MD's agreement:  Yes Code 44 added to claim:  Yes    Bethena Roys, RN 06/15/2017, 10:58 AM

## 2017-06-15 NOTE — Telephone Encounter (Signed)
Patients insurance is active and benefits verified through Medicare A/B - No co-pay, deductible amount of $185.00/$42.17, no out of pocket, 20% co-insurance, and no pre-authorization is required. Passport/reference [phone]  Patients insurance is active and benefits verified through [facility] - No co-pay, no deductible, no out of pocket, no co-insurance, and no pre-authorization is required. Passport/reference [phone]  Patient will be contacted and scheduled upon review by the RN Navigator.

## 2017-06-15 NOTE — Progress Notes (Addendum)
Progress Note  Patient Name: Jose Warner Date of Encounter: 06/15/2017  Primary Cardiologist: Shelva Majestic, MD   Subjective   Denies any chest pain or chest discomfort this morning.  Denies chest pain.  States the GI cocktail helped with his stomach upset.  Inpatient Medications    Scheduled Meds: . aspirin EC  81 mg Oral Daily  . bisoprolol  5 mg Oral Daily  . clopidogrel  75 mg Oral Daily  . famotidine  10 mg Oral BID  . niacin  1,000 mg Oral QHS  . nitroGLYCERIN  1 inch Topical Q6H  . pantoprazole  40 mg Oral Daily  . simvastatin  40 mg Oral Daily   Continuous Infusions: . heparin 1,000 Units/hr (06/14/17 1528)   PRN Meds: acetaminophen, albuterol, mometasone-formoterol, nitroGLYCERIN, ondansetron (ZOFRAN) IV   Vital Signs    Vitals:   06/15/17 0300 06/15/17 0307 06/15/17 0529 06/15/17 0820  BP:  (!) 97/58 (!) 107/56 (!) 105/41  Pulse:   (!) 52 (!) 49  Resp:  16    Temp:   97.7 F (36.5 C) (!) 97.5 F (36.4 C)  TempSrc:   Oral Oral  SpO2: 97%  97% 100%  Weight:   75.4 kg (166 lb 3.2 oz)   Height:        Intake/Output Summary (Last 24 hours) at 06/15/2017 0828 Last data filed at 06/15/2017 0000 Gross per 24 hour  Intake 96.25 ml  Output -  Net 96.25 ml   Filed Weights   06/14/17 1343 06/15/17 0529  Weight: 75.3 kg (166 lb) 75.4 kg (166 lb 3.2 oz)    Telemetry    Sinus bradycardia - Personally Reviewed  ECG    Sinus bradycardia - Personally Reviewed  Physical Exam   GEN: No acute distress.   Neck: No JVD Cardiac: RRR, no murmurs, rubs, or gallops.  Respiratory: Clear to auscultation bilaterally. GI: Soft, nontender, non-distended  MS: No edema; No deformity. Neuro:  Nonfocal  Psych: Normal affect   Labs    Chemistry Recent Labs  Lab 06/12/17 0549 06/12/17 0804 06/14/17 1355 06/15/17 0257  NA 139  --  135 136  K 4.0  --  4.4 4.0  CL 109  --  104 106  CO2 18*  --  22 21*  GLUCOSE 101*  --  77 108*  BUN 10  --  12 12    CREATININE 0.92  --  1.02 0.92  CALCIUM 8.6*  --  9.2 8.6*  PROT  --  5.7*  --   --   ALBUMIN  --  3.4*  --   --   AST  --  45*  --   --   ALT  --  26  --   --   ALKPHOS  --  43  --   --   BILITOT  --  0.7  --   --   GFRNONAA >60  --  >60 >60  GFRAA >60  --  >60 >60  ANIONGAP 12  --  9 9     Hematology Recent Labs  Lab 06/11/17 0237 06/12/17 0549 06/14/17 1355  WBC 5.9 5.9 6.8  RBC 4.80 4.60 5.14  HGB 13.7 13.8 14.8  HCT 42.5 40.6 44.7  MCV 88.5 88.3 87.0  MCH 28.5 30.0 28.8  MCHC 32.2 34.0 33.1  RDW 14.0 14.4 14.1  PLT 172 162 187    Cardiac Enzymes Recent Labs  Lab 06/10/17 1533 06/10/17 2126 06/14/17 1355 06/15/17  0257  TROPONINI <0.03 <0.03 <0.03 <0.03   No results for input(s): TROPIPOC in the last 168 hours.   BNPNo results for input(s): BNP, PROBNP in the last 168 hours.   DDimer No results for input(s): DDIMER in the last 168 hours.   Radiology    Dg Chest Port 1 View  Result Date: 06/14/2017 CLINICAL DATA:  Chest pain. Recent heart catheterization and stent placement. EXAM: PORTABLE CHEST 1 VIEW COMPARISON:  Two-view chest x-ray 06/09/2017 FINDINGS: The heart size is exaggerated by low lung volumes. There is no edema or effusion. No focal airspace disease is present. The visualized soft tissues and bony thorax are unremarkable. IMPRESSION: 1. Low lung volumes. 2. No acute cardiopulmonary disease. Electronically Signed   By: San Morelle M.D.   On: 06/14/2017 14:35    Cardiac Studies   Left Heart Cath - 06/11/17   Acute coronary syndrome with a pattern of unstable angina due to 95% mid LAD obstruction within the diffusely diseased/calcified segment.   Normal left main   Normal circumflex   Normal right coronary   Normal left ventricular function with EF estimated to be 60%.  LVEDP is normal.   Successful angioplasty and stenting of the LAD 95% stenosis to 0% using a 16 mm x 2.75 mm Synergy postdilated to high pressure with a 2.75  balloon resulting in TIMI grade III flow and proximal and distal step up.  TTE - 06/10/17 Left ventricle: The cavity size was normal. Wall thickness was   normal. Systolic function was normal. The estimated ejection   fraction was in the range of 50% to 55%. Wall motion was normal;   there were no regional wall motion abnormalities. Doppler   parameters are consistent with abnormal left ventricular   relaxation (grade 1 diastolic dysfunction). - Aortic valve: Trileaflet; mildly thickened, mildly calcified   leaflets. There was mild regurgitation. - Mitral valve: There was trivial regurgitation. - Left atrium: The atrium was mildly dilated. Anterior-posterior   dimension: 43 mm.  Patient Profile     67 y.o. male with history of CAD with PCI to LAD on Thursday 2\21\19 (95% LAD lesion treated with DES).He reported chest pain that started at rest while being around family.  He has been compliant with xarelto and plavix.  Pain has resolved with nitroglycerin.    Assessment & Plan    Chest pain Status post PCI on 2/21.  Troponins neg x 2.  EKG without ischemic changes.  Currently chest pain free and low suspicion for stent occulusion.  Patient may benefit from long acting nitrate.  Blood pressures are soft and may need to start medication as outpatient and titrate slowly.   Sinus bradycardia Heart rates in the 40s-50s.  Will decrease home bisoprolol to 2.5mg  daily.   For questions or updates, please contact [place] Please consult [email or web address] for contact info under Cardiology/STEMI.      Signed, Boyd Kerbs, DO  06/15/2017, 8:28 AM

## 2017-06-15 NOTE — Discharge Summary (Signed)
Discharge Summary    Patient ID: Jose Warner,  MRN: [id number], DOB/AGE: [birth year] 67 y.o.  Admit date: 06/14/2017 Discharge date: 06/15/2017  Primary Care Provider: Derinda Late Primary Cardiologist: Dr. Claiborne Billings  Discharge Diagnoses    Active Problems:   ACS (acute coronary syndrome) ([place])   Allergies Allergies  Allergen Reactions  . Gluten Meal Other (See Comments)    celiac  . Wheat Bran Other (See Comments)    Celiac   . Sulfa Antibiotics Rash    Diagnostic Studies/Procedures    TTE on 06/10/17 and Left heart cath 06/11/17, none this admission _____________   History of Present Illness     Mr Rising has CAD, with PCI to LAD on Thursday 2\21\19 (95% LAD lesion treated with DES).He reports chest pain that started at rest while being around family. No strenous activity. No change with position. Similar to the pain he had prior to PCI. Patient has been taking Xarelto and Plavix.  No missed doses.  He had pain upon presentation to the emergency room.  His vital signs were at baseline,  with a heart rate of around 50 and a blood pressure of around 390Z systolic.   In the emergency room he was started nitroglycerin which improved his pain.  Ever since he was started on nitroglycerin he remained pain-free.  His blood pressure has been soft in the low 100s.  His nitroglycerin drip was discontinued.  Patient remained pain-free.     Hospital Course      Chest pain Patient presented with chest pain on 2/24 status post PCI on 2/21.  Troponins neg x 2.  EKG without ischemic changes and so low suspicion for stent occulusion.  He was started on a nitroglycerin drip that resolved chest pain.  He was taken off drip and remained chest pain free on day of discharge.  Since heart rates were low in the 40s decreased beta blocker to bisoprolol 2.5mg  daily.  Patient may benefit from long acting nitrate however due to soft pressures, 00P systolic, this medication may need to be started  as outpatient and titrate slowly.  Patient has follow up with Dr. Claiborne Billings on 07/02/17.   _____________  Discharge Vitals Blood pressure (!) 105/41, pulse (!) 49, temperature (!) 97.5 F (36.4 C), temperature source Oral, resp. rate 16, height 5\' 7"  (1.702 m), weight 75.4 kg (166 lb 3.2 oz), SpO2 100 %.  Filed Weights   06/14/17 1343 06/15/17 0529  Weight: 75.3 kg (166 lb) 75.4 kg (166 lb 3.2 oz)    Labs & Radiologic Studies    CBC Recent Labs    06/14/17 1355  WBC 6.8  HGB 14.8  HCT 44.7  MCV 87.0  PLT 233   Basic Metabolic Panel Recent Labs    06/14/17 1355 06/15/17 0257  NA 135 136  K 4.4 4.0  CL 104 106  CO2 22 21*  GLUCOSE 77 108*  BUN 12 12  CREATININE 1.02 0.92  CALCIUM 9.2 8.6*   Liver Function Tests No results for input(s): AST, ALT, ALKPHOS, BILITOT, PROT, ALBUMIN in the last 72 hours. No results for input(s): LIPASE, AMYLASE in the last 72 hours. Cardiac Enzymes Recent Labs    06/14/17 1355 06/15/17 0257  TROPONINI <0.03 <0.03   BNP Invalid input(s): POCBNP D-Dimer No results for input(s): DDIMER in the last 72 hours. Hemoglobin A1C No results for input(s): HGBA1C in the last 72 hours. Fasting Lipid Panel No results for input(s): CHOL, HDL, LDLCALC, TRIG,  CHOLHDL, LDLDIRECT in the last 72 hours. Thyroid Function Tests No results for input(s): TSH, T4TOTAL, T3FREE, THYROIDAB in the last 72 hours.  Invalid input(s): FREET3 _____________  Dg Chest 2 View  Result Date: 06/09/2017 CLINICAL DATA:  Chest pressure since 4 p.m. today. EXAM: CHEST  2 VIEW COMPARISON:  June 02, 2017 FINDINGS: The heart size and mediastinal contours are within normal limits. There is no focal infiltrate, pulmonary edema, or pleural effusion. There is chronic elevation of the right hemidiaphragm. The visualized skeletal structures are unremarkable. IMPRESSION: No active cardiopulmonary disease. Electronically Signed   By: Abelardo Diesel M.D.   On: 06/09/2017 20:22   Dg  Chest Port 1 View  Result Date: 06/14/2017 CLINICAL DATA:  Chest pain. Recent heart catheterization and stent placement. EXAM: PORTABLE CHEST 1 VIEW COMPARISON:  Two-view chest x-ray 06/09/2017 FINDINGS: The heart size is exaggerated by low lung volumes. There is no edema or effusion. No focal airspace disease is present. The visualized soft tissues and bony thorax are unremarkable. IMPRESSION: 1. Low lung volumes. 2. No acute cardiopulmonary disease. Electronically Signed   By: San Morelle M.D.   On: 06/14/2017 14:35   Dg Chest Port 1 View  Result Date: 06/02/2017 CLINICAL DATA:  Heart palpitations EXAM: PORTABLE CHEST 1 VIEW COMPARISON:  Chest radiograph 04/24/2016 FINDINGS: The heart size and mediastinal contours are within normal limits. Both lungs are clear. The visualized skeletal structures are unremarkable. IMPRESSION: No active disease. Electronically Signed   By: Ulyses Jarred M.D.   On: 06/02/2017 21:51   Disposition   Pt is being discharged home today in good condition.  Follow-up Plans & Appointments     Discharge Instructions    Diet - low sodium heart healthy   Complete by:  As directed    Discharge instructions   Complete by:  As directed    Please start taking bisoprolol 2.5mg  daily You may use sublingual nitroglycerin up to 3 times for chest pain.  If needs more please seek medical attention Prescriptions have been sent to your [facility]   Increase activity slowly   Complete by:  As directed       Discharge Medications   Allergies as of 06/15/2017      Reactions   Gluten Meal Other (See Comments)   celiac   Wheat Bran Other (See Comments)   Celiac   Sulfa Antibiotics Rash      Medication List    TAKE these medications   aspirin 81 MG chewable tablet Chew 81 mg by mouth daily.   bisoprolol 5 MG tablet Commonly known as:  ZEBETA Take 0.5 tablets (2.5 mg total) by mouth daily. What changed:    medication strength  how much to  take   budesonide-formoterol 160-4.5 MCG/ACT inhaler Commonly known as:  SYMBICORT Inhale 2 puffs into the lungs 2 (two) times daily.   clopidogrel 75 MG tablet Commonly known as:  PLAVIX Take 1 tablet (75 mg total) by mouth daily for 21 days.   guaiFENesin 600 MG 12 hr tablet Commonly known as:  MUCINEX Take 600 mg by mouth 2 (two) times daily as needed.   MULTIVITAMIN PO Take 1 tablet by mouth daily.   niacin 1000 MG CR tablet Commonly known as:  NIASPAN Take 1 tablet by mouth at bedtime.   nitroGLYCERIN 0.4 MG SL tablet Commonly known as:  NITROSTAT Place 1 tablet (0.4 mg total) under the tongue every 5 (five) minutes x 3 doses as needed for chest  pain.   omeprazole 20 MG capsule Commonly known as:  PRILOSEC Take 20 mg by mouth at bedtime.   PROAIR HFA 108 (90 Base) MCG/ACT inhaler Generic drug:  albuterol Take 2 puffs by mouth 2 (two) times daily as needed. For shortness of breath.   rivaroxaban 20 MG Tabs tablet Commonly known as:  XARELTO Take 1 tablet (20 mg total) by mouth daily with supper.   simvastatin 40 MG tablet Commonly known as:  ZOCOR Take 1 tablet by mouth daily.   Vitamin D (Ergocalciferol) 2000 units Caps Take 1 capsule by mouth daily.        Aspirin prescribed at discharge? Yes, already taking High Intensity Statin Prescribed? On simvastatin, can not tolerate other statin  Beta Blocker Prescribed? yes For EF <40%, was ACEI/ARB Prescribed? Not applicable ADP Receptor Inhibitor Prescribed? Yes, on plavix and aspirin  For EF <40%, Aldosterone Inhibitor Prescribed? Not applicable Was EF assessed during THIS hospitalization? No, most recent 2/20 Was Cardiac Rehab II ordered? Not needed   Outstanding Labs/Studies   none  Duration of Discharge Encounter   Greater than 30 minutes including physician time.  Signed, Reino Bellis NP-C 06/15/2017, 10:51 AM

## 2017-07-02 ENCOUNTER — Encounter: Payer: Self-pay | Admitting: Cardiovascular Disease

## 2017-07-02 ENCOUNTER — Ambulatory Visit (INDEPENDENT_AMBULATORY_CARE_PROVIDER_SITE_OTHER): Payer: Medicare Other | Admitting: Cardiovascular Disease

## 2017-07-02 VITALS — BP 117/66 | HR 54 | Ht 68.0 in | Wt 172.2 lb

## 2017-07-02 DIAGNOSIS — Z8546 Personal history of malignant neoplasm of prostate: Secondary | ICD-10-CM | POA: Diagnosis not present

## 2017-07-02 DIAGNOSIS — I249 Acute ischemic heart disease, unspecified: Secondary | ICD-10-CM

## 2017-07-02 DIAGNOSIS — I480 Paroxysmal atrial fibrillation: Secondary | ICD-10-CM | POA: Diagnosis not present

## 2017-07-02 DIAGNOSIS — J45909 Unspecified asthma, uncomplicated: Secondary | ICD-10-CM

## 2017-07-02 DIAGNOSIS — E782 Mixed hyperlipidemia: Secondary | ICD-10-CM

## 2017-07-02 DIAGNOSIS — I25119 Atherosclerotic heart disease of native coronary artery with unspecified angina pectoris: Secondary | ICD-10-CM | POA: Diagnosis not present

## 2017-07-02 DIAGNOSIS — Z7901 Long term (current) use of anticoagulants: Secondary | ICD-10-CM | POA: Diagnosis not present

## 2017-07-02 DIAGNOSIS — K219 Gastro-esophageal reflux disease without esophagitis: Secondary | ICD-10-CM

## 2017-07-02 MED ORDER — CLOPIDOGREL BISULFATE 75 MG PO TABS: 75.0000 mg | ORAL_TABLET | Freq: Every day | ORAL | 3 refills | Status: DC

## 2017-07-02 NOTE — Patient Instructions (Signed)
Medication Instructions:  Your physician recommends that you continue on your current medications as directed. Please refer to the Current Medication list given to you today.  Follow-Up: 2-3 months with Dr. Kelly  Any Other Special Instructions Will Be Listed Below (If Applicable).     If you need a refill on your cardiac medications before your next appointment, please call your pharmacy.   

## 2017-07-02 NOTE — Progress Notes (Signed)
Patient ID: Jose Warner, male   DOB: 01-26-1951, 67 y.o.   MRN: [id number]     PCP: Dr. Derinda Late   HPI: Jose Warner is a 67 y.o. male who presents for a one year cardiology followup evaluation.   Mr. Haisley has a strong family history for premature coronary artery disease. In May 2006 cardiac catheterization showed very mild LAD and circumflex narrowing felt to be not flow limiting at approximately 20-30%. He has a history of low HDL levels. He developed focal prostate cancer and underwent robotic prostatectomy by Dr. Araceli Bouche in 2014 When I saw him after his prostate surgery in March 2014 he was in atrial fibrillation at which time he was completely unaware of this rhythm disturbance. He was started on eliquis 5 mg twice a day anticoagulation and as well as metoprolol succinate. He subsequently converted spontaneously to sinus rhythm and has been maintaining sinus rhythm since. He remains active. He exercises daily. He denies any chest pain. He denies shortness of breath. He denies palpitations. His echo Doppler study showed an ejection fraction in the 55-65% range. He had  upper normal LA size. There is mild aortic insufficiency. Pulmonary pressures were normal with an estimated RV systolic pressure 15 mm.  In the past, he had been on Niaspan for low HDL levels. This was stopped when he was also put on cialis following his prostate surgery. He no longer takes cialis.  Laboratory done in April 2014 showed cholesterol 131 triglycerides 96 his HDL remains very low at 23 and his LDL was 89. Thyroid function studies were normal as were his hemoglobin hematocrit and chemistries.  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  He denies any episodes of chest pain.  Dr. Sandi Mariscal has put him back on his simvastatin 40 mg and niacin 1000 mg based on laboratory that he had done.  He continues to take Bystolic 5 mg for hypertension.  He continues to have  difficulty with erectile function following his prostate surgery.  He also was diagnosed with celiac disease and is extremely sensitive to gluten.   He remains active.  He is not aware of any recurrent arrhythmia.  Dr. Sandi Mariscal checks laboratory and he was told that his labs most recently were excellent.  He is tolerating Bystolic 5 mg.  He is followed by Dr. Alinda Money for his prostate.  He is on a gluten-free diet.   Since I last saw him, he was hospitalized on 06/09/2017 after having experienced intermittent chest pain  He was felt to potentially have unstableand stenting of a 95% mid LAD stenosiswithin a diffusely diseasedlcified segmen  There also was mild 40% narrowing proximal and distal to the stent.  He was discharged on 06/12/2017.  He dev vague recurrent chest pain leading to an additional overnight hospital stayon from 324.  Troponins were negative.  ECG was without ischemic changes and he was   Since he was bradycardic with heart rates in the 40s.    Presently, he feels well and is gradually gaining his strength. With his PAF history he is now on Plavix and Xarelto. He is on bisoprolol 2.5 mg daily.  He continues to be on niacin and simvastatin per Dr. Sandi Mariscal.  He denies recent asthma exacerbation on Symbicort.  He presents for evaluation.  Past Medical History:  Diagnosis Date  . Anemia   . Atrial fibrillation ([place]) 06/05/2017  . Bronchitis 05-10-12   past fall- 4 runs antibiotics  due to bronchitis-uses [employer] as needed  . Celiac disease   . Colon polyps    adenomatous  . Coronary artery disease   . Diverticulosis   . GERD (gastroesophageal reflux disease)   . Hepatitis 05-10-12   "was told non A, non B"-unclean dental equip.  Marland Kitchen Hyperlipidemia   . Iron deficiency anemia   . Prostate cancer ([place]) 05-10-12   ;bx. 04-12-12, dx  . Spasm of esophagus 2013    Past Surgical History:  Procedure Laterality Date  . CARDIAC CATHETERIZATION  09/13/2004   LAD:30%-40% in prox. to mid  segment with 20% in the mid segment and 20% in left Circ., mild 20% right common iliac narrowing. medical therapy  . CARDIOVERSION  06/05/2017  . CORONARY STENT INTERVENTION N/A 06/11/2017   Procedure: CORONARY STENT INTERVENTION;  Surgeon: Belva Crome, MD;  Location: [facility];  Service: Cardiovascular;  Laterality: N/A;  . HERNIA REPAIR  5 yrs ago   right   . LEFT HEART CATH AND CORONARY ANGIOGRAPHY N/A 06/11/2017   Procedure: LEFT HEART CATH AND CORONARY ANGIOGRAPHY;  Surgeon: Belva Crome, MD;  Location: [facility];  Service: Cardiovascular;  Laterality: N/A;  . NM MYOCAR PERF WALL MOTION  11/30/2007   protocol:[place], post EF 67%,mild perfusion defect seen in basal inferior consistant with Attenuation artifact, exercise cap. 12METS  . ROBOT ASSISTED LAPAROSCOPIC RADICAL PROSTATECTOMY  05/20/2012   Procedure: ROBOTIC ASSISTED LAPAROSCOPIC RADICAL PROSTATECTOMY LEVEL 1;  Surgeon: Dutch Gray, MD;  Location: [facility];  Service: Urology;  Laterality: N/A;     . TRANSURETHRAL RESECTION OF PROSTATE  30yr ago    Allergies  Allergen Reactions  . Gluten Meal Other (See Comments)    celiac  . Wheat Bran Other (See Comments)    Celiac   . Sulfa Antibiotics Rash    Current Outpatient Medications  Medication Sig Dispense Refill  . bisoprolol (ZEBETA) 5 MG tablet Take 0.5 tablets (2.5 mg total) by mouth daily. 30 tablet 0  . budesonide-formoterol (SYMBICORT) 160-4.5 MCG/ACT inhaler Inhale 2 puffs into the lungs 2 (two) times daily as needed (for cough).     . clopidogrel (PLAVIX) 75 MG tablet Take 1 tablet (75 mg total) by mouth daily. 90 tablet 3  . guaiFENesin (MUCINEX) 600 MG 12 hr tablet Take 600 mg by mouth 2 (two) times daily as needed.    . Multiple Vitamins-Minerals (MULTIVITAMIN PO) Take 1 tablet by mouth at bedtime.     . niacin (NIASPAN) 1000 MG CR tablet Take 1 tablet by mouth at bedtime.     . nitroGLYCERIN (NITROSTAT) 0.4 MG SL tablet Place 1 tablet (0.4 mg  total) under the tongue every 5 (five) minutes x 3 doses as needed for chest pain. 30 tablet 0  . PROAIR HFA 108 (90 BASE) MCG/ACT inhaler Take 2 puffs by mouth 2 (two) times daily as needed. For shortness of breath.    . simvastatin (ZOCOR) 40 MG tablet Take 1 tablet by mouth at bedtime.     . Vitamin D, Ergocalciferol, 2000 units CAPS Take 1 capsule by mouth at bedtime.     . pantoprazole (PROTONIX) 40 MG tablet at bedtime.    . rivaroxaban (XARELTO) 20 MG TABS tablet Take 1 tablet (20 mg total) by mouth daily with supper. 90 tablet 0   No current facility-administered medications for this visit.     Social History   Socioeconomic History  . Marital status: Married    Spouse  name: Not on file  . Number of children: 3  . Years of education: Not on file  . Highest education level: Not on file  Social Needs  . Financial resource strain: Not on file  . Food insecurity - worry: Patient refused  . Food insecurity - inability: Patient refused  . Transportation needs - medical: Patient refused  . Transportation needs - non-medical: Patient refused  Occupational History    Employer: [employer]  Tobacco Use  . Smoking status: Never Smoker  . Smokeless tobacco: Never Used  Substance and Sexual Activity  . Alcohol use: Yes    Alcohol/week: 1.2 oz    Types: 2 Glasses of wine per week    Comment: occ. x 2 drinks weekly  . Drug use: No  . Sexual activity: Yes    Partners: Female  Other Topics Concern  . Not on file  Social History Narrative  . Not on file    Socially, he is a PhD and Is now retired from [employer].  He had been in the turf business and floral arrangements.  ROS General: Negative; No fevers, chills, or night sweats;  HEENT: Negative; No changes in vision or hearing, sinus congestion, difficulty swallowing Pulmonary: Negative; No cough, wheezing, shortness of breath, hemoptysis Cardiovascular: see HPI GI: Positive for celiac disease GU: Positive for erectile  dysfunction since his robotic prostatic surgery No dysuria, hematuria, or difficulty voiding Musculoskeletal: Negative; no myalgias, joint pain, or weakness Hematologic/Oncology: Negative; no easy bruising, bleeding Endocrine: Negative; no heat/cold intolerance; no diabetes Neuro: Negative; no changes in balance, headaches Skin: Negative; No rashes or skin lesions Psychiatric: Negative; No behavioral problems, depression Sleep: Negative; No snoring, daytime sleepiness, hypersomnolence, bruxism, restless legs, hypnogognic hallucinations, no cataplexy Other comprehensive 14 point system review is negative.   PE BP 117/66   Pulse (!) 54   Ht 5' 8"  (1.727 m)   Wt 172 lb 3.2 oz (78.1 kg)   BMI 26.18 kg/m     Repeat blood pressure by me was 114/70.  Wt Readings from Last 3 Encounters:  07/02/17 172 lb 3.2 oz (78.1 kg)  06/15/17 166 lb 3.2 oz (75.4 kg)  06/12/17 166 lb 14.2 oz (75.7 kg)   General: Alert, oriented, no distress.  Skin: normal turgor, no rashes, warm and dry HEENT: Normocephalic, atraumatic. Pupils equal round and reactive to light; sclera anicteric; extraocular muscles intact;   Nose without nasal septal hypertrophy Mouth/Parynx benign; Mallinpatti scale 2 Neck: No JVD, no carotid bruits; normal carotid upstroke Lungs: clear to ausculatation and percussion; no wheezing or rales Chest wall: without tenderness to palpitation Heart: PMI not displaced, RRR, s1 s2 normal, 1/6 systolic murmur, no diastolic murmur, no rubs, gallops, thrills, or heaves Abdomen: soft, nontender; no hepatosplenomehaly, BS+; abdominal aorta nontender and not dilated by palpation. Back: no CVA tenderness Pulses 2+ Musculoskeletal: full range of motion, normal strength, no joint deformities Extremities: no clubbing cyanosis or edema, Homan's sign negative  Neurologic: grossly nonfocal; Cranial nerves grossly wnl Psychologic: Normal mood and affect   ECG (independently read by me): sinus  bradycardia 51 bpm.  No ST segment changes.  Normal intervals.  November 2018ECG (independently read by me): Sinus bradycardia 56 bpm.  No ectopy.  Normal intervals.  October 2017 ECG (independently read by me): Sinus bradycardia 59 bpm.  Normal intervals.  No ectopy.  May 2015 ECG (and apparently read by me): Normal sinus rhythm at 60 beats per minute.  Early transition.  No ectopy.  PR interval 174  ms, QTc interval 460 ms.  ECG: (independently read by me): Sinus rhythm at 56 beats per minute. No ectopy. Normal intervals.  LABS:  BMP Latest Ref Rng & Units 06/15/2017 06/14/2017 06/12/2017  Glucose 65 - 99 mg/dL 108(H) 77 101(H)  BUN 6 - 20 mg/dL 12 12 10   Creatinine 0.61 - 1.24 mg/dL 0.92 1.02 0.92  Sodium 135 - 145 mmol/L 136 135 139  Potassium 3.5 - 5.1 mmol/L 4.0 4.4 4.0  Chloride 101 - 111 mmol/L 106 104 109  CO2 22 - 32 mmol/L 21(L) 22 18(L)  Calcium 8.9 - 10.3 mg/dL 8.6(L) 9.2 8.6(L)    Hepatic Function Panel     Component Value Date/Time   PROT 5.7 (L) 06/12/2017 0804     CBC Latest Ref Rng & Units 06/14/2017 06/12/2017 06/11/2017  WBC 4.0 - 10.5 K/uL 6.8 5.9 5.9  Hemoglobin 13.0 - 17.0 g/dL 14.8 13.8 13.7  Hematocrit 39.0 - 52.0 % 44.7 40.6 42.5  Platelets 150 - 400 K/uL 187 162 172    BNP No results found for: PROBNP  Lipid Panel     Component Value Date/Time   CHOL 122 06/10/2017 0108     RADIOLOGY: No results found.  IMPRESSION:  1. Coronary artery disease involving native coronary artery of native heart with angina pectoris ([place])   2. Mixed hyperlipidemia   3. History of prostate cancer   4. Mild asthma without complication, unspecified whether persistent   5. Paroxysmal atrial fibrillation ([facility])   6. Anticoagulation adequate   7. Gastroesophageal reflux disease without esophagitis     ASSESSMENT AND PLAN: Mr. Paff Is a 67 year-old gentleman who had undergone cardiac catheterization in May 2006 and was found to have mild nonobstructive CAD.  He  has a history of hyperlipidemia with very low HDL levels and is back on niacin in addition to his simvastatin 40 mg followed by Dr. Sandi Mariscal.    He developed atrial fibrillation for which he was unaware following his robotic prostatectomy for his prostate cancer.  He has been maintaining sinus rhythm without recurrent atrial fibrillation.  Marland Kitchen  He did recently developed intermittent chest painl eading to hospitalization in 06/09/2017.  His symptom complex was worrisome for  Revealed a 95% mid LAD stenosis and a calcified segment w  Initially was on aspirin/Plavix/Xarelto, but his aspirin was recently discontinued  His shortness of breath is better.  He denies recent bleeding.  He has significant mixed hyperlipidemia.  Continues .  Most recent cholesterol was 122, LDL 60, triglycerides 103, and HDL 41.  He has GERD.  He was taken off omeprazole and is now on pantoprazole in light of concomitant  His mild asthma is controlled with symbicort and  Albuterol. His bisoprolol dose was recently decreased to 2.5 mg due to HR in the 40s and he is asymptomatic.  I will see him in 3 month for reevaluation. Time spent: 25 minutes  Troy Sine, MD, [facility]  07/03/2017  4:12 PM

## 2017-07-03 ENCOUNTER — Telehealth: Payer: Self-pay | Admitting: Cardiovascular Disease

## 2017-07-03 ENCOUNTER — Other Ambulatory Visit: Payer: Self-pay | Admitting: Pharmacist

## 2017-07-03 ENCOUNTER — Encounter: Payer: Self-pay | Admitting: Cardiovascular Disease

## 2017-07-03 MED ORDER — RIVAROXABAN 20 MG PO TABS: 20.0000 mg | ORAL_TABLET | Freq: Every day | ORAL | 0 refills | Status: DC

## 2017-07-03 NOTE — Telephone Encounter (Signed)
New Message     *STAT* If patient is at the pharmacy, call can be transferred to refill team.   1. Which medications need to be refilled? (please list name of each medication and dose if known) rivaroxaban (XARELTO) 20 MG TABS tablet    2. Which pharmacy/location (including street and city if local pharmacy) is medication to be sent to? [facility] [address]  3. Do they need a 30 day or 90 day supply? 30   Patient states that he only has enough available until Monday. He is requesting an rx to be sent to the [facility] for a 30 day supply. Then for an rx to be sent to [facility] for a 90 day supply.

## 2017-07-06 ENCOUNTER — Telehealth: Payer: Self-pay | Admitting: Cardiovascular Disease

## 2017-07-06 MED ORDER — RIVAROXABAN 20 MG PO TABS: 20.0000 mg | ORAL_TABLET | Freq: Every day | ORAL | 0 refills | Status: DC

## 2017-07-06 NOTE — Telephone Encounter (Signed)
30 DAYS AND 90 DAYS PRESCRIPTIONS SENT ON 07/03/2017   Will send 30 day supply to [facility] again.

## 2017-07-06 NOTE — Telephone Encounter (Signed)
New message     *STAT* If patient is at the pharmacy, call can be transferred to refill team.   1. Which medications need to be refilled? (please list name of each medication and dose if known) Xarelto  2. Which pharmacy/location (including street and city if local pharmacy) is medication to be sent to?[facility] - [place], [address]  3. Do they need a 30 day or 90 day supply? [place]

## 2017-07-07 NOTE — Addendum Note (Signed)
Addended by: Patria Mane A on: 07/07/2017 11:21 AM   Modules accepted: Orders

## 2017-07-28 ENCOUNTER — Telehealth (HOSPITAL_COMMUNITY): Payer: Self-pay | Admitting: Pharmacist

## 2017-07-28 ENCOUNTER — Other Ambulatory Visit: Payer: Self-pay | Admitting: Family Medicine

## 2017-07-28 DIAGNOSIS — R0789 Other chest pain: Secondary | ICD-10-CM

## 2017-07-28 NOTE — Telephone Encounter (Signed)
Cardiac Rehab Medication Review by a Pharmacist  Does the patient feel that his/her medications are working for him/her?  yes  Has the patient been experiencing any side effects to the medications prescribed?  Yes - GI upset worsened with Nitrostat. Also flushing with niacin.  Does the patient measure his/her own blood pressure or blood glucose at home?  no   Does the patient have any problems obtaining medications due to transportation or finances?   Yes - Xarelto copay is $500, unclear if this is one-time thing to meet deductible or this will always be high copay  Understanding of regimen: good Understanding of indications: good Potential of compliance: good    Pharmacist comments: Jose Warner is a pleasant 73 YOM who endorses compliance with his medications. He reports his primary doctor switched his simvastatin to pravastatin yesterday, and he will pick up the new med today. He does report some issues with gastritis and non-cardiac chest pain; per patient, his PCP is planning a CT scan to help determine the etiology.  He also reported flushing with niacin. He does not take aspirin due to bleeding risk with clopidogrel and rivaroxaban. I did not recommend pretreatment with aspirin or ibuprofen due to this risk of bleeding. He also is having an issue obtaining his Xarelto prescription, his copay with [facility] was $500 for 90 days. I am not sure if this is because of a deductible or if this will always be his copay, I encouraged him to contact his insurance company and his prescriber to find an alternative option or payment assistance.    Charlene Brooke, PharmD PGY1 Pharmacy Resident Email: [email or web address] Pager: [phone] 07/28/2017 1:22 PM

## 2017-07-30 ENCOUNTER — Encounter (HOSPITAL_COMMUNITY): Payer: Self-pay

## 2017-07-30 ENCOUNTER — Encounter (HOSPITAL_COMMUNITY)
Admission: RE | Admit: 2017-07-30 | Discharge: 2017-07-30 | Disposition: A | Discharge status: Home / Self Care | Payer: Medicare Other | Source: Ambulatory Visit | Attending: Cardiovascular Disease | Admitting: Cardiovascular Disease

## 2017-07-30 VITALS — Ht 68.0 in | Wt 174.4 lb

## 2017-07-30 DIAGNOSIS — Z955 Presence of coronary angioplasty implant and graft: Secondary | ICD-10-CM | POA: Insufficient documentation

## 2017-07-30 NOTE — Addendum Note (Signed)
Encounter addended by: Noel Christmas, RN on: 07/30/2017 11:09 AM  Actions taken: Vitals modified, Visit Navigator Flowsheet section accepted, Sign clinical note

## 2017-07-30 NOTE — Progress Notes (Signed)
Jose Warner 67 y.o. male DOB: 08/27/1950 MRN: [id number]      Nutrition Note  1. Status post coronary artery stent placement    Past Medical History:  Diagnosis Date  . Anemia   . Atrial fibrillation ([place]) 06/05/2017  . Bronchitis 05-10-12   past fall- 4 runs antibiotics due to bronchitis-uses [employer] as needed  . Celiac disease   . Colon polyps    adenomatous  . Coronary artery disease   . Diverticulosis   . GERD (gastroesophageal reflux disease)   . Hepatitis 05-10-12   "was told non A, non B"-unclean dental equip.  Marland Kitchen Hyperlipidemia   . Iron deficiency anemia   . Prostate cancer ([place]) 05-10-12   ;bx. 04-12-12, dx  . Spasm of esophagus 2013   Meds reviewed. MVI and Vitamin D daily.  HT: Ht Readings from Last 1 Encounters:  07/02/17 5\' 8"  (1.727 m)    WT: Wt Readings from Last 5 Encounters:  07/02/17 172 lb 3.2 oz (78.1 kg)  06/15/17 166 lb 3.2 oz (75.4 kg)  06/12/17 166 lb 14.2 oz (75.7 kg)  06/05/17 170 lb (77.1 kg)  06/02/17 162 lb (73.5 kg)     BMI: 26.18   Current tobacco use? No, never a smoker.       Labs:  Lipid Panel     Component Value Date/Time   CHOL 122 06/10/2017 0108   TRIG 103 06/10/2017 0108   HDL 41 06/10/2017 0108   CHOLHDL 3.0 06/10/2017 0108   VLDL 21 06/10/2017 0108   LDLCALC 60 06/10/2017 0108    Lab Results  Component Value Date   HGBA1C 6.1 (H) 06/10/2017   CBG (last 3)  No results for input(s): GLUCAP in the last 72 hours.  Nutrition Note Spoke with pt. Nutrition plan and goals reviewed with pt. Pt is not currently following a heart healthy diet. Pt has Celiac Disease and is compliant with his gluten-free diet. Pt wants to lose wt. Pt has been trying to lose wt by restricting calories (reports he tries to eat between 1200-1300 kcal/day) and walking. Pt reports that prior to his heart event, he had a 20 lb wt loss and would like to lose  An additional 10 lb from his current weight. Wt loss tips reviewed. Per discussion, pt eats  out often (6x per week). Pt expressed understanding of the information reviewed. Pt aware of nutrition education classes offered and plans on attending nutrition classes.  Nutrition Diagnosis ? Food-and nutrition-related knowledge deficit related to lack of exposure to information as related to diagnosis of: ? CVD  ? Overweight related to excessive energy intake as evidenced by a BMI of 26.18.  Nutrition Intervention ? Pt's individual nutrition plan and goals reviewed with pt.  Nutrition Goal(s):  ? Pt to identify food quantities necessary to achieve weight loss of 6-24 lb (2.7-10.9 kg) at graduation from cardiac rehab. Goal wt of 10 lb desired. Goal weight of 160 lb. ? Pt to describe the potential benefits of adopting Therapeutic Lifestyle Changes ? Pt to describe the benefit of including fruits, vegetables, whole grains, and low-fat dairy products in a heart healthy meal plan.  Plan:  Pt to attend nutrition classes ? Nutrition I ? Nutrition II ? Portion Distortion  Will provide client-centered nutrition education as part of interdisciplinary care.   Monitor and evaluate progress toward nutrition goal with team.  Ranell Patrick, Dietetic Intern 07/30/2017 9:58 AM

## 2017-07-30 NOTE — Progress Notes (Signed)
Cardiac Individual Treatment Plan  Patient Details  Name: Jose Warner MRN: [id number] Date of Birth: February 28, 1951 Referring Provider:     CARDIAC REHAB PHASE II ORIENTATION from 07/30/2017 in [place]  Referring Provider  Shelva Majestic, MD      Initial Encounter Date:    CARDIAC REHAB PHASE II ORIENTATION from 07/30/2017 in [place]  Date  07/30/17  Referring Provider  Shelva Majestic, MD      Visit Diagnosis: Status post coronary artery stent placement  Patient's Home Medications on Admission:  Current Outpatient Medications:  .  bisoprolol (ZEBETA) 5 MG tablet, Take 0.5 tablets (2.5 mg total) by mouth daily., Disp: 30 tablet, Rfl: 0 .  budesonide-formoterol (SYMBICORT) 160-4.5 MCG/ACT inhaler, Inhale 2 puffs into the lungs 2 (two) times daily as needed (for cough). , Disp: , Rfl:  .  clopidogrel (PLAVIX) 75 MG tablet, Take 1 tablet (75 mg total) by mouth daily., Disp: 90 tablet, Rfl: 3 .  guaiFENesin (MUCINEX) 600 MG 12 hr tablet, Take 600 mg by mouth 2 (two) times daily as needed., Disp: , Rfl:  .  Multiple Vitamins-Minerals (MULTIVITAMIN PO), Take 1 tablet by mouth at bedtime. , Disp: , Rfl:  .  niacin (NIASPAN) 1000 MG CR tablet, Take 1 tablet by mouth at bedtime. , Disp: , Rfl:  .  nitroGLYCERIN (NITROSTAT) 0.4 MG SL tablet, Place 1 tablet (0.4 mg total) under the tongue every 5 (five) minutes x 3 doses as needed for chest pain., Disp: 30 tablet, Rfl: 0 .  pantoprazole (PROTONIX) 40 MG tablet, at bedtime., Disp: , Rfl:  .  PROAIR HFA 108 (90 BASE) MCG/ACT inhaler, Take 2 puffs by mouth 2 (two) times daily as needed. For shortness of breath., Disp: , Rfl:  .  rivaroxaban (XARELTO) 20 MG TABS tablet, Take 1 tablet (20 mg total) by mouth daily with supper., Disp: 90 tablet, Rfl: 0 .  rivaroxaban (XARELTO) 20 MG TABS tablet, Take 1 tablet (20 mg total) by mouth daily with supper., Disp: 30 tablet, Rfl: 0 .  Vitamin  D, Ergocalciferol, 2000 units CAPS, Take 1 capsule by mouth at bedtime. , Disp: , Rfl:   Past Medical History: Past Medical History:  Diagnosis Date  . Anemia   . Atrial fibrillation ([place]) 06/05/2017  . Bronchitis 05-10-12   past fall- 4 runs antibiotics due to bronchitis-uses [employer] as needed  . Celiac disease   . Colon polyps    adenomatous  . Coronary artery disease   . Diverticulosis   . GERD (gastroesophageal reflux disease)   . Hepatitis 05-10-12   "was told non A, non B"-unclean dental equip.  [facility] Hyperlipidemia   . Iron deficiency anemia   . Prostate cancer ([place]) 05-10-12   ;bx. 04-12-12, dx  . Spasm of esophagus 2013    Tobacco Use: Social History   Tobacco Use  Smoking Status Never Smoker  Smokeless Tobacco Never Used    Labs: Recent Review Scientist, physiological    Labs for ITP Cardiac and Pulmonary Rehab Latest Ref Rng & Units 06/10/2017   Cholestrol 0 - 200 mg/dL 122   LDLCALC 0 - 99 mg/dL 60   HDL >40 mg/dL 41   Trlycerides <150 mg/dL 103   Hemoglobin A1c 4.8 - 5.6 % 6.1(H)      Capillary Blood Glucose: No results found for: GLUCAP   Exercise Target Goals: Date: 07/30/17  Exercise Program Goal: Individual exercise prescription set using results  from initial 6 min walk test and THRR while considering  patient's activity barriers and safety.   Exercise Prescription Goal: Initial exercise prescription builds to 30-45 minutes a day of aerobic activity, 2-3 days per week.  Home exercise guidelines will be given to patient during program as part of exercise prescription that the participant will acknowledge.  Activity Barriers & Risk Stratification: Activity Barriers & Cardiac Risk Stratification - 07/30/17 0957      Activity Barriers & Cardiac Risk Stratification   Activity Barriers  Other (comment)    Comments  R knee discomfort    Cardiac Risk Stratification  Moderate       6 Minute Walk: 6 Minute Walk    Row Name 07/30/17 0954         6 Minute  Walk   Phase  Initial     Distance  2160 feet     Walk Time  6 minutes     # of Rest Breaks  0     MPH  4.1     METS  4.8     RPE  12     VO2 Peak  16.8     Symptoms  No     Resting HR  67 bpm     Resting BP  110/60     Resting Oxygen Saturation   99 %     Exercise Oxygen Saturation  during 6 min walk  99 %     Max Ex. HR  105 bpm     Max Ex. BP  132/60     2 Minute Post BP  118/72        Oxygen Initial Assessment:   Oxygen Re-Evaluation:   Oxygen Discharge (Final Oxygen Re-Evaluation):   Initial Exercise Prescription: Initial Exercise Prescription - 07/30/17 1000      Date of Initial Exercise RX and Referring Provider   Date  07/30/17    Referring Provider  Shelva Majestic, MD      Treadmill   MPH  3    Grade  2    Minutes  10    METs  4.12      Bike   Level  1.2    Minutes  10    METs  3.83      NuStep   Level  4    SPM  80    Minutes  10    METs  3.5      Prescription Details   Frequency (times per week)  3    Duration  Progress to 30 minutes of continuous aerobic without signs/symptoms of physical distress      Intensity   THRR 40-80% of Max Heartrate  62-124    Ratings of Perceived Exertion  11-13    Perceived Dyspnea  0-4      Progression   Progression  Continue to progress workloads to maintain intensity without signs/symptoms of physical distress.      Resistance Training   Training Prescription  Yes    Weight  5lbs    Reps  10-15       Perform Capillary Blood Glucose checks as needed.  Exercise Prescription Changes:   Exercise Comments:   Exercise Goals and Review: Exercise Goals    Row Name 07/30/17 0857             Exercise Goals   Increase Physical Activity  Yes       Intervention  Provide advice, education, support and counseling about  physical activity/exercise needs.;Develop an individualized exercise prescription for aerobic and resistive training based on initial evaluation findings, risk stratification,  comorbidities and participant's personal goals.       Expected Outcomes  Short Term: Attend rehab on a regular basis to increase amount of physical activity.;Long Term: Exercising regularly at least 3-5 days a week.       Increase Strength and Stamina  Yes       Intervention  Provide advice, education, support and counseling about physical activity/exercise needs.;Develop an individualized exercise prescription for aerobic and resistive training based on initial evaluation findings, risk stratification, comorbidities and participant's personal goals.       Expected Outcomes  Short Term: Increase workloads from initial exercise prescription for resistance, speed, and METs.;Short Term: Perform resistance training exercises routinely during rehab and add in resistance training at home;Long Term: Improve cardiorespiratory fitness, muscular endurance and strength as measured by increased METs and functional capacity (6MWT)       Able to understand and use rate of perceived exertion (RPE) scale  Yes       Intervention  Provide education and explanation on how to use RPE scale       Expected Outcomes  Short Term: Able to use RPE daily in rehab to express subjective intensity level;Long Term:  Able to use RPE to guide intensity level when exercising independently       Knowledge and understanding of Target Heart Rate Range (THRR)  Yes       Intervention  Provide education and explanation of THRR including how the numbers were predicted and where they are located for reference       Expected Outcomes  Short Term: Able to state/look up THRR;Long Term: Able to use THRR to govern intensity when exercising independently;Short Term: Able to use daily as guideline for intensity in rehab       Able to check pulse independently  Yes       Intervention  Provide education and demonstration on how to check pulse in carotid and radial arteries.;Review the importance of being able to check your own pulse for safety during  independent exercise       Expected Outcomes  Short Term: Able to explain why pulse checking is important during independent exercise;Long Term: Able to check pulse independently and accurately       Understanding of Exercise Prescription  Yes       Intervention  Provide education, explanation, and written materials on patient's individual exercise prescription       Expected Outcomes  Short Term: Able to explain program exercise prescription;Long Term: Able to explain home exercise prescription to exercise independently          Exercise Goals Re-Evaluation :    Discharge Exercise Prescription (Final Exercise Prescription Changes):   Nutrition:  Target Goals: Understanding of nutrition guidelines, daily intake of sodium 1500mg , cholesterol 200mg , calories 30% from fat and 7% or less from saturated fats, daily to have 5 or more servings of fruits and vegetables.  Biometrics: Pre Biometrics - 07/30/17 1018      Pre Biometrics   Height  5\' 8"  (1.727 m)    Weight  174 lb 6.1 oz (79.1 kg)    Waist Circumference  38 inches    Hip Circumference  39.75 inches    Waist to Hip Ratio  0.96 %    BMI (Calculated)  26.52    Triceps Skinfold  21 mm    % Body Fat  27.5 %  Grip Strength  42 kg    Flexibility  13 in    Single Leg Stand  30 seconds        Nutrition Therapy Plan and Nutrition Goals: Nutrition Therapy & Goals - 07/30/17 1014      Nutrition Therapy   Diet  Heart Healthy, Gluten Free      Personal Nutrition Goals   Nutrition Goal  Pt to identify food quantities necessary to achieve weight loss of 6-24 lb (2.7-10.9 kg) at graduation from cardiac rehab. Goal wt of 10 lb desired. Goal weight of 160 lb.    Personal Goal #2  Pt to describe the potential benefits of adopting Therapeutic Lifestyle Changes    Personal Goal #3  Pt to describe the benefit of including fruits, vegetables, whole grains, and low-fat dairy products in a heart healthy meal plan.      Intervention Plan    Intervention  Prescribe, educate and counsel regarding individualized specific dietary modifications aiming towards targeted core components such as weight, hypertension, lipid management, diabetes, heart failure and other comorbidities.    Expected Outcomes  Short Term Goal: Understand basic principles of dietary content, such as calories, fat, sodium, cholesterol and nutrients.;Long Term Goal: Adherence to prescribed nutrition plan.       Nutrition Assessments: Nutrition Assessments - 07/30/17 1015      MEDFICTS Scores   Pre Score  61       Nutrition Goals Re-Evaluation:   Nutrition Goals Re-Evaluation:   Nutrition Goals Discharge (Final Nutrition Goals Re-Evaluation):   Psychosocial: Target Goals: Acknowledge presence or absence of significant depression and/or stress, maximize coping skills, provide positive support system. Participant is able to verbalize types and ability to use techniques and skills needed for reducing stress and depression.  Initial Review & Psychosocial Screening: Initial Psych Review & Screening - 07/30/17 1102      Initial Review   Current issues with  None Identified      Family Dynamics   Good Support System?  Yes Pt states that he has the support of his spouse, family, and friends.       Barriers   Psychosocial barriers to participate in program  There are no identifiable barriers or psychosocial needs.      Screening Interventions   Interventions  Encouraged to exercise    Expected Outcomes  Short Term goal: Identification and review with participant of any Quality of Life or Depression concerns found by scoring the questionnaire.;Long Term goal: The participant improves quality of Life and PHQ9 Scores as seen by post scores and/or verbalization of changes       Quality of Life Scores: Quality of Life - 07/30/17 1042      Quality of Life Scores   Health/Function Pre  25.4 %    Socioeconomic Pre  27.19 %    Psych/Spiritual Pre  25.21  %    Family Pre  29.5 %    GLOBAL Pre  26.36 %      Scores of 19 and below usually indicate a poorer quality of life in these areas.  A difference of  2-3 points is a clinically meaningful difference.  A difference of 2-3 points in the total score of the Quality of Life Index has been associated with significant improvement in overall quality of life, self-image, physical symptoms, and general health in studies assessing change in quality of life.  PHQ-9: Recent Review Flowsheet Data    There is no flowsheet data to display.  Interpretation of Total Score  Total Score Depression Severity:  1-4 = Minimal depression, 5-9 = Mild depression, 10-14 = Moderate depression, 15-19 = Moderately severe depression, 20-27 = Severe depression   Psychosocial Evaluation and Intervention:   Psychosocial Re-Evaluation:   Psychosocial Discharge (Final Psychosocial Re-Evaluation):   Vocational Rehabilitation: Provide vocational rehab assistance to qualifying candidates.   Vocational Rehab Evaluation & Intervention: Vocational Rehab - 07/30/17 1059      Initial Vocational Rehab Evaluation & Intervention   Assessment shows need for Vocational Rehabilitation  No       Education: Education Goals: Education classes will be provided on a weekly basis, covering required topics. Participant will state understanding/return demonstration of topics presented.  Learning Barriers/Preferences: Learning Barriers/Preferences - 07/30/17 0842      Learning Barriers/Preferences   Learning Barriers  Sight    Learning Preferences  Skilled Demonstration;Written Material;Computer/Internet       Education Topics: Count Your Pulse:  -Group instruction provided by verbal instruction, demonstration, patient participation and written materials to support subject.  Instructors address importance of being able to find your pulse and how to count your pulse when at home without a heart monitor.  Patients get hands  on experience counting their pulse with staff help and individually.   Heart Attack, Angina, and Risk Factor Modification:  -Group instruction provided by verbal instruction, video, and written materials to support subject.  Instructors address signs and symptoms of angina and heart attacks.    Also discuss risk factors for heart disease and how to make changes to improve heart health risk factors.   Functional Fitness:  -Group instruction provided by verbal instruction, demonstration, patient participation, and written materials to support subject.  Instructors address safety measures for doing things around the house.  Discuss how to get up and down off the floor, how to pick things up properly, how to safely get out of a chair without assistance, and balance training.   Meditation and Mindfulness:  -Group instruction provided by verbal instruction, patient participation, and written materials to support subject.  Instructor addresses importance of mindfulness and meditation practice to help reduce stress and improve awareness.  Instructor also leads participants through a meditation exercise.    Stretching for Flexibility and Mobility:  -Group instruction provided by verbal instruction, patient participation, and written materials to support subject.  Instructors lead participants through series of stretches that are designed to increase flexibility thus improving mobility.  These stretches are additional exercise for major muscle groups that are typically performed during regular warm up and cool down.   Hands Only CPR:  -Group verbal, video, and participation provides a basic overview of AHA guidelines for community CPR. Role-play of emergencies allow participants the opportunity to practice calling for help and chest compression technique with discussion of AED use.   Hypertension: -Group verbal and written instruction that provides a basic overview of hypertension including the most  recent diagnostic guidelines, risk factor reduction with self-care instructions and medication management.    Nutrition I class: Heart Healthy Eating:  -Group instruction provided by PowerPoint slides, verbal discussion, and written materials to support subject matter. The instructor gives an explanation and review of the Therapeutic Lifestyle Changes diet recommendations, which includes a discussion on lipid goals, dietary fat, sodium, fiber, plant stanol/sterol esters, sugar, and the components of a well-balanced, healthy diet.   Nutrition II class: Lifestyle Skills:  -Group instruction provided by PowerPoint slides, verbal discussion, and written materials to support subject matter. The instructor gives  an explanation and review of label reading, grocery shopping for heart health, heart healthy recipe modifications, and ways to make healthier choices when eating out.   Diabetes Question & Answer:  -Group instruction provided by PowerPoint slides, verbal discussion, and written materials to support subject matter. The instructor gives an explanation and review of diabetes co-morbidities, pre- and post-prandial blood glucose goals, pre-exercise blood glucose goals, signs, symptoms, and treatment of hypoglycemia and hyperglycemia, and foot care basics.   Diabetes Blitz:  -Group instruction provided by PowerPoint slides, verbal discussion, and written materials to support subject matter. The instructor gives an explanation and review of the physiology behind type 1 and type 2 diabetes, diabetes medications and rational behind using different medications, pre- and post-prandial blood glucose recommendations and Hemoglobin A1c goals, diabetes diet, and exercise including blood glucose guidelines for exercising safely.    Portion Distortion:  -Group instruction provided by PowerPoint slides, verbal discussion, written materials, and food models to support subject matter. The instructor gives an  explanation of serving size versus portion size, changes in portions sizes over the last 20 years, and what consists of a serving from each food group.   Stress Management:  -Group instruction provided by verbal instruction, video, and written materials to support subject matter.  Instructors review role of stress in heart disease and how to cope with stress positively.     Exercising on Your Own:  -Group instruction provided by verbal instruction, power point, and written materials to support subject.  Instructors discuss benefits of exercise, components of exercise, frequency and intensity of exercise, and end points for exercise.  Also discuss use of nitroglycerin and activating EMS.  Review options of places to exercise outside of rehab.  Review guidelines for sex with heart disease.   Cardiac Drugs I:  -Group instruction provided by verbal instruction and written materials to support subject.  Instructor reviews cardiac drug classes: antiplatelets, anticoagulants, beta blockers, and statins.  Instructor discusses reasons, side effects, and lifestyle considerations for each drug class.   Cardiac Drugs II:  -Group instruction provided by verbal instruction and written materials to support subject.  Instructor reviews cardiac drug classes: angiotensin converting enzyme inhibitors (ACE-I), angiotensin II receptor blockers (ARBs), nitrates, and calcium channel blockers.  Instructor discusses reasons, side effects, and lifestyle considerations for each drug class.   Anatomy and Physiology of the Circulatory System:  Group verbal and written instruction and models provide basic cardiac anatomy and physiology, with the coronary electrical and arterial systems. Review of: AMI, Angina, Valve disease, Heart Failure, Peripheral Artery Disease, Cardiac Arrhythmia, Pacemakers, and the ICD.   Other Education:  -Group or individual verbal, written, or video instructions that support the educational  goals of the cardiac rehab program.   Holiday Eating Survival Tips:  -Group instruction provided by PowerPoint slides, verbal discussion, and written materials to support subject matter. The instructor gives patients tips, tricks, and techniques to help them not only survive but enjoy the holidays despite the onslaught of food that accompanies the holidays.   Knowledge Questionnaire Score: Knowledge Questionnaire Score - 07/30/17 1038      Knowledge Questionnaire Score   Pre Score  19/24       Core Components/Risk Factors/Patient Goals at Admission: Personal Goals and Risk Factors at Admission - 07/30/17 0858      Core Components/Risk Factors/Patient Goals on Admission    Weight Management  Yes;Weight Maintenance;Weight Loss    Intervention  Weight Management: Develop a combined nutrition and exercise program designed  to reach desired caloric intake, while maintaining appropriate intake of nutrient and fiber, sodium and fats, and appropriate energy expenditure required for the weight goal.;Weight Management: Provide education and appropriate resources to help participant work on and attain dietary goals.;Weight Management/Obesity: Establish reasonable short term and long term weight goals.    Admit Weight  174 lb 6.1 oz (79.1 kg)    Goal Weight: Short Term  164 lb (74.4 kg)    Goal Weight: Long Term  164 lb (74.4 kg)    Expected Outcomes  Short Term: Continue to assess and modify interventions until short term weight is achieved;Weight Maintenance: Understanding of the daily nutrition guidelines, which includes 25-35% calories from fat, 7% or less cal from saturated fats, less than 200mg  cholesterol, less than 1.5gm of sodium, & 5 or more servings of fruits and vegetables daily;Weight Loss: Understanding of general recommendations for a balanced deficit meal plan, which promotes 1-2 lb weight loss per week and includes a negative energy balance of [phone] kcal/d;Understanding recommendations  for meals to include 15-35% energy as protein, 25-35% energy from fat, 35-60% energy from carbohydrates, less than 200mg  of dietary cholesterol, 20-35 gm of total fiber daily;Understanding of distribution of calorie intake throughout the day with the consumption of 4-5 meals/snacks    Lipids  Yes    Intervention  Provide education and support for participant on nutrition & aerobic/resistive exercise along with prescribed medications to achieve LDL 70mg , HDL >40mg .    Expected Outcomes  Short Term: Participant states understanding of desired cholesterol values and is compliant with medications prescribed. Participant is following exercise prescription and nutrition guidelines.;Long Term: Cholesterol controlled with medications as prescribed, with individualized exercise RX and with personalized nutrition plan. Value goals: LDL < 70mg , HDL > 40 mg.       Core Components/Risk Factors/Patient Goals Review:    Core Components/Risk Factors/Patient Goals at Discharge (Final Review):    ITP Comments: ITP Comments    Row Name 07/30/17 0838           ITP Comments  Dr. Fransico Him, Medical Director          Comments: Patient attended orientation from [phone] to 1005 to review rules and guidelines for program. Completed 6 minute walk test, Intitial ITP, and exercise prescription.  VSS. Telemetry-SR.  Asymptomatic. Pt tolerated exercise well. No psychosocial needs identified. No interventions necessary.

## 2017-07-31 NOTE — Addendum Note (Signed)
Encounter addended by: Jewel Baize, RD on: 07/31/2017 8:43 AM  Actions taken: Visit Navigator Flowsheet section accepted, Cosign clinical note with attestation

## 2017-08-03 ENCOUNTER — Encounter (HOSPITAL_COMMUNITY): Payer: Medicare Other

## 2017-08-03 ENCOUNTER — Ambulatory Visit
Admission: RE | Admit: 2017-08-03 | Discharge: 2017-08-03 | Disposition: A | Discharge status: Home / Self Care | Payer: Medicare Other | Source: Ambulatory Visit | Attending: Family Medicine | Admitting: Family Medicine

## 2017-08-03 ENCOUNTER — Encounter (HOSPITAL_COMMUNITY)
Admission: RE | Admit: 2017-08-03 | Discharge: 2017-08-03 | Disposition: A | Discharge status: Home / Self Care | Payer: Medicare Other | Source: Ambulatory Visit | Attending: Cardiovascular Disease | Admitting: Cardiovascular Disease

## 2017-08-03 DIAGNOSIS — Z955 Presence of coronary angioplasty implant and graft: Secondary | ICD-10-CM

## 2017-08-03 DIAGNOSIS — R0789 Other chest pain: Secondary | ICD-10-CM

## 2017-08-03 NOTE — Progress Notes (Signed)
Daily Session Note  Patient Details  Name: Jose Warner MRN: [id number] Date of Birth: 04-03-1951 Referring Provider:     CARDIAC REHAB PHASE II ORIENTATION from 07/30/2017 in [place]  Referring Provider  Shelva Majestic, MD      Encounter Date: 08/03/2017  Check In: Session Check In - 08/03/17 0917      Check-In   Location  [facility]    Staff Present  Beverley Fiedler, MS, ACSM CEP, Exercise Physiologist;Tyara Carol Ada, MS,ACSM CEP, Exercise Physiologist;Amber Fair, MS, ACSM RCEP, Exercise Physiologist;Lun Muro Wilber Oliphant, RN, BSN    Supervising physician immediately available to respond to emergencies  [facility] immediately available    Physician(s)  Dr. Alfredia Ferguson    Medication changes reported      No    Fall or balance concerns reported     No    Tobacco Cessation  No Change    Warm-up and Cool-down  Performed as group-led instruction    Resistance Training Performed  Yes    VAD Patient?  No      Pain Assessment   Currently in Pain?  No/denies    Multiple Pain Sites  No       Capillary Blood Glucose: No results found for this or any previous visit (from the past 24 hour(s)).    Social History   Tobacco Use  Smoking Status Never Smoker  Smokeless Tobacco Never Used    Goals Met:  Exercise tolerated well Personal goals reviewed No report of cardiac concerns or symptoms  Goals Unmet:  Not Applicable  Comments:  Pt started cardiac rehab today.  Pt tolerated light exercise without difficulty. VSS, telemetry-SR with no noted ectopy. Pt is  asymptomatic.  Medication list reconciled. Pt denies barriers to medication compliance.  PSYCHOSOCIAL ASSESSMENT:  PHQ-0. Pt exhibits positive coping skills, hopeful outlook with supportive family. No psychosocial needs identified at this time, no psychosocial interventions necessary.    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   Pt oriented  to exercise equipment and routine.    Understanding verbalized. Maurice Small RN, BSN Cardiac and Pulmonary Rehab Nurse Navigator      Dr. Fransico Him is Medical Director for Cardiac Rehab at [facility].

## 2017-08-05 ENCOUNTER — Encounter (HOSPITAL_COMMUNITY): Payer: Medicare Other

## 2017-08-05 ENCOUNTER — Encounter (HOSPITAL_COMMUNITY): Payer: Self-pay

## 2017-08-07 ENCOUNTER — Encounter (HOSPITAL_COMMUNITY)
Admission: RE | Admit: 2017-08-07 | Discharge: 2017-08-07 | Disposition: A | Discharge status: Home / Self Care | Payer: Medicare Other | Source: Ambulatory Visit | Attending: Cardiovascular Disease | Admitting: Cardiovascular Disease

## 2017-08-07 ENCOUNTER — Encounter (HOSPITAL_COMMUNITY): Payer: Medicare Other

## 2017-08-07 DIAGNOSIS — Z955 Presence of coronary angioplasty implant and graft: Secondary | ICD-10-CM

## 2017-08-07 NOTE — Progress Notes (Signed)
Jose Warner 67 y.o. male Nutrition Note Spoke with pt. Nutrition Plan and Nutrition Survey goals reviewed with pt. Pt is not currently following a Heart Healthy diet, but was receptive to nutrition advice and suggested modifications. Pt wants to lose wt and has a goal of 160 lbs by the end of cardiac rehab. According to last HgbA1c (6.1% 2/19) pt is pre-diabetic. Pt states he is aware it is higher than desired and is having it rechecked in June, will follow-up with results and provide additional education if needed. Pt expressed understanding of the information reviewed. Pt aware of nutrition education classes offered.  Lab Results  Component Value Date   HGBA1C 6.1 (H) 06/10/2017   Wt Readings from Last 5 Encounters:  07/30/17 174 lb 6.1 oz (79.1 kg)  07/02/17 172 lb 3.2 oz (78.1 kg)  06/15/17 166 lb 3.2 oz (75.4 kg)  06/12/17 166 lb 14.2 oz (75.7 kg)  06/05/17 170 lb (77.1 kg)   Nutrition Diagnosis ? Food-and nutrition-related knowledge deficit related to lack of exposure to information as related to diagnosis of: ? CVD ? Pre-DM ? Overweight related to excessive energy intake as evidenced by a BMI of 26.5  Nutrition Intervention ? Pt's individual nutrition plan reviewed with pt. ? Benefits of adopting Heart Healthy diet discussed when Medficts reviewed.   ? Continue client-centered nutrition education by RD, as part of interdisciplinary care.  Goal(s) ? Pt to identify food quantities necessary to achieve weight loss of 6-10 lb at graduation from cardiac rehab.  ? Pt to describe the benefit of including fruits, vegetables, whole grains, and low-fat dairy products in a heart healthy meal plan. ? Pt able to name foods that affect blood glucose   Jose Warner, Dietetic Intern 08/07/2017 9:08 AM

## 2017-08-10 ENCOUNTER — Encounter (HOSPITAL_COMMUNITY): Payer: Medicare Other

## 2017-08-10 ENCOUNTER — Encounter (HOSPITAL_COMMUNITY)
Admission: RE | Admit: 2017-08-10 | Discharge: 2017-08-10 | Disposition: A | Discharge status: Home / Self Care | Payer: Medicare Other | Source: Ambulatory Visit | Attending: Cardiovascular Disease | Admitting: Cardiovascular Disease

## 2017-08-10 DIAGNOSIS — Z955 Presence of coronary angioplasty implant and graft: Secondary | ICD-10-CM | POA: Diagnosis not present

## 2017-08-12 ENCOUNTER — Encounter (HOSPITAL_COMMUNITY): Payer: Medicare Other

## 2017-08-14 ENCOUNTER — Encounter (HOSPITAL_COMMUNITY): Payer: Medicare Other

## 2017-08-17 ENCOUNTER — Emergency Department (HOSPITAL_COMMUNITY): Payer: Medicare Other

## 2017-08-17 ENCOUNTER — Observation Stay (HOSPITAL_COMMUNITY)
Admission: EM | Admit: 2017-08-17 | Discharge: 2017-08-18 | Disposition: A | Discharge status: Home / Self Care | Payer: Medicare Other | Attending: Cardiovascular Disease | Admitting: Cardiovascular Disease

## 2017-08-17 ENCOUNTER — Encounter (HOSPITAL_COMMUNITY): Payer: Medicare Other

## 2017-08-17 ENCOUNTER — Ambulatory Visit (HOSPITAL_COMMUNITY)
Admission: RE | Admit: 2017-08-17 | Discharge: 2017-08-17 | Disposition: A | Discharge status: Home / Self Care | Payer: Medicare Other | Source: Ambulatory Visit | Attending: Family Medicine | Admitting: Family Medicine

## 2017-08-17 ENCOUNTER — Telehealth: Payer: Self-pay | Admitting: Cardiology

## 2017-08-17 ENCOUNTER — Encounter (HOSPITAL_COMMUNITY): Payer: Self-pay | Admitting: Emergency Medicine

## 2017-08-17 ENCOUNTER — Encounter (HOSPITAL_COMMUNITY)
Admission: RE | Admit: 2017-08-17 | Discharge: 2017-08-17 | Disposition: A | Discharge status: Home / Self Care | Payer: Medicare Other | Source: Ambulatory Visit | Attending: Cardiovascular Disease | Admitting: Cardiovascular Disease

## 2017-08-17 DIAGNOSIS — I200 Unstable angina: Secondary | ICD-10-CM | POA: Diagnosis present

## 2017-08-17 DIAGNOSIS — Z79899 Other long term (current) drug therapy: Secondary | ICD-10-CM | POA: Insufficient documentation

## 2017-08-17 DIAGNOSIS — Z888 Allergy status to other drugs, medicaments and biological substances status: Secondary | ICD-10-CM | POA: Insufficient documentation

## 2017-08-17 DIAGNOSIS — Z7902 Long term (current) use of antithrombotics/antiplatelets: Secondary | ICD-10-CM | POA: Diagnosis not present

## 2017-08-17 DIAGNOSIS — K900 Celiac disease: Secondary | ICD-10-CM | POA: Insufficient documentation

## 2017-08-17 DIAGNOSIS — E786 Lipoprotein deficiency: Secondary | ICD-10-CM | POA: Diagnosis present

## 2017-08-17 DIAGNOSIS — Z8249 Family history of ischemic heart disease and other diseases of the circulatory system: Secondary | ICD-10-CM | POA: Insufficient documentation

## 2017-08-17 DIAGNOSIS — K219 Gastro-esophageal reflux disease without esophagitis: Secondary | ICD-10-CM | POA: Diagnosis present

## 2017-08-17 DIAGNOSIS — Z9861 Coronary angioplasty status: Secondary | ICD-10-CM

## 2017-08-17 DIAGNOSIS — Z955 Presence of coronary angioplasty implant and graft: Secondary | ICD-10-CM | POA: Insufficient documentation

## 2017-08-17 DIAGNOSIS — Z7901 Long term (current) use of anticoagulants: Secondary | ICD-10-CM | POA: Diagnosis not present

## 2017-08-17 DIAGNOSIS — R0789 Other chest pain: Secondary | ICD-10-CM | POA: Insufficient documentation

## 2017-08-17 DIAGNOSIS — I480 Paroxysmal atrial fibrillation: Secondary | ICD-10-CM | POA: Diagnosis not present

## 2017-08-17 DIAGNOSIS — E785 Hyperlipidemia, unspecified: Secondary | ICD-10-CM | POA: Insufficient documentation

## 2017-08-17 DIAGNOSIS — I2584 Coronary atherosclerosis due to calcified coronary lesion: Secondary | ICD-10-CM | POA: Diagnosis not present

## 2017-08-17 DIAGNOSIS — Z7951 Long term (current) use of inhaled steroids: Secondary | ICD-10-CM | POA: Insufficient documentation

## 2017-08-17 DIAGNOSIS — I2510 Atherosclerotic heart disease of native coronary artery without angina pectoris: Secondary | ICD-10-CM | POA: Diagnosis not present

## 2017-08-17 DIAGNOSIS — J45909 Unspecified asthma, uncomplicated: Secondary | ICD-10-CM | POA: Diagnosis present

## 2017-08-17 DIAGNOSIS — Z882 Allergy status to sulfonamides status: Secondary | ICD-10-CM | POA: Insufficient documentation

## 2017-08-17 DIAGNOSIS — Z8546 Personal history of malignant neoplasm of prostate: Secondary | ICD-10-CM

## 2017-08-17 LAB — BASIC METABOLIC PANEL
ANION GAP: 9 (ref 5–15)
BUN: 17 mg/dL (ref 6–20)
CALCIUM: 9.1 mg/dL (ref 8.9–10.3)
CHLORIDE: 105 mmol/L (ref 101–111)
CO2: 23 mmol/L (ref 22–32)
CREATININE: 1.09 mg/dL (ref 0.61–1.24)
GFR calc non Af Amer: >60 mL/min (ref >60)
Glucose, Bld: 112 mg/dL — ABNORMAL HIGH (ref 65–99)
Potassium: 4.2 mmol/L (ref 3.5–5.1)
SODIUM: 137 mmol/L (ref 135–145)

## 2017-08-17 LAB — CBC
HCT: 44.6 % (ref 39.0–52.0)
HEMOGLOBIN: 14.4 g/dL (ref 13.0–17.0)
MCH: 28.9 pg (ref 26.0–34.0)
MCHC: 32.3 g/dL (ref 30.0–36.0)
MCV: 89.6 fL (ref 78.0–100.0)
PLATELETS: 208 10*3/uL (ref 150–400)
RBC: 4.98 MIL/uL (ref 4.22–5.81)
RDW: 14.7 % (ref 11.5–15.5)
WBC: 5.1 10*3/uL (ref 4.0–10.5)

## 2017-08-17 LAB — TROPONIN I
Troponin I: <0.03 ng/mL (ref <0.03)
Troponin I: <0.03 ng/mL (ref <0.03)

## 2017-08-17 LAB — I-STAT TROPONIN, ED: TROPONIN I, POC: 0 ng/mL (ref 0.00–0.08)

## 2017-08-17 MED ORDER — GI COCKTAIL ~~LOC~~: 30.0000 mL | Freq: Two times a day (BID) | ORAL | Status: DC | PRN

## 2017-08-17 MED ORDER — SODIUM CHLORIDE 0.9 % WEIGHT BASED INFUSION
1.0000 mL/kg/h | INTRAVENOUS | Status: DC
  Administered 2017-08-17: 1 mL/kg/h via INTRAVENOUS

## 2017-08-17 MED ORDER — ACETAMINOPHEN 325 MG PO TABS
650.0000 mg | ORAL_TABLET | ORAL | Status: DC | PRN
  Administered 2017-08-17: 650 mg via ORAL
  Filled 2017-08-17: qty 2

## 2017-08-17 MED ORDER — SODIUM CHLORIDE 0.9% FLUSH: 3.0000 mL | INTRAVENOUS | Status: DC | PRN

## 2017-08-17 MED ORDER — BISOPROLOL FUMARATE 5 MG PO TABS
2.5000 mg | ORAL_TABLET | Freq: Every day | ORAL | Status: DC
  Administered 2017-08-18: 2.5 mg via ORAL
  Filled 2017-08-17: qty 1

## 2017-08-17 MED ORDER — ALBUTEROL SULFATE (2.5 MG/3ML) 0.083% IN NEBU: 3.0000 mL | INHALATION_SOLUTION | Freq: Two times a day (BID) | RESPIRATORY_TRACT | Status: DC | PRN

## 2017-08-17 MED ORDER — PANTOPRAZOLE SODIUM 40 MG PO TBEC
80.0000 mg | DELAYED_RELEASE_TABLET | Freq: Every day | ORAL | Status: DC
  Administered 2017-08-17: 80 mg via ORAL
  Filled 2017-08-17: qty 2

## 2017-08-17 MED ORDER — ASPIRIN 81 MG PO CHEW
81.0000 mg | CHEWABLE_TABLET | ORAL | Status: AC
  Administered 2017-08-18: 81 mg via ORAL
  Filled 2017-08-17: qty 1

## 2017-08-17 MED ORDER — NITROGLYCERIN 0.4 MG SL SUBL
0.4000 mg | SUBLINGUAL_TABLET | SUBLINGUAL | Status: DC | PRN
  Administered 2017-08-17: 0.4 mg via SUBLINGUAL
  Filled 2017-08-17: qty 1

## 2017-08-17 MED ORDER — ZOLPIDEM TARTRATE 5 MG PO TABS: 5.0000 mg | ORAL_TABLET | Freq: Every evening | ORAL | Status: DC | PRN

## 2017-08-17 MED ORDER — NIACIN ER (ANTIHYPERLIPIDEMIC) 500 MG PO TBCR
1000.0000 mg | EXTENDED_RELEASE_TABLET | Freq: Every day | ORAL | Status: DC
  Administered 2017-08-17: 1000 mg via ORAL
  Filled 2017-08-17 (×3): qty 2

## 2017-08-17 MED ORDER — PRAVASTATIN SODIUM 40 MG PO TABS
80.0000 mg | ORAL_TABLET | Freq: Every day | ORAL | Status: DC
  Administered 2017-08-17: 80 mg via ORAL
  Filled 2017-08-17: qty 2

## 2017-08-17 MED ORDER — CLOPIDOGREL BISULFATE 75 MG PO TABS
75.0000 mg | ORAL_TABLET | Freq: Every day | ORAL | Status: DC
  Administered 2017-08-18: 75 mg via ORAL
  Filled 2017-08-17: qty 1

## 2017-08-17 MED ORDER — ALPRAZOLAM 0.25 MG PO TABS: 0.2500 mg | ORAL_TABLET | Freq: Two times a day (BID) | ORAL | Status: DC | PRN

## 2017-08-17 MED ORDER — MOMETASONE FURO-FORMOTEROL FUM 200-5 MCG/ACT IN AERO
2.0000 | INHALATION_SPRAY | Freq: Two times a day (BID) | RESPIRATORY_TRACT | Status: DC
  Administered 2017-08-18: 2 via RESPIRATORY_TRACT
  Filled 2017-08-17 (×2): qty 8.8

## 2017-08-17 MED ORDER — NITROGLYCERIN IN D5W 200-5 MCG/ML-% IV SOLN
5.0000 ug/min | INTRAVENOUS | Status: DC
  Administered 2017-08-17: 5 ug/min via INTRAVENOUS
  Filled 2017-08-17: qty 250

## 2017-08-17 MED ORDER — ONDANSETRON HCL 4 MG/2ML IJ SOLN: 4.0000 mg | Freq: Four times a day (QID) | INTRAMUSCULAR | Status: DC | PRN

## 2017-08-17 MED ORDER — HEPARIN (PORCINE) IN NACL 100-0.45 UNIT/ML-% IJ SOLN
950.0000 [IU]/h | INTRAMUSCULAR | Status: DC
  Administered 2017-08-17: 950 [IU]/h via INTRAVENOUS
  Filled 2017-08-17: qty 250

## 2017-08-17 MED ORDER — NITROGLYCERIN 0.4 MG SL SUBL
0.4000 mg | SUBLINGUAL_TABLET | SUBLINGUAL | Status: DC | PRN
Start: 2017-08-17 — End: 2017-08-17

## 2017-08-17 NOTE — Progress Notes (Signed)
OUTPATIENT CARDIAC REHAB  PMH:  05/2017 DES LAD   Primary Cardiologist:  Dr. Claiborne Billings  Pt arrived at cardiac rehab c/o chest discomfort. Pt rates 2/10. Describes as pressure in center of chest. Pt states it is anginal equivalent however lesser intensity. pt pt reports similar episodes this weekend in which he took ntg x1 with relief.  12 lead EKG obtained. Cecilie Kicks, NP made aware. Pt transferred to ED via stretcher in stable condition. Pt notified his wife of transfer.  Andi Hence, RN, BSN Cardiac Pulmonary Rehab 08/17/17 9:46 AM

## 2017-08-17 NOTE — ED Notes (Signed)
Pt placed on hospital bed. New warm blankets given. Updated pt and family to delay. Pt and verbalized understanding of POC.

## 2017-08-17 NOTE — ED Notes (Signed)
Cardiology at bedside.

## 2017-08-17 NOTE — ED Notes (Signed)
ED Provider at bedside. 

## 2017-08-17 NOTE — Telephone Encounter (Signed)
Having chest pain twice this weekend and now arriving at cardiac rehab with current chest pain.  He also has bronchitis he believes but with current chest pain have asked him to go to ER.

## 2017-08-17 NOTE — Progress Notes (Signed)
ANTICOAGULATION CONSULT NOTE - Initial Consult  Pharmacy Consult for heparin Indication: chest pain/ACS  Allergies  Allergen Reactions  . Gluten Meal Other (See Comments)    celiac  . Wheat Bran Other (See Comments)    Celiac   . Statins     Leg muscle pains - rosuvastatin, atorvastatin, zetia. Tolerates simvastatin  . Sulfa Antibiotics Rash    Patient Measurements: Weight: 174 lb 2.6 oz (79 kg)  Vital Signs: Temp: 98.2 F (36.8 C) (04/29 1148) Temp Source: Oral (04/29 1148) BP: 140/67 (04/29 1415) Pulse Rate: 57 (04/29 1415)  Labs: Recent Labs    08/17/17 0911  HGB 14.4  HCT 44.6  PLT 208  CREATININE 1.09    Estimated Creatinine Clearance: 65.4 mL/min (by C-G formula based on SCr of 1.09 mg/dL).  Assessment: CC/HPI: 67 yo m sent from cardiac rehab w CP  PMH: CAD, afib, GERD, HLD  Anticoag: rivaroxaban pta for afib - last dose 1800 4/28  Heme/Onc: H&H 14.4/44.6, Plt 208  Goal of Therapy:  Heparin level 0.3-0.7 units/ml aPTT 66 - 102 seconds Monitor platelets by anticoagulation protocol: Yes   Plan:  Start heparin 950 units/hr @1800  (no bolus d/t xarelto pta) Initial HL aptt 0200 Daily HL CBC  Levester Fresh, PharmD, BCPS, BCCCP Clinical Pharmacist Clinical phone for 08/17/2017 from 7a-3:30p: [phone] If after 3:30p, please call main pharmacy at: [phone] 08/17/2017 2:19 PM

## 2017-08-17 NOTE — ED Notes (Signed)
Meal tray delivered and at bedside.

## 2017-08-17 NOTE — ED Notes (Signed)
New diet tray given to patient

## 2017-08-17 NOTE — ED Notes (Signed)
Pt placed on 2L O2 [place] per cardiology order

## 2017-08-17 NOTE — ED Notes (Signed)
Recliner moved into room for patient's spouse. Pt wife given diet coke and pt given sprite. Delay explained to patient and family. Pt comfortable at this time

## 2017-08-17 NOTE — ED Notes (Signed)
Meal tray delivered to bedside with gravy on potatoes. Pt unable to have gravy due to flour in gravy. This RN confirmed with dietary that pt is gluten free due to allergy. Kitchen to bring patient grilled chicken with no spices and potatoes with no gravy.

## 2017-08-17 NOTE — ED Provider Notes (Signed)
[facility] EMERGENCY DEPARTMENT Provider Note   CSN: [id number] Arrival date & time: 08/17/17  0857     History   Chief Complaint Chief Complaint  Patient presents with  . Chest Pain    HPI AYDIAN DIMMICK is a 67 y.o. male.  The history is provided by the patient and medical records. No language interpreter was used.  Chest Pain   Pertinent negatives include no abdominal pain, no nausea, no palpitations and no vomiting.   KIRKE BREACH is a 67 y.o. male  with a PMH of CAD s/p stent placement in February on Plavix, a.fib on Zollie Beckers presents to the Emergency Department complaining of central chest pain which began two days ago. Intermittent. Feels like a pressure - like a hand pressing on his chest. Started to have radiation up to right jaw today. He took a nitro on Saturday and one on Sunday - he feels as if this did help relieve his pain. He went to cardiac rehab this morning and informed them of his chest pain. They instructed him to come to ER for further evaluation.   Cardiologist: Dr. Claiborne Billings   Past Medical History:  Diagnosis Date  . Anemia   . Atrial fibrillation ([place]) 06/05/2017  . Bronchitis 05-10-12   past fall- 4 runs antibiotics due to bronchitis-uses [employer] as needed  . Celiac disease   . Colon polyps    adenomatous  . Coronary artery disease   . Diverticulosis   . GERD (gastroesophageal reflux disease)   . Hepatitis 05-10-12   "was told non A, non B"-unclean dental equip.  Marland Kitchen Hyperlipidemia   . Iron deficiency anemia   . Prostate cancer ([place]) 05-10-12   ;bx. 04-12-12, dx  . Spasm of esophagus 2013    Patient Active Problem List   Diagnosis Date Noted  . Chronic anticoagulation 08/17/2017  . Asthma 08/17/2017  . Unstable angina ([place]) 08/17/2017  . ACS (acute coronary syndrome) ([place]) 06/14/2017  . GERD (gastroesophageal reflux disease) 06/10/2017  . Chest pain 06/09/2017  . Erectile dysfunction following radical prostatectomy  06/08/2013  . FHx: coronary artery disease 11/26/2012  . H/O prostate cancer 11/26/2012  . Low HDL (under 40) 11/26/2012  . Paroxysmal atrial fibrillation ([place]) 11/26/2012  . CAD S/P percutaneous coronary angioplasty 11/26/2012    Past Surgical History:  Procedure Laterality Date  . CARDIAC CATHETERIZATION  09/13/2004   LAD:30%-40% in prox. to mid segment with 20% in the mid segment and 20% in left Circ., mild 20% right common iliac narrowing. medical therapy  . CARDIOVERSION  06/05/2017  . CORONARY STENT INTERVENTION N/A 06/11/2017   Procedure: CORONARY STENT INTERVENTION;  Surgeon: Belva Crome, MD;  Location: [facility];  Service: Cardiovascular;  Laterality: N/A;  . HERNIA REPAIR  5 yrs ago   right   . LEFT HEART CATH AND CORONARY ANGIOGRAPHY N/A 06/11/2017   Procedure: LEFT HEART CATH AND CORONARY ANGIOGRAPHY;  Surgeon: Belva Crome, MD;  Location: [facility];  Service: Cardiovascular;  Laterality: N/A;  . NM MYOCAR PERF WALL MOTION  11/30/2007   protocol:[place], post EF 67%,mild perfusion defect seen in basal inferior consistant with Attenuation artifact, exercise cap. 12METS  . ROBOT ASSISTED LAPAROSCOPIC RADICAL PROSTATECTOMY  05/20/2012   Procedure: ROBOTIC ASSISTED LAPAROSCOPIC RADICAL PROSTATECTOMY LEVEL 1;  Surgeon: Dutch Gray, MD;  Location: [facility];  Service: Urology;  Laterality: N/A;     . TRANSURETHRAL RESECTION OF PROSTATE  57yrs ago  Home Medications    Prior to Admission medications   Medication Sig Start Date End Date Taking? Authorizing Provider  bisoprolol (ZEBETA) 5 MG tablet Take 0.5 tablets (2.5 mg total) by mouth daily. 06/15/17 08/17/17 Yes Hoffman, Jessica Ratliff, DO  budesonide-formoterol (SYMBICORT) 160-4.5 MCG/ACT inhaler Inhale 2 puffs into the lungs 2 (two) times daily as needed (for cough).    Yes [provider]  clopidogrel (PLAVIX) 75 MG tablet Take 1 tablet (75 mg total) by mouth daily. 07/02/17  Yes Troy Sine, MD  guaiFENesin (MUCINEX) 600 MG 12 hr tablet Take 600 mg by mouth 2 (two) times daily as needed.   Yes [provider]  Multiple Vitamins-Minerals (MULTIVITAMIN PO) Take 1 tablet by mouth at bedtime.    Yes [provider]  niacin (NIASPAN) 1000 MG CR tablet Take 1 tablet by mouth at bedtime.  12/04/15  Yes [provider]  pantoprazole (PROTONIX) 40 MG tablet Take 80 mg by mouth daily.  06/24/17  Yes [provider]  pravastatin (PRAVACHOL) 80 MG tablet Take 80 mg by mouth daily.   Yes [provider]  PROAIR HFA 108 (90 BASE) MCG/ACT inhaler Take 2 puffs by mouth 2 (two) times daily as needed. For shortness of breath. 02/13/12  Yes [provider]  rivaroxaban (XARELTO) 20 MG TABS tablet Take 1 tablet (20 mg total) by mouth daily with supper. 07/03/17  Yes Troy Sine, MD  Vitamin D, Ergocalciferol, 2000 units CAPS Take 1 capsule by mouth at bedtime.    Yes [provider]  nitroGLYCERIN (NITROSTAT) 0.4 MG SL tablet Place 1 tablet (0.4 mg total) under the tongue every 5 (five) minutes x 3 doses as needed for chest pain. 06/15/17   Valinda Party, DO  rivaroxaban (XARELTO) 20 MG TABS tablet Take 1 tablet (20 mg total) by mouth daily with supper. Patient not taking: Reported on 08/17/2017 07/06/17   Troy Sine, MD    Family History Family History  Problem Relation Age of Onset  . Stroke Mother   . Heart disease Father   . Heart attack Father   . Cancer Sister   . Stroke Maternal Grandfather   . Cancer Paternal Grandmother   . Heart attack Paternal Grandfather   . Heart disease Paternal Grandfather   . Colon cancer Neg Hx   . Esophageal cancer Neg Hx   . Pancreatic cancer Neg Hx   . Rectal cancer Neg Hx   . Stomach cancer Neg Hx     Social History Social History   Tobacco Use  . Smoking status: Never Smoker  . Smokeless tobacco: Never Used  Substance Use Topics  . Alcohol use: Yes    Alcohol/week:  1.2 oz    Types: 2 Glasses of wine per week    Comment: occ. x 2 drinks weekly  . Drug use: No     Allergies   Gluten meal; Wheat bran; Statins; and Sulfa antibiotics   Review of Systems Review of Systems  Cardiovascular: Positive for chest pain. Negative for palpitations and leg swelling.  Gastrointestinal: Negative for abdominal pain, nausea and vomiting.       + belching  All other systems reviewed and are negative.    Physical Exam Updated Vital Signs BP 128/68   Pulse (!) 58   Temp 98.2 F (36.8 C) (Oral)   Resp 16   SpO2 100%   Physical Exam  Constitutional: He is oriented to person, place, and time. He  appears well-developed and well-nourished. No distress.  HENT:  Head: Normocephalic and atraumatic.  Neck: No JVD present.  Cardiovascular: Normal rate, regular rhythm and normal heart sounds.  No murmur heard. Pulmonary/Chest: Effort normal and breath sounds normal. No respiratory distress. He has no wheezes. He has no rales.  Abdominal: Soft. He exhibits no distension. There is no tenderness.  Musculoskeletal: He exhibits no edema.  Neurological: He is alert and oriented to person, place, and time.  Skin: Skin is warm and dry.  Nursing note and vitals reviewed.    ED Treatments / Results  Labs (all labs ordered are listed, but only abnormal results are displayed) Labs Reviewed  BASIC METABOLIC PANEL - Abnormal; Notable for the following components:      Result Value   Glucose, Bld 112 (*)    All other components within normal limits  CBC  TROPONIN I  TROPONIN I  TROPONIN I  I-STAT TROPONIN, ED    EKG EKG Interpretation  Date/Time:  Monday August 17 2017 10:06:44 EDT Ventricular Rate:  56 PR Interval:  166 QRS Duration: 92 QT Interval:  414 QTC Calculation: 399 R Axis:   10 Text Interpretation:  Sinus bradycardia Otherwise normal ECG Comparison vs 05/2017 Isolated TWI III Prominant T waves without ST changes laterally Confirmed by Tanna Furry  [phone]) on 08/17/2017 12:37:37 PM   Radiology Dg Chest 2 View  Result Date: 08/17/2017 CLINICAL DATA:  67 year old male with intermittent chest pain for 3 days. Symptomatic relief with sublingual nitroglycerin. EXAM: CHEST - 2 VIEW COMPARISON:  Chest CT 08/03/2017 and earlier. FINDINGS: Stable lung volumes and mild elevation of the right hemidiaphragm. No pneumothorax, pulmonary edema, pleural effusion or confluent pulmonary opacity. Visualized tracheal air column is within normal limits. Cardiac and mediastinal contours remain normal. No acute osseous abnormality identified. Negative visible bowel gas pattern. IMPRESSION: No acute cardiopulmonary abnormality. Electronically Signed   By: Genevie Ann M.D.   On: 08/17/2017 09:34    Procedures Procedures (including critical care time)  Medications Ordered in ED Medications  nitroGLYCERIN (NITROSTAT) SL tablet 0.4 mg (0.4 mg Sublingual Given 08/17/17 1302)  nitroGLYCERIN 50 mg in dextrose 5 % 250 mL (0.2 mg/mL) infusion (has no administration in time range)  acetaminophen (TYLENOL) tablet 650 mg (has no administration in time range)  ondansetron (ZOFRAN) injection 4 mg (has no administration in time range)  sodium chloride flush (NS) 0.9 % injection 3 mL (has no administration in time range)  ALPRAZolam (XANAX) tablet 0.25 mg (has no administration in time range)  zolpidem (AMBIEN) tablet 5 mg (has no administration in time range)  bisoprolol (ZEBETA) tablet 2.5 mg (has no administration in time range)  mometasone-formoterol (DULERA) 200-5 MCG/ACT inhaler 2 puff (has no administration in time range)  clopidogrel (PLAVIX) tablet 75 mg (has no administration in time range)  niacin (NIASPAN) CR tablet 1,000 mg (has no administration in time range)  pravastatin (PRAVACHOL) tablet 80 mg (has no administration in time range)  albuterol (PROVENTIL HFA;VENTOLIN HFA) 108 (90 Base) MCG/ACT inhaler 2 puff (has no administration in time range)  pantoprazole  (PROTONIX) EC tablet 80 mg (has no administration in time range)     Initial Impression / Assessment and Plan / ED Course  I have reviewed the triage vital signs and the nursing notes.  Pertinent labs & imaging results that were available during my care of the patient were reviewed by me and considered in my medical decision making (see chart for details).    Broadus John  ATWELL MCDANEL is a 67 y.o. male who presents to ED for chest pain over the last 2 days.  History of prior 95% LAD blockage resulting in stent placement in February 2019.  Pain today feels similar, although much less severe.  It does radiate up to his right jaw.  Initial EKG without any acute changes from previous.  Troponin negative.  Given dose of nitroglycerin in ED with relief. Given history, cardiology consulted who will admit for further evaluation.  Patient discussed with Dr. Jeneen Rinks who agrees with treatment plan.    Final Clinical Impressions(s) / ED Diagnoses   Final diagnoses:  Unstable angina [facility])    ED Discharge Orders    None       Leocadia Idleman, Ozella Almond, PA-C 08/17/17 1410    Tanna Furry, MD 08/23/17 [phone]

## 2017-08-17 NOTE — ED Notes (Signed)
Pt family to desk stating pt chest pressure has returned. Call placed to Dr. Sherry Ruffing who was updated on pt and labs. Advised to repeat EKG. Pt escorted to triage to repeat EKG.

## 2017-08-17 NOTE — ED Notes (Signed)
Service response called for diet tray, emphasized that pt has Celiac and is allergic to gluten, wheat, barley, rye, etc. Nutritional services verbalized understanding. Tray to be delivered approx 1610

## 2017-08-17 NOTE — ED Notes (Signed)
Pt ambulatory to restroom with steady gait.

## 2017-08-17 NOTE — H&P (Addendum)
Cardiology Admission History and Physical:   Patient ID: Jose Warner; MRN: [id number]; DOB: 09-08-1950   Admission date: 08/17/2017  Primary Care Provider: Derinda Late, MD Primary Cardiologist: Shelva Majestic, MD   Chief Complaint:  Chest pain  Patient Profile:   Jose Warner is a 67 y/o male, sent to the ED from cardiac rehab with chest pain.   History of Present Illness:   Jose Warner is a 67 y.o. male with a history of CAD, s/p mLAD PCI with DES 2/21.19. He had residual 40% proximal and 40% distal LAD narrowing. His symptoms were chest pressure and SOB and this improved post PCI. He was admitted 48 hrs later with atypical chest pain and was kept over night. [name] saw the patient in the office 07/02/17. He continued to have some Rt upper chest pain and saw his PCP who ordered a non contrast chest CT 08/03/17 which was unremarkable. The pt has otherwise been doing well, he has been going to cardiac rehab for the past two weeks.   Saturday  (48 hrs ago) he noted SSCP-" pressure" while at rest. He took a SL NTG x 1 with relief. Sunday he had another episode also relieve with NTG x 1. This morning he felt fine, went to cardiac rehab and again noted SSCP and was sent to the ED. In the ED his symptoms were relieved with NTG but have returned. He describes his chest pain as similar to his pre PCI pain though not as severe. NO associated nausea, vomiting, or diuresis.    Past Medical History:  Diagnosis Date  . Anemia   . Atrial fibrillation ([place]) 06/05/2017  . Bronchitis 05-10-12   past fall- 4 runs antibiotics due to bronchitis-uses [employer] as needed  . Celiac disease   . Colon polyps    adenomatous  . Coronary artery disease   . Diverticulosis   . GERD (gastroesophageal reflux disease)   . Hepatitis 05-10-12   "was told non A, non B"-unclean dental equip.  Marland Kitchen Hyperlipidemia   . Iron deficiency anemia   . Prostate cancer ([place]) 05-10-12   ;bx. 04-12-12, dx  . Spasm of  esophagus 2013    Past Surgical History:  Procedure Laterality Date  . CARDIAC CATHETERIZATION  09/13/2004   LAD:30%-40% in prox. to mid segment with 20% in the mid segment and 20% in left Circ., mild 20% right common iliac narrowing. medical therapy  . CARDIOVERSION  06/05/2017  . CORONARY STENT INTERVENTION N/A 06/11/2017   Procedure: CORONARY STENT INTERVENTION;  Surgeon: Belva Crome, MD;  Location: [facility];  Service: Cardiovascular;  Laterality: N/A;  . HERNIA REPAIR  5 yrs ago   right   . LEFT HEART CATH AND CORONARY ANGIOGRAPHY N/A 06/11/2017   Procedure: LEFT HEART CATH AND CORONARY ANGIOGRAPHY;  Surgeon: Belva Crome, MD;  Location: [facility];  Service: Cardiovascular;  Laterality: N/A;  . NM MYOCAR PERF WALL MOTION  11/30/2007   protocol:[place], post EF 67%,mild perfusion defect seen in basal inferior consistant with Attenuation artifact, exercise cap. 12METS  . ROBOT ASSISTED LAPAROSCOPIC RADICAL PROSTATECTOMY  05/20/2012   Procedure: ROBOTIC ASSISTED LAPAROSCOPIC RADICAL PROSTATECTOMY LEVEL 1;  Surgeon: Dutch Gray, MD;  Location: [facility];  Service: Urology;  Laterality: N/A;     . TRANSURETHRAL RESECTION OF PROSTATE  54yr ago     Medications Prior to Admission: Prior to Admission medications   Medication Sig Start Date End Date Taking? Authorizing Provider  bisoprolol (ZEBETA) 5 MG tablet Take 0.5 tablets (2.5 mg total) by mouth daily. 06/15/17 08/17/17 Yes Hoffman, Jessica Ratliff, DO  budesonide-formoterol (SYMBICORT) 160-4.5 MCG/ACT inhaler Inhale 2 puffs into the lungs 2 (two) times daily as needed (for cough).    Yes [provider]  clopidogrel (PLAVIX) 75 MG tablet Take 1 tablet (75 mg total) by mouth daily. 07/02/17  Yes Troy Sine, MD  guaiFENesin (MUCINEX) 600 MG 12 hr tablet Take 600 mg by mouth 2 (two) times daily as needed.   Yes [provider]  Multiple Vitamins-Minerals (MULTIVITAMIN PO) Take 1 tablet by mouth at bedtime.     Yes [provider]  niacin (NIASPAN) 1000 MG CR tablet Take 1 tablet by mouth at bedtime.  12/04/15  Yes [provider]  pantoprazole (PROTONIX) 40 MG tablet Take 80 mg by mouth daily.  06/24/17  Yes [provider]  pravastatin (PRAVACHOL) 80 MG tablet Take 80 mg by mouth daily.   Yes [provider]  PROAIR HFA 108 (90 BASE) MCG/ACT inhaler Take 2 puffs by mouth 2 (two) times daily as needed. For shortness of breath. 02/13/12  Yes [provider]  rivaroxaban (XARELTO) 20 MG TABS tablet Take 1 tablet (20 mg total) by mouth daily with supper. 07/03/17  Yes Troy Sine, MD  Vitamin D, Ergocalciferol, 2000 units CAPS Take 1 capsule by mouth at bedtime.    Yes [provider]  nitroGLYCERIN (NITROSTAT) 0.4 MG SL tablet Place 1 tablet (0.4 mg total) under the tongue every 5 (five) minutes x 3 doses as needed for chest pain. 06/15/17   Valinda Party, DO  rivaroxaban (XARELTO) 20 MG TABS tablet Take 1 tablet (20 mg total) by mouth daily with supper. Patient not taking: Reported on 08/17/2017 07/06/17   Troy Sine, MD     Allergies:    Allergies  Allergen Reactions  . Gluten Meal Other (See Comments)    celiac  . Wheat Bran Other (See Comments)    Celiac   . Statins     Leg muscle pains - rosuvastatin, atorvastatin, zetia. Tolerates simvastatin  . Sulfa Antibiotics Rash    Social History:   Social History   Socioeconomic History  . Marital status: Married    Spouse name: Not on file  . Number of children: 3  . Years of education: Not on file  . Highest education level: Not on file  Occupational History    Employer: [employer]  . Financial resource strain: Not on file  . Food insecurity:    Worry: Patient refused    Inability: Patient refused  . Transportation needs:    Medical: Patient refused    Non-medical: Patient refused  Tobacco Use  . Smoking status: Never Smoker  . Smokeless tobacco:  Never Used  Substance and Sexual Activity  . Alcohol use: Yes    Alcohol/week: 1.2 oz    Types: 2 Glasses of wine per week    Comment: occ. x 2 drinks weekly  . Drug use: No  . Sexual activity: Yes    Partners: Female  Lifestyle  . Physical activity:    Days per week: Not on file    Minutes per session: Not on file  . Stress: Not on file  Relationships  . Social connections:    Talks on phone: Patient refused    Gets together: Patient refused    Attends religious service: Patient refused    Active member of  club or organization: Patient refused    Attends meetings of clubs or organizations: Patient refused    Relationship status: Patient refused  . Intimate partner violence:    Fear of current or ex partner: Patient refused    Emotionally abused: Patient refused    Physically abused: Patient refused    Forced sexual activity: Patient refused  Other Topics Concern  . Not on file  Social History Narrative  . Not on file    Family History:   The patient's family history includes Cancer in his paternal grandmother and sister; Heart attack in his father and paternal grandfather; Heart disease in his father and paternal grandfather; Stroke in his maternal grandfather and mother. There is no history of Colon cancer, Esophageal cancer, Pancreatic cancer, Rectal cancer, or Stomach cancer.    ROS:  Please see the history of present illness.  Pt drove to [place] last week for a vacation. He denies hemoptysis, SOB, or pleuritic pain. No calf pain or swelling and negative Homans'.  All other ROS reviewed and negative.     Physical Exam/Data:   Vitals:   08/17/17 1330 08/17/17 1345 08/17/17 1400 08/17/17 1415  BP: 124/77 128/68 (!) 152/77 140/67  Pulse: 62 (!) 56 (!) 58 (!) 57  Resp: (!) 24 12 16 20   Temp:      TempSrc:      SpO2: 100% 100% 100% 99%  Weight:   174 lb 2.6 oz (79 kg)    No intake or output data in the 24 hours ending 08/17/17 1433 Filed Weights   08/17/17 1400    Weight: 174 lb 2.6 oz (79 kg)   Body mass index is 26.48 kg/m.  General:  Well nourished, well developed, in no acute distress HEENT: normal Lymph: no adenopathy Neck: no JVD Endocrine:  No thryomegaly Vascular: No carotid bruits; FA pulses 2+ bilaterally without bruits  Cardiac:  normal S1, S2; RRR; no murmur  Lungs:  clear to auscultation bilaterally, no wheezing, rhonchi or rales  Abd: soft, nontender, no hepatomegaly  Ext: no edema Musculoskeletal:  No deformities, BUE and BLE strength normal and equal Skin: warm and dry  Neuro:  CNs 2-12 intact, no focal abnormalities noted Psych:  Normal affect    EKG:  The ECG that was done NSR, SB    Laboratory Data:  Chemistry Recent Labs  Lab 08/17/17 0911  NA 137  K 4.2  CL 105  CO2 23  GLUCOSE 112*  BUN 17  CREATININE 1.09  CALCIUM 9.1  GFRNONAA >60  GFRAA >60  ANIONGAP 9    No results for input(s): PROT, ALBUMIN, AST, ALT, ALKPHOS, BILITOT in the last 168 hours. Hematology Recent Labs  Lab 08/17/17 0911  WBC 5.1  RBC 4.98  HGB 14.4  HCT 44.6  MCV 89.6  MCH 28.9  MCHC 32.3  RDW 14.7  PLT 208   Cardiac EnzymesNo results for input(s): TROPONINI in the last 168 hours.  Recent Labs  Lab 08/17/17 0933  TROPIPOC 0.00    BNPNo results for input(s): BNP, PROBNP in the last 168 hours.  DDimer No results for input(s): DDIMER in the last 168 hours.  Radiology/Studies:  Dg Chest 2 View  Result Date: 08/17/2017 CLINICAL DATA:  67 year old male with intermittent chest pain for 3 days. Symptomatic relief with sublingual nitroglycerin. EXAM: CHEST - 2 VIEW COMPARISON:  Chest CT 08/03/2017 and earlier. FINDINGS: Stable lung volumes and mild elevation of the right hemidiaphragm. No pneumothorax, pulmonary edema, pleural effusion or  confluent pulmonary opacity. Visualized tracheal air column is within normal limits. Cardiac and mediastinal contours remain normal. No acute osseous abnormality identified. Negative visible  bowel gas pattern. IMPRESSION: No acute cardiopulmonary abnormality. Electronically Signed   By: Genevie Ann M.D.   On: 08/17/2017 09:34    Assessment and Plan:   [place]- Recurrent chest pressure last 48 hrs, similar to pre PCI symptoms though not as severe- NTG responsive  CAD- Nonobstructive with 20 - 30% stenosis LAD 2006 [place]- mLAD PCI with DES 06/11/17 with residual 40% narrowing both proximal and distal to the stent Chest pain 05/2417- admitted overnight- MI r/o  PAF- NSR-SB on low dose Bystolic  Chronic anticoagulation- On Xarelto- CHADS VASC= 2 for age and vascular disease  Plan: Start O2 and IV NTG, hold Xarelto (last dose was 6 pm 4/28), start Heparin, r/o MI- restudy tomorrow. The patient understands that risks included but are not limited to stroke (1 in 1000), death (1 in 19), kidney failure [usually temporary] (1 in 500), bleeding (1 in 200), allergic reaction [possibly serious] (1 in 200).  The patient understands and agrees to proceed.   Severity of Illness: The appropriate patient status for this patient is OBSERVATION. Observation status is judged to be reasonable and necessary in order to provide the required intensity of service to ensure the patient's safety. The patient's presenting symptoms, physical exam findings, and initial radiographic and laboratory data in the context of their medical condition is felt to place them at decreased risk for further clinical deterioration. Furthermore, it is anticipated that the patient will be medically stable for discharge from the hospital within 2 midnights of admission. The following factors support the patient status of observation.   " The patient's presenting symptoms include Chest pain . " The physical exam findings include normal. " The initial radiographic and laboratory data are normal.     For questions or updates, please contact [place] Please consult [email or web address] for contact info under Cardiology/STEMI.     Patient seen, examined. Available data reviewed. Agree with findings, assessment, and plan as outlined by Jose Warner. On my exam: Vitals:   08/17/17 1400 08/17/17 1415  BP: (!) 152/77 140/67  Pulse: (!) 58 (!) 57  Resp: 16 20  Temp:    SpO2: 100% 99%  .Pt is alert and oriented, NAD HEENT: normal Neck: JVP - normal, carotids 2+= without bruits Lungs: CTA bilaterally CV: RRR without murmur or gallop Abd: soft, NT, Positive BS, no hepatomegaly Ext: no C/C/E, distal pulses intact and equal Skin: warm/dry no rash  The patient describes chest pressure at rest very similar to his previous symptoms associated with unstable angina when he was treated with PCI of the LAD.  The patient's objective findings today that are negative with a normal troponin and EKG without acute ischemic changes.  However, with symptoms similar to his previous angina, now occurring at rest, it seems most prudent to relook at his coronaries with angiography.  The patient had some atypical features of his symptoms on initial presentation in February when he was found to have critical LAD stenosis treated with coronary stenting.  Reviewed options of both noninvasive and invasive assessment with the patient and his wife who is at the bedside and they agree that definitive assessment is the best approach here.  I reviewed risks, indications, and alternatives to this approach with the patient and his wife. They understand that risks include but are not limited to bleeding, infection, vascular injury, stroke, myocardial infection,  arrhythmia, kidney injury, radiation-related injury in the case of prolonged fluoroscopy use, emergency cardiac surgery, and death. The patient understands the risks of serious complication is 1-2 in 8347 with diagnostic cardiac cath and 1-2% or less with angioplasty/stenting. Will hold Xarelto and plan on radial cath tomorrow unless symptoms worsen. Will treat with IV heparin and NTG to CP - free.     Sherren Mocha, M.D. 08/17/2017 2:34 PM    Signed, Sherren Mocha, MD  08/17/2017 2:33 PM

## 2017-08-17 NOTE — ED Triage Notes (Signed)
Pt arrives from cardiac rehab, reports central chest pain that began Friday and has been on and off, used SL nitro over the weekend that provided relief. Pt reports sob is typical for him, denies any worsened sob. Nad. resp e/u.

## 2017-08-18 ENCOUNTER — Ambulatory Visit (HOSPITAL_COMMUNITY)
Admission: EM | Disposition: A | Discharge status: Home / Self Care | Payer: Self-pay | Source: Home / Self Care | Attending: Emergency Medicine

## 2017-08-18 ENCOUNTER — Other Ambulatory Visit: Payer: Self-pay

## 2017-08-18 ENCOUNTER — Encounter (HOSPITAL_COMMUNITY): Payer: Self-pay | Admitting: Cardiology

## 2017-08-18 DIAGNOSIS — R079 Chest pain, unspecified: Secondary | ICD-10-CM

## 2017-08-18 DIAGNOSIS — I2510 Atherosclerotic heart disease of native coronary artery without angina pectoris: Secondary | ICD-10-CM | POA: Diagnosis not present

## 2017-08-18 DIAGNOSIS — I2584 Coronary atherosclerosis due to calcified coronary lesion: Secondary | ICD-10-CM | POA: Diagnosis not present

## 2017-08-18 DIAGNOSIS — I480 Paroxysmal atrial fibrillation: Secondary | ICD-10-CM | POA: Diagnosis not present

## 2017-08-18 DIAGNOSIS — R0789 Other chest pain: Secondary | ICD-10-CM | POA: Diagnosis not present

## 2017-08-18 HISTORY — PX: LEFT HEART CATH AND CORONARY ANGIOGRAPHY: CATH118249

## 2017-08-18 LAB — CBC
HCT: 41.3 % (ref 39.0–52.0)
Hemoglobin: 13.1 g/dL (ref 13.0–17.0)
MCH: 28.2 pg (ref 26.0–34.0)
MCHC: 31.7 g/dL (ref 30.0–36.0)
MCV: 89 fL (ref 78.0–100.0)
PLATELETS: 176 10*3/uL (ref 150–400)
RBC: 4.64 MIL/uL (ref 4.22–5.81)
RDW: 14.6 % (ref 11.5–15.5)
WBC: 6 10*3/uL (ref 4.0–10.5)

## 2017-08-18 LAB — BASIC METABOLIC PANEL
Anion gap: 8 (ref 5–15)
BUN: 13 mg/dL (ref 6–20)
CO2: 23 mmol/L (ref 22–32)
Calcium: 8.4 mg/dL — ABNORMAL LOW (ref 8.9–10.3)
Chloride: 109 mmol/L (ref 101–111)
Creatinine, Ser: 1 mg/dL (ref 0.61–1.24)
GFR calc Af Amer: >60 mL/min (ref >60)
GFR calc non Af Amer: >60 mL/min (ref >60)
Glucose, Bld: 102 mg/dL — ABNORMAL HIGH (ref 65–99)
Potassium: 4.2 mmol/L (ref 3.5–5.1)
Sodium: 140 mmol/L (ref 135–145)

## 2017-08-18 LAB — MRSA PCR SCREENING: MRSA by PCR: NEGATIVE

## 2017-08-18 LAB — APTT: aPTT: 79 seconds — ABNORMAL HIGH (ref 24–36)

## 2017-08-18 LAB — HEPARIN LEVEL (UNFRACTIONATED): Heparin Unfractionated: 0.71 IU/mL — ABNORMAL HIGH (ref 0.30–0.70)

## 2017-08-18 LAB — TROPONIN I: Troponin I: <0.03 ng/mL (ref <0.03)

## 2017-08-18 SURGERY — LEFT HEART CATH AND CORONARY ANGIOGRAPHY
Anesthesia: LOCAL

## 2017-08-18 MED ORDER — SODIUM CHLORIDE 0.9 % IV SOLN: 250.0000 mL | INTRAVENOUS | Status: DC | PRN

## 2017-08-18 MED ORDER — LIDOCAINE HCL (PF) 1 % IJ SOLN
INTRAMUSCULAR | Status: AC
  Filled 2017-08-18: qty 30

## 2017-08-18 MED ORDER — FENTANYL CITRATE (PF) 100 MCG/2ML IJ SOLN
INTRAMUSCULAR | Status: AC
  Filled 2017-08-18: qty 2

## 2017-08-18 MED ORDER — VERAPAMIL HCL 2.5 MG/ML IV SOLN
INTRAVENOUS | Status: AC
  Filled 2017-08-18: qty 2

## 2017-08-18 MED ORDER — HEPARIN SODIUM (PORCINE) 1000 UNIT/ML IJ SOLN
INTRAMUSCULAR | Status: DC | PRN
  Administered 2017-08-18: 4000 [IU] via INTRAVENOUS

## 2017-08-18 MED ORDER — HEPARIN (PORCINE) IN NACL 2-0.9 UNITS/ML
INTRAMUSCULAR | Status: AC | PRN
  Administered 2017-08-18 (×2): 500 mL

## 2017-08-18 MED ORDER — MIDAZOLAM HCL 2 MG/2ML IJ SOLN
INTRAMUSCULAR | Status: DC | PRN
  Administered 2017-08-18: 1 mg via INTRAVENOUS

## 2017-08-18 MED ORDER — VERAPAMIL HCL 2.5 MG/ML IV SOLN
INTRAVENOUS | Status: DC | PRN
  Administered 2017-08-18: 10 mL via INTRA_ARTERIAL

## 2017-08-18 MED ORDER — HEPARIN (PORCINE) IN NACL 1000-0.9 UT/500ML-% IV SOLN
INTRAVENOUS | Status: AC
  Filled 2017-08-18: qty 1000

## 2017-08-18 MED ORDER — ONDANSETRON HCL 4 MG/2ML IJ SOLN
4.0000 mg | Freq: Four times a day (QID) | INTRAMUSCULAR | Status: DC | PRN
Start: 2017-08-18 — End: 2017-08-18

## 2017-08-18 MED ORDER — ACETAMINOPHEN 325 MG PO TABS: 650.0000 mg | ORAL_TABLET | ORAL | Status: DC | PRN

## 2017-08-18 MED ORDER — SODIUM CHLORIDE 0.9% FLUSH: 3.0000 mL | INTRAVENOUS | Status: DC | PRN

## 2017-08-18 MED ORDER — FENTANYL CITRATE (PF) 100 MCG/2ML IJ SOLN
INTRAMUSCULAR | Status: DC | PRN
  Administered 2017-08-18: 25 ug via INTRAVENOUS

## 2017-08-18 MED ORDER — IOHEXOL 350 MG/ML SOLN
INTRAVENOUS | Status: DC | PRN
  Administered 2017-08-18: 50 mL via INTRA_ARTERIAL

## 2017-08-18 MED ORDER — HEPARIN SODIUM (PORCINE) 1000 UNIT/ML IJ SOLN
INTRAMUSCULAR | Status: AC
  Filled 2017-08-18: qty 1

## 2017-08-18 MED ORDER — LIDOCAINE HCL (PF) 1 % IJ SOLN
INTRAMUSCULAR | Status: DC | PRN
  Administered 2017-08-18: 2 mL via INTRADERMAL

## 2017-08-18 MED ORDER — SODIUM CHLORIDE 0.9% FLUSH: 3.0000 mL | Freq: Two times a day (BID) | INTRAVENOUS | Status: DC

## 2017-08-18 MED ORDER — RIVAROXABAN 20 MG PO TABS: 20.0000 mg | ORAL_TABLET | Freq: Every day | ORAL | 0 refills | Status: DC

## 2017-08-18 MED ORDER — SODIUM CHLORIDE 0.9 % WEIGHT BASED INFUSION
1.0000 mL/kg/h | INTRAVENOUS | Status: AC
  Administered 2017-08-18: 1 mL/kg/h via INTRAVENOUS

## 2017-08-18 MED ORDER — MIDAZOLAM HCL 2 MG/2ML IJ SOLN
INTRAMUSCULAR | Status: AC
  Filled 2017-08-18: qty 2

## 2017-08-18 SURGICAL SUPPLY — 14 items
CATH INFINITI 5 FR JL3.5 (CATHETERS) ×2 IMPLANT
CATH INFINITI 5FR ANG PIGTAIL (CATHETERS) ×2 IMPLANT
CATH INFINITI JR4 5F (CATHETERS) ×2 IMPLANT
DEVICE RAD COMP TR BAND LRG (VASCULAR PRODUCTS) ×2 IMPLANT
GUIDEWIRE INQWIRE 1.5J.035X260 (WIRE) ×1 IMPLANT
INQWIRE 1.5J .035X260CM (WIRE) ×2
KIT HEART LEFT (KITS) ×2 IMPLANT
NEEDLE PERC 21GX4CM (NEEDLE) ×2 IMPLANT
PACK CARDIAC CATHETERIZATION (CUSTOM PROCEDURE TRAY) ×2 IMPLANT
SHEATH RAIN RADIAL 21G 6FR (SHEATH) ×2 IMPLANT
SYR MEDRAD MARK V 150ML (SYRINGE) ×2 IMPLANT
TRANSDUCER W/STOPCOCK (MISCELLANEOUS) ×2 IMPLANT
TUBING CIL FLEX 10 FLL-RA (TUBING) ×2 IMPLANT
WIRE HI TORQ VERSACORE-J 145CM (WIRE) ×2 IMPLANT

## 2017-08-18 NOTE — Interval H&P Note (Signed)
History and Physical Interval Note:  08/18/2017 8:41 AM  Jose Warner  has presented today for surgery, with the diagnosis of [place]  The various methods of treatment have been discussed with the patient and family. After consideration of risks, benefits and other options for treatment, the patient has consented to  Procedure(s): LEFT HEART CATH AND CORONARY ANGIOGRAPHY (N/A) as a surgical intervention .  The patient's history has been reviewed, patient examined, no change in status, stable for surgery.  I have reviewed the patient's chart and labs.  Questions were answered to the patient's satisfaction.   Cath Lab Visit (complete for each Cath Lab visit)  Clinical Evaluation Leading to the Procedure:   ACS: Yes.    Non-ACS:    Anginal Classification: CCS III  Anti-ischemic medical therapy: Maximal Therapy (2 or more classes of medications)  Non-Invasive Test Results: No non-invasive testing performed  Prior CABG: No previous CABG        Jose Warner [facility] 08/18/2017 8:41 AM

## 2017-08-18 NOTE — Progress Notes (Signed)
Progress Note  Patient Name: Jose Warner Date of Encounter: 08/18/2017  Primary Cardiologist: Shelva Majestic, MD   Subjective   Feeling well this afternoon. No recurrent chest pain. No dyspnea.   Inpatient Medications    Scheduled Meds: . bisoprolol  2.5 mg Oral Daily  . clopidogrel  75 mg Oral Daily  . mometasone-formoterol  2 puff Inhalation BID  . niacin  1,000 mg Oral QHS  . pantoprazole  80 mg Oral Daily  . pravastatin  80 mg Oral Daily  . sodium chloride flush  3 mL Intravenous Q12H   Continuous Infusions: . sodium chloride     PRN Meds: sodium chloride, acetaminophen, albuterol, ALPRAZolam, gi cocktail, nitroGLYCERIN, ondansetron (ZOFRAN) IV, sodium chloride flush, sodium chloride flush, zolpidem   Vital Signs    Vitals:   08/18/17 0921 08/18/17 1316 08/18/17 1638 08/18/17 1658  BP:  105/68  119/63  Pulse:  (!) 58  62  Resp:      Temp:  98.8 F (37.1 C)  97.9 F (36.6 C)  TempSrc:  Oral  Oral  SpO2: 100% 97%  97%  Weight:      Height:   5' 8"  (1.727 m)     Intake/Output Summary (Last 24 hours) at 08/18/2017 1752 Last data filed at 08/18/2017 1622 Gross per 24 hour  Intake 996.5 ml  Output 350 ml  Net 646.5 ml   Filed Weights   08/17/17 1400 08/18/17 0500  Weight: 174 lb 2.6 oz (79 kg) 171 lb (77.6 kg)    Telemetry    Normal sinus rhythm without significant arrhythmia - Personally Reviewed   Physical Exam  Alert, oriented male, in NAD GEN: No acute distress.   Neck: No JVD Cardiac: RRR, no murmurs, rubs, or gallops.  Respiratory: Clear to auscultation bilaterally. GI: Soft, nontender, non-distended  MS: No edema; No deformity. Right radial site clear. Neuro:  Nonfocal  Psych: Normal affect   Labs    Chemistry Recent Labs  Lab 08/17/17 0911 08/18/17 0214  NA 137 140  K 4.2 4.2  CL 105 109  CO2 23 23  GLUCOSE 112* 102*  BUN 17 13  CREATININE 1.09 1.00  CALCIUM 9.1 8.4*  GFRNONAA >60 >60  GFRAA >60 >60  ANIONGAP 9 8      Hematology Recent Labs  Lab 08/17/17 0911 08/18/17 0214  WBC 5.1 6.0  RBC 4.98 4.64  HGB 14.4 13.1  HCT 44.6 41.3  MCV 89.6 89.0  MCH 28.9 28.2  MCHC 32.3 31.7  RDW 14.7 14.6  PLT 208 176    Cardiac Enzymes Recent Labs  Lab 08/17/17 1403 08/17/17 1933 08/18/17 0210  TROPONINI <0.03 <0.03 <0.03    Recent Labs  Lab 08/17/17 0933  TROPIPOC 0.00     BNPNo results for input(s): BNP, PROBNP in the last 168 hours.   DDimer No results for input(s): DDIMER in the last 168 hours.   Radiology    Dg Chest 2 View  Result Date: 08/17/2017 CLINICAL DATA:  67 year old male with intermittent chest pain for 3 days. Symptomatic relief with sublingual nitroglycerin. EXAM: CHEST - 2 VIEW COMPARISON:  Chest CT 08/03/2017 and earlier. FINDINGS: Stable lung volumes and mild elevation of the right hemidiaphragm. No pneumothorax, pulmonary edema, pleural effusion or confluent pulmonary opacity. Visualized tracheal air column is within normal limits. Cardiac and mediastinal contours remain normal. No acute osseous abnormality identified. Negative visible bowel gas pattern. IMPRESSION: No acute cardiopulmonary abnormality. Electronically Signed   By: Lemmie Evens  [place] Crane M.D.   On: 08/17/2017 09:34    Cardiac Studies   Cath: Conclusion     Mid LAD lesion is 40% stenosed.  Prox LAD-1 lesion is 30% stenosed.  Prox LAD-2 lesion is 40% stenosed.  Previously placed Prox LAD-3 stent (unknown type) is widely patent.  The left ventricular systolic function is normal.  LV end diastolic pressure is normal.  The left ventricular ejection fraction is 55-65% by visual estimate.   1. Nonobstructive CAD. Patent stent in the LAD 2. Normal LV function 3. Normal LVEDP   Patient Profile     67 y.o. male with known CAD, hx LAD stenting, presents with chest pain at rest concerning for unstable angina pectoris  Assessment & Plan    1. Chest pain at rest: cath reassuring - patent LAD stent, no  obstructive CAD. Suspect noncardiac pain. Pt will FU with PCP. Would continue current medical Rx.   2. PAF - in sinus rhythm. Advised he resume Xarelto tomorrow at his normal evening dose.   Dispo: home today. FU [name]. Continue same medical Rx.   For questions or updates, please contact [place] Please consult [email or web address] for contact info under Cardiology/STEMI.      Signed, Sherren Mocha, MD  08/18/2017, 5:52 PM

## 2017-08-18 NOTE — Discharge Summary (Signed)
Discharge Summary    Patient ID: Jose Warner,  MRN: [id number], DOB/AGE: 1951-01-13 67 y.o.  Admit date: 08/17/2017 Discharge date: 08/18/2017  Primary Care Provider: Derinda Late Primary Cardiologist: Shelva Majestic, MD  Discharge Diagnoses    Active Problems:   H/O prostate cancer   Low HDL (under 40)   Paroxysmal atrial fibrillation (HCC)   CAD S/P percutaneous coronary angioplasty   GERD (gastroesophageal reflux disease)   Chronic anticoagulation   Asthma   Unstable angina (HCC)   Allergies Allergies  Allergen Reactions  . Gluten Meal Other (See Comments)    celiac  . Wheat Bran Other (See Comments)    Celiac   . Statins     Leg muscle pains - rosuvastatin, atorvastatin, zetia. Tolerates simvastatin  . Sulfa Antibiotics Rash    Diagnostic Studies/Procedures    LHC 08/18/17 Procedures   LEFT HEART CATH AND CORONARY ANGIOGRAPHY  Conclusion     Mid LAD lesion is 40% stenosed.  Prox LAD-1 lesion is 30% stenosed.  Prox LAD-2 lesion is 40% stenosed.  Previously placed Prox LAD-3 stent (unknown type) is widely patent.  The left ventricular systolic function is normal.  LV end diastolic pressure is normal.  The left ventricular ejection fraction is 55-65% by visual estimate.   1. Nonobstructive CAD. Patent stent in the LAD 2. Normal LV function 3. Normal LVEDP  Plan: continue medical therapy.      History of Present Illness     Mr. Jose Warner is a 67 y.o. male, followed by Dr. Claiborne Billings, with a history of CAD, s/p mLAD PCI with DES 06/11/17. He had residual 40% proximal and 40% distal LAD narrowing, treated medically. He also has h/o atrial fibrillation, on Xarelto.   Recently, the patient developed new CP symptoms c/w unstable angina. 2 days prior to admit, he developed SSCP at rest, relieved with SL NTG. He has had recurrent CP off and on since then. Day of admit, 08/17/17, he was in cardiac rehab and had recurrent CP and was sent to the ED. In  the ED, his symptoms were relieved with NTG, but returned. Given his history, he was admitted for definitive LHC.  He was admitted to telemetry and Xarelto was held for cath.    Hospital Course     Pt was admitted to tele. Troponins were cycled and were negative x 3. On 08/18/17, he underwent LHC by Dr. Martinique. Cath showed nonobstructive CAD with patent stent in the LAD. LVF was normal and normal LVEDP. His CP was felt to be noncardiac. Continued medical therapy for CAD was recommended.  He was continued on Plavix, statin and BB. No ASA due to Xarelto for afib. Dr. Burt Knack recommended that he restart his Xarelto on 08/19/17 at his usual evening time. On 08/18/17, he was seen by Dr. Copper and felt stable for discharge. He was monitored post cath and had no complications. His radial site remained stable as well as vital signs. He was advised to f/u with his PCP. F/u in general cardiology clinic will also be arranged.   Consultants: none     Discharge Vitals Blood pressure 119/63, pulse 62, temperature 97.9 F (36.6 C), temperature source Oral, resp. rate (!) 42, height 5\' 8"  (1.727 m), weight 171 lb (77.6 kg), SpO2 97 %.  Filed Weights   08/17/17 1400 08/18/17 0500  Weight: 174 lb 2.6 oz (79 kg) 171 lb (77.6 kg)    Labs & Radiologic Studies    CBC Recent Labs  08/17/17 0911 08/18/17 0214  WBC 5.1 6.0  HGB 14.4 13.1  HCT 44.6 41.3  MCV 89.6 89.0  PLT 208 222   Basic Metabolic Panel Recent Labs    08/17/17 0911 08/18/17 0214  NA 137 140  K 4.2 4.2  CL 105 109  CO2 23 23  GLUCOSE 112* 102*  BUN 17 13  CREATININE 1.09 1.00  CALCIUM 9.1 8.4*   Liver Function Tests No results for input(s): AST, ALT, ALKPHOS, BILITOT, PROT, ALBUMIN in the last 72 hours. No results for input(s): LIPASE, AMYLASE in the last 72 hours. Cardiac Enzymes Recent Labs    08/17/17 1403 08/17/17 1933 08/18/17 0210  TROPONINI <0.03 <0.03 <0.03   BNP Invalid input(s): POCBNP D-Dimer No results for  input(s): DDIMER in the last 72 hours. Hemoglobin A1C No results for input(s): HGBA1C in the last 72 hours. Fasting Lipid Panel No results for input(s): CHOL, HDL, LDLCALC, TRIG, CHOLHDL, LDLDIRECT in the last 72 hours. Thyroid Function Tests No results for input(s): TSH, T4TOTAL, T3FREE, THYROIDAB in the last 72 hours.  Invalid input(s): FREET3 _____________  Dg Chest 2 View  Result Date: 08/17/2017 CLINICAL DATA:  67 year old male with intermittent chest pain for 3 days. Symptomatic relief with sublingual nitroglycerin. EXAM: CHEST - 2 VIEW COMPARISON:  Chest CT 08/03/2017 and earlier. FINDINGS: Stable lung volumes and mild elevation of the right hemidiaphragm. No pneumothorax, pulmonary edema, pleural effusion or confluent pulmonary opacity. Visualized tracheal air column is within normal limits. Cardiac and mediastinal contours remain normal. No acute osseous abnormality identified. Negative visible bowel gas pattern. IMPRESSION: No acute cardiopulmonary abnormality. Electronically Signed   By: Genevie Ann M.D.   On: 08/17/2017 09:34   Ct Chest Wo Contrast  Result Date: 08/03/2017 CLINICAL DATA:  Atypical chest pain. Coronary stent placed on 06/11/2017. EXAM: CT CHEST WITHOUT CONTRAST TECHNIQUE: Multidetector CT imaging of the chest was performed following the standard protocol without IV contrast. COMPARISON:  Chest x-ray dated 06/14/2017 and renal ultrasound dated 04/12/2012 FINDINGS: Cardiovascular: Heart size is normal. No pericardial effusion. Aortic atherosclerosis. Coronary stents in place in the left anterior descending artery. Mediastinum/Nodes: No enlarged mediastinal or axillary lymph nodes. Thyroid gland, trachea, and esophagus demonstrate no significant findings. Lungs/Pleura: There is bilateral peribronchial thickening with focal linear atelectasis in the right lower lobe. No consolidative infiltrates or effusions or pulmonary nodules. Upper Abdomen: 16 mm low-density lesion in the dome  of the left lobe of the liver, most likely a cyst or small hemangioma. 3.1 cm cyst in the upper pole of the left kidney, slightly increased in size since renal ultrasound dated 04/12/2012. Musculoskeletal: No chest wall mass or suspicious bone lesions identified. IMPRESSION: 1. Peribronchial thickening with slight atelectasis in the right lower lobe suggesting bronchitis. 2. No other acute abnormalities. 3. Coronary artery stents. 4.  Aortic Atherosclerosis (ICD10-I70.0). Electronically Signed   By: Lorriane Shire M.D.   On: 08/03/2017 14:21   Disposition   Pt is being discharged home today in good condition.  Follow-up Plans & Appointments    Follow-up Information    Troy Sine, MD Follow up.   Specialty:  Cardiology Why:  our office will call you with a hospital follow up visit  Contact information: [address] [place] [address] [phone]        Derinda Late, MD. Schedule an appointment as soon as possible for a visit.   Specialty:  Family Medicine Contact information: [address] [place] [address] [phone]  Discharge Instructions    Diet - low sodium heart healthy   Complete by:  As directed    Increase activity slowly   Complete by:  As directed       Discharge Medications   Allergies as of 08/18/2017      Reactions   Gluten Meal Other (See Comments)   celiac   Wheat Bran Other (See Comments)   Celiac   Statins    Leg muscle pains - rosuvastatin, atorvastatin, zetia. Tolerates simvastatin   Sulfa Antibiotics Rash      Medication List    TAKE these medications   bisoprolol 5 MG tablet Commonly known as:  ZEBETA Take 0.5 tablets (2.5 mg total) by mouth daily.   budesonide-formoterol 160-4.5 MCG/ACT inhaler Commonly known as:  SYMBICORT Inhale 2 puffs into the lungs 2 (two) times daily as needed (for cough).   clopidogrel 75 MG tablet Commonly known as:  PLAVIX Take 1 tablet (75 mg total) by  mouth daily.   guaiFENesin 600 MG 12 hr tablet Commonly known as:  MUCINEX Take 600 mg by mouth 2 (two) times daily as needed.   MULTIVITAMIN PO Take 1 tablet by mouth at bedtime.   niacin 1000 MG CR tablet Commonly known as:  NIASPAN Take 1 tablet by mouth at bedtime.   nitroGLYCERIN 0.4 MG SL tablet Commonly known as:  NITROSTAT Place 1 tablet (0.4 mg total) under the tongue every 5 (five) minutes x 3 doses as needed for chest pain.   pantoprazole 40 MG tablet Commonly known as:  PROTONIX Take 80 mg by mouth daily.   pravastatin 80 MG tablet Commonly known as:  PRAVACHOL Take 80 mg by mouth daily.   PROAIR HFA 108 (90 Base) MCG/ACT inhaler Generic drug:  albuterol Take 2 puffs by mouth 2 (two) times daily as needed. For shortness of breath.   rivaroxaban 20 MG Tabs tablet Commonly known as:  XARELTO Take 1 tablet (20 mg total) by mouth daily with supper. Start taking on:  08/19/2017   rivaroxaban 20 MG Tabs tablet Commonly known as:  XARELTO Take 1 tablet (20 mg total) by mouth daily with supper. Start taking on:  08/19/2017   Vitamin D (Ergocalciferol) 2000 units Caps Take 1 capsule by mouth at bedtime.         Outstanding Labs/Studies   None   Duration of Discharge Encounter   Greater than 30 minutes including physician time.  Signed,  Lyda Jester, PA-C 08/18/2017, 6:16 PM

## 2017-08-18 NOTE — Progress Notes (Signed)
ANTICOAGULATION CONSULT NOTE - Follow Up Consult  Pharmacy Consult for heparin Indication: PAF and USAP  Labs: Recent Labs    08/17/17 0911 08/17/17 1403 08/17/17 1933 08/18/17 0210 08/18/17 0214  HGB 14.4  --   --   --  13.1  HCT 44.6  --   --   --  41.3  PLT 208  --   --   --  176  APTT  --   --   --  79*  --   HEPARINUNFRC  --   --   --  0.71*  --   CREATININE 1.09  --   --   --  1.00  TROPONINI  --  <0.03 <0.03  --   --     Assessment/Plan:  67yo male therapeutic on heparin with initial dosing while Xarelto on hold. Will continue gtt at current rate and confirm stable with additional PTT.   Wynona Neat, PharmD, BCPS  08/18/2017,2:54 AM

## 2017-08-18 NOTE — Progress Notes (Signed)
Patient refused to watch PCI video.  He stated that he recently had a heart cath & he doesn't want to watch the video.

## 2017-08-19 ENCOUNTER — Encounter (HOSPITAL_COMMUNITY): Payer: Medicare Other

## 2017-08-19 MED FILL — Heparin Sod (Porcine)-NaCl IV Soln 1000 Unit/500ML-0.9%: INTRAVENOUS | Qty: 1000 | Status: AC

## 2017-08-21 ENCOUNTER — Encounter (HOSPITAL_COMMUNITY): Payer: Medicare Other

## 2017-08-24 ENCOUNTER — Encounter (HOSPITAL_COMMUNITY): Payer: Medicare Other

## 2017-08-24 ENCOUNTER — Encounter (HOSPITAL_COMMUNITY)
Admission: RE | Admit: 2017-08-24 | Discharge: 2017-08-24 | Disposition: A | Discharge status: Home / Self Care | Payer: Medicare Other | Source: Ambulatory Visit | Attending: Cardiovascular Disease | Admitting: Cardiovascular Disease

## 2017-08-24 DIAGNOSIS — Z955 Presence of coronary angioplasty implant and graft: Secondary | ICD-10-CM | POA: Diagnosis not present

## 2017-08-26 ENCOUNTER — Encounter (HOSPITAL_COMMUNITY): Payer: Medicare Other

## 2017-08-26 ENCOUNTER — Encounter (HOSPITAL_COMMUNITY)
Admission: RE | Admit: 2017-08-26 | Discharge: 2017-08-26 | Disposition: A | Discharge status: Home / Self Care | Payer: Medicare Other | Source: Ambulatory Visit | Attending: Cardiovascular Disease | Admitting: Cardiovascular Disease

## 2017-08-26 DIAGNOSIS — Z955 Presence of coronary angioplasty implant and graft: Secondary | ICD-10-CM

## 2017-08-26 NOTE — Progress Notes (Signed)
Reviewed home exercise with pt today.  Pt plans to walk for exercise, 2-3x/week in addition to coming to cardiac rehab.  Reviewed THR, pulse, RPE, sign and symptoms, NTG use, and when to call 911 or MD.  Also discussed weather considerations and indoor options.  Pt voiced understanding.    Dale Ribeiro,MS,ACSM RCEP 

## 2017-08-28 ENCOUNTER — Encounter (HOSPITAL_COMMUNITY)
Admission: RE | Admit: 2017-08-28 | Discharge: 2017-08-28 | Disposition: A | Discharge status: Home / Self Care | Payer: Medicare Other | Source: Ambulatory Visit | Attending: Cardiovascular Disease | Admitting: Cardiovascular Disease

## 2017-08-28 ENCOUNTER — Encounter (HOSPITAL_COMMUNITY): Payer: Medicare Other

## 2017-08-28 DIAGNOSIS — Z955 Presence of coronary angioplasty implant and graft: Secondary | ICD-10-CM

## 2017-08-31 ENCOUNTER — Encounter (HOSPITAL_COMMUNITY): Payer: Medicare Other

## 2017-08-31 ENCOUNTER — Encounter (HOSPITAL_COMMUNITY): Payer: Self-pay

## 2017-08-31 ENCOUNTER — Ambulatory Visit (INDEPENDENT_AMBULATORY_CARE_PROVIDER_SITE_OTHER): Payer: Medicare Other | Admitting: Cardiovascular Disease

## 2017-08-31 ENCOUNTER — Encounter: Payer: Self-pay | Admitting: Cardiovascular Disease

## 2017-08-31 ENCOUNTER — Encounter (HOSPITAL_COMMUNITY)
Admission: RE | Admit: 2017-08-31 | Discharge: 2017-08-31 | Disposition: A | Discharge status: Home / Self Care | Payer: Medicare Other | Source: Ambulatory Visit | Attending: Cardiovascular Disease | Admitting: Cardiovascular Disease

## 2017-08-31 VITALS — BP 98/58 | HR 66 | Ht 68.0 in | Wt 179.8 lb

## 2017-08-31 DIAGNOSIS — I200 Unstable angina: Secondary | ICD-10-CM

## 2017-08-31 DIAGNOSIS — Z955 Presence of coronary angioplasty implant and graft: Secondary | ICD-10-CM | POA: Diagnosis not present

## 2017-08-31 DIAGNOSIS — J45909 Unspecified asthma, uncomplicated: Secondary | ICD-10-CM

## 2017-08-31 DIAGNOSIS — I25119 Atherosclerotic heart disease of native coronary artery with unspecified angina pectoris: Secondary | ICD-10-CM

## 2017-08-31 DIAGNOSIS — I480 Paroxysmal atrial fibrillation: Secondary | ICD-10-CM | POA: Diagnosis not present

## 2017-08-31 DIAGNOSIS — E782 Mixed hyperlipidemia: Secondary | ICD-10-CM | POA: Diagnosis not present

## 2017-08-31 DIAGNOSIS — Z8546 Personal history of malignant neoplasm of prostate: Secondary | ICD-10-CM

## 2017-08-31 DIAGNOSIS — Z7901 Long term (current) use of anticoagulants: Secondary | ICD-10-CM | POA: Diagnosis not present

## 2017-08-31 MED ORDER — RIVAROXABAN 20 MG PO TABS: 20.0000 mg | ORAL_TABLET | Freq: Every day | ORAL | 3 refills | Status: DC

## 2017-08-31 MED ORDER — AMLODIPINE BESYLATE 2.5 MG PO TABS: 2.5000 mg | ORAL_TABLET | Freq: Every day | ORAL | 3 refills | Status: AC

## 2017-08-31 MED ORDER — BISOPROLOL FUMARATE 5 MG PO TABS: 2.5000 mg | ORAL_TABLET | Freq: Every day | ORAL | 3 refills | Status: DC

## 2017-08-31 NOTE — Patient Instructions (Signed)
Medication Instructions:  START amlodipine 2.5 mg at bedtime  Follow-Up: 2-3 months with Dr. Claiborne Billings  Any Other Special Instructions Will Be Listed Below (If Applicable).     If you need a refill on your cardiac medications before your next appointment, please call your pharmacy.

## 2017-08-31 NOTE — Progress Notes (Signed)
Patient ID: Jose Warner, male   DOB: [birth year], 67 y.o.   MRN: [id number]     PCP: Dr. Derinda Late   HPI: Jose Warner is a 67 y.o. male who presents for a  followup evaluation following his recent repeat hospitalization.  Jose Warner has a strong family history for premature coronary artery disease. In May 2006 cardiac catheterization showed very mild LAD and circumflex narrowing felt to be not flow limiting at approximately 20-30%. He has a history of low HDL levels. He developed focal prostate cancer and underwent robotic prostatectomy by Dr. Araceli Warner in 2014 When I saw him after his prostate surgery in March 2014 he was in atrial fibrillation at which time he was completely unaware of this rhythm disturbance. He was started on eliquis 5 mg twice a day anticoagulation and as well as metoprolol succinate. He subsequently converted spontaneously to sinus rhythm and has been maintaining sinus rhythm since. He remains active. He exercises daily. He denies any chest pain. He denies shortness of breath. He denies palpitations. His echo Doppler study showed an ejection fraction in the 55-65% range. He had  upper normal LA size. There is mild aortic insufficiency. Pulmonary pressures were normal with an estimated RV systolic pressure 15 mm.  In the past, he had been on Niaspan for low HDL levels. This was stopped when he was also put on cialis following his prostate surgery. He no longer takes cialis.  Laboratory done in April 2014 showed cholesterol 131 triglycerides 96 his HDL remains very low at 23 and his LDL was 89. Thyroid function studies were normal as were his hemoglobin hematocrit and chemistries.  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  He denies any episodes of chest pain.  Dr. Sandi Mariscal has put him back on his simvastatin 40 mg and niacin 1000 mg based on laboratory that he had done.  He continues to take Bystolic 5 mg for hypertension.  He  continues to have difficulty with erectile function following his prostate surgery.  He also was diagnosed with celiac disease and is extremely sensitive to gluten.   He remains active.  He is not aware of any recurrent arrhythmia.  Dr. Sandi Mariscal checks laboratory and he was told that his labs most recently were excellent.  He is tolerating Bystolic 5 mg.  He is followed by Dr. Alinda Money for his prostate.  He is on a gluten-free diet.   He was hospitalized on 06/09/2017 after having experienced intermittent chest pain  He was felt to potentially have unstableand stenting of a 95% mid LAD stenosiswithin a diffusely diseasedlcified segmen  There also was mild 40% narrowing proximal and distal to the stent.  He was discharged on 06/12/2017.  He felt vague recurrent chest pain leading to an additional overnight hospital stayon from 324.  Troponins were negative.  ECG was without ischemic changes and he was   Since he was bradycardic with heart rates in the 40s.    When I saw him for follow-up evaluation in March 2019 well and was gradually gaining his strength.  Because of his PAF, he was on Plavix and Xarelto.  He also was on bisoprolol 2.5 mg daily.  They continue to be on niacin and simvastatin per Dr. Sandi Mariscal and is on Symbicort with a history of asthma.   Since I last saw him, he developed a different type of chest pain that was lasted tense but had occurred consecutively on 3 days  in a row.  He had taken nitroglycerin with some improvement.  He was rehospitalized on August 17, 2017 and underwent repeat cardiac catheterization August 18, 2017 by Dr. Martinique.  His LAD stent was patent but he had 30% proximal, 40% mid, and 20% distal LAD stenoses.  He has felt improved.  He was treated for possible bronchitis and was given Z-Pak by Dr. Sandi Mariscal.  He presents for reevaluation.  Past Medical History:  Diagnosis Date  . Anemia   . Atrial fibrillation ([place]) 06/05/2017  . Bronchitis 05-10-12   past fall- 4 runs  antibiotics due to bronchitis-uses [employer] as needed  . Celiac disease   . Colon polyps    adenomatous  . Coronary artery disease   . Diverticulosis   . GERD (gastroesophageal reflux disease)   . Hepatitis 05-10-12   "was told non A, non B"-unclean dental equip.  Marland Kitchen Hyperlipidemia   . Iron deficiency anemia   . Prostate cancer ([place]) 05-10-12   ;bx. 04-12-12, dx  . Spasm of esophagus 2013    Past Surgical History:  Procedure Laterality Date  . CARDIAC CATHETERIZATION  09/13/2004   LAD:30%-40% in prox. to mid segment with 20% in the mid segment and 20% in left Circ., mild 20% right common iliac narrowing. medical therapy  . CARDIOVERSION  06/05/2017  . CORONARY STENT INTERVENTION N/A 06/11/2017   Procedure: CORONARY STENT INTERVENTION;  Surgeon: Belva Crome, MD;  Location: [facility];  Service: Cardiovascular;  Laterality: N/A;  . HERNIA REPAIR  5 yrs ago   right   . LEFT HEART CATH AND CORONARY ANGIOGRAPHY N/A 06/11/2017   Procedure: LEFT HEART CATH AND CORONARY ANGIOGRAPHY;  Surgeon: Belva Crome, MD;  Location: [facility];  Service: Cardiovascular;  Laterality: N/A;  . LEFT HEART CATH AND CORONARY ANGIOGRAPHY N/A 08/18/2017   Procedure: LEFT HEART CATH AND CORONARY ANGIOGRAPHY;  Surgeon: Martinique, Peter M, MD;  Location: [facility];  Service: Cardiovascular;  Laterality: N/A;  . NM MYOCAR PERF WALL MOTION  11/30/2007   protocol:Bruce, post EF 67%,mild perfusion defect seen in basal inferior consistant with Attenuation artifact, exercise cap. 12METS  . ROBOT ASSISTED LAPAROSCOPIC RADICAL PROSTATECTOMY  05/20/2012   Procedure: ROBOTIC ASSISTED LAPAROSCOPIC RADICAL PROSTATECTOMY LEVEL 1;  Surgeon: Dutch Gray, MD;  Location: [facility];  Service: Urology;  Laterality: N/A;     . TRANSURETHRAL RESECTION OF PROSTATE  33yr ago    Allergies  Allergen Reactions  . Gluten Meal Other (See Comments)    celiac  . Wheat Bran Other (See Comments)    Celiac   . Statins       Leg muscle pains - rosuvastatin, atorvastatin, zetia. Tolerates simvastatin  . Sulfa Antibiotics Rash    Current Outpatient Medications  Medication Sig Dispense Refill  . bisoprolol (ZEBETA) 5 MG tablet Take 0.5 tablets (2.5 mg total) by mouth daily. 45 tablet 3  . budesonide-formoterol (SYMBICORT) 160-4.5 MCG/ACT inhaler Inhale 2 puffs into the lungs 2 (two) times daily as needed (for cough).     . clopidogrel (PLAVIX) 75 MG tablet Take 1 tablet (75 mg total) by mouth daily. 90 tablet 3  . guaiFENesin (MUCINEX) 600 MG 12 hr tablet Take 600 mg by mouth 2 (two) times daily as needed.    . Multiple Vitamins-Minerals (MULTIVITAMIN PO) Take 1 tablet by mouth at bedtime.     . niacin (NIASPAN) 1000 MG CR tablet Take 1 tablet by mouth at bedtime.     .Marland Kitchen  nitroGLYCERIN (NITROSTAT) 0.4 MG SL tablet Place 1 tablet (0.4 mg total) under the tongue every 5 (five) minutes x 3 doses as needed for chest pain. 30 tablet 0  . pantoprazole (PROTONIX) 40 MG tablet Take 80 mg by mouth daily.     . pravastatin (PRAVACHOL) 80 MG tablet Take 80 mg by mouth daily.    Marland Kitchen PROAIR HFA 108 (90 BASE) MCG/ACT inhaler Take 2 puffs by mouth 2 (two) times daily as needed. For shortness of breath.    . rivaroxaban (XARELTO) 20 MG TABS tablet Take 1 tablet (20 mg total) by mouth daily with supper. 90 tablet 3  . Vitamin D, Ergocalciferol, 2000 units CAPS Take 1 capsule by mouth at bedtime.     Marland Kitchen amLODipine (NORVASC) 2.5 MG tablet Take 1 tablet (2.5 mg total) by mouth daily. 90 tablet 3   No current facility-administered medications for this visit.     Social History   Socioeconomic History  . Marital status: Married    Spouse name: Not on file  . Number of children: 3  . Years of education: Not on file  . Highest education level: Not on file  Occupational History    Employer: [employer]  . Financial resource strain: Not on file  . Food insecurity:    Worry: Patient refused    Inability: Patient refused   . Transportation needs:    Medical: Patient refused    Non-medical: Patient refused  Tobacco Use  . Smoking status: Never Smoker  . Smokeless tobacco: Never Used  Substance and Sexual Activity  . Alcohol use: Yes    Alcohol/week: 1.2 oz    Types: 2 Glasses of wine per week    Comment: occ. x 2 drinks weekly  . Drug use: No  . Sexual activity: Yes    Partners: Female  Lifestyle  . Physical activity:    Days per week: Not on file    Minutes per session: Not on file  . Stress: Not on file  Relationships  . Social connections:    Talks on phone: Patient refused    Gets together: Patient refused    Attends religious service: Patient refused    Active member of club or organization: Patient refused    Attends meetings of clubs or organizations: Patient refused    Relationship status: Patient refused  . Intimate partner violence:    Fear of current or ex partner: Patient refused    Emotionally abused: Patient refused    Physically abused: Patient refused    Forced sexual activity: Patient refused  Other Topics Concern  . Not on file  Social History Narrative  . Not on file    Socially, he is a PhD and Is now retired from [employer].  He had been in the turf business and floral arrangements.  ROS General: Negative; No fevers, chills, or night sweats;  HEENT: Negative; No changes in vision or hearing, sinus congestion, difficulty swallowing Pulmonary: Negative; No cough, wheezing, shortness of breath, hemoptysis Cardiovascular: see HPI GI: Positive for celiac disease GU: Positive for erectile dysfunction since his robotic prostatic surgery No dysuria, hematuria, or difficulty voiding Musculoskeletal: Negative; no myalgias, joint pain, or weakness Hematologic/Oncology: Negative; no easy bruising, bleeding Endocrine: Negative; no heat/cold intolerance; no diabetes Neuro: Negative; no changes in balance, headaches Skin: Negative; No rashes or skin lesions Psychiatric:  Negative; No behavioral problems, depression Sleep: Negative; No snoring, daytime sleepiness, hypersomnolence, bruxism, restless legs, hypnogognic hallucinations, no cataplexy Other comprehensive  14 point system review is negative.   PE BP (!) 98/58   Pulse 66   Ht 5' 8"  (1.727 m)   Wt 179 lb 12.8 oz (81.6 kg)   BMI 27.34 kg/m     Repeat blood pressure by me was 116/70 supine and 112/70 standing  Wt Readings from Last 3 Encounters:  08/31/17 179 lb 12.8 oz (81.6 kg)  08/18/17 171 lb (77.6 kg)  07/30/17 174 lb 6.1 oz (79.1 kg)   General: Alert, oriented, no distress.  Skin: normal turgor, no rashes, warm and dry HEENT: Normocephalic, atraumatic. Pupils equal round and reactive to light; sclera anicteric; extraocular muscles intact;  Nose without nasal septal hypertrophy Mouth/Parynx benign; Mallinpatti scale 2 Neck: No JVD, no carotid bruits; normal carotid upstroke Lungs: clear to ausculatation and percussion; no wheezing or rales Chest wall: without tenderness to palpitation Heart: PMI not displaced, RRR, s1 s2 normal, 1/6 systolic murmur, no diastolic murmur, no rubs, gallops, thrills, or heaves Abdomen: soft, nontender; no hepatosplenomehaly, BS+; abdominal aorta nontender and not dilated by palpation. Back: no CVA tenderness Pulses 2+ Musculoskeletal: full range of motion, normal strength, no joint deformities Extremities: no clubbing cyanosis or edema, Homan's sign negative  Neurologic: grossly nonfocal; Cranial nerves grossly wnl Psychologic: Normal mood and affect   ECG (independently read by me): Normal sinus rhythm at 66 bpm.normal sinus rhythm at 66 bpm.  No ectopy.  Normal intervals.  No ST segment depression. No ectopy.  Normal intervals.  No ST segment depression.   July 02, 2017 ECG (independently read by me): sinus bradycardia 51 bpm.  No ST segment changes.  Normal intervals.  November 2018ECG (independently read by me): Sinus bradycardia 56 bpm.  No  ectopy.  Normal intervals.  October 2017 ECG (independently read by me): Sinus bradycardia 59 bpm.  Normal intervals.  No ectopy.  May 2015 ECG (and apparently read by me): Normal sinus rhythm at 60 beats per minute.  Early transition.  No ectopy.  PR interval 174 ms, QTc interval 460 ms.  ECG: (independently read by me): Sinus rhythm at 56 beats per minute. No ectopy. Normal intervals.  LABS:  BMP Latest Ref Rng & Units 08/18/2017 08/17/2017 06/15/2017  Glucose 65 - 99 mg/dL 102(H) 112(H) 108(H)  BUN 6 - 20 mg/dL 13 17 12   Creatinine 0.61 - 1.24 mg/dL 1.00 1.09 0.92  Sodium 135 - 145 mmol/L 140 137 136  Potassium 3.5 - 5.1 mmol/L 4.2 4.2 4.0  Chloride 101 - 111 mmol/L 109 105 106  CO2 22 - 32 mmol/L 23 23 21(L)  Calcium 8.9 - 10.3 mg/dL 8.4(L) 9.1 8.6(L)    Hepatic Function Panel     Component Value Date/Time   PROT 5.7 (L) 06/12/2017 0804     CBC Latest Ref Rng & Units 08/18/2017 08/17/2017 06/14/2017  WBC 4.0 - 10.5 K/uL 6.0 5.1 6.8  Hemoglobin 13.0 - 17.0 g/dL 13.1 14.4 14.8  Hematocrit 39.0 - 52.0 % 41.3 44.6 44.7  Platelets 150 - 400 K/uL 176 208 187    BNP No results found for: PROBNP  Lipid Panel     Component Value Date/Time   CHOL 122 06/10/2017 0108     RADIOLOGY: No results found.  IMPRESSION:  1. Coronary artery disease involving native coronary artery of native heart with angina pectoris (HCC)   2. Paroxysmal atrial fibrillation ([place])   3. Anticoagulation adequate   4. Mixed hyperlipidemia   5. Mild asthma without complication, unspecified whether persistent   6.  History of prostate cancer     ASSESSMENT AND PLAN: Mr. Nuon Is a 67 year-old gentleman who had undergone cardiac catheterization in May 2006 and was found to have mild nonobstructive CAD.  He has a history of hyperlipidemia with very low HDL levels and is back on niacin in addition to his simvastatin 40 mg followed by Dr. Sandi Mariscal.    He developed atrial fibrillation for which he was  unaware following his robotic prostatectomy for his prostate cancer.  He has been maintaining sinus rhythm without recurrent atrial fibrillation.  He was hospitalized in February 2019 with intermittent chest pain.  Due to worrisome symptoms of unstable angina, catheterization was performed which revealed a 95% mid LAD stenosis and a calcified segment.  Had mild additional concomitant CAD.  Initially received triple therapy but ultimately aspirin was discontinued.  He developed repeat chest pain leading to repeat catheterization on August 18, 2017.  This chest pain was less intense character but also did seem to be somewhat nitrate responsive.  He also took a Z-Pak for potential treatment of bronchitis.  Certainly possible that he may have had some mild spasm on top of his additional mild LAD disease.  His blood pressure on recheck by me was stable.  I have elected to add amlodipine 2.5 mg to take at bedtime to see if this improves some of his symptomatology.  Initially in February, he is chest pain was during the night and had awakened him from sleep.  He denies any significant sleep issues.  He is unaware of any snoring.  He continues to be on Plavix and Xarelto.  There is no bleeding.  He is now on niacin and pravastatin followed by Dr. Sandi Mariscal who checks his laboratory.  He is participating in cardiac rehabilitation.  I will see him in 2 to 3 months for reevaluation or sooner depending upon symptoms status.  Time spent: 25 minutes Jose Sine, MD, [facility]  09/01/2017  7:57 AM

## 2017-09-01 ENCOUNTER — Encounter: Payer: Self-pay | Admitting: Cardiovascular Disease

## 2017-09-01 NOTE — Addendum Note (Signed)
Addended by: Zebedee Iba on: 09/01/2017 09:25 AM   Modules accepted: Orders

## 2017-09-02 ENCOUNTER — Encounter (HOSPITAL_COMMUNITY)
Admission: RE | Admit: 2017-09-02 | Discharge: 2017-09-02 | Disposition: A | Discharge status: Home / Self Care | Payer: Medicare Other | Source: Ambulatory Visit | Attending: Cardiovascular Disease | Admitting: Cardiovascular Disease

## 2017-09-02 ENCOUNTER — Encounter (HOSPITAL_COMMUNITY): Payer: Medicare Other

## 2017-09-02 DIAGNOSIS — Z955 Presence of coronary angioplasty implant and graft: Secondary | ICD-10-CM | POA: Diagnosis not present

## 2017-09-03 ENCOUNTER — Other Ambulatory Visit: Payer: Self-pay | Admitting: Cardiovascular Disease

## 2017-09-03 ENCOUNTER — Telehealth: Payer: Self-pay | Admitting: *Deleted

## 2017-09-03 ENCOUNTER — Other Ambulatory Visit: Payer: Self-pay

## 2017-09-03 NOTE — Progress Notes (Signed)
Cardiac Individual Treatment Plan  Patient Details  Name: Jose Warner MRN: [id number] Date of Birth: 1951-02-01 Referring Provider:   Flowsheet Row CARDIAC REHAB PHASE II ORIENTATION from 07/30/2017 in [place]  Referring Provider  Shelva Majestic, MD      Initial Encounter Date:  [place] PHASE II ORIENTATION from 07/30/2017 in [place]  Date  07/30/17  Referring Provider  Shelva Majestic, MD      Visit Diagnosis: Status post coronary artery stent placement  Patient's Home Medications on Admission:  Current Outpatient Medications:  .  amLODipine (NORVASC) 2.5 MG tablet, Take 1 tablet (2.5 mg total) by mouth daily., Disp: 90 tablet, Rfl: 3 .  bisoprolol (ZEBETA) 5 MG tablet, Take 0.5 tablets (2.5 mg total) by mouth daily., Disp: 45 tablet, Rfl: 3 .  budesonide-formoterol (SYMBICORT) 160-4.5 MCG/ACT inhaler, Inhale 2 puffs into the lungs 2 (two) times daily as needed (for cough). , Disp: , Rfl:  .  clopidogrel (PLAVIX) 75 MG tablet, Take 1 tablet (75 mg total) by mouth daily., Disp: 90 tablet, Rfl: 3 .  guaiFENesin (MUCINEX) 600 MG 12 hr tablet, Take 600 mg by mouth 2 (two) times daily as needed., Disp: , Rfl:  .  Multiple Vitamins-Minerals (MULTIVITAMIN PO), Take 1 tablet by mouth at bedtime. , Disp: , Rfl:  .  niacin (NIASPAN) 1000 MG CR tablet, Take 1 tablet by mouth at bedtime. , Disp: , Rfl:  .  nitroGLYCERIN (NITROSTAT) 0.4 MG SL tablet, Place 1 tablet (0.4 mg total) under the tongue every 5 (five) minutes x 3 doses as needed for chest pain., Disp: 30 tablet, Rfl: 0 .  pantoprazole (PROTONIX) 40 MG tablet, Take 80 mg by mouth daily. , Disp: , Rfl:  .  pravastatin (PRAVACHOL) 80 MG tablet, Take 80 mg by mouth daily., Disp: , Rfl:  .  PROAIR HFA 108 (90 BASE) MCG/ACT inhaler, Take 2 puffs by mouth 2 (two) times daily as needed. For shortness of breath., Disp: , Rfl:  .  rivaroxaban (XARELTO) 20  MG TABS tablet, Take 1 tablet (20 mg total) by mouth daily with supper., Disp: 90 tablet, Rfl: 3 .  Vitamin D, Ergocalciferol, 2000 units CAPS, Take 1 capsule by mouth at bedtime. , Disp: , Rfl:   Past Medical History: Past Medical History:  Diagnosis Date  . Anemia   . Atrial fibrillation ([place]) 06/05/2017  . Bronchitis 05-10-12   past fall- 4 runs antibiotics due to bronchitis-uses [employer] as needed  . Celiac disease   . Colon polyps    adenomatous  . Coronary artery disease   . Diverticulosis   . GERD (gastroesophageal reflux disease)   . Hepatitis 05-10-12   "was told non A, non B"-unclean dental equip.  [place] Hyperlipidemia   . Iron deficiency anemia   . Prostate cancer ([place]) 05-10-12   ;bx. 04-12-12, dx  . Spasm of esophagus 2013    Tobacco Use: Social History   Tobacco Use  Smoking Status Never Smoker  Smokeless Tobacco Never Used    Labs: Recent Review Scientist, physiological    Labs for ITP Cardiac and Pulmonary Rehab Latest Ref Rng & Units 06/10/2017   Cholestrol 0 - 200 mg/dL 122   LDLCALC 0 - 99 mg/dL 60   HDL >40 mg/dL 41   Trlycerides <150 mg/dL 103   Hemoglobin A1c 4.8 - 5.6 % 6.1(H)      Capillary Blood Glucose: No results found  for: GLUCAP   Exercise Target Goals:    Exercise Program Goal: Individual exercise prescription set using results from initial 6 min walk test and THRR while considering  patient's activity barriers and safety.   Exercise Prescription Goal: Initial exercise prescription builds to 30-45 minutes a day of aerobic activity, 2-3 days per week.  Home exercise guidelines will be given to patient during program as part of exercise prescription that the participant will acknowledge.  Activity Barriers & Risk Stratification: Activity Barriers & Cardiac Risk Stratification - 07/30/17 0957    Activity Barriers & Cardiac Risk Stratification          Activity Barriers  Other (comment)    Comments  R knee discomfort    Cardiac Risk Stratification   Moderate           6 Minute Walk: 6 Minute Walk    6 Minute Walk    Row Name 07/30/17 0954   Phase  Initial   Distance  2160 feet   Walk Time  6 minutes   # of Rest Breaks  0   MPH  4.1   METS  4.8   RPE  12   VO2 Peak  16.8   Symptoms  No   Resting HR  67 bpm   Resting BP  110/60   Resting Oxygen Saturation   99 %   Exercise Oxygen Saturation  during 6 min walk  99 %   Max Ex. HR  105 bpm   Max Ex. BP  132/60   2 Minute Post BP  118/72          Oxygen Initial Assessment:   Oxygen Re-Evaluation:   Oxygen Discharge (Final Oxygen Re-Evaluation):   Initial Exercise Prescription: Initial Exercise Prescription - 07/30/17 1000    Date of Initial Exercise RX and Referring Provider          Date  07/30/17    Referring Provider  Shelva Majestic, MD        Treadmill          MPH  3    Grade  2    Minutes  10    METs  4.12        Bike          Level  1.2    Minutes  10    METs  3.83        NuStep          Level  4    SPM  80    Minutes  10    METs  3.5        Prescription Details          Frequency (times per week)  3    Duration  Progress to 30 minutes of continuous aerobic without signs/symptoms of physical distress        Intensity          THRR 40-80% of Max Heartrate  62-124    Ratings of Perceived Exertion  11-13    Perceived Dyspnea  0-4        Progression          Progression  Continue to progress workloads to maintain intensity without signs/symptoms of physical distress.        Resistance Training          Training Prescription  Yes    Weight  5lbs    Reps  10-15  Perform Capillary Blood Glucose checks as needed.  Exercise Prescription Changes: Exercise Prescription Changes    Response to Exercise    Row Name 08/03/17 1100 08/24/17 1030 09/02/17 1000   Blood Pressure (Admit)  112/62  122/64  110/62   Blood Pressure (Exercise)  168/86  138/60  138/62   Blood Pressure (Exit)  100/66  100/70  108/66    Heart Rate (Admit)  62 bpm  63 bpm  59 bpm   Heart Rate (Exercise)  113 bpm  118 bpm  115 bpm   Heart Rate (Exit)  68 bpm  63 bpm  67 bpm   Rating of Perceived Exertion (Exercise)  _0 Symptoms  none  none  none   Comments  pt oriented to exercise equipment today  no documentation  no documentation   Duration  Progress to 30 minutes of  aerobic without signs/symptoms of physical distress  Progress to 30 minutes of  aerobic without signs/symptoms of physical distress  Progress to 30 minutes of  aerobic without signs/symptoms of physical distress   Intensity  THRR unchanged  THRR unchanged  THRR unchanged       Progression    Row Name 08/03/17 1100 08/24/17 1030 09/02/17 1000   Progression  Continue to progress workloads to maintain intensity without signs/symptoms of physical distress.  Continue to progress workloads to maintain intensity without signs/symptoms of physical distress.  Continue to progress workloads to maintain intensity without signs/symptoms of physical distress.   Average METs  4.3  4.7  4.9       Resistance Training    Row Name 08/03/17 1100 08/24/17 1030 09/02/17 1000   Training Prescription  Yes  Yes  No relaxation day   Weight  5lbs  5lbs  no documentation   Reps  10-15  10-15  no documentation   Time  10 Minutes  10 Minutes  10 Minutes       Treadmill    Row Name 08/03/17 1100 08/24/17 1030 09/02/17 1000   MPH  _1 Grade  _2 Minutes  _3 METs  4.12  4.12  4.12       Bike    Row Name 08/03/17 1100 08/24/17 1030 09/02/17 1000   Level  1.2  1.2  1.2   Minutes  _4 METs  3.87  3.87  3.82       NuStep    Row Name 08/03/17 1100 08/24/17 1030 09/02/17 1000   Level  _5 SPM  80  110  130   Minutes  _6 METs  4.8  6.1  6.9       Home Exercise Plan    [place] Name 08/03/17 1100 08/24/17 1030 09/02/17 1000   Plans to continue exercise at  no documentation  no documentation  Home (comment)   Frequency  no  documentation  no documentation  Add 2 additional days to program exercise sessions.   Initial Home Exercises Provided  no documentation  no documentation  08/26/17          Exercise Comments: Exercise Comments    Row Name 08/03/17 1137 08/26/17 1112 09/01/17 1503   Exercise Comments  Pt completed first session of cardiac rehab. Pt denied symptoms of CP, dizziness or SOB. Pt was  able to exercise safely in group environment.   Reviewed METs, goals and HEP. Pt is progressing well in cardiac rehab. Rehab staff will continue to monitor activity levels and progress as tolerated.  Reviewed METs, goals and HEP. Pt is progressing well in cardiac rehab. Rehab staff will continue to monitor activity levels and progress as tolerated.      Exercise Goals and Review: Exercise Goals    Exercise Goals    Row Name 07/30/17 0857   Increase Physical Activity  Yes   Intervention  Provide advice, education, support and counseling about physical activity/exercise needs.;Develop an individualized exercise prescription for aerobic and resistive training based on initial evaluation findings, risk stratification, comorbidities and participant's personal goals.   Expected Outcomes  Short Term: Attend rehab on a regular basis to increase amount of physical activity.;Long Term: Exercising regularly at least 3-5 days a week.   Increase Strength and Stamina  Yes   Intervention  Provide advice, education, support and counseling about physical activity/exercise needs.;Develop an individualized exercise prescription for aerobic and resistive training based on initial evaluation findings, risk stratification, comorbidities and participant's personal goals.   Expected Outcomes  Short Term: Increase workloads from initial exercise prescription for resistance, speed, and METs.;Short Term: Perform resistance training exercises routinely during rehab and add in resistance training at home;Long Term: Improve cardiorespiratory  fitness, muscular endurance and strength as measured by increased METs and functional capacity (6MWT)   Able to understand and use rate of perceived exertion (RPE) scale  Yes   Intervention  Provide education and explanation on how to use RPE scale   Expected Outcomes  Short Term: Able to use RPE daily in rehab to express subjective intensity level;Long Term:  Able to use RPE to guide intensity level when exercising independently   Knowledge and understanding of Target Heart Rate Range (THRR)  Yes   Intervention  Provide education and explanation of THRR including how the numbers were predicted and where they are located for reference   Expected Outcomes  Short Term: Able to state/look up THRR;Long Term: Able to use THRR to govern intensity when exercising independently;Short Term: Able to use daily as guideline for intensity in rehab   Able to check pulse independently  Yes   Intervention  Provide education and demonstration on how to check pulse in carotid and radial arteries.;Review the importance of being able to check your own pulse for safety during independent exercise   Expected Outcomes  Short Term: Able to explain why pulse checking is important during independent exercise;Long Term: Able to check pulse independently and accurately   Understanding of Exercise Prescription  Yes   Intervention  Provide education, explanation, and written materials on patient's individual exercise prescription   Expected Outcomes  Short Term: Able to explain program exercise prescription;Long Term: Able to explain home exercise prescription to exercise independently          Exercise Goals Re-Evaluation : Exercise Goals Re-Evaluation    Exercise Goal Re-Evaluation    Row Name 08/04/17 1346 08/26/17 1113 09/01/17 1503   Exercise Goals Review  Increase Physical Activity;Able to understand and use rate of perceived exertion (RPE) scale;Understanding of Exercise Prescription  Increase Physical Activity;Able  to understand and use rate of perceived exertion (RPE) scale;Knowledge and understanding of Target Heart Rate Range (THRR);Understanding of Exercise Prescription;Increase Strength and Stamina;Able to check pulse independently  Increase Physical Activity;Able to understand and use rate of perceived exertion (RPE) scale;Knowledge and understanding of Target Heart Rate Range (THRR);Understanding  of Exercise Prescription;Increase Strength and Stamina;Able to check pulse independently   Comments  Pt understands exercise flow and program. Pt is also familiar with RPE scale and uses it correctly.  Pt has progressed WL's and MET levels. Pt is tolerating exercise program very well and has noticed an improvement in strength and stamina.  Pt has progressed WL's and MET levels. Pt is tolerating exercise program very well and has noticed an improvement in strength and stamina.   Expected Outcomes  Pt will be able to improve in cardiorespiratory fitness and functional capacity.  Pt will be able to improve in cardiorespiratory fitness and functional capacity.  Pt will be able to improve in cardiorespiratory fitness and functional capacity.           Discharge Exercise Prescription (Final Exercise Prescription Changes): Exercise Prescription Changes - 09/02/17 1000    Response to Exercise          Blood Pressure (Admit)  110/62    Blood Pressure (Exercise)  138/62    Blood Pressure (Exit)  108/66    Heart Rate (Admit)  59 bpm    Heart Rate (Exercise)  115 bpm    Heart Rate (Exit)  67 bpm    Rating of Perceived Exertion (Exercise)  12    Symptoms  none    Duration  Progress to 30 minutes of  aerobic without signs/symptoms of physical distress    Intensity  THRR unchanged        Progression          Progression  Continue to progress workloads to maintain intensity without signs/symptoms of physical distress.    Average METs  4.9        Resistance Training          Training Prescription  No relaxation  day    Time  10 Minutes        Treadmill          MPH  3    Grade  2    Minutes  10    METs  4.12        Bike          Level  1.2    Minutes  10    METs  3.82        NuStep          Level  5    SPM  130    Minutes  10    METs  6.9        Home Exercise Plan          Plans to continue exercise at  Home (comment)    Frequency  Add 2 additional days to program exercise sessions.    Initial Home Exercises Provided  08/26/17           Nutrition:  Target Goals: Understanding of nutrition guidelines, daily intake of sodium <1572m, cholesterol <2037m calories 30% from fat and 7% or less from saturated fats, daily to have 5 or more servings of fruits and vegetables.  Biometrics: Pre Biometrics - 07/30/17 1018    Pre Biometrics          Height  _0  (1.727 m)    Weight  174 lb 6.1 oz (79.1 kg)    Waist Circumference  38 inches    Hip Circumference  39.75 inches    Waist to Hip Ratio  0.96 %    BMI (Calculated)  26.52    Triceps Skinfold  21  mm    % Body Fat  27.5 %    Grip Strength  42 kg    Flexibility  13 in    Single Leg Stand  30 seconds            Nutrition Therapy Plan and Nutrition Goals: Nutrition Therapy & Goals - 07/30/17 1014    Nutrition Therapy          Diet  Heart Healthy, Gluten Free        Personal Nutrition Goals          Nutrition Goal  Pt to identify food quantities necessary to achieve weight loss of 6-10 lb at graduation from cardiac rehab. Goal wt of 160 lb desired.    Personal Goal #2  Pt able to name foods that affect blood glucose.    Personal Goal #3  Pt to describe the benefit of including fruits, vegetables, whole grains, and low-fat dairy products in a heart healthy meal plan.        Intervention Plan          Intervention  Prescribe, educate and counsel regarding individualized specific dietary modifications aiming towards targeted core components such as weight, hypertension, lipid management, diabetes, heart failure  and other comorbidities.    Expected Outcomes  Short Term Goal: Understand basic principles of dietary content, such as calories, fat, sodium, cholesterol and nutrients.;Long Term Goal: Adherence to prescribed nutrition plan.           Nutrition Assessments: Nutrition Assessments - 07/30/17 1015    MEDFICTS Scores          Pre Score  61           Nutrition Goals Re-Evaluation:   Nutrition Goals Re-Evaluation:   Nutrition Goals Discharge (Final Nutrition Goals Re-Evaluation):   Psychosocial: Target Goals: Acknowledge presence or absence of significant depression and/or stress, maximize coping skills, provide positive support system. Participant is able to verbalize types and ability to use techniques and skills needed for reducing stress and depression.  Initial Review & Psychosocial Screening: Initial Psych Review & Screening - 07/30/17 1102    Initial Review          Current issues with  None Identified        Family Dynamics          Good Support System?  Yes Pt states that he has the support of his spouse, family, and friends.         Barriers          Psychosocial barriers to participate in program  There are no identifiable barriers or psychosocial needs.        Screening Interventions          Interventions  Encouraged to exercise    Expected Outcomes  Short Term goal: Identification and review with participant of any Quality of Life or Depression concerns found by scoring the questionnaire.;Long Term goal: The participant improves quality of Life and PHQ9 Scores as seen by post scores and/or verbalization of changes           Quality of Life Scores: Quality of Life - 07/30/17 1042    Quality of Life Scores          Health/Function Pre  25.4 %    Socioeconomic Pre  27.19 %    Psych/Spiritual Pre  25.21 %    Family Pre  29.5 %    GLOBAL Pre  26.36 %  Scores of 19 and below usually indicate a poorer quality of life in these areas.  A  difference of  2-3 points is a clinically meaningful difference.  A difference of 2-3 points in the total score of the Quality of Life Index has been associated with significant improvement in overall quality of life, self-image, physical symptoms, and general health in studies assessing change in quality of life.  PHQ-9: Recent Review Flowsheet Data    Depression screen [facility] 2/9 08/03/2017   Decreased Interest 0   Down, Depressed, Hopeless 0   PHQ - 2 Score 0     Interpretation of Total Score  Total Score Depression Severity:  1-4 = Minimal depression, 5-9 = Mild depression, 10-14 = Moderate depression, 15-19 = Moderately severe depression, 20-27 = Severe depression   Psychosocial Evaluation and Intervention: Psychosocial Evaluation - 08/03/17 1004    Psychosocial Evaluation & Interventions          Interventions  Encouraged to exercise with the program and follow exercise prescription;Relaxation education;Stress management education    Comments  Pt denies any psychosocial needs.  Will periodically check in with pt for continued mental well being.    Expected Outcomes  Pt will demonstrate approriate coping skills and good overall outlook.      Continue Psychosocial Services   Follow up required by staff           Psychosocial Re-Evaluation: Psychosocial Re-Evaluation    Psychosocial Re-Evaluation    [place] Name 08/03/17 1005 08/31/17 1710   Current issues with  None Identified  None Identified   Comments  no documentation  no psychosocial needs identifed, no interventions necessary   Expected Outcomes  no documentation  pt will exhibit positive outlook with good coping skills.    Interventions  Relaxation education;Encouraged to attend Cardiac Rehabilitation for the exercise;Stress management education  Relaxation education;Encouraged to attend Cardiac Rehabilitation for the exercise;Stress management education   Continue Psychosocial Services   Follow up required by staff  no  documentation          Psychosocial Discharge (Final Psychosocial Re-Evaluation): Psychosocial Re-Evaluation - 08/31/17 1710    Psychosocial Re-Evaluation          Current issues with  None Identified    Comments  no psychosocial needs identifed, no interventions necessary    Expected Outcomes  pt will exhibit positive outlook with good coping skills.     Interventions  Relaxation education;Encouraged to attend Cardiac Rehabilitation for the exercise;Stress management education           Vocational Rehabilitation: Provide vocational rehab assistance to qualifying candidates.   Vocational Rehab Evaluation & Intervention: Vocational Rehab - 07/30/17 1059    Initial Vocational Rehab Evaluation & Intervention          Assessment shows need for Vocational Rehabilitation  No           Education: Education Goals: Education classes will be provided on a weekly basis, covering required topics. Participant will state understanding/return demonstration of topics presented.  Learning Barriers/Preferences: Learning Barriers/Preferences - 07/30/17 0842    Learning Barriers/Preferences          Learning Barriers  Sight    Learning Preferences  Skilled Demonstration;Written Material;Computer/Internet           Education Topics: Count Your Pulse:  -Group instruction provided by verbal instruction, demonstration, patient participation and written materials to support subject.  Instructors address importance of being able to find your pulse and how to count your  pulse when at home without a heart monitor.  Patients get hands on experience counting their pulse with staff help and individually.   Heart Attack, Angina, and Risk Factor Modification:  -Group instruction provided by verbal instruction, video, and written materials to support subject.  Instructors address signs and symptoms of angina and heart attacks.    Also discuss risk factors for heart disease and how to make changes  to improve heart health risk factors.   Functional Fitness:  -Group instruction provided by verbal instruction, demonstration, patient participation, and written materials to support subject.  Instructors address safety measures for doing things around the house.  Discuss how to get up and down off the floor, how to pick things up properly, how to safely get out of a chair without assistance, and balance training.   Meditation and Mindfulness:  -Group instruction provided by verbal instruction, patient participation, and written materials to support subject.  Instructor addresses importance of mindfulness and meditation practice to help reduce stress and improve awareness.  Instructor also leads participants through a meditation exercise.  Flowsheet Row CARDIAC REHAB PHASE II EXERCISE from 09/02/2017 in [place]  Date  09/02/17  Instruction Review Code  2- Demonstrated Understanding      Stretching for Flexibility and Mobility:  -Group instruction provided by verbal instruction, patient participation, and written materials to support subject.  Instructors lead participants through series of stretches that are designed to increase flexibility thus improving mobility.  These stretches are additional exercise for major muscle groups that are typically performed during regular warm up and cool down.   Hands Only CPR:  -Group verbal, video, and participation provides a basic overview of AHA guidelines for community CPR. Role-play of emergencies allow participants the opportunity to practice calling for help and chest compression technique with discussion of AED use.   Hypertension: -Group verbal and written instruction that provides a basic overview of hypertension including the most recent diagnostic guidelines, risk factor reduction with self-care instructions and medication management.    Nutrition I class: Heart Healthy Eating:  -Group instruction provided  by PowerPoint slides, verbal discussion, and written materials to support subject matter. The instructor gives an explanation and review of the Therapeutic Lifestyle Changes diet recommendations, which includes a discussion on lipid goals, dietary fat, sodium, fiber, plant stanol/sterol esters, sugar, and the components of a well-balanced, healthy diet.   Nutrition II class: Lifestyle Skills:  -Group instruction provided by PowerPoint slides, verbal discussion, and written materials to support subject matter. The instructor gives an explanation and review of label reading, grocery shopping for heart health, heart healthy recipe modifications, and ways to make healthier choices when eating out.   Diabetes Question & Answer:  -Group instruction provided by PowerPoint slides, verbal discussion, and written materials to support subject matter. The instructor gives an explanation and review of diabetes co-morbidities, pre- and post-prandial blood glucose goals, pre-exercise blood glucose goals, signs, symptoms, and treatment of hypoglycemia and hyperglycemia, and foot care basics. Flowsheet Row CARDIAC REHAB PHASE II EXERCISE from 09/02/2017 in [place]  Date  08/28/17  Educator  RD  Instruction Review Code  2- Demonstrated Understanding      Diabetes Blitz:  -Group instruction provided by PowerPoint slides, verbal discussion, and written materials to support subject matter. The instructor gives an explanation and review of the physiology behind type 1 and type 2 diabetes, diabetes medications and rational behind using different medications, pre- and post-prandial blood  glucose recommendations and Hemoglobin A1c goals, diabetes diet, and exercise including blood glucose guidelines for exercising safely.    Portion Distortion:  -Group instruction provided by PowerPoint slides, verbal discussion, written materials, and food models to support subject matter. The  instructor gives an explanation of serving size versus portion size, changes in portions sizes over the last 20 years, and what consists of a serving from each food group.   Stress Management:  -Group instruction provided by verbal instruction, video, and written materials to support subject matter.  Instructors review role of stress in heart disease and how to cope with stress positively.     Exercising on Your Own:  -Group instruction provided by verbal instruction, power point, and written materials to support subject.  Instructors discuss benefits of exercise, components of exercise, frequency and intensity of exercise, and end points for exercise.  Also discuss use of nitroglycerin and activating EMS.  Review options of places to exercise outside of rehab.  Review guidelines for sex with heart disease.   Cardiac Drugs I:  -Group instruction provided by verbal instruction and written materials to support subject.  Instructor reviews cardiac drug classes: antiplatelets, anticoagulants, beta blockers, and statins.  Instructor discusses reasons, side effects, and lifestyle considerations for each drug class. Flowsheet Row CARDIAC REHAB PHASE II EXERCISE from 09/02/2017 in [place]  Date  08/26/17  Instruction Review Code  2- Demonstrated Understanding      Cardiac Drugs II:  -Group instruction provided by verbal instruction and written materials to support subject.  Instructor reviews cardiac drug classes: angiotensin converting enzyme inhibitors (ACE-I), angiotensin II receptor blockers (ARBs), nitrates, and calcium channel blockers.  Instructor discusses reasons, side effects, and lifestyle considerations for each drug class.   Anatomy and Physiology of the Circulatory System:  Group verbal and written instruction and models provide basic cardiac anatomy and physiology, with the coronary electrical and arterial systems. Review of: AMI, Angina, Valve  disease, Heart Failure, Peripheral Artery Disease, Cardiac Arrhythmia, Pacemakers, and the ICD.   Other Education:  -Group or individual verbal, written, or video instructions that support the educational goals of the cardiac rehab program.   Holiday Eating Survival Tips:  -Group instruction provided by PowerPoint slides, verbal discussion, and written materials to support subject matter. The instructor gives patients tips, tricks, and techniques to help them not only survive but enjoy the holidays despite the onslaught of food that accompanies the holidays.   Knowledge Questionnaire Score: Knowledge Questionnaire Score - 07/30/17 1038    Knowledge Questionnaire Score          Pre Score  19/24           Core Components/Risk Factors/Patient Goals at Admission: Personal Goals and Risk Factors at Admission - 07/30/17 0858    Core Components/Risk Factors/Patient Goals on Admission           Weight Management  Yes;Weight Maintenance;Weight Loss    Intervention  Weight Management: Develop a combined nutrition and exercise program designed to reach desired caloric intake, while maintaining appropriate intake of nutrient and fiber, sodium and fats, and appropriate energy expenditure required for the weight goal.;Weight Management: Provide education and appropriate resources to help participant work on and attain dietary goals.;Weight Management/Obesity: Establish reasonable short term and long term weight goals.    Admit Weight  174 lb 6.1 oz (79.1 kg)    Goal Weight: Short Term  164 lb (74.4 kg)    Goal Weight: Long Term  164  lb (74.4 kg)    Expected Outcomes  Short Term: Continue to assess and modify interventions until short term weight is achieved;Weight Maintenance: Understanding of the daily nutrition guidelines, which includes 25-35% calories from fat, 7% or less cal from saturated fats, less than 263m cholesterol, less than 1.5gm of sodium, & 5 or more servings of fruits and vegetables  daily;Weight Loss: Understanding of general recommendations for a balanced deficit meal plan, which promotes 1-2 lb weight loss per week and includes a negative energy balance of [phone] kcal/d;Understanding recommendations for meals to include 15-35% energy as protein, 25-35% energy from fat, 35-60% energy from carbohydrates, less than 2052mof dietary cholesterol, 20-35 gm of total fiber daily;Understanding of distribution of calorie intake throughout the day with the consumption of 4-5 meals/snacks    Lipids  Yes    Intervention  Provide education and support for participant on nutrition & aerobic/resistive exercise along with prescribed medications to achieve LDL <7073mHDL >59m75m  Expected Outcomes  Short Term: Participant states understanding of desired cholesterol values and is compliant with medications prescribed. Participant is following exercise prescription and nutrition guidelines.;Long Term: Cholesterol controlled with medications as prescribed, with individualized exercise RX and with personalized nutrition plan. Value goals: LDL < 70mg70mL > 40 mg.           Core Components/Risk Factors/Patient Goals Review:  Goals and Risk Factor Review    Core Components/Risk Factors/Patient Goals Review    Row Name 08/31/17 1708   Personal Goals Review  Weight Management/Obesity;Lipids   Review  pt with CAD RF demonstrates eagerness to participate in CR program. pt encouraged to attend exercise, nutrition and lifestyle modificaiton education classes.     Expected Outcomes  pt will participate in CR exercise nutrition and lifestyle modification classes.           Core Components/Risk Factors/Patient Goals at Discharge (Final Review):  Goals and Risk Factor Review - 08/31/17 1708    Core Components/Risk Factors/Patient Goals Review          Personal Goals Review  Weight Management/Obesity;Lipids    Review  pt with CAD RF demonstrates eagerness to participate in CR program. pt encouraged  to attend exercise, nutrition and lifestyle modificaiton education classes.      Expected Outcomes  pt will participate in CR exercise nutrition and lifestyle modification classes.            ITP Comments: ITP Comments    Row Name 07/30/17 0838 [phone]   ITP Comments  Dr. TraciFransico Himical Director  30 day ITP review. pt with good attendance and participation. pt demonstrates eagerness to participate in CR program.       Comments:

## 2017-09-03 NOTE — Telephone Encounter (Signed)
Patient left a msg on the refill vm requesting a short term rx for the xarelto to last until his mail order shipment arrives. He did not specify which pharmacy. Call back number provided was [phone]. Thanks, MI

## 2017-09-04 ENCOUNTER — Encounter (HOSPITAL_COMMUNITY): Payer: Medicare Other

## 2017-09-04 ENCOUNTER — Encounter (HOSPITAL_COMMUNITY)
Admission: RE | Admit: 2017-09-04 | Discharge: 2017-09-04 | Disposition: A | Discharge status: Home / Self Care | Payer: Medicare Other | Source: Ambulatory Visit | Attending: Cardiovascular Disease | Admitting: Cardiovascular Disease

## 2017-09-04 DIAGNOSIS — Z955 Presence of coronary angioplasty implant and graft: Secondary | ICD-10-CM

## 2017-09-04 NOTE — Telephone Encounter (Signed)
Sent to local pharmacy on 5/16

## 2017-09-07 ENCOUNTER — Telehealth: Payer: Self-pay | Admitting: Cardiovascular Disease

## 2017-09-07 ENCOUNTER — Encounter (HOSPITAL_COMMUNITY)
Admission: RE | Admit: 2017-09-07 | Discharge: 2017-09-07 | Disposition: A | Discharge status: Home / Self Care | Payer: Medicare Other | Source: Ambulatory Visit | Attending: Cardiovascular Disease | Admitting: Cardiovascular Disease

## 2017-09-07 ENCOUNTER — Encounter (HOSPITAL_COMMUNITY): Payer: Medicare Other

## 2017-09-07 DIAGNOSIS — Z955 Presence of coronary angioplasty implant and graft: Secondary | ICD-10-CM | POA: Diagnosis not present

## 2017-09-07 NOTE — Telephone Encounter (Signed)
[facility] has been made aware that we do not have record of the patient being on Simvastatin. He is currently on pravastatin according to our records.

## 2017-09-07 NOTE — Telephone Encounter (Signed)
New Message   Pt c/o medication issue:  1. Name of Medication: amLODipine (NORVASC) 2.5 MG tablet and simviastatin 2. How are you currently taking this medication (dosage and times per day)?  3. Are you having a reaction (difficulty breathing--STAT)?   4. What is your medication issue? Jose Warner with [facility] is calling in reference to possible drug interactions with the medication. They want to know if they should still fill the prescription  They want to know if they need to fill the amlodipine. Please use reference number [id number]

## 2017-09-09 ENCOUNTER — Encounter (HOSPITAL_COMMUNITY)
Admission: RE | Admit: 2017-09-09 | Discharge: 2017-09-09 | Disposition: A | Discharge status: Home / Self Care | Payer: Medicare Other | Source: Ambulatory Visit | Attending: Cardiovascular Disease | Admitting: Cardiovascular Disease

## 2017-09-09 ENCOUNTER — Encounter (HOSPITAL_COMMUNITY): Payer: Medicare Other

## 2017-09-09 DIAGNOSIS — Z955 Presence of coronary angioplasty implant and graft: Secondary | ICD-10-CM | POA: Diagnosis not present

## 2017-09-11 ENCOUNTER — Encounter (HOSPITAL_COMMUNITY)
Admission: RE | Admit: 2017-09-11 | Discharge: 2017-09-11 | Disposition: A | Discharge status: Home / Self Care | Payer: Medicare Other | Source: Ambulatory Visit | Attending: Cardiovascular Disease | Admitting: Cardiovascular Disease

## 2017-09-11 ENCOUNTER — Encounter (HOSPITAL_COMMUNITY): Payer: Medicare Other

## 2017-09-11 DIAGNOSIS — Z955 Presence of coronary angioplasty implant and graft: Secondary | ICD-10-CM

## 2017-09-16 ENCOUNTER — Encounter (HOSPITAL_COMMUNITY): Payer: Medicare Other

## 2017-09-16 ENCOUNTER — Encounter (HOSPITAL_COMMUNITY)
Admission: RE | Admit: 2017-09-16 | Discharge: 2017-09-16 | Disposition: A | Discharge status: Home / Self Care | Payer: Medicare Other | Source: Ambulatory Visit | Attending: Cardiovascular Disease | Admitting: Cardiovascular Disease

## 2017-09-16 DIAGNOSIS — Z955 Presence of coronary angioplasty implant and graft: Secondary | ICD-10-CM

## 2017-09-18 ENCOUNTER — Encounter (HOSPITAL_COMMUNITY)
Admission: RE | Admit: 2017-09-18 | Discharge: 2017-09-18 | Disposition: A | Discharge status: Home / Self Care | Payer: Medicare Other | Source: Ambulatory Visit | Attending: Cardiovascular Disease | Admitting: Cardiovascular Disease

## 2017-09-18 ENCOUNTER — Encounter (HOSPITAL_COMMUNITY): Payer: Medicare Other

## 2017-09-18 DIAGNOSIS — Z955 Presence of coronary angioplasty implant and graft: Secondary | ICD-10-CM

## 2017-09-21 ENCOUNTER — Encounter (HOSPITAL_COMMUNITY): Payer: Medicare Other

## 2017-09-21 ENCOUNTER — Encounter (HOSPITAL_COMMUNITY)
Admission: RE | Admit: 2017-09-21 | Discharge: 2017-09-21 | Disposition: A | Discharge status: Home / Self Care | Payer: Medicare Other | Source: Ambulatory Visit | Attending: Cardiovascular Disease | Admitting: Cardiovascular Disease

## 2017-09-21 DIAGNOSIS — Z955 Presence of coronary angioplasty implant and graft: Secondary | ICD-10-CM | POA: Diagnosis present

## 2017-09-23 ENCOUNTER — Encounter (HOSPITAL_COMMUNITY)
Admission: RE | Admit: 2017-09-23 | Discharge: 2017-09-23 | Disposition: A | Discharge status: Home / Self Care | Payer: Medicare Other | Source: Ambulatory Visit | Attending: Cardiovascular Disease | Admitting: Cardiovascular Disease

## 2017-09-23 ENCOUNTER — Encounter (HOSPITAL_COMMUNITY): Payer: Medicare Other

## 2017-09-23 ENCOUNTER — Encounter (HOSPITAL_COMMUNITY): Payer: Self-pay

## 2017-09-23 DIAGNOSIS — Z955 Presence of coronary angioplasty implant and graft: Secondary | ICD-10-CM | POA: Diagnosis not present

## 2017-09-25 ENCOUNTER — Encounter (HOSPITAL_COMMUNITY): Payer: Medicare Other

## 2017-09-28 ENCOUNTER — Encounter (HOSPITAL_COMMUNITY): Payer: Medicare Other

## 2017-09-28 ENCOUNTER — Encounter (HOSPITAL_COMMUNITY)
Admission: RE | Admit: 2017-09-28 | Discharge: 2017-09-28 | Disposition: A | Discharge status: Home / Self Care | Payer: Medicare Other | Source: Ambulatory Visit | Attending: Cardiovascular Disease | Admitting: Cardiovascular Disease

## 2017-09-28 DIAGNOSIS — Z955 Presence of coronary angioplasty implant and graft: Secondary | ICD-10-CM | POA: Diagnosis not present

## 2017-09-30 ENCOUNTER — Encounter (HOSPITAL_COMMUNITY): Payer: Medicare Other

## 2017-09-30 ENCOUNTER — Encounter (HOSPITAL_COMMUNITY)
Admission: RE | Admit: 2017-09-30 | Discharge: 2017-09-30 | Disposition: A | Discharge status: Home / Self Care | Payer: Medicare Other | Source: Ambulatory Visit | Attending: Cardiovascular Disease | Admitting: Cardiovascular Disease

## 2017-09-30 DIAGNOSIS — Z955 Presence of coronary angioplasty implant and graft: Secondary | ICD-10-CM | POA: Diagnosis not present

## 2017-10-01 NOTE — Progress Notes (Signed)
Cardiac Individual Treatment Plan  Patient Details  Name: Jose Warner MRN: [id number] Date of Birth: [birth year] Referring Provider:   Flowsheet Row CARDIAC REHAB PHASE II ORIENTATION from 07/30/2017 in [place]  Referring Provider  Shelva Majestic, MD      Initial Encounter Date:  [place] PHASE II ORIENTATION from 07/30/2017 in [place]  Date  07/30/17  Referring Provider  Shelva Majestic, MD      Visit Diagnosis: Status post coronary artery stent placement  Patient's Home Medications on Admission:  Current Outpatient Medications:  .  amLODipine (NORVASC) 2.5 MG tablet, Take 1 tablet (2.5 mg total) by mouth daily., Disp: 90 tablet, Rfl: 3 .  bisoprolol (ZEBETA) 5 MG tablet, Take 0.5 tablets (2.5 mg total) by mouth daily., Disp: 45 tablet, Rfl: 3 .  budesonide-formoterol (SYMBICORT) 160-4.5 MCG/ACT inhaler, Inhale 2 puffs into the lungs 2 (two) times daily as needed (for cough). , Disp: , Rfl:  .  clopidogrel (PLAVIX) 75 MG tablet, Take 1 tablet (75 mg total) by mouth daily., Disp: 90 tablet, Rfl: 3 .  guaiFENesin (MUCINEX) 600 MG 12 hr tablet, Take 600 mg by mouth 2 (two) times daily as needed., Disp: , Rfl:  .  Multiple Vitamins-Minerals (MULTIVITAMIN PO), Take 1 tablet by mouth at bedtime. , Disp: , Rfl:  .  niacin (NIASPAN) 1000 MG CR tablet, Take 1 tablet by mouth at bedtime. , Disp: , Rfl:  .  nitroGLYCERIN (NITROSTAT) 0.4 MG SL tablet, Place 1 tablet (0.4 mg total) under the tongue every 5 (five) minutes x 3 doses as needed for chest pain., Disp: 30 tablet, Rfl: 0 .  pantoprazole (PROTONIX) 40 MG tablet, Take 80 mg by mouth daily. , Disp: , Rfl:  .  pravastatin (PRAVACHOL) 80 MG tablet, Take 80 mg by mouth daily., Disp: , Rfl:  .  PROAIR HFA 108 (90 BASE) MCG/ACT inhaler, Take 2 puffs by mouth 2 (two) times daily as needed. For shortness of breath., Disp: , Rfl:  .  rivaroxaban (XARELTO) 20  MG TABS tablet, Take 1 tablet (20 mg total) by mouth daily with supper., Disp: 90 tablet, Rfl: 3 .  rivaroxaban (XARELTO) 20 MG TABS tablet, Take 1 tablet (20 mg total) by mouth daily., Disp: 90 tablet, Rfl: 1 .  Vitamin D, Ergocalciferol, 2000 units CAPS, Take 1 capsule by mouth at bedtime. , Disp: , Rfl:   Past Medical History: Past Medical History:  Diagnosis Date  . Anemia   . Atrial fibrillation ([place]) 06/05/2017  . Bronchitis 05-10-12   past fall- 4 runs antibiotics due to bronchitis-uses [employer] as needed  . Celiac disease   . Colon polyps    adenomatous  . Coronary artery disease   . Diverticulosis   . GERD (gastroesophageal reflux disease)   . Hepatitis 05-10-12   "was told non A, non B"-unclean dental equip.  [place] Hyperlipidemia   . Iron deficiency anemia   . Prostate cancer ([place]) 05-10-12   ;bx. 04-12-12, dx  . Spasm of esophagus 2013    Tobacco Use: Social History   Tobacco Use  Smoking Status Never Smoker  Smokeless Tobacco Never Used    Labs: Recent Review Flowsheet Data    Labs for ITP Cardiac and Pulmonary Rehab Latest Ref Rng & Units 06/10/2017   Cholestrol 0 - 200 mg/dL 122   LDLCALC 0 - 99 mg/dL 60   HDL >40 mg/dL 41   Trlycerides <150  mg/dL 103   Hemoglobin A1c 4.8 - 5.6 % 6.1(H)      Capillary Blood Glucose: No results found for: GLUCAP   Exercise Target Goals:    Exercise Program Goal: Individual exercise prescription set using results from initial 6 min walk test and THRR while considering  patient's activity barriers and safety.   Exercise Prescription Goal: Initial exercise prescription builds to 30-45 minutes a day of aerobic activity, 2-3 days per week.  Home exercise guidelines will be given to patient during program as part of exercise prescription that the participant will acknowledge.  Activity Barriers & Risk Stratification: Activity Barriers & Cardiac Risk Stratification - 07/30/17 0957    Activity Barriers & Cardiac Risk  Stratification          Activity Barriers  Other (comment)    Comments  R knee discomfort    Cardiac Risk Stratification  Moderate           6 Minute Walk: 6 Minute Walk    6 Minute Walk    Row Name 07/30/17 0954   Phase  Initial   Distance  2160 feet   Walk Time  6 minutes   # of Rest Breaks  0   MPH  4.1   METS  4.8   RPE  12   VO2 Peak  16.8   Symptoms  No   Resting HR  67 bpm   Resting BP  110/60   Resting Oxygen Saturation   99 %   Exercise Oxygen Saturation  during 6 min walk  99 %   Max Ex. HR  105 bpm   Max Ex. BP  132/60   2 Minute Post BP  118/72          Oxygen Initial Assessment:   Oxygen Re-Evaluation:   Oxygen Discharge (Final Oxygen Re-Evaluation):   Initial Exercise Prescription: Initial Exercise Prescription - 07/30/17 1000    Date of Initial Exercise RX and Referring Provider          Date  07/30/17    Referring Provider  Shelva Majestic, MD        Treadmill          MPH  3    Grade  2    Minutes  10    METs  4.12        Bike          Level  1.2    Minutes  10    METs  3.83        NuStep          Level  4    SPM  80    Minutes  10    METs  3.5        Prescription Details          Frequency (times per week)  3    Duration  Progress to 30 minutes of continuous aerobic without signs/symptoms of physical distress        Intensity          THRR 40-80% of Max Heartrate  62-124    Ratings of Perceived Exertion  11-13    Perceived Dyspnea  0-4        Progression          Progression  Continue to progress workloads to maintain intensity without signs/symptoms of physical distress.        Art gallery manager Prescription  Yes    Weight  5lbs    Reps  10-15           Perform Capillary Blood Glucose checks as needed.  Exercise Prescription Changes: Exercise Prescription Changes    Response to Exercise    Row Name 08/03/17 1100 08/24/17 1030 09/02/17 1000 09/23/17 1200   Blood Pressure  (Admit)  112/62  122/64  110/62  112/70   Blood Pressure (Exercise)  168/86  138/60  138/62  128/70   Blood Pressure (Exit)  100/66  100/70  108/66  104/62   Heart Rate (Admit)  62 bpm  63 bpm  59 bpm  60 bpm   Heart Rate (Exercise)  113 bpm  118 bpm  115 bpm  133 bpm   Heart Rate (Exit)  68 bpm  63 bpm  67 bpm  64 bpm   Rating of Perceived Exertion (Exercise)  14  13  12  13    Symptoms  none  none  none  none   Comments  pt oriented to exercise equipment today  no documentation  no documentation  no documentation   Duration  Progress to 30 minutes of  aerobic without signs/symptoms of physical distress  Progress to 30 minutes of  aerobic without signs/symptoms of physical distress  Progress to 30 minutes of  aerobic without signs/symptoms of physical distress  Progress to 30 minutes of  aerobic without signs/symptoms of physical distress   Intensity  THRR unchanged  THRR unchanged  THRR unchanged  THRR unchanged       Progression    Row Name 08/03/17 1100 08/24/17 1030 09/02/17 1000 09/23/17 1200   Progression  Continue to progress workloads to maintain intensity without signs/symptoms of physical distress.  Continue to progress workloads to maintain intensity without signs/symptoms of physical distress.  Continue to progress workloads to maintain intensity without signs/symptoms of physical distress.  Continue to progress workloads to maintain intensity without signs/symptoms of physical distress.   Average METs  4.3  4.7  4.9  5.7       Resistance Training    Row Name 08/03/17 1100 08/24/17 1030 09/02/17 1000 09/23/17 1200   Training Prescription  Yes  Yes  No relaxation day  No relaxation day   Weight  5lbs  5lbs  no documentation  no documentation   Reps  10-15  10-15  no documentation  no documentation   Time  10 Minutes  10 Minutes  10 Minutes  10 Minutes       Treadmill    Row Name 08/03/17 1100 08/24/17 1030 09/02/17 1000 09/23/17 1200   MPH  3  3  3  3    Grade  2  2  2  2     Minutes  10  10  10  10    METs  4.12  4.12  4.12  4.12       Bike    Row Name 08/03/17 1100 08/24/17 1030 09/02/17 1000 09/23/17 1200   Level  1.2  1.2  1.2  2   Minutes  10  10  10  10    METs  3.87  3.87  3.82  5.7       NuStep    Row Name 08/03/17 1100 08/24/17 1030 09/02/17 1000 09/23/17 1200   Level  4  5  5  6    SPM  80  110  130  130   Minutes  10  10  10   10  METs  4.8  6.1  6.9  7.3       Home Exercise Plan    Row Name 08/03/17 1100 08/24/17 1030 09/02/17 1000 09/23/17 1200   Plans to continue exercise at  no documentation  no documentation  Home (comment)  Home (comment)   Frequency  no documentation  no documentation  Add 2 additional days to program exercise sessions.  Add 2 additional days to program exercise sessions.   Initial Home Exercises Provided  no documentation  no documentation  08/26/17  08/26/17          Exercise Comments: Exercise Comments    Row Name 08/03/17 1137 08/26/17 1112 09/01/17 1503 09/29/17 1420   Exercise Comments  Pt completed first session of cardiac rehab. Pt denied symptoms of CP, dizziness or SOB. Pt was able to exercise safely in group environment.   Reviewed METs, goals and HEP. Pt is progressing well in cardiac rehab. Rehab staff will continue to monitor activity levels and progress as tolerated.  Reviewed METs, goals and HEP. Pt is progressing well in cardiac rehab. Rehab staff will continue to monitor activity levels and progress as tolerated.  Reviewed METs, goals and HEP. Pt is progressing well in cardiac rehab. Rehab staff will continue to monitor activity levels and progress as tolerated.      Exercise Goals and Review: Exercise Goals    Exercise Goals    Row Name 07/30/17 0857   Increase Physical Activity  Yes   Intervention  Provide advice, education, support and counseling about physical activity/exercise needs.;Develop an individualized exercise prescription for aerobic and resistive training based on initial evaluation  findings, risk stratification, comorbidities and participant's personal goals.   Expected Outcomes  Short Term: Attend rehab on a regular basis to increase amount of physical activity.;Long Term: Exercising regularly at least 3-5 days a week.   Increase Strength and Stamina  Yes   Intervention  Provide advice, education, support and counseling about physical activity/exercise needs.;Develop an individualized exercise prescription for aerobic and resistive training based on initial evaluation findings, risk stratification, comorbidities and participant's personal goals.   Expected Outcomes  Short Term: Increase workloads from initial exercise prescription for resistance, speed, and METs.;Short Term: Perform resistance training exercises routinely during rehab and add in resistance training at home;Long Term: Improve cardiorespiratory fitness, muscular endurance and strength as measured by increased METs and functional capacity (6MWT)   Able to understand and use rate of perceived exertion (RPE) scale  Yes   Intervention  Provide education and explanation on how to use RPE scale   Expected Outcomes  Short Term: Able to use RPE daily in rehab to express subjective intensity level;Long Term:  Able to use RPE to guide intensity level when exercising independently   Knowledge and understanding of Target Heart Rate Range (THRR)  Yes   Intervention  Provide education and explanation of THRR including how the numbers were predicted and where they are located for reference   Expected Outcomes  Short Term: Able to state/look up THRR;Long Term: Able to use THRR to govern intensity when exercising independently;Short Term: Able to use daily as guideline for intensity in rehab   Able to check pulse independently  Yes   Intervention  Provide education and demonstration on how to check pulse in carotid and radial arteries.;Review the importance of being able to check your own pulse for safety during independent exercise    Expected Outcomes  Short Term: Able to explain why pulse checking is important  during independent exercise;Long Term: Able to check pulse independently and accurately   Understanding of Exercise Prescription  Yes   Intervention  Provide education, explanation, and written materials on patient's individual exercise prescription   Expected Outcomes  Short Term: Able to explain program exercise prescription;Long Term: Able to explain home exercise prescription to exercise independently          Exercise Goals Re-Evaluation : Exercise Goals Re-Evaluation    Exercise Goal Re-Evaluation    Row Name 08/04/17 1346 08/26/17 1113 09/01/17 1503 09/29/17 1420   Exercise Goals Review  Increase Physical Activity;Able to understand and use rate of perceived exertion (RPE) scale;Understanding of Exercise Prescription  Increase Physical Activity;Able to understand and use rate of perceived exertion (RPE) scale;Knowledge and understanding of Target Heart Rate Range (THRR);Understanding of Exercise Prescription;Increase Strength and Stamina;Able to check pulse independently  Increase Physical Activity;Able to understand and use rate of perceived exertion (RPE) scale;Knowledge and understanding of Target Heart Rate Range (THRR);Understanding of Exercise Prescription;Increase Strength and Stamina;Able to check pulse independently  no documentation   Comments  Pt understands exercise flow and program. Pt is also familiar with RPE scale and uses it correctly.  Pt has progressed WL's and MET levels. Pt is tolerating exercise program very well and has noticed an improvement in strength and stamina.  Pt has progressed WL's and MET levels. Pt is tolerating exercise program very well and has noticed an improvement in strength and stamina.  Pt has improved walking tolerance in which he averages 11,000 steps per day, Pt has also increased intensity and WL on exercise equipment. Pt is working towards personal goal of toning  mid-section. Overall pt is making great progress and feels as though he is headed in the right direction.   Expected Outcomes  Pt will be able to improve in cardiorespiratory fitness and functional capacity.  Pt will be able to improve in cardiorespiratory fitness and functional capacity.  Pt will be able to improve in cardiorespiratory fitness and functional capacity.  Pt will be able to improve in cardiorespiratory fitness and functional capacity.           Discharge Exercise Prescription (Final Exercise Prescription Changes): Exercise Prescription Changes - 09/23/17 1200    Response to Exercise          Blood Pressure (Admit)  112/70    Blood Pressure (Exercise)  128/70    Blood Pressure (Exit)  104/62    Heart Rate (Admit)  60 bpm    Heart Rate (Exercise)  133 bpm    Heart Rate (Exit)  64 bpm    Rating of Perceived Exertion (Exercise)  13    Symptoms  none    Duration  Progress to 30 minutes of  aerobic without signs/symptoms of physical distress    Intensity  THRR unchanged        Progression          Progression  Continue to progress workloads to maintain intensity without signs/symptoms of physical distress.    Average METs  5.7        Resistance Training          Training Prescription  No relaxation day    Time  10 Minutes        Treadmill          MPH  3    Grade  2    Minutes  10    METs  4.12        Bike  Level  2    Minutes  10    METs  5.7        NuStep          Level  6    SPM  130    Minutes  10    METs  7.3        Home Exercise Plan          Plans to continue exercise at  Home (comment)    Frequency  Add 2 additional days to program exercise sessions.    Initial Home Exercises Provided  08/26/17           Nutrition:  Target Goals: Understanding of nutrition guidelines, daily intake of sodium <1528m, cholesterol <208m calories 30% from fat and 7% or less from saturated fats, daily to have 5 or more servings of fruits and  vegetables.  Biometrics: Pre Biometrics - 07/30/17 1018    Pre Biometrics          Height  5' 8"  (1.727 m)    Weight  174 lb 6.1 oz (79.1 kg)    Waist Circumference  38 inches    Hip Circumference  39.75 inches    Waist to Hip Ratio  0.96 %    BMI (Calculated)  26.52    Triceps Skinfold  21 mm    % Body Fat  27.5 %    Grip Strength  42 kg    Flexibility  13 in    Single Leg Stand  30 seconds            Nutrition Therapy Plan and Nutrition Goals: Nutrition Therapy & Goals - 07/30/17 1014    Nutrition Therapy          Diet  Heart Healthy, Gluten Free        Personal Nutrition Goals          Nutrition Goal  Pt to identify food quantities necessary to achieve weight loss of 6-10 lb at graduation from cardiac rehab. Goal wt of 160 lb desired.    Personal Goal #2  Pt able to name foods that affect blood glucose.    Personal Goal #3  Pt to describe the benefit of including fruits, vegetables, whole grains, and low-fat dairy products in a heart healthy meal plan.        Intervention Plan          Intervention  Prescribe, educate and counsel regarding individualized specific dietary modifications aiming towards targeted core components such as weight, hypertension, lipid management, diabetes, heart failure and other comorbidities.    Expected Outcomes  Short Term Goal: Understand basic principles of dietary content, such as calories, fat, sodium, cholesterol and nutrients.;Long Term Goal: Adherence to prescribed nutrition plan.           Nutrition Assessments: Nutrition Assessments - 07/30/17 1015    MEDFICTS Scores          Pre Score  61           Nutrition Goals Re-Evaluation:   Nutrition Goals Re-Evaluation:   Nutrition Goals Discharge (Final Nutrition Goals Re-Evaluation):   Psychosocial: Target Goals: Acknowledge presence or absence of significant depression and/or stress, maximize coping skills, provide positive support system. Participant is able to  verbalize types and ability to use techniques and skills needed for reducing stress and depression.  Initial Review & Psychosocial Screening: Initial Psych Review & Screening - 07/30/17 1102    Initial Review  Current issues with  None Identified        Family Dynamics          Good Support System?  Yes Pt states that he has the support of his spouse, family, and friends.         Barriers          Psychosocial barriers to participate in program  There are no identifiable barriers or psychosocial needs.        Screening Interventions          Interventions  Encouraged to exercise    Expected Outcomes  Short Term goal: Identification and review with participant of any Quality of Life or Depression concerns found by scoring the questionnaire.;Long Term goal: The participant improves quality of Life and PHQ9 Scores as seen by post scores and/or verbalization of changes           Quality of Life Scores: Quality of Life - 07/30/17 1042    Quality of Life Scores          Health/Function Pre  25.4 %    Socioeconomic Pre  27.19 %    Psych/Spiritual Pre  25.21 %    Family Pre  29.5 %    GLOBAL Pre  26.36 %          Scores of 19 and below usually indicate a poorer quality of life in these areas.  A difference of  2-3 points is a clinically meaningful difference.  A difference of 2-3 points in the total score of the Quality of Life Index has been associated with significant improvement in overall quality of life, self-image, physical symptoms, and general health in studies assessing change in quality of life.  PHQ-9: Recent Review Flowsheet Data    Depression screen [facility] 2/9 08/03/2017   Decreased Interest 0   Down, Depressed, Hopeless 0   PHQ - 2 Score 0     Interpretation of Total Score  Total Score Depression Severity:  1-4 = Minimal depression, 5-9 = Mild depression, 10-14 = Moderate depression, 15-19 = Moderately severe depression, 20-27 = Severe depression    Psychosocial Evaluation and Intervention: Psychosocial Evaluation - 08/03/17 1004    Psychosocial Evaluation & Interventions          Interventions  Encouraged to exercise with the program and follow exercise prescription;Relaxation education;Stress management education    Comments  Pt denies any psychosocial needs.  Will periodically check in with pt for continued mental well being.    Expected Outcomes  Pt will demonstrate approriate coping skills and good overall outlook.      Continue Psychosocial Services   Follow up required by staff           Psychosocial Re-Evaluation: Psychosocial Re-Evaluation    Psychosocial Re-Evaluation    [place] Name 08/03/17 1005 08/31/17 1710 09/23/17 1525   Current issues with  None Identified  None Identified  None Identified   Comments  no documentation  no psychosocial needs identifed, no interventions necessary  no psychosocial needs identifed, no interventions necessary   Expected Outcomes  no documentation  pt will exhibit positive outlook with good coping skills.   pt will exhibit positive outlook with good coping skills.    Interventions  Relaxation education;Encouraged to attend Cardiac Rehabilitation for the exercise;Stress management education  Relaxation education;Encouraged to attend Cardiac Rehabilitation for the exercise;Stress management education  Relaxation education;Encouraged to attend Cardiac Rehabilitation for the exercise;Stress management education   Continue Psychosocial Services   Follow  up required by staff  no documentation  Follow up required by staff          Psychosocial Discharge (Final Psychosocial Re-Evaluation): Psychosocial Re-Evaluation - 09/23/17 1525    Psychosocial Re-Evaluation          Current issues with  None Identified    Comments  no psychosocial needs identifed, no interventions necessary    Expected Outcomes  pt will exhibit positive outlook with good coping skills.     Interventions  Relaxation  education;Encouraged to attend Cardiac Rehabilitation for the exercise;Stress management education    Continue Psychosocial Services   Follow up required by staff           Vocational Rehabilitation: Provide vocational rehab assistance to qualifying candidates.   Vocational Rehab Evaluation & Intervention: Vocational Rehab - 07/30/17 1059    Initial Vocational Rehab Evaluation & Intervention          Assessment shows need for Vocational Rehabilitation  No           Education: Education Goals: Education classes will be provided on a weekly basis, covering required topics. Participant will state understanding/return demonstration of topics presented.  Learning Barriers/Preferences: Learning Barriers/Preferences - 07/30/17 0842    Learning Barriers/Preferences          Learning Barriers  Sight    Learning Preferences  Skilled Demonstration;Written Material;Computer/Internet           Education Topics: Count Your Pulse:  -Group instruction provided by verbal instruction, demonstration, patient participation and written materials to support subject.  Instructors address importance of being able to find your pulse and how to count your pulse when at home without a heart monitor.  Patients get hands on experience counting their pulse with staff help and individually. Flowsheet Row CARDIAC REHAB PHASE II EXERCISE from 09/30/2017 in [place]  Date  09/04/17  Educator  RN  Instruction Review Code  2- Demonstrated Understanding      Heart Attack, Angina, and Risk Factor Modification:  -Group instruction provided by verbal instruction, video, and written materials to support subject.  Instructors address signs and symptoms of angina and heart attacks.    Also discuss risk factors for heart disease and how to make changes to improve heart health risk factors.   Functional Fitness:  -Group instruction provided by verbal instruction, demonstration,  patient participation, and written materials to support subject.  Instructors address safety measures for doing things around the house.  Discuss how to get up and down off the floor, how to pick things up properly, how to safely get out of a chair without assistance, and balance training. Flowsheet Row CARDIAC REHAB PHASE II EXERCISE from 09/30/2017 in [place]  Date  09/11/17  Educator  EP  Instruction Review Code  2- Demonstrated Understanding      Meditation and Mindfulness:  -Group instruction provided by verbal instruction, patient participation, and written materials to support subject.  Instructor addresses importance of mindfulness and meditation practice to help reduce stress and improve awareness.  Instructor also leads participants through a meditation exercise.  Flowsheet Row CARDIAC REHAB PHASE II EXERCISE from 09/30/2017 in [place]  Date  09/02/17  Instruction Review Code  2- Demonstrated Understanding      Stretching for Flexibility and Mobility:  -Group instruction provided by verbal instruction, patient participation, and written materials to support subject.  Instructors lead participants through series of stretches that are  designed to increase flexibility thus improving mobility.  These stretches are additional exercise for major muscle groups that are typically performed during regular warm up and cool down.   Hands Only CPR:  -Group verbal, video, and participation provides a basic overview of AHA guidelines for community CPR. Role-play of emergencies allow participants the opportunity to practice calling for help and chest compression technique with discussion of AED use.   Hypertension: -Group verbal and written instruction that provides a basic overview of hypertension including the most recent diagnostic guidelines, risk factor reduction with self-care instructions and medication  management.    Nutrition I class: Heart Healthy Eating:  -Group instruction provided by PowerPoint slides, verbal discussion, and written materials to support subject matter. The instructor gives an explanation and review of the Therapeutic Lifestyle Changes diet recommendations, which includes a discussion on lipid goals, dietary fat, sodium, fiber, plant stanol/sterol esters, sugar, and the components of a well-balanced, healthy diet.   Nutrition II class: Lifestyle Skills:  -Group instruction provided by PowerPoint slides, verbal discussion, and written materials to support subject matter. The instructor gives an explanation and review of label reading, grocery shopping for heart health, heart healthy recipe modifications, and ways to make healthier choices when eating out.   Diabetes Question & Answer:  -Group instruction provided by PowerPoint slides, verbal discussion, and written materials to support subject matter. The instructor gives an explanation and review of diabetes co-morbidities, pre- and post-prandial blood glucose goals, pre-exercise blood glucose goals, signs, symptoms, and treatment of hypoglycemia and hyperglycemia, and foot care basics. Flowsheet Row CARDIAC REHAB PHASE II EXERCISE from 09/30/2017 in [place]  Date  08/28/17  Educator  RD  Instruction Review Code  2- Demonstrated Understanding      Diabetes Blitz:  -Group instruction provided by PowerPoint slides, verbal discussion, and written materials to support subject matter. The instructor gives an explanation and review of the physiology behind type 1 and type 2 diabetes, diabetes medications and rational behind using different medications, pre- and post-prandial blood glucose recommendations and Hemoglobin A1c goals, diabetes diet, and exercise including blood glucose guidelines for exercising safely.    Portion Distortion:  -Group instruction provided by PowerPoint slides,  verbal discussion, written materials, and food models to support subject matter. The instructor gives an explanation of serving size versus portion size, changes in portions sizes over the last 20 years, and what consists of a serving from each food group. Flowsheet Row CARDIAC REHAB PHASE II EXERCISE from 09/30/2017 in [place]  Date  09/09/17  Educator  RD  Instruction Review Code  2- Demonstrated Understanding      Stress Management:  -Group instruction provided by verbal instruction, video, and written materials to support subject matter.  Instructors review role of stress in heart disease and how to cope with stress positively.   Flowsheet Row CARDIAC REHAB PHASE II EXERCISE from 09/30/2017 in [place]  Date  09/16/17  Educator  RN  Instruction Review Code  2- Demonstrated Understanding      Exercising on Your Own:  -Group instruction provided by verbal instruction, power point, and written materials to support subject.  Instructors discuss benefits of exercise, components of exercise, frequency and intensity of exercise, and end points for exercise.  Also discuss use of nitroglycerin and activating EMS.  Review options of places to exercise outside of rehab.  Review guidelines for sex with heart disease.   Cardiac  Drugs I:  -Group instruction provided by verbal instruction and written materials to support subject.  Instructor reviews cardiac drug classes: antiplatelets, anticoagulants, beta blockers, and statins.  Instructor discusses reasons, side effects, and lifestyle considerations for each drug class. Flowsheet Row CARDIAC REHAB PHASE II EXERCISE from 09/30/2017 in [place]  Date  08/26/17  Instruction Review Code  2- Demonstrated Understanding      Cardiac Drugs II:  -Group instruction provided by verbal instruction and written materials to support subject.  Instructor reviews  cardiac drug classes: angiotensin converting enzyme inhibitors (ACE-I), angiotensin II receptor blockers (ARBs), nitrates, and calcium channel blockers.  Instructor discusses reasons, side effects, and lifestyle considerations for each drug class. Flowsheet Row CARDIAC REHAB PHASE II EXERCISE from 09/30/2017 in [place]  Date  09/30/17  Instruction Review Code  2- Demonstrated Understanding      Anatomy and Physiology of the Circulatory System:  Group verbal and written instruction and models provide basic cardiac anatomy and physiology, with the coronary electrical and arterial systems. Review of: AMI, Angina, Valve disease, Heart Failure, Peripheral Artery Disease, Cardiac Arrhythmia, Pacemakers, and the ICD.   Other Education:  -Group or individual verbal, written, or video instructions that support the educational goals of the cardiac rehab program.   Holiday Eating Survival Tips:  -Group instruction provided by PowerPoint slides, verbal discussion, and written materials to support subject matter. The instructor gives patients tips, tricks, and techniques to help them not only survive but enjoy the holidays despite the onslaught of food that accompanies the holidays.   Knowledge Questionnaire Score: Knowledge Questionnaire Score - 07/30/17 1038    Knowledge Questionnaire Score          Pre Score  19/24           Core Components/Risk Factors/Patient Goals at Admission: Personal Goals and Risk Factors at Admission - 07/30/17 0858    Core Components/Risk Factors/Patient Goals on Admission           Weight Management  Yes;Weight Maintenance;Weight Loss    Intervention  Weight Management: Develop a combined nutrition and exercise program designed to reach desired caloric intake, while maintaining appropriate intake of nutrient and fiber, sodium and fats, and appropriate energy expenditure required for the weight goal.;Weight Management: Provide  education and appropriate resources to help participant work on and attain dietary goals.;Weight Management/Obesity: Establish reasonable short term and long term weight goals.    Admit Weight  174 lb 6.1 oz (79.1 kg)    Goal Weight: Short Term  164 lb (74.4 kg)    Goal Weight: Long Term  164 lb (74.4 kg)    Expected Outcomes  Short Term: Continue to assess and modify interventions until short term weight is achieved;Weight Maintenance: Understanding of the daily nutrition guidelines, which includes 25-35% calories from fat, 7% or less cal from saturated fats, less than 237m cholesterol, less than 1.5gm of sodium, & 5 or more servings of fruits and vegetables daily;Weight Loss: Understanding of general recommendations for a balanced deficit meal plan, which promotes 1-2 lb weight loss per week and includes a negative energy balance of [phone] kcal/d;Understanding recommendations for meals to include 15-35% energy as protein, 25-35% energy from fat, 35-60% energy from carbohydrates, less than 2071mof dietary cholesterol, 20-35 gm of total fiber daily;Understanding of distribution of calorie intake throughout the day with the consumption of 4-5 meals/snacks    Lipids  Yes    Intervention  Provide education  and support for participant on nutrition & aerobic/resistive exercise along with prescribed medications to achieve LDL <86m, HDL >426m    Expected Outcomes  Short Term: Participant states understanding of desired cholesterol values and is compliant with medications prescribed. Participant is following exercise prescription and nutrition guidelines.;Long Term: Cholesterol controlled with medications as prescribed, with individualized exercise RX and with personalized nutrition plan. Value goals: LDL < 7048mHDL > 40 mg.           Core Components/Risk Factors/Patient Goals Review:  Goals and Risk Factor Review    Core Components/Risk Factors/Patient Goals Review    Row Name 08/31/17 1708 09/23/17  1524   Personal Goals Review  Weight Management/Obesity;Lipids  Weight Management/Obesity;Lipids   Review  pt with CAD RF demonstrates eagerness to participate in CR program. pt encouraged to attend exercise, nutrition and lifestyle modificaiton education classes.    pt with CAD RF demonstrates eagerness to participate in CR program. pt notes decreased angina with amlodipine.  pt pleased with increased physical activity without symptoms.    Expected Outcomes  pt will participate in CR exercise nutrition and lifestyle modification classes.   pt will participate in CR exercise nutrition and lifestyle modification classes.           Core Components/Risk Factors/Patient Goals at Discharge (Final Review):  Goals and Risk Factor Review - 09/23/17 1524    Core Components/Risk Factors/Patient Goals Review          Personal Goals Review  Weight Management/Obesity;Lipids    Review  pt with CAD RF demonstrates eagerness to participate in CR program. pt notes decreased angina with amlodipine.  pt pleased with increased physical activity without symptoms.     Expected Outcomes  pt will participate in CR exercise nutrition and lifestyle modification classes.            ITP Comments: ITP Comments    Row Name 07/30/17 [id number]/19 1707 09/23/17 1524   ITP Comments  Dr. TraFransico Himedical Director  30 day ITP review. pt with good attendance and participation. pt demonstrates eagerness to participate in CR program.   30 day ITP review. pt with good attendance and participation. pt demonstrates eagerness to participate in CR program.       Comments:

## 2017-10-02 ENCOUNTER — Encounter (HOSPITAL_COMMUNITY)
Admission: RE | Admit: 2017-10-02 | Discharge: 2017-10-02 | Disposition: A | Discharge status: Home / Self Care | Payer: Medicare Other | Source: Ambulatory Visit | Attending: Cardiovascular Disease | Admitting: Cardiovascular Disease

## 2017-10-02 ENCOUNTER — Encounter (HOSPITAL_COMMUNITY): Payer: Medicare Other

## 2017-10-02 DIAGNOSIS — Z955 Presence of coronary angioplasty implant and graft: Secondary | ICD-10-CM | POA: Diagnosis not present

## 2017-10-05 ENCOUNTER — Encounter (HOSPITAL_COMMUNITY): Payer: Medicare Other

## 2017-10-05 ENCOUNTER — Encounter (HOSPITAL_COMMUNITY)
Admission: RE | Admit: 2017-10-05 | Discharge: 2017-10-05 | Disposition: A | Discharge status: Home / Self Care | Payer: Medicare Other | Source: Ambulatory Visit | Attending: Cardiovascular Disease | Admitting: Cardiovascular Disease

## 2017-10-05 DIAGNOSIS — Z955 Presence of coronary angioplasty implant and graft: Secondary | ICD-10-CM | POA: Diagnosis not present

## 2017-10-06 ENCOUNTER — Ambulatory Visit: Payer: Medicare Other | Admitting: Cardiovascular Disease

## 2017-10-07 ENCOUNTER — Encounter (HOSPITAL_COMMUNITY): Payer: Medicare Other

## 2017-10-07 ENCOUNTER — Encounter (HOSPITAL_COMMUNITY)
Admission: RE | Admit: 2017-10-07 | Discharge: 2017-10-07 | Disposition: A | Discharge status: Home / Self Care | Payer: Medicare Other | Source: Ambulatory Visit | Attending: Cardiovascular Disease | Admitting: Cardiovascular Disease

## 2017-10-07 DIAGNOSIS — Z955 Presence of coronary angioplasty implant and graft: Secondary | ICD-10-CM

## 2017-10-09 ENCOUNTER — Encounter (HOSPITAL_COMMUNITY): Payer: Medicare Other

## 2017-10-09 ENCOUNTER — Encounter (HOSPITAL_COMMUNITY)
Admission: RE | Admit: 2017-10-09 | Discharge: 2017-10-09 | Disposition: A | Discharge status: Home / Self Care | Payer: Medicare Other | Source: Ambulatory Visit | Attending: Cardiovascular Disease | Admitting: Cardiovascular Disease

## 2017-10-09 DIAGNOSIS — Z955 Presence of coronary angioplasty implant and graft: Secondary | ICD-10-CM | POA: Diagnosis not present

## 2017-10-09 NOTE — Progress Notes (Signed)
Trinna Balloon Boyden 67 y.o. male Nutrition Note Spoke with pt. Nutrition Plan and Nutrition Survey goals reviewed with pt. Pt is following a Heart Healthy diet (TLC step 1). Pt wants to lose wt. Pt has been trying to lose wt by watching his daily caloric intake, increasing his physical activity, and decreasing the amounts of red meat eaten. Additional wt loss tips reviewed with patient.  Discussed eating carbohydrates consistently across the day. Discussed incorporating a serving of complex carbohydrates in place of more refined carbohydrates at meals and snacks. Pt expressed understanding of the information reviewed. Pt aware of nutrition education classes offered and plans on attending nutrition classes.  Lab Results  Component Value Date   HGBA1C 6.1 (H) 06/10/2017    Wt Readings from Last 3 Encounters:  08/31/17 179 lb 12.8 oz (81.6 kg)  08/18/17 171 lb (77.6 kg)  07/30/17 174 lb 6.1 oz (79.1 kg)    Nutrition Diagnosis ? Food-and nutrition-related knowledge deficit related to lack of exposure to information as related to diagnosis of: ? CVD ? Overweight related to excessive energy intake as evidenced by a BMI of 27.34  Nutrition Intervention ? Pt's individual nutrition plan reviewed with pt. ? Benefits of adopting Heart Healthy diet discussed when [employer] reviewed.    Goal(s) ? Pt to describe the potential benefits of adopting a Heart Healthy diet.  ? Pt to identify and limit food sources of saturated fat, trans fat, and sodium ? Pt to identify food quantities necessary to achieve weight loss of 6-24 lb at graduation from cardiac rehab.     Laurina Bustle, MS, RD, LDN 10/09/2017 9:39 AM

## 2017-10-12 ENCOUNTER — Encounter (HOSPITAL_COMMUNITY)
Admission: RE | Admit: 2017-10-12 | Discharge: 2017-10-12 | Disposition: A | Discharge status: Home / Self Care | Payer: Medicare Other | Source: Ambulatory Visit | Attending: Cardiovascular Disease | Admitting: Cardiovascular Disease

## 2017-10-12 ENCOUNTER — Encounter (HOSPITAL_COMMUNITY): Payer: Medicare Other

## 2017-10-12 DIAGNOSIS — Z955 Presence of coronary angioplasty implant and graft: Secondary | ICD-10-CM | POA: Diagnosis not present

## 2017-10-14 ENCOUNTER — Encounter (HOSPITAL_COMMUNITY): Payer: Medicare Other

## 2017-10-14 ENCOUNTER — Encounter (HOSPITAL_COMMUNITY)
Admission: RE | Admit: 2017-10-14 | Discharge: 2017-10-14 | Disposition: A | Discharge status: Home / Self Care | Payer: Medicare Other | Source: Ambulatory Visit | Attending: Cardiovascular Disease | Admitting: Cardiovascular Disease

## 2017-10-14 DIAGNOSIS — Z955 Presence of coronary angioplasty implant and graft: Secondary | ICD-10-CM

## 2017-10-16 ENCOUNTER — Encounter (HOSPITAL_COMMUNITY): Payer: Medicare Other

## 2017-10-16 ENCOUNTER — Encounter (HOSPITAL_COMMUNITY)
Admission: RE | Admit: 2017-10-16 | Discharge: 2017-10-16 | Disposition: A | Discharge status: Home / Self Care | Payer: Medicare Other | Source: Ambulatory Visit | Attending: Cardiovascular Disease | Admitting: Cardiovascular Disease

## 2017-10-16 DIAGNOSIS — Z955 Presence of coronary angioplasty implant and graft: Secondary | ICD-10-CM | POA: Diagnosis not present

## 2017-10-19 ENCOUNTER — Encounter (HOSPITAL_COMMUNITY): Payer: Medicare Other

## 2017-10-19 ENCOUNTER — Encounter (HOSPITAL_COMMUNITY)
Admission: RE | Admit: 2017-10-19 | Discharge: 2017-10-19 | Disposition: A | Discharge status: Home / Self Care | Payer: Medicare Other | Source: Ambulatory Visit | Attending: Cardiovascular Disease | Admitting: Cardiovascular Disease

## 2017-10-19 ENCOUNTER — Encounter (HOSPITAL_COMMUNITY): Payer: Self-pay

## 2017-10-19 DIAGNOSIS — Z955 Presence of coronary angioplasty implant and graft: Secondary | ICD-10-CM

## 2017-10-21 ENCOUNTER — Encounter (HOSPITAL_COMMUNITY)
Admission: RE | Admit: 2017-10-21 | Discharge: 2017-10-21 | Disposition: A | Discharge status: Home / Self Care | Payer: Medicare Other | Source: Ambulatory Visit | Attending: Cardiovascular Disease | Admitting: Cardiovascular Disease

## 2017-10-21 ENCOUNTER — Encounter (HOSPITAL_COMMUNITY): Payer: Medicare Other

## 2017-10-21 DIAGNOSIS — Z955 Presence of coronary angioplasty implant and graft: Secondary | ICD-10-CM | POA: Diagnosis not present

## 2017-10-23 ENCOUNTER — Encounter (HOSPITAL_COMMUNITY): Payer: Medicare Other

## 2017-10-23 ENCOUNTER — Encounter (HOSPITAL_COMMUNITY)
Admission: RE | Admit: 2017-10-23 | Discharge: 2017-10-23 | Disposition: A | Discharge status: Home / Self Care | Payer: Medicare Other | Source: Ambulatory Visit | Attending: Cardiovascular Disease | Admitting: Cardiovascular Disease

## 2017-10-23 DIAGNOSIS — Z955 Presence of coronary angioplasty implant and graft: Secondary | ICD-10-CM | POA: Diagnosis not present

## 2017-10-26 ENCOUNTER — Encounter (HOSPITAL_COMMUNITY)
Admission: RE | Admit: 2017-10-26 | Discharge: 2017-10-26 | Disposition: A | Discharge status: Home / Self Care | Payer: Medicare Other | Source: Ambulatory Visit | Attending: Cardiovascular Disease | Admitting: Cardiovascular Disease

## 2017-10-26 ENCOUNTER — Encounter (HOSPITAL_COMMUNITY): Payer: Medicare Other

## 2017-10-26 DIAGNOSIS — Z955 Presence of coronary angioplasty implant and graft: Secondary | ICD-10-CM

## 2017-10-28 ENCOUNTER — Encounter (HOSPITAL_COMMUNITY): Payer: Medicare Other

## 2017-10-28 ENCOUNTER — Encounter (HOSPITAL_COMMUNITY)
Admission: RE | Admit: 2017-10-28 | Discharge: 2017-10-28 | Disposition: A | Discharge status: Home / Self Care | Payer: Medicare Other | Source: Ambulatory Visit | Attending: Cardiovascular Disease | Admitting: Cardiovascular Disease

## 2017-10-28 VITALS — Ht 68.0 in | Wt 174.4 lb

## 2017-10-28 DIAGNOSIS — Z955 Presence of coronary angioplasty implant and graft: Secondary | ICD-10-CM | POA: Diagnosis not present

## 2017-10-29 ENCOUNTER — Encounter (HOSPITAL_COMMUNITY): Payer: Self-pay

## 2017-10-29 NOTE — Progress Notes (Signed)
Cardiac Individual Treatment Plan  Patient Details  Name: Jose Warner MRN: [id number] Date of Birth: Jan 07, 1951 Referring Provider:   Flowsheet Row CARDIAC REHAB PHASE II ORIENTATION from 07/30/2017 in [place]  Referring Provider  Shelva Majestic, MD      Initial Encounter Date:  [place] PHASE II ORIENTATION from 07/30/2017 in [place]  Date  07/30/17      Visit Diagnosis: Status post coronary artery stent placement  Patient's Home Medications on Admission:  Current Outpatient Medications:  .  amLODipine (NORVASC) 2.5 MG tablet, Take 1 tablet (2.5 mg total) by mouth daily., Disp: 90 tablet, Rfl: 3 .  bisoprolol (ZEBETA) 5 MG tablet, Take 0.5 tablets (2.5 mg total) by mouth daily., Disp: 45 tablet, Rfl: 3 .  budesonide-formoterol (SYMBICORT) 160-4.5 MCG/ACT inhaler, Inhale 2 puffs into the lungs 2 (two) times daily as needed (for cough). , Disp: , Rfl:  .  clopidogrel (PLAVIX) 75 MG tablet, Take 1 tablet (75 mg total) by mouth daily., Disp: 90 tablet, Rfl: 3 .  guaiFENesin (MUCINEX) 600 MG 12 hr tablet, Take 600 mg by mouth 2 (two) times daily as needed., Disp: , Rfl:  .  Multiple Vitamins-Minerals (MULTIVITAMIN PO), Take 1 tablet by mouth at bedtime. , Disp: , Rfl:  .  niacin (NIASPAN) 1000 MG CR tablet, Take 1 tablet by mouth at bedtime. , Disp: , Rfl:  .  nitroGLYCERIN (NITROSTAT) 0.4 MG SL tablet, Place 1 tablet (0.4 mg total) under the tongue every 5 (five) minutes x 3 doses as needed for chest pain., Disp: 30 tablet, Rfl: 0 .  pantoprazole (PROTONIX) 40 MG tablet, Take 80 mg by mouth daily. , Disp: , Rfl:  .  pravastatin (PRAVACHOL) 80 MG tablet, Take 80 mg by mouth daily., Disp: , Rfl:  .  PROAIR HFA 108 (90 BASE) MCG/ACT inhaler, Take 2 puffs by mouth 2 (two) times daily as needed. For shortness of breath., Disp: , Rfl:  .  rivaroxaban (XARELTO) 20 MG TABS tablet, Take 1 tablet (20 mg  total) by mouth daily with supper., Disp: 90 tablet, Rfl: 3 .  rivaroxaban (XARELTO) 20 MG TABS tablet, Take 1 tablet (20 mg total) by mouth daily., Disp: 90 tablet, Rfl: 1 .  Vitamin D, Ergocalciferol, 2000 units CAPS, Take 1 capsule by mouth at bedtime. , Disp: , Rfl:   Past Medical History: Past Medical History:  Diagnosis Date  . Anemia   . Atrial fibrillation ([place]) 06/05/2017  . Bronchitis 05-10-12   past fall- 4 runs antibiotics due to bronchitis-uses [employer] as needed  . Celiac disease   . Colon polyps    adenomatous  . Coronary artery disease   . Diverticulosis   . GERD (gastroesophageal reflux disease)   . Hepatitis 05-10-12   "was told non A, non B"-unclean dental equip.  [place] Hyperlipidemia   . Iron deficiency anemia   . Prostate cancer ([place]) 05-10-12   ;bx. 04-12-12, dx  . Spasm of esophagus 2013    Tobacco Use: Social History   Tobacco Use  Smoking Status Never Smoker  Smokeless Tobacco Never Used    Labs: Recent Review Flowsheet Data    Labs for ITP Cardiac and Pulmonary Rehab Latest Ref Rng & Units 06/10/2017   Cholestrol 0 - 200 mg/dL 122   LDLCALC 0 - 99 mg/dL 60   HDL >40 mg/dL 41   Trlycerides <150 mg/dL 103   Hemoglobin A1c 4.8 -  5.6 % 6.1(H)      Capillary Blood Glucose: No results found for: GLUCAP   Exercise Target Goals:    Exercise Program Goal: Individual exercise prescription set using results from initial 6 min walk test and THRR while considering  patient's activity barriers and safety.   Exercise Prescription Goal: Initial exercise prescription builds to 30-45 minutes a day of aerobic activity, 2-3 days per week.  Home exercise guidelines will be given to patient during program as part of exercise prescription that the participant will acknowledge.  Activity Barriers & Risk Stratification: Activity Barriers & Cardiac Risk Stratification - 07/30/17 0957    Activity Barriers & Cardiac Risk Stratification          Activity Barriers   Other (comment)    Comments  R knee discomfort    Cardiac Risk Stratification  Moderate           6 Minute Walk: 6 Minute Walk    6 Minute Walk    Row Name 07/30/17 0954 10/28/17 1112   Phase  Initial  Discharge   Distance  2160 feet  2621 feet   Distance % Change  no documentation  21.34 %   Distance Feet Change  no documentation  461 ft   Walk Time  6 minutes  6 minutes   # of Rest Breaks  0  0   MPH  4.1  5   METS  4.8  5.7   RPE  12  13   VO2 Peak  16.8  19.9   Symptoms  No  No   Resting HR  67 bpm  59 bpm   Resting BP  110/60  108/62   Resting Oxygen Saturation   99 %  no documentation   Exercise Oxygen Saturation  during 6 min walk  99 %  no documentation   Max Ex. HR  105 bpm  103 bpm   Max Ex. BP  132/60  146/72   2 Minute Post BP  118/72  105/60          Oxygen Initial Assessment:   Oxygen Re-Evaluation:   Oxygen Discharge (Final Oxygen Re-Evaluation):   Initial Exercise Prescription: Initial Exercise Prescription - 07/30/17 1000    Date of Initial Exercise RX and Referring Provider          Date  07/30/17    Referring Provider  Shelva Majestic, MD        Treadmill          MPH  3    Grade  2    Minutes  10    METs  4.12        Bike          Level  1.2    Minutes  10    METs  3.83        NuStep          Level  4    SPM  80    Minutes  10    METs  3.5        Prescription Details          Frequency (times per week)  3    Duration  Progress to 30 minutes of continuous aerobic without signs/symptoms of physical distress        Intensity          THRR 40-80% of Max Heartrate  62-124    Ratings of Perceived Exertion  11-13    Perceived Dyspnea  0-4        Progression          Progression  Continue to progress workloads to maintain intensity without signs/symptoms of physical distress.        Resistance Training          Training Prescription  Yes    Weight  5lbs    Reps  10-15           Perform Capillary Blood  Glucose checks as needed.  Exercise Prescription Changes: Exercise Prescription Changes    Response to Exercise    Row Name 08/03/17 1100 08/24/17 1030 09/02/17 1000 09/23/17 1200 10/05/17 1624   Blood Pressure (Admit)  112/62  122/64  110/62  112/70  120/80   Blood Pressure (Exercise)  168/86  138/60  138/62  128/70  136/82   Blood Pressure (Exit)  100/66  100/70  108/66  104/62  104/60   Heart Rate (Admit)  62 bpm  63 bpm  59 bpm  60 bpm  57 bpm   Heart Rate (Exercise)  113 bpm  118 bpm  115 bpm  133 bpm  130 bpm   Heart Rate (Exit)  68 bpm  63 bpm  67 bpm  64 bpm  51 bpm   Rating of Perceived Exertion (Exercise)  14  13  12  13  12    Symptoms  none  none  none  none  none   Comments  pt oriented to exercise equipment today  no documentation  no documentation  no documentation  no documentation   Duration  Progress to 30 minutes of  aerobic without signs/symptoms of physical distress  Progress to 30 minutes of  aerobic without signs/symptoms of physical distress  Progress to 30 minutes of  aerobic without signs/symptoms of physical distress  Progress to 30 minutes of  aerobic without signs/symptoms of physical distress  Progress to 30 minutes of  aerobic without signs/symptoms of physical distress   Intensity  THRR unchanged  THRR unchanged  THRR unchanged  THRR unchanged  THRR unchanged       Progression    Row Name 08/03/17 1100 08/24/17 1030 09/02/17 1000 09/23/17 1200 10/05/17 1624   Progression  Continue to progress workloads to maintain intensity without signs/symptoms of physical distress.  Continue to progress workloads to maintain intensity without signs/symptoms of physical distress.  Continue to progress workloads to maintain intensity without signs/symptoms of physical distress.  Continue to progress workloads to maintain intensity without signs/symptoms of physical distress.  Continue to progress workloads to maintain intensity without signs/symptoms of physical distress.    Average METs  4.3  4.7  4.9  5.7  6.7       Resistance Training    Row Name 08/03/17 1100 08/24/17 1030 09/02/17 1000 09/23/17 1200 10/05/17 1624   Training Prescription  Yes  Yes  No relaxation day  No relaxation day  Yes   Weight  5lbs  5lbs  no documentation  no documentation  5lbs   Reps  10-15  10-15  no documentation  no documentation  10-15   Time  10 Minutes  10 Minutes  10 Minutes  10 Minutes  10 Minutes       Treadmill    Row Name 08/03/17 1100 08/24/17 1030 09/02/17 1000 09/23/17 1200 10/05/17 1624   MPH  3  3  3  3  3    Grade  2  2  2  2  2    Minutes  10  10  10  10  10    METs  4.12  4.12  4.12  4.12  4.12       Bike    Row Name 08/03/17 1100 08/24/17 1030 09/02/17 1000 09/23/17 1200 10/05/17 1624   Level  1.2  1.2  1.2  2  no documentation   Minutes  10  10  10  10   no documentation   METs  3.87  3.87  3.82  5.7  no documentation       [place] Name 08/03/17 1100 08/24/17 1030 09/02/17 1000 09/23/17 1200 10/05/17 1624   Level  4  5  5  6  6    SPM  80  110  130  130  140   Minutes  10  10  10  10  10    METs  4.8  6.1  6.9  7.3  8.1       Rower    Row Name 08/03/17 1100 08/24/17 1030 09/02/17 1000 09/23/17 1200 10/05/17 1624   Level  no documentation  no documentation  no documentation  no documentation  3   Watts  no documentation  no documentation  no documentation  no documentation  101   Minutes  no documentation  no documentation  no documentation  no documentation  10   METs  no documentation  no documentation  no documentation  no documentation  [place] Name 08/03/17 1100 08/24/17 1030 09/02/17 1000 09/23/17 1200 10/05/17 1624   Plans to continue exercise at  no documentation  no documentation  Home (comment)  Home (comment)  Home (comment)   Frequency  no documentation  no documentation  Add 2 additional days to program exercise sessions.  Add 2 additional days to program exercise sessions.  Add 2 additional days to  program exercise sessions.   Initial Home Exercises Provided  no documentation  no documentation  08/26/17  08/26/17  08/26/17       Response to Exercise    Row Name 10/19/17 1441   Blood Pressure (Admit)  122/62   Blood Pressure (Exercise)  144/82   Blood Pressure (Exit)  112/62   Heart Rate (Admit)  50 bpm   Heart Rate (Exercise)  132 bpm   Heart Rate (Exit)  60 bpm   Rating of Perceived Exertion (Exercise)  13   Symptoms  none   Duration  Progress to 30 minutes of  aerobic without signs/symptoms of physical distress   Intensity  THRR unchanged       Progression    Row Name 10/19/17 1441   Progression  Continue to progress workloads to maintain intensity without signs/symptoms of physical distress.   Average METs  7.1       Resistance Training    Row Name 10/19/17 1441   Training Prescription  Yes   Weight  6lbs   Reps  10-15   Time  10 Minutes       Treadmill    Row Name 10/19/17 1441   MPH  3.4   Grade  4   Minutes  10   METs  5.48       NuStep    Row Name 10/19/17 1441   Level  6   SPM  135   Minutes  10   METs  7.9       Rower    Row  Name 10/19/17 1441   Level  4   Watts  105   Minutes  10   METs  8       Home Exercise Plan    Row Name 10/19/17 1441   Plans to continue exercise at  Home (comment)   Frequency  Add 2 additional days to program exercise sessions.   Initial Home Exercises Provided  08/26/17          Exercise Comments: Exercise Comments    Row Name 08/03/17 1137 08/26/17 1112 09/01/17 1503 09/29/17 1420 10/28/17 1116   Exercise Comments  Pt completed first session of cardiac rehab. Pt denied symptoms of CP, dizziness or SOB. Pt was able to exercise safely in group environment.   Reviewed METs, goals and HEP. Pt is progressing well in cardiac rehab. Rehab staff will continue to monitor activity levels and progress as tolerated.  Reviewed METs, goals and HEP. Pt is progressing well in cardiac rehab. Rehab staff will continue to  monitor activity levels and progress as tolerated.  Reviewed METs, goals and HEP. Pt is progressing well in cardiac rehab. Rehab staff will continue to monitor activity levels and progress as tolerated.  Reviewed METs, goals and HEP. Pt is progressing well in cardiac rehab. Rehab staff will continue to monitor activity levels and progress as tolerated.      Exercise Goals and Review: Exercise Goals    Exercise Goals    Row Name 07/30/17 0857   Increase Physical Activity  Yes   Intervention  Provide advice, education, support and counseling about physical activity/exercise needs.;Develop an individualized exercise prescription for aerobic and resistive training based on initial evaluation findings, risk stratification, comorbidities and participant's personal goals.   Expected Outcomes  Short Term: Attend rehab on a regular basis to increase amount of physical activity.;Long Term: Exercising regularly at least 3-5 days a week.   Increase Strength and Stamina  Yes   Intervention  Provide advice, education, support and counseling about physical activity/exercise needs.;Develop an individualized exercise prescription for aerobic and resistive training based on initial evaluation findings, risk stratification, comorbidities and participant's personal goals.   Expected Outcomes  Short Term: Increase workloads from initial exercise prescription for resistance, speed, and METs.;Short Term: Perform resistance training exercises routinely during rehab and add in resistance training at home;Long Term: Improve cardiorespiratory fitness, muscular endurance and strength as measured by increased METs and functional capacity (6MWT)   Able to understand and use rate of perceived exertion (RPE) scale  Yes   Intervention  Provide education and explanation on how to use RPE scale   Expected Outcomes  Short Term: Able to use RPE daily in rehab to express subjective intensity level;Long Term:  Able to use RPE to guide  intensity level when exercising independently   Knowledge and understanding of Target Heart Rate Range (THRR)  Yes   Intervention  Provide education and explanation of THRR including how the numbers were predicted and where they are located for reference   Expected Outcomes  Short Term: Able to state/look up THRR;Long Term: Able to use THRR to govern intensity when exercising independently;Short Term: Able to use daily as guideline for intensity in rehab   Able to check pulse independently  Yes   Intervention  Provide education and demonstration on how to check pulse in carotid and radial arteries.;Review the importance of being able to check your own pulse for safety during independent exercise   Expected Outcomes  Short Term: Able to explain why pulse  checking is important during independent exercise;Long Term: Able to check pulse independently and accurately   Understanding of Exercise Prescription  Yes   Intervention  Provide education, explanation, and written materials on patient's individual exercise prescription   Expected Outcomes  Short Term: Able to explain program exercise prescription;Long Term: Able to explain home exercise prescription to exercise independently          Exercise Goals Re-Evaluation : Exercise Goals Re-Evaluation    Exercise Goal Re-Evaluation    Row Name 08/04/17 1346 08/26/17 1113 09/01/17 1503 09/29/17 1420 10/28/17 1116   Exercise Goals Review  Increase Physical Activity;Able to understand and use rate of perceived exertion (RPE) scale;Understanding of Exercise Prescription  Increase Physical Activity;Able to understand and use rate of perceived exertion (RPE) scale;Knowledge and understanding of Target Heart Rate Range (THRR);Understanding of Exercise Prescription;Increase Strength and Stamina;Able to check pulse independently  Increase Physical Activity;Able to understand and use rate of perceived exertion (RPE) scale;Knowledge and understanding of Target Heart  Rate Range (THRR);Understanding of Exercise Prescription;Increase Strength and Stamina;Able to check pulse independently  no documentation  Increase Physical Activity;Able to understand and use rate of perceived exertion (RPE) scale;Knowledge and understanding of Target Heart Rate Range (THRR);Understanding of Exercise Prescription;Increase Strength and Stamina;Able to check pulse independently   Comments  Pt understands exercise flow and program. Pt is also familiar with RPE scale and uses it correctly.  Pt has progressed WL's and MET levels. Pt is tolerating exercise program very well and has noticed an improvement in strength and stamina.  Pt has progressed WL's and MET levels. Pt is tolerating exercise program very well and has noticed an improvement in strength and stamina.  Pt has improved walking tolerance in which he averages 11,000 steps per day, Pt has also increased intensity and WL on exercise equipment. Pt is working towards personal goal of toning mid-section. Overall pt is making great progress and feels as though he is headed in the right direction.  Pt is handling WL increases very well. Pt is currently averaging over 11-12,000 steps per day. Pt has also decreased his wast and hip circumference and overall body fat percentage.   Expected Outcomes  Pt will be able to improve in cardiorespiratory fitness and functional capacity.  Pt will be able to improve in cardiorespiratory fitness and functional capacity.  Pt will be able to improve in cardiorespiratory fitness and functional capacity.  Pt will be able to improve in cardiorespiratory fitness and functional capacity.  Pt will be able to improve in cardiorespiratory fitness, functional capacity and physical physique.            Discharge Exercise Prescription (Final Exercise Prescription Changes): Exercise Prescription Changes - 10/19/17 1441    Response to Exercise          Blood Pressure (Admit)  122/62    Blood Pressure (Exercise)   144/82    Blood Pressure (Exit)  112/62    Heart Rate (Admit)  50 bpm    Heart Rate (Exercise)  132 bpm    Heart Rate (Exit)  60 bpm    Rating of Perceived Exertion (Exercise)  13    Symptoms  none    Duration  Progress to 30 minutes of  aerobic without signs/symptoms of physical distress    Intensity  THRR unchanged        Progression          Progression  Continue to progress workloads to maintain intensity without signs/symptoms of physical distress.  Average METs  7.1        Resistance Training          Training Prescription  Yes    Weight  6lbs    Reps  10-15    Time  10 Minutes        Treadmill          MPH  3.4    Grade  4    Minutes  10    METs  5.48        NuStep          Level  6    SPM  135    Minutes  10    METs  7.9        Rower          Level  4    Watts  105    Minutes  10    METs  8        Home Exercise Plan          Plans to continue exercise at  Home (comment)    Frequency  Add 2 additional days to program exercise sessions.    Initial Home Exercises Provided  08/26/17           Nutrition:  Target Goals: Understanding of nutrition guidelines, daily intake of sodium <1559m, cholesterol <2061m calories 30% from fat and 7% or less from saturated fats, daily to have 5 or more servings of fruits and vegetables.  Biometrics: Pre Biometrics - 07/30/17 1018    Pre Biometrics          Height  5' 8"  (1.727 m)    Weight  174 lb 6.1 oz (79.1 kg)    Waist Circumference  38 inches    Hip Circumference  39.75 inches    Waist to Hip Ratio  0.96 %    BMI (Calculated)  26.52    Triceps Skinfold  21 mm    % Body Fat  27.5 %    Grip Strength  42 kg    Flexibility  13 in    Single Leg Stand  30 seconds          Post Biometrics - 10/28/17 1112     Post  Biometrics          Height  5' 8"  (1.727 m)    Weight  174 lb 6.1 oz (79.1 kg)    Waist Circumference  37 inches    Hip Circumference  38.75 inches    Waist to Hip Ratio  0.95 %     BMI (Calculated)  26.52    Triceps Skinfold  17 mm    % Body Fat  26.1 %    Grip Strength  42 kg    Flexibility  14 in    Single Leg Stand  30 seconds           Nutrition Therapy Plan and Nutrition Goals: Nutrition Therapy & Goals - 07/30/17 1014    Nutrition Therapy          Diet  Heart Healthy, Gluten Free        Personal Nutrition Goals          Nutrition Goal  Pt to identify food quantities necessary to achieve weight loss of 6-10 lb at graduation from cardiac rehab. Goal wt of 160 lb desired.    Personal Goal #2  Pt able to name foods that affect blood glucose.    Personal Goal #3  Pt to describe the benefit of including fruits, vegetables, whole grains, and low-fat dairy products in a heart healthy meal plan.        Intervention Plan          Intervention  Prescribe, educate and counsel regarding individualized specific dietary modifications aiming towards targeted core components such as weight, hypertension, lipid management, diabetes, heart failure and other comorbidities.    Expected Outcomes  Short Term Goal: Understand basic principles of dietary content, such as calories, fat, sodium, cholesterol and nutrients.;Long Term Goal: Adherence to prescribed nutrition plan.           Nutrition Assessments: Nutrition Assessments - 07/30/17 1015    MEDFICTS Scores          Pre Score  61           Nutrition Goals Re-Evaluation:   Nutrition Goals Re-Evaluation:   Nutrition Goals Discharge (Final Nutrition Goals Re-Evaluation):   Psychosocial: Target Goals: Acknowledge presence or absence of significant depression and/or stress, maximize coping skills, provide positive support system. Participant is able to verbalize types and ability to use techniques and skills needed for reducing stress and depression.  Initial Review & Psychosocial Screening: Initial Psych Review & Screening - 07/30/17 1102    Initial Review          Current issues with  None  Identified        Family Dynamics          Good Support System?  Yes Pt states that he has the support of his spouse, family, and friends.         Barriers          Psychosocial barriers to participate in program  There are no identifiable barriers or psychosocial needs.        Screening Interventions          Interventions  Encouraged to exercise    Expected Outcomes  Short Term goal: Identification and review with participant of any Quality of Life or Depression concerns found by scoring the questionnaire.;Long Term goal: The participant improves quality of Life and PHQ9 Scores as seen by post scores and/or verbalization of changes           Quality of Life Scores: Quality of Life - 07/30/17 1042    Quality of Life Scores          Health/Function Pre  25.4 %    Socioeconomic Pre  27.19 %    Psych/Spiritual Pre  25.21 %    Family Pre  29.5 %    GLOBAL Pre  26.36 %          Scores of 19 and below usually indicate a poorer quality of life in these areas.  A difference of  2-3 points is a clinically meaningful difference.  A difference of 2-3 points in the total score of the Quality of Life Index has been associated with significant improvement in overall quality of life, self-image, physical symptoms, and general health in studies assessing change in quality of life.  PHQ-9: Recent Review Flowsheet Data    Depression screen [facility] 2/9 08/03/2017   Decreased Interest 0   Down, Depressed, Hopeless 0   PHQ - 2 Score 0     Interpretation of Total Score  Total Score Depression Severity:  1-4 = Minimal depression, 5-9 = Mild depression, 10-14 = Moderate depression, 15-19 = Moderately severe depression, 20-27 = Severe depression   Psychosocial Evaluation and Intervention: Psychosocial Evaluation - 08/03/17  1004    Psychosocial Evaluation & Interventions          Interventions  Encouraged to exercise with the program and follow exercise prescription;Relaxation education;Stress  management education    Comments  Pt denies any psychosocial needs.  Will periodically check in with pt for continued mental well being.    Expected Outcomes  Pt will demonstrate approriate coping skills and good overall outlook.      Continue Psychosocial Services   Follow up required by staff           Psychosocial Re-Evaluation: Psychosocial Re-Evaluation    Psychosocial Re-Evaluation    [place] Name 08/03/17 1005 08/31/17 1710 09/23/17 1525 10/19/17 1532   Current issues with  None Identified  None Identified  None Identified  None Identified   Comments  no documentation  no psychosocial needs identifed, no interventions necessary  no psychosocial needs identifed, no interventions necessary  no psychosocial needs identifed, no interventions necessary   Expected Outcomes  no documentation  pt will exhibit positive outlook with good coping skills.   pt will exhibit positive outlook with good coping skills.   pt will exhibit positive outlook with good coping skills.    Interventions  Relaxation education;Encouraged to attend Cardiac Rehabilitation for the exercise;Stress management education  Relaxation education;Encouraged to attend Cardiac Rehabilitation for the exercise;Stress management education  Relaxation education;Encouraged to attend Cardiac Rehabilitation for the exercise;Stress management education  Relaxation education;Encouraged to attend Cardiac Rehabilitation for the exercise;Stress management education   Continue Psychosocial Services   Follow up required by staff  no documentation  Follow up required by staff  Follow up required by staff          Psychosocial Discharge (Final Psychosocial Re-Evaluation): Psychosocial Re-Evaluation - 10/19/17 1532    Psychosocial Re-Evaluation          Current issues with  None Identified    Comments  no psychosocial needs identifed, no interventions necessary    Expected Outcomes  pt will exhibit positive outlook with good coping skills.      Interventions  Relaxation education;Encouraged to attend Cardiac Rehabilitation for the exercise;Stress management education    Continue Psychosocial Services   Follow up required by staff           Vocational Rehabilitation: Provide vocational rehab assistance to qualifying candidates.   Vocational Rehab Evaluation & Intervention: Vocational Rehab - 07/30/17 1059    Initial Vocational Rehab Evaluation & Intervention          Assessment shows need for Vocational Rehabilitation  No           Education: Education Goals: Education classes will be provided on a weekly basis, covering required topics. Participant will state understanding/return demonstration of topics presented.  Learning Barriers/Preferences: Learning Barriers/Preferences - 07/30/17 0842    Learning Barriers/Preferences          Learning Barriers  Sight    Learning Preferences  Skilled Demonstration;Written Material;Computer/Internet           Education Topics: Count Your Pulse:  -Group instruction provided by verbal instruction, demonstration, patient participation and written materials to support subject.  Instructors address importance of being able to find your pulse and how to count your pulse when at home without a heart monitor.  Patients get hands on experience counting their pulse with staff help and individually. Flowsheet Row CARDIAC REHAB PHASE II EXERCISE from 10/28/2017 in [place]  Date  10/16/17  Educator  RN  Instruction  Review Code  2- Demonstrated Understanding      Heart Attack, Angina, and Risk Factor Modification:  -Group instruction provided by verbal instruction, video, and written materials to support subject.  Instructors address signs and symptoms of angina and heart attacks.    Also discuss risk factors for heart disease and how to make changes to improve heart health risk factors. Flowsheet Row CARDIAC REHAB PHASE II EXERCISE from 10/28/2017 in  [place]  Date  10/07/17  Educator  RN  Instruction Review Code  2- Demonstrated Understanding      Functional Fitness:  -Group instruction provided by verbal instruction, demonstration, patient participation, and written materials to support subject.  Instructors address safety measures for doing things around the house.  Discuss how to get up and down off the floor, how to pick things up properly, how to safely get out of a chair without assistance, and balance training. Flowsheet Row CARDIAC REHAB PHASE II EXERCISE from 10/28/2017 in [place]  Date  09/11/17  Educator  EP  Instruction Review Code  2- Demonstrated Understanding      Meditation and Mindfulness:  -Group instruction provided by verbal instruction, patient participation, and written materials to support subject.  Instructor addresses importance of mindfulness and meditation practice to help reduce stress and improve awareness.  Instructor also leads participants through a meditation exercise.  Flowsheet Row CARDIAC REHAB PHASE II EXERCISE from 10/28/2017 in [place]  Date  09/02/17  Instruction Review Code  2- Demonstrated Understanding      Stretching for Flexibility and Mobility:  -Group instruction provided by verbal instruction, patient participation, and written materials to support subject.  Instructors lead participants through series of stretches that are designed to increase flexibility thus improving mobility.  These stretches are additional exercise for major muscle groups that are typically performed during regular warm up and cool down.   Hands Only CPR:  -Group verbal, video, and participation provides a basic overview of AHA guidelines for community CPR. Role-play of emergencies allow participants the opportunity to practice calling for help and chest compression technique with discussion of AED  use.   Hypertension: -Group verbal and written instruction that provides a basic overview of hypertension including the most recent diagnostic guidelines, risk factor reduction with self-care instructions and medication management.    Nutrition I class: Heart Healthy Eating:  -Group instruction provided by PowerPoint slides, verbal discussion, and written materials to support subject matter. The instructor gives an explanation and review of the Therapeutic Lifestyle Changes diet recommendations, which includes a discussion on lipid goals, dietary fat, sodium, fiber, plant stanol/sterol esters, sugar, and the components of a well-balanced, healthy diet.   Nutrition II class: Lifestyle Skills:  -Group instruction provided by PowerPoint slides, verbal discussion, and written materials to support subject matter. The instructor gives an explanation and review of label reading, grocery shopping for heart health, heart healthy recipe modifications, and ways to make healthier choices when eating out.   Diabetes Question & Answer:  -Group instruction provided by PowerPoint slides, verbal discussion, and written materials to support subject matter. The instructor gives an explanation and review of diabetes co-morbidities, pre- and post-prandial blood glucose goals, pre-exercise blood glucose goals, signs, symptoms, and treatment of hypoglycemia and hyperglycemia, and foot care basics. Flowsheet Row CARDIAC REHAB PHASE II EXERCISE from 10/28/2017 in [place]  Date  08/28/17  Educator  RD  Instruction Review  Code  2- Demonstrated Understanding      Diabetes Blitz:  -Group instruction provided by PowerPoint slides, verbal discussion, and written materials to support subject matter. The instructor gives an explanation and review of the physiology behind type 1 and type 2 diabetes, diabetes medications and rational behind using different medications, pre- and post-prandial  blood glucose recommendations and Hemoglobin A1c goals, diabetes diet, and exercise including blood glucose guidelines for exercising safely.    Portion Distortion:  -Group instruction provided by PowerPoint slides, verbal discussion, written materials, and food models to support subject matter. The instructor gives an explanation of serving size versus portion size, changes in portions sizes over the last 20 years, and what consists of a serving from each food group. Flowsheet Row CARDIAC REHAB PHASE II EXERCISE from 10/28/2017 in [place]  Date  09/09/17  Educator  RD  Instruction Review Code  2- Demonstrated Understanding      Stress Management:  -Group instruction provided by verbal instruction, video, and written materials to support subject matter.  Instructors review role of stress in heart disease and how to cope with stress positively.   Flowsheet Row CARDIAC REHAB PHASE II EXERCISE from 10/28/2017 in [place]  Date  09/16/17  Educator  RN  Instruction Review Code  2- Demonstrated Understanding      Exercising on Your Own:  -Group instruction provided by verbal instruction, power point, and written materials to support subject.  Instructors discuss benefits of exercise, components of exercise, frequency and intensity of exercise, and end points for exercise.  Also discuss use of nitroglycerin and activating EMS.  Review options of places to exercise outside of rehab.  Review guidelines for sex with heart disease. Flowsheet Row CARDIAC REHAB PHASE II EXERCISE from 10/28/2017 in [place]  Date  10/14/17  Educator  EP  Instruction Review Code  2- Demonstrated Understanding      Cardiac Drugs I:  -Group instruction provided by verbal instruction and written materials to support subject.  Instructor reviews cardiac drug classes: antiplatelets, anticoagulants, beta blockers, and  statins.  Instructor discusses reasons, side effects, and lifestyle considerations for each drug class. Flowsheet Row CARDIAC REHAB PHASE II EXERCISE from 10/28/2017 in [place]  Date  10/28/17  Instruction Review Code  2- Demonstrated Understanding      Cardiac Drugs II:  -Group instruction provided by verbal instruction and written materials to support subject.  Instructor reviews cardiac drug classes: angiotensin converting enzyme inhibitors (ACE-I), angiotensin II receptor blockers (ARBs), nitrates, and calcium channel blockers.  Instructor discusses reasons, side effects, and lifestyle considerations for each drug class. Flowsheet Row CARDIAC REHAB PHASE II EXERCISE from 10/28/2017 in [place]  Date  09/30/17  Instruction Review Code  2- Demonstrated Understanding      Anatomy and Physiology of the Circulatory System:  Group verbal and written instruction and models provide basic cardiac anatomy and physiology, with the coronary electrical and arterial systems. Review of: AMI, Angina, Valve disease, Heart Failure, Peripheral Artery Disease, Cardiac Arrhythmia, Pacemakers, and the ICD.   Other Education:  -Group or individual verbal, written, or video instructions that support the educational goals of the cardiac rehab program.   Holiday Eating Survival Tips:  -Group instruction provided by PowerPoint slides, verbal discussion, and written materials to support subject matter. The instructor gives patients tips, tricks, and techniques to help them not only  survive but enjoy the holidays despite the onslaught of food that accompanies the holidays.   Knowledge Questionnaire Score: Knowledge Questionnaire Score - 07/30/17 1038    Knowledge Questionnaire Score          Pre Score  19/24           Core Components/Risk Factors/Patient Goals at Admission: Personal Goals and Risk Factors at Admission - 07/30/17 0858     Core Components/Risk Factors/Patient Goals on Admission           Weight Management  Yes;Weight Maintenance;Weight Loss    Intervention  Weight Management: Develop a combined nutrition and exercise program designed to reach desired caloric intake, while maintaining appropriate intake of nutrient and fiber, sodium and fats, and appropriate energy expenditure required for the weight goal.;Weight Management: Provide education and appropriate resources to help participant work on and attain dietary goals.;Weight Management/Obesity: Establish reasonable short term and long term weight goals.    Admit Weight  174 lb 6.1 oz (79.1 kg)    Goal Weight: Short Term  164 lb (74.4 kg)    Goal Weight: Long Term  164 lb (74.4 kg)    Expected Outcomes  Short Term: Continue to assess and modify interventions until short term weight is achieved;Weight Maintenance: Understanding of the daily nutrition guidelines, which includes 25-35% calories from fat, 7% or less cal from saturated fats, less than 243m cholesterol, less than 1.5gm of sodium, & 5 or more servings of fruits and vegetables daily;Weight Loss: Understanding of general recommendations for a balanced deficit meal plan, which promotes 1-2 lb weight loss per week and includes a negative energy balance of [phone] kcal/d;Understanding recommendations for meals to include 15-35% energy as protein, 25-35% energy from fat, 35-60% energy from carbohydrates, less than 2052mof dietary cholesterol, 20-35 gm of total fiber daily;Understanding of distribution of calorie intake throughout the day with the consumption of 4-5 meals/snacks    Lipids  Yes    Intervention  Provide education and support for participant on nutrition & aerobic/resistive exercise along with prescribed medications to achieve LDL <707mHDL >74m41m  Expected Outcomes  Short Term: Participant states understanding of desired cholesterol values and is compliant with medications prescribed. Participant is  following exercise prescription and nutrition guidelines.;Long Term: Cholesterol controlled with medications as prescribed, with individualized exercise RX and with personalized nutrition plan. Value goals: LDL < 70mg19mL > 40 mg.           Core Components/Risk Factors/Patient Goals Review:  Goals and Risk Factor Review    Core Components/Risk Factors/Patient Goals Review    Row Name 08/31/17 1708 09/23/17 1524 10/19/17 1531   Personal Goals Review  Weight Management/Obesity;Lipids  Weight Management/Obesity;Lipids  Weight Management/Obesity;Lipids   Review  pt with CAD RF demonstrates eagerness to participate in CR program. pt encouraged to attend exercise, nutrition and lifestyle modificaiton education classes.    pt with CAD RF demonstrates eagerness to participate in CR program. pt notes decreased angina with amlodipine.  pt pleased with increased physical activity without symptoms.   pt with CAD RF demonstrates eagerness to participate in CR program. pt notes decreased angina with amlodipine.  pt pleased with continued workload increases without symptoms.    Expected Outcomes  pt will participate in CR exercise nutrition and lifestyle modification classes.   pt will participate in CR exercise nutrition and lifestyle modification classes.   pt will participate in CR exercise nutrition and lifestyle modification classes.  Core Components/Risk Factors/Patient Goals at Discharge (Final Review):  Goals and Risk Factor Review - 10/19/17 1531    Core Components/Risk Factors/Patient Goals Review          Personal Goals Review  Weight Management/Obesity;Lipids    Review  pt with CAD RF demonstrates eagerness to participate in CR program. pt notes decreased angina with amlodipine.  pt pleased with continued workload increases without symptoms.     Expected Outcomes  pt will participate in CR exercise nutrition and lifestyle modification classes.            ITP Comments: ITP  Comments    Row Name 07/30/17 9935 08/31/17 1707 09/23/17 1524 10/19/17 1531   ITP Comments  Dr. Fransico Him, Medical Director  30 day ITP review. pt with good attendance and participation. pt demonstrates eagerness to participate in CR program.   30 day ITP review. pt with good attendance and participation. pt demonstrates eagerness to participate in CR program.   30 day ITP review. pt with good attendance and participation. pt demonstrates eagerness to participate in CR program.       Comments:

## 2017-10-30 ENCOUNTER — Encounter (HOSPITAL_COMMUNITY)
Admission: RE | Admit: 2017-10-30 | Discharge: 2017-10-30 | Disposition: A | Discharge status: Home / Self Care | Payer: Medicare Other | Source: Ambulatory Visit | Attending: Cardiovascular Disease | Admitting: Cardiovascular Disease

## 2017-10-30 ENCOUNTER — Encounter (HOSPITAL_COMMUNITY): Payer: Medicare Other

## 2017-10-30 DIAGNOSIS — Z955 Presence of coronary angioplasty implant and graft: Secondary | ICD-10-CM

## 2017-11-02 ENCOUNTER — Encounter (HOSPITAL_COMMUNITY): Payer: Medicare Other

## 2017-11-02 ENCOUNTER — Encounter (HOSPITAL_COMMUNITY)
Admission: RE | Admit: 2017-11-02 | Discharge: 2017-11-02 | Disposition: A | Discharge status: Home / Self Care | Payer: Medicare Other | Source: Ambulatory Visit | Attending: Cardiovascular Disease | Admitting: Cardiovascular Disease

## 2017-11-02 DIAGNOSIS — Z955 Presence of coronary angioplasty implant and graft: Secondary | ICD-10-CM

## 2017-11-04 ENCOUNTER — Encounter (HOSPITAL_COMMUNITY)
Admission: RE | Admit: 2017-11-04 | Discharge: 2017-11-04 | Disposition: A | Discharge status: Home / Self Care | Payer: Medicare Other | Source: Ambulatory Visit | Attending: Cardiovascular Disease | Admitting: Cardiovascular Disease

## 2017-11-04 ENCOUNTER — Encounter (HOSPITAL_COMMUNITY): Payer: Medicare Other

## 2017-11-04 DIAGNOSIS — Z955 Presence of coronary angioplasty implant and graft: Secondary | ICD-10-CM

## 2017-11-06 ENCOUNTER — Encounter (HOSPITAL_COMMUNITY)
Admission: RE | Admit: 2017-11-06 | Discharge: 2017-11-06 | Disposition: A | Discharge status: Home / Self Care | Payer: Medicare Other | Source: Ambulatory Visit | Attending: Cardiovascular Disease | Admitting: Cardiovascular Disease

## 2017-11-06 DIAGNOSIS — Z955 Presence of coronary angioplasty implant and graft: Secondary | ICD-10-CM

## 2017-11-09 ENCOUNTER — Encounter (HOSPITAL_COMMUNITY): Payer: Self-pay

## 2017-11-09 ENCOUNTER — Encounter (HOSPITAL_COMMUNITY)
Admission: RE | Admit: 2017-11-09 | Discharge: 2017-11-09 | Disposition: A | Discharge status: Home / Self Care | Payer: Medicare Other | Source: Ambulatory Visit | Attending: Cardiovascular Disease | Admitting: Cardiovascular Disease

## 2017-11-09 VITALS — Wt 174.4 lb

## 2017-11-09 DIAGNOSIS — Z955 Presence of coronary angioplasty implant and graft: Secondary | ICD-10-CM

## 2017-11-10 ENCOUNTER — Encounter (HOSPITAL_COMMUNITY)
Admission: RE | Admit: 2017-11-10 | Discharge: 2017-11-10 | Disposition: A | Discharge status: Home / Self Care | Payer: Self-pay | Source: Ambulatory Visit | Attending: Cardiovascular Disease | Admitting: Cardiovascular Disease

## 2017-11-10 DIAGNOSIS — Z48812 Encounter for surgical aftercare following surgery on the circulatory system: Secondary | ICD-10-CM | POA: Insufficient documentation

## 2017-11-10 DIAGNOSIS — Z955 Presence of coronary angioplasty implant and graft: Secondary | ICD-10-CM | POA: Insufficient documentation

## 2017-11-11 ENCOUNTER — Encounter (HOSPITAL_COMMUNITY)
Admission: RE | Admit: 2017-11-11 | Discharge: 2017-11-11 | Disposition: A | Discharge status: Home / Self Care | Payer: Self-pay | Source: Ambulatory Visit | Attending: Cardiovascular Disease | Admitting: Cardiovascular Disease

## 2017-11-12 ENCOUNTER — Encounter (HOSPITAL_COMMUNITY)
Admission: RE | Admit: 2017-11-12 | Discharge: 2017-11-12 | Disposition: A | Discharge status: Home / Self Care | Payer: Self-pay | Source: Ambulatory Visit | Attending: Cardiovascular Disease | Admitting: Cardiovascular Disease

## 2017-11-17 ENCOUNTER — Encounter (HOSPITAL_COMMUNITY)
Admission: RE | Admit: 2017-11-17 | Discharge: 2017-11-17 | Disposition: A | Discharge status: Home / Self Care | Payer: Self-pay | Source: Ambulatory Visit | Attending: Cardiovascular Disease | Admitting: Cardiovascular Disease

## 2017-11-18 ENCOUNTER — Encounter (HOSPITAL_COMMUNITY)
Admission: RE | Admit: 2017-11-18 | Discharge: 2017-11-18 | Disposition: A | Discharge status: Home / Self Care | Payer: Self-pay | Source: Ambulatory Visit | Attending: Cardiovascular Disease | Admitting: Cardiovascular Disease

## 2017-11-19 ENCOUNTER — Encounter (HOSPITAL_COMMUNITY)
Admission: RE | Admit: 2017-11-19 | Discharge: 2017-11-19 | Disposition: A | Discharge status: Home / Self Care | Payer: Self-pay | Source: Ambulatory Visit | Attending: Cardiovascular Disease | Admitting: Cardiovascular Disease

## 2017-11-19 DIAGNOSIS — Z955 Presence of coronary angioplasty implant and graft: Secondary | ICD-10-CM | POA: Insufficient documentation

## 2017-11-19 DIAGNOSIS — Z48812 Encounter for surgical aftercare following surgery on the circulatory system: Secondary | ICD-10-CM | POA: Insufficient documentation

## 2017-11-24 ENCOUNTER — Encounter (HOSPITAL_COMMUNITY)
Admission: RE | Admit: 2017-11-24 | Discharge: 2017-11-24 | Disposition: A | Discharge status: Home / Self Care | Payer: Medicare Other | Source: Ambulatory Visit | Attending: Cardiovascular Disease | Admitting: Cardiovascular Disease

## 2017-11-25 ENCOUNTER — Encounter (HOSPITAL_COMMUNITY)
Admission: RE | Admit: 2017-11-25 | Discharge: 2017-11-25 | Disposition: A | Discharge status: Home / Self Care | Payer: Self-pay | Source: Ambulatory Visit | Attending: Cardiovascular Disease | Admitting: Cardiovascular Disease

## 2017-11-26 ENCOUNTER — Encounter (HOSPITAL_COMMUNITY)
Admission: RE | Admit: 2017-11-26 | Discharge: 2017-11-26 | Disposition: A | Discharge status: Home / Self Care | Payer: Self-pay | Source: Ambulatory Visit | Attending: Cardiovascular Disease | Admitting: Cardiovascular Disease

## 2017-11-27 NOTE — Progress Notes (Signed)
Discharge Progress Report  Patient Details  Name: Jose Warner MRN: [id number] Date of Birth: [birth year] Referring Provider:   Flowsheet Row CARDIAC REHAB PHASE II ORIENTATION from 07/30/2017 in [place]  Referring Provider  Shelva Majestic, MD       Number of Visits: 36   Reason for Discharge:  Patient has met program and personal goals.  Smoking History:  Social History   Tobacco Use  Smoking Status Never Smoker  Smokeless Tobacco Never Used    Diagnosis:  Status post coronary artery stent placement  ADL [facility]:   Initial Exercise Prescription: Initial Exercise Prescription - 07/30/17 1000    Date of Initial Exercise RX and Referring Provider          Date  07/30/17    Referring Provider  Shelva Majestic, MD        Treadmill          MPH  3    Grade  2    Minutes  10    METs  4.12        Bike          Level  1.2    Minutes  10    METs  3.83        NuStep          Level  4    SPM  80    Minutes  10    METs  3.5        Prescription Details          Frequency (times per week)  3    Duration  Progress to 30 minutes of continuous aerobic without signs/symptoms of physical distress        Intensity          THRR 40-80% of Max Heartrate  62-124    Ratings of Perceived Exertion  11-13    Perceived Dyspnea  0-4        Progression          Progression  Continue to progress workloads to maintain intensity without signs/symptoms of physical distress.        Resistance Training          Training Prescription  Yes    Weight  5lbs    Reps  10-15           Discharge Exercise Prescription (Final Exercise Prescription Changes): Exercise Prescription Changes - 11/09/17 1600    Response to Exercise          Blood Pressure (Admit)  110/72    Blood Pressure (Exercise)  140/84    Blood Pressure (Exit)  118/64    Heart Rate (Admit)  54 bpm    Heart Rate (Exercise)  137 bpm    Heart Rate (Exit)  66 bpm     Rating of Perceived Exertion (Exercise)  13    Symptoms  none    Duration  Progress to 30 minutes of  aerobic without signs/symptoms of physical distress    Intensity  THRR unchanged        Progression          Progression  Continue to progress workloads to maintain intensity without signs/symptoms of physical distress.    Average METs  8        Resistance Training          Training Prescription  Yes    Weight  6lbs    Reps  10-15  Time  10 Minutes        Treadmill          MPH  4    Grade  4.5    Minutes  10    METs  6.54        NuStep          Level  6    SPM  130    Minutes  10    METs  8.5        Rower          Level  5    Watts  104    Minutes  10    METs  9        Home Exercise Plan          Plans to continue exercise at  Home (comment)    Frequency  Add 2 additional days to program exercise sessions.    Initial Home Exercises Provided  08/26/17           Functional Capacity: 6 Minute Walk    6 Minute Walk    Row Name 07/30/17 0954 10/28/17 1112   Phase  Initial  Discharge   Distance  2160 feet  2621 feet   Distance % Change  no documentation  21.34 %   Distance Feet Change  no documentation  461 ft   Walk Time  6 minutes  6 minutes   # of Rest Breaks  0  0   MPH  4.1  5   METS  4.8  5.7   RPE  12  13   VO2 Peak  16.8  19.9   Symptoms  No  No   Resting HR  67 bpm  59 bpm   Resting BP  110/60  108/62   Resting Oxygen Saturation   99 %  no documentation   Exercise Oxygen Saturation  during 6 min walk  99 %  no documentation   Max Ex. HR  105 bpm  103 bpm   Max Ex. BP  132/60  146/72   2 Minute Post BP  118/72  105/60          Psychological, QOL, Others - Outcomes: PHQ 2/9: Depression screen [facility] 2/9 11/09/2017 08/03/2017  Decreased Interest 0 0  Down, Depressed, Hopeless 0 0  PHQ - 2 Score 0 0    Quality of Life: Quality of Life - 11/06/17 1148    Quality of Life          Select  Quality of Life        Quality of Life  Scores          Health/Function Pre  25.4 %    Health/Function Post  25.87 %    Health/Function % Change  1.85 %    Socioeconomic Pre  27.19 %    Socioeconomic Post  27.5 %    Socioeconomic % Change   1.14 %    Psych/Spiritual Pre  25.21 %    Psych/Spiritual Post  27 %    Psych/Spiritual % Change  7.1 %    Family Post  30 %    GLOBAL Pre  26.36 %    GLOBAL Post  27.06 %    GLOBAL % Change  2.66 %           Personal Goals: Goals established at orientation with interventions provided to work toward goal. Personal Goals and Risk Factors at Admission - 07/30/17 0347  Core Components/Risk Factors/Patient Goals on Admission           Weight Management  Yes;Weight Maintenance;Weight Loss    Intervention  Weight Management: Develop a combined nutrition and exercise program designed to reach desired caloric intake, while maintaining appropriate intake of nutrient and fiber, sodium and fats, and appropriate energy expenditure required for the weight goal.;Weight Management: Provide education and appropriate resources to help participant work on and attain dietary goals.;Weight Management/Obesity: Establish reasonable short term and long term weight goals.    Admit Weight  174 lb 6.1 oz (79.1 kg)    Goal Weight: Short Term  164 lb (74.4 kg)    Goal Weight: Long Term  164 lb (74.4 kg)    Expected Outcomes  Short Term: Continue to assess and modify interventions until short term weight is achieved;Weight Maintenance: Understanding of the daily nutrition guidelines, which includes 25-35% calories from fat, 7% or less cal from saturated fats, less than 269m cholesterol, less than 1.5gm of sodium, & 5 or more servings of fruits and vegetables daily;Weight Loss: Understanding of general recommendations for a balanced deficit meal plan, which promotes 1-2 lb weight loss per week and includes a negative energy balance of [phone] kcal/d;Understanding recommendations for meals to include 15-35% energy  as protein, 25-35% energy from fat, 35-60% energy from carbohydrates, less than 204mof dietary cholesterol, 20-35 gm of total fiber daily;Understanding of distribution of calorie intake throughout the day with the consumption of 4-5 meals/snacks    Lipids  Yes    Intervention  Provide education and support for participant on nutrition & aerobic/resistive exercise along with prescribed medications to achieve LDL <7067mHDL >47m37m  Expected Outcomes  Short Term: Participant states understanding of desired cholesterol values and is compliant with medications prescribed. Participant is following exercise prescription and nutrition guidelines.;Long Term: Cholesterol controlled with medications as prescribed, with individualized exercise RX and with personalized nutrition plan. Value goals: LDL < 70mg38mL > 40 mg.            Personal Goals Discharge: Goals and Risk Factor Review    Core Components/Risk Factors/Patient Goals Review    Row Name 08/31/17 1708 09/23/17 1524 10/19/17 1531 11/09/17 1027   Personal Goals Review  Weight Management/Obesity;Lipids  Weight Management/Obesity;Lipids  Weight Management/Obesity;Lipids  Weight Management/Obesity;Lipids   Review  pt with CAD RF demonstrates eagerness to participate in CR program. pt encouraged to attend exercise, nutrition and lifestyle modificaiton education classes.    pt with CAD RF demonstrates eagerness to participate in CR program. pt notes decreased angina with amlodipine.  pt pleased with increased physical activity without symptoms.   pt with CAD RF demonstrates eagerness to participate in CR program. pt notes decreased angina with amlodipine.  pt pleased with continued workload increases without symptoms.   [facility]. pt making significant lifestyle changes to decrease RF.  pt would like to continue his weight loss efforts and maintain wellness to stay out of hospital.  pt discouraged with heat intolerance,  temperature restrictions reveiwed with pt.    pt plans to continue exercising in CR maintenance program.     Expected Outcomes  pt will participate in CR exercise nutrition and lifestyle modification classes.   pt will participate in CR exercise nutrition and lifestyle modification classes.   pt will participate in CR exercise nutrition and lifestyle modification classes.   pt will participate in CR exercise nutrition and lifestyle modification opportunities in the community.  Exercise Goals and Review: Exercise Goals    Exercise Goals    Row Name 07/30/17 0857   Increase Physical Activity  Yes   Intervention  Provide advice, education, support and counseling about physical activity/exercise needs.;Develop an individualized exercise prescription for aerobic and resistive training based on initial evaluation findings, risk stratification, comorbidities and participant's personal goals.   Expected Outcomes  Short Term: Attend rehab on a regular basis to increase amount of physical activity.;Long Term: Exercising regularly at least 3-5 days a week.   Increase Strength and Stamina  Yes   Intervention  Provide advice, education, support and counseling about physical activity/exercise needs.;Develop an individualized exercise prescription for aerobic and resistive training based on initial evaluation findings, risk stratification, comorbidities and participant's personal goals.   Expected Outcomes  Short Term: Increase workloads from initial exercise prescription for resistance, speed, and METs.;Short Term: Perform resistance training exercises routinely during rehab and add in resistance training at home;Long Term: Improve cardiorespiratory fitness, muscular endurance and strength as measured by increased METs and functional capacity (6MWT)   Able to understand and use rate of perceived exertion (RPE) scale  Yes   Intervention  Provide education and explanation on how to use RPE scale    Expected Outcomes  Short Term: Able to use RPE daily in rehab to express subjective intensity level;Long Term:  Able to use RPE to guide intensity level when exercising independently   Knowledge and understanding of Target Heart Rate Range (THRR)  Yes   Intervention  Provide education and explanation of THRR including how the numbers were predicted and where they are located for reference   Expected Outcomes  Short Term: Able to state/look up THRR;Long Term: Able to use THRR to govern intensity when exercising independently;Short Term: Able to use daily as guideline for intensity in rehab   Able to check pulse independently  Yes   Intervention  Provide education and demonstration on how to check pulse in carotid and radial arteries.;Review the importance of being able to check your own pulse for safety during independent exercise   Expected Outcomes  Short Term: Able to explain why pulse checking is important during independent exercise;Long Term: Able to check pulse independently and accurately   Understanding of Exercise Prescription  Yes   Intervention  Provide education, explanation, and written materials on patient's individual exercise prescription   Expected Outcomes  Short Term: Able to explain program exercise prescription;Long Term: Able to explain home exercise prescription to exercise independently          Nutrition & Weight - Outcomes: Pre Biometrics - 07/30/17 1018    Pre Biometrics          Height  5' 8"  (1.727 m)    Weight  79.1 kg    Waist Circumference  38 inches    Hip Circumference  39.75 inches    Waist to Hip Ratio  0.96 %    BMI (Calculated)  26.52    Triceps Skinfold  21 mm    % Body Fat  27.5 %    Grip Strength  42 kg    Flexibility  13 in    Single Leg Stand  30 seconds          Post Biometrics - 11/09/17 1627     Post  Biometrics          Weight  79.1 kg    BMI (Calculated)  26.52           Nutrition: Nutrition  Therapy & Goals - 11/11/17 1538     Nutrition Therapy          Diet  Heart Healthy, Gluten Free        Personal Nutrition Goals          Nutrition Goal  Pt to identify food quantities necessary to achieve weight loss of 6-10 lb at graduation from cardiac rehab. Goal wt of 160 lb desired.    Personal Goal #2  Pt able to name foods that affect blood glucose.   nutrition goal met   Personal Goal #3  Pt to describe the benefit of including fruits, vegetables, whole grains, and low-fat dairy products in a heart healthy meal plan.   nutrition goal met       Intervention Plan          Intervention  Prescribe, educate and counsel regarding individualized specific dietary modifications aiming towards targeted core components such as weight, hypertension, lipid management, diabetes, heart failure and other comorbidities.    Expected Outcomes  Short Term Goal: Understand basic principles of dietary content, such as calories, fat, sodium, cholesterol and nutrients.;Long Term Goal: Adherence to prescribed nutrition plan.           Nutrition Discharge: Nutrition Assessments - 11/11/17 1536    MEDFICTS Scores          Pre Score  61    Post Score  40    Score Difference  -21           Education Questionnaire Score: Knowledge Questionnaire Score - 11/09/17 1629    Knowledge Questionnaire Score          Post Score  23/24           Goals reviewed with patient; copy given to patient.

## 2017-12-01 ENCOUNTER — Encounter (HOSPITAL_COMMUNITY): Payer: Self-pay

## 2017-12-02 ENCOUNTER — Encounter (HOSPITAL_COMMUNITY): Payer: Self-pay

## 2017-12-02 ENCOUNTER — Telehealth: Payer: Self-pay | Admitting: Cardiovascular Disease

## 2017-12-02 NOTE — Telephone Encounter (Signed)
New Message:    Pt is calling and states he was to have a 3 mnth f/u per Claiborne Billings but for some reason the appt couldn't be made at that time. Dr. Evette Georges schedule is now out til Nov. Please advise pt on this appt

## 2017-12-02 NOTE — Telephone Encounter (Signed)
Spoke with pt who states at last OV, he was advised to return in 2-3 months. Earliest appointment for Dr. Claiborne Billings is in November. Pt voiced that he didn't want to wait that long and was told he could be worked into the scheduled. He denies any symptoms at this moment but states he has some questions that he would like addressed. Routing to Nurse.

## 2017-12-03 ENCOUNTER — Encounter (HOSPITAL_COMMUNITY): Payer: Self-pay

## 2017-12-08 ENCOUNTER — Encounter (HOSPITAL_COMMUNITY)
Admission: RE | Admit: 2017-12-08 | Discharge: 2017-12-08 | Disposition: A | Discharge status: Home / Self Care | Payer: Medicare Other | Source: Ambulatory Visit | Attending: Cardiovascular Disease | Admitting: Cardiovascular Disease

## 2017-12-09 ENCOUNTER — Encounter (HOSPITAL_COMMUNITY)
Admission: RE | Admit: 2017-12-09 | Discharge: 2017-12-09 | Disposition: A | Discharge status: Home / Self Care | Payer: Self-pay | Source: Ambulatory Visit | Attending: Cardiovascular Disease | Admitting: Cardiovascular Disease

## 2017-12-10 ENCOUNTER — Encounter (HOSPITAL_COMMUNITY)
Admission: RE | Admit: 2017-12-10 | Discharge: 2017-12-10 | Disposition: A | Discharge status: Home / Self Care | Payer: Self-pay | Source: Ambulatory Visit | Attending: Cardiovascular Disease | Admitting: Cardiovascular Disease

## 2017-12-10 NOTE — Telephone Encounter (Signed)
Message sent to scheduling 

## 2017-12-14 NOTE — Telephone Encounter (Signed)
Patient scheduled to see Dr. Claiborne Billings 10/1

## 2017-12-15 ENCOUNTER — Encounter (HOSPITAL_COMMUNITY)
Admission: RE | Admit: 2017-12-15 | Discharge: 2017-12-15 | Disposition: A | Discharge status: Home / Self Care | Payer: Medicare Other | Source: Ambulatory Visit | Attending: Cardiovascular Disease | Admitting: Cardiovascular Disease

## 2017-12-16 ENCOUNTER — Encounter (HOSPITAL_COMMUNITY)
Admission: RE | Admit: 2017-12-16 | Discharge: 2017-12-16 | Disposition: A | Discharge status: Home / Self Care | Payer: Medicare Other | Source: Ambulatory Visit | Attending: Cardiovascular Disease | Admitting: Cardiovascular Disease

## 2017-12-17 ENCOUNTER — Encounter (HOSPITAL_COMMUNITY)
Admission: RE | Admit: 2017-12-17 | Discharge: 2017-12-17 | Disposition: A | Discharge status: Home / Self Care | Payer: Medicare Other | Source: Ambulatory Visit | Attending: Cardiovascular Disease | Admitting: Cardiovascular Disease

## 2017-12-22 ENCOUNTER — Encounter (HOSPITAL_COMMUNITY)
Admission: RE | Admit: 2017-12-22 | Discharge: 2017-12-22 | Disposition: A | Discharge status: Home / Self Care | Payer: Self-pay | Source: Ambulatory Visit | Attending: Cardiovascular Disease | Admitting: Cardiovascular Disease

## 2017-12-22 DIAGNOSIS — Z955 Presence of coronary angioplasty implant and graft: Secondary | ICD-10-CM | POA: Insufficient documentation

## 2017-12-22 DIAGNOSIS — Z48812 Encounter for surgical aftercare following surgery on the circulatory system: Secondary | ICD-10-CM | POA: Insufficient documentation

## 2017-12-23 ENCOUNTER — Encounter (HOSPITAL_COMMUNITY)
Admission: RE | Admit: 2017-12-23 | Discharge: 2017-12-23 | Disposition: A | Discharge status: Home / Self Care | Payer: Self-pay | Source: Ambulatory Visit | Attending: Cardiovascular Disease | Admitting: Cardiovascular Disease

## 2017-12-24 ENCOUNTER — Encounter (HOSPITAL_COMMUNITY)
Admission: RE | Admit: 2017-12-24 | Discharge: 2017-12-24 | Disposition: A | Discharge status: Home / Self Care | Payer: Self-pay | Source: Ambulatory Visit | Attending: Cardiovascular Disease | Admitting: Cardiovascular Disease

## 2017-12-24 NOTE — Addendum Note (Signed)
Encounter addended by: Lowell Guitar, RN on: 12/24/2017 9:47 AM  Actions taken: Episode resolved

## 2017-12-28 ENCOUNTER — Observation Stay (HOSPITAL_BASED_OUTPATIENT_CLINIC_OR_DEPARTMENT_OTHER)
Admission: EM | Admit: 2017-12-28 | Discharge: 2017-12-30 | Disposition: A | Discharge status: Home / Self Care | Payer: Medicare Other | Attending: Internal Medicine | Admitting: Internal Medicine

## 2017-12-28 ENCOUNTER — Encounter (HOSPITAL_BASED_OUTPATIENT_CLINIC_OR_DEPARTMENT_OTHER): Payer: Self-pay | Admitting: Emergency Medicine

## 2017-12-28 ENCOUNTER — Observation Stay (HOSPITAL_BASED_OUTPATIENT_CLINIC_OR_DEPARTMENT_OTHER): Payer: Medicare Other

## 2017-12-28 ENCOUNTER — Emergency Department (HOSPITAL_BASED_OUTPATIENT_CLINIC_OR_DEPARTMENT_OTHER): Payer: Medicare Other

## 2017-12-28 ENCOUNTER — Other Ambulatory Visit: Payer: Self-pay

## 2017-12-28 DIAGNOSIS — R0782 Intercostal pain: Secondary | ICD-10-CM

## 2017-12-28 DIAGNOSIS — K224 Dyskinesia of esophagus: Secondary | ICD-10-CM | POA: Insufficient documentation

## 2017-12-28 DIAGNOSIS — K439 Ventral hernia without obstruction or gangrene: Secondary | ICD-10-CM | POA: Diagnosis not present

## 2017-12-28 DIAGNOSIS — I10 Essential (primary) hypertension: Secondary | ICD-10-CM | POA: Diagnosis not present

## 2017-12-28 DIAGNOSIS — K219 Gastro-esophageal reflux disease without esophagitis: Secondary | ICD-10-CM | POA: Insufficient documentation

## 2017-12-28 DIAGNOSIS — R079 Chest pain, unspecified: Secondary | ICD-10-CM | POA: Diagnosis present

## 2017-12-28 DIAGNOSIS — Z888 Allergy status to other drugs, medicaments and biological substances status: Secondary | ICD-10-CM | POA: Diagnosis not present

## 2017-12-28 DIAGNOSIS — Z8249 Family history of ischemic heart disease and other diseases of the circulatory system: Secondary | ICD-10-CM | POA: Insufficient documentation

## 2017-12-28 DIAGNOSIS — E782 Mixed hyperlipidemia: Secondary | ICD-10-CM

## 2017-12-28 DIAGNOSIS — Z955 Presence of coronary angioplasty implant and graft: Secondary | ICD-10-CM | POA: Insufficient documentation

## 2017-12-28 DIAGNOSIS — K5730 Diverticulosis of large intestine without perforation or abscess without bleeding: Secondary | ICD-10-CM | POA: Diagnosis not present

## 2017-12-28 DIAGNOSIS — I700 Atherosclerosis of aorta: Secondary | ICD-10-CM | POA: Diagnosis not present

## 2017-12-28 DIAGNOSIS — I300 Acute nonspecific idiopathic pericarditis: Secondary | ICD-10-CM

## 2017-12-28 DIAGNOSIS — Z7901 Long term (current) use of anticoagulants: Secondary | ICD-10-CM | POA: Insufficient documentation

## 2017-12-28 DIAGNOSIS — M47812 Spondylosis without myelopathy or radiculopathy, cervical region: Secondary | ICD-10-CM | POA: Insufficient documentation

## 2017-12-28 DIAGNOSIS — I2510 Atherosclerotic heart disease of native coronary artery without angina pectoris: Secondary | ICD-10-CM | POA: Insufficient documentation

## 2017-12-28 DIAGNOSIS — K900 Celiac disease: Secondary | ICD-10-CM | POA: Diagnosis not present

## 2017-12-28 DIAGNOSIS — Z79899 Other long term (current) drug therapy: Secondary | ICD-10-CM | POA: Insufficient documentation

## 2017-12-28 DIAGNOSIS — Z8546 Personal history of malignant neoplasm of prostate: Secondary | ICD-10-CM | POA: Insufficient documentation

## 2017-12-28 DIAGNOSIS — E785 Hyperlipidemia, unspecified: Secondary | ICD-10-CM | POA: Diagnosis not present

## 2017-12-28 DIAGNOSIS — K760 Fatty (change of) liver, not elsewhere classified: Secondary | ICD-10-CM | POA: Insufficient documentation

## 2017-12-28 DIAGNOSIS — R0789 Other chest pain: Principal | ICD-10-CM

## 2017-12-28 DIAGNOSIS — I25119 Atherosclerotic heart disease of native coronary artery with unspecified angina pectoris: Secondary | ICD-10-CM

## 2017-12-28 DIAGNOSIS — N5231 Erectile dysfunction following radical prostatectomy: Secondary | ICD-10-CM | POA: Diagnosis not present

## 2017-12-28 DIAGNOSIS — Z7902 Long term (current) use of antithrombotics/antiplatelets: Secondary | ICD-10-CM | POA: Diagnosis not present

## 2017-12-28 DIAGNOSIS — Z882 Allergy status to sulfonamides status: Secondary | ICD-10-CM | POA: Diagnosis not present

## 2017-12-28 DIAGNOSIS — I25118 Atherosclerotic heart disease of native coronary artery with other forms of angina pectoris: Secondary | ICD-10-CM | POA: Diagnosis not present

## 2017-12-28 DIAGNOSIS — I480 Paroxysmal atrial fibrillation: Secondary | ICD-10-CM | POA: Diagnosis not present

## 2017-12-28 LAB — CBC WITH DIFFERENTIAL/PLATELET
Basophils Absolute: 0 10*3/uL (ref 0.0–0.1)
Basophils Relative: 0 %
EOS ABS: 0.2 10*3/uL (ref 0.0–0.7)
Eosinophils Relative: 3 %
HEMATOCRIT: 44.6 % (ref 39.0–52.0)
HEMOGLOBIN: 15 g/dL (ref 13.0–17.0)
LYMPHS ABS: 2.5 10*3/uL (ref 0.7–4.0)
LYMPHS PCT: 43 %
MCH: 28.8 pg (ref 26.0–34.0)
MCHC: 33.6 g/dL (ref 30.0–36.0)
MCV: 85.6 fL (ref 78.0–100.0)
MONOS PCT: 12 %
Monocytes Absolute: 0.7 10*3/uL (ref 0.1–1.0)
Neutro Abs: 2.4 10*3/uL (ref 1.7–7.7)
Neutrophils Relative %: 42 %
Platelets: 179 10*3/uL (ref 150–400)
RBC: 5.21 MIL/uL (ref 4.22–5.81)
RDW: 13.7 % (ref 11.5–15.5)
WBC: 5.9 10*3/uL (ref 4.0–10.5)

## 2017-12-28 LAB — BASIC METABOLIC PANEL
Anion gap: 11 (ref 5–15)
BUN: 16 mg/dL (ref 8–23)
CHLORIDE: 108 mmol/L (ref 98–111)
CO2: 21 mmol/L — AB (ref 22–32)
CREATININE: 0.95 mg/dL (ref 0.61–1.24)
Calcium: 8.8 mg/dL — ABNORMAL LOW (ref 8.9–10.3)
GFR calc Af Amer: >60 mL/min (ref >60)
GFR calc non Af Amer: >60 mL/min (ref >60)
Glucose, Bld: 115 mg/dL — ABNORMAL HIGH (ref 70–99)
POTASSIUM: 3.8 mmol/L (ref 3.5–5.1)
SODIUM: 140 mmol/L (ref 135–145)

## 2017-12-28 LAB — C-REACTIVE PROTEIN: CRP: <0.8 mg/dL (ref <1.0)

## 2017-12-28 LAB — TROPONIN I
Troponin I: <0.03 ng/mL (ref <0.03)
Troponin I: <0.03 ng/mL (ref <0.03)

## 2017-12-28 LAB — D-DIMER, QUANTITATIVE (NOT AT ARMC): D-Dimer, Quant: <0.27 ug/mL-FEU (ref 0.00–0.50)

## 2017-12-28 LAB — MAGNESIUM: Magnesium: 2.1 mg/dL (ref 1.7–2.4)

## 2017-12-28 LAB — SEDIMENTATION RATE: Sed Rate: 1 mm/hr (ref 0–16)

## 2017-12-28 LAB — PHOSPHORUS: Phosphorus: 3.7 mg/dL (ref 2.5–4.6)

## 2017-12-28 MED ORDER — NIACIN ER 500 MG PO CPCR
1000.0000 mg | ORAL_CAPSULE | Freq: Every day | ORAL | Status: DC
  Administered 2017-12-28 – 2017-12-29 (×2): 1000 mg via ORAL
  Filled 2017-12-28 (×2): qty 2

## 2017-12-28 MED ORDER — ASPIRIN EC 325 MG PO TBEC: 325.0000 mg | DELAYED_RELEASE_TABLET | Freq: Every day | ORAL | Status: DC

## 2017-12-28 MED ORDER — OXYCODONE HCL ER 10 MG PO T12A
10.0000 mg | EXTENDED_RELEASE_TABLET | Freq: Two times a day (BID) | ORAL | Status: DC
  Administered 2017-12-28 – 2017-12-30 (×4): 10 mg via ORAL
  Filled 2017-12-28 (×4): qty 1

## 2017-12-28 MED ORDER — IOPAMIDOL (ISOVUE-370) INJECTION 76%
100.0000 mL | Freq: Once | INTRAVENOUS | Status: AC | PRN
  Administered 2017-12-28: 100 mL via INTRAVENOUS

## 2017-12-28 MED ORDER — GI COCKTAIL ~~LOC~~
30.0000 mL | Freq: Once | ORAL | Status: AC
  Administered 2017-12-28: 30 mL via ORAL
  Filled 2017-12-28: qty 30

## 2017-12-28 MED ORDER — IBUPROFEN 400 MG PO TABS
400.0000 mg | ORAL_TABLET | Freq: Three times a day (TID) | ORAL | Status: DC
  Administered 2017-12-28 – 2017-12-30 (×6): 400 mg via ORAL
  Filled 2017-12-28 (×6): qty 1

## 2017-12-28 MED ORDER — PRAVASTATIN SODIUM 40 MG PO TABS
80.0000 mg | ORAL_TABLET | Freq: Every day | ORAL | Status: DC
  Administered 2017-12-28 – 2017-12-30 (×3): 80 mg via ORAL
  Filled 2017-12-28 (×4): qty 2

## 2017-12-28 MED ORDER — ALBUTEROL SULFATE (2.5 MG/3ML) 0.083% IN NEBU: 2.5000 mg | INHALATION_SOLUTION | Freq: Four times a day (QID) | RESPIRATORY_TRACT | Status: DC | PRN

## 2017-12-28 MED ORDER — AMLODIPINE BESYLATE 5 MG PO TABS
2.5000 mg | ORAL_TABLET | Freq: Every evening | ORAL | Status: DC
  Administered 2017-12-28 – 2017-12-29 (×2): 2.5 mg via ORAL
  Filled 2017-12-28 (×2): qty 1

## 2017-12-28 MED ORDER — HYDROCODONE-ACETAMINOPHEN 5-325 MG PO TABS: 1.0000 | ORAL_TABLET | ORAL | Status: DC | PRN

## 2017-12-28 MED ORDER — VITAMIN C 500 MG PO TABS
500.0000 mg | ORAL_TABLET | Freq: Every evening | ORAL | Status: DC
  Administered 2017-12-28 – 2017-12-29 (×2): 500 mg via ORAL
  Filled 2017-12-28 (×2): qty 1

## 2017-12-28 MED ORDER — VITAMIN D (ERGOCALCIFEROL) 50 MCG (2000 UT) PO CAPS: 1.0000 | ORAL_CAPSULE | Freq: Every day | ORAL | Status: DC

## 2017-12-28 MED ORDER — VITAMIN D 1000 UNITS PO TABS
2000.0000 [IU] | ORAL_TABLET | Freq: Every day | ORAL | Status: DC
  Administered 2017-12-28 – 2017-12-29 (×2): 2000 [IU] via ORAL
  Filled 2017-12-28 (×2): qty 2

## 2017-12-28 MED ORDER — PANTOPRAZOLE SODIUM 40 MG PO TBEC
40.0000 mg | DELAYED_RELEASE_TABLET | Freq: Two times a day (BID) | ORAL | Status: DC
  Administered 2017-12-28 – 2017-12-30 (×4): 40 mg via ORAL
  Filled 2017-12-28 (×4): qty 1

## 2017-12-28 MED ORDER — HYDROMORPHONE HCL 1 MG/ML IJ SOLN
1.0000 mg | Freq: Once | INTRAMUSCULAR | Status: AC
  Administered 2017-12-28: 1 mg via INTRAVENOUS
  Filled 2017-12-28: qty 1

## 2017-12-28 MED ORDER — COLCHICINE 0.6 MG PO TABS
0.6000 mg | ORAL_TABLET | Freq: Two times a day (BID) | ORAL | Status: DC
  Administered 2017-12-28 – 2017-12-30 (×4): 0.6 mg via ORAL
  Filled 2017-12-28 (×4): qty 1

## 2017-12-28 MED ORDER — RIVAROXABAN 20 MG PO TABS
20.0000 mg | ORAL_TABLET | Freq: Every day | ORAL | Status: DC
  Administered 2017-12-28 – 2017-12-29 (×2): 20 mg via ORAL
  Filled 2017-12-28 (×2): qty 1

## 2017-12-28 MED ORDER — POTASSIUM CHLORIDE IN NACL 20-0.9 MEQ/L-% IV SOLN
INTRAVENOUS | Status: AC
  Administered 2017-12-28: 19:00:00 via INTRAVENOUS
  Filled 2017-12-28: qty 1000

## 2017-12-28 MED ORDER — BISOPROLOL FUMARATE 5 MG PO TABS
2.5000 mg | ORAL_TABLET | ORAL | Status: DC
  Administered 2017-12-29 – 2017-12-30 (×2): 2.5 mg via ORAL
  Filled 2017-12-28 (×2): qty 1

## 2017-12-28 MED ORDER — KETOROLAC TROMETHAMINE 30 MG/ML IJ SOLN
30.0000 mg | Freq: Once | INTRAMUSCULAR | Status: AC
  Administered 2017-12-28: 30 mg via INTRAVENOUS
  Filled 2017-12-28: qty 1

## 2017-12-28 MED ORDER — ASPIRIN EC 81 MG PO TBEC: 81.0000 mg | DELAYED_RELEASE_TABLET | Freq: Every day | ORAL | Status: DC

## 2017-12-28 MED ORDER — MOMETASONE FURO-FORMOTEROL FUM 200-5 MCG/ACT IN AERO
2.0000 | INHALATION_SPRAY | Freq: Two times a day (BID) | RESPIRATORY_TRACT | Status: DC
  Filled 2017-12-28: qty 8.8

## 2017-12-28 MED ORDER — ONDANSETRON HCL 4 MG/2ML IJ SOLN
4.0000 mg | Freq: Once | INTRAMUSCULAR | Status: AC
  Administered 2017-12-28: 4 mg via INTRAVENOUS
  Filled 2017-12-28: qty 2

## 2017-12-28 MED ORDER — EZETIMIBE 10 MG PO TABS
10.0000 mg | ORAL_TABLET | Freq: Every evening | ORAL | Status: DC
  Administered 2017-12-28 – 2017-12-29 (×2): 10 mg via ORAL
  Filled 2017-12-28 (×2): qty 1

## 2017-12-28 MED ORDER — NITROGLYCERIN 0.4 MG SL SUBL: 0.4000 mg | SUBLINGUAL_TABLET | SUBLINGUAL | Status: DC | PRN

## 2017-12-28 MED ORDER — CLOPIDOGREL BISULFATE 75 MG PO TABS
75.0000 mg | ORAL_TABLET | ORAL | Status: DC
  Administered 2017-12-29 – 2017-12-30 (×2): 75 mg via ORAL
  Filled 2017-12-28 (×2): qty 1

## 2017-12-28 MED ORDER — MORPHINE SULFATE (PF) 4 MG/ML IV SOLN
4.0000 mg | Freq: Once | INTRAVENOUS | Status: AC
  Administered 2017-12-28: 4 mg via INTRAVENOUS
  Filled 2017-12-28: qty 1

## 2017-12-28 MED ORDER — LORAZEPAM 2 MG/ML IJ SOLN
0.5000 mg | Freq: Once | INTRAMUSCULAR | Status: AC
  Administered 2017-12-28: 0.5 mg via INTRAVENOUS
  Filled 2017-12-28: qty 1

## 2017-12-28 MED ORDER — KETOROLAC TROMETHAMINE 15 MG/ML IJ SOLN: 15.0000 mg | Freq: Four times a day (QID) | INTRAMUSCULAR | Status: DC | PRN

## 2017-12-28 MED ORDER — MORPHINE SULFATE (PF) 2 MG/ML IV SOLN: 2.0000 mg | INTRAVENOUS | Status: DC | PRN

## 2017-12-28 MED ORDER — NIACIN ER (ANTIHYPERLIPIDEMIC) 1000 MG PO TBCR: 1000.0000 mg | EXTENDED_RELEASE_TABLET | Freq: Every day | ORAL | Status: DC

## 2017-12-28 MED ORDER — NITROGLYCERIN 2 % TD OINT
1.0000 [in_us] | TOPICAL_OINTMENT | Freq: Once | TRANSDERMAL | Status: AC
  Administered 2017-12-28: 1 [in_us] via TOPICAL
  Filled 2017-12-28: qty 1

## 2017-12-28 NOTE — ED Notes (Signed)
Consult to cardiology

## 2017-12-28 NOTE — Care Management (Signed)
This is a no charge note  Pending admission per Dr.   Nicki Guadalajara from [facility] per Dr. Lucas Mallow  67 year old man with past medical history of hypertension, hyperlipidemia, GERD, atrial fibrillation on Xarelto, CAD, stent placement, prostate cancer, celiac disease, who presents with chest pain since midnight.    Chest pain is located in the central chest, radiating to the left arm and neck.  Troponin negative.  EKG showed QTC 468, LAD, nonspecific T wave change.  Chest x-ray negative.  No fever or leukocytosis.  Hemodynamically stable. Patient is followed by Dr. Claiborne Billings.  Catheterization in February 2019 with stent to the mid LAD.  Repeat catheterization in April 2019 for recurrent chest pain.   EDP discussed with cardiologist, Dr. Harrell Gave, who recommended to admitted to the hospital.  will need to call cardiology again if needed. Pt still has CP after treated with morphine and nitroglycerin patch. Pt is placed in SDU for obs.   Please call manager of [facility] at [phone] when pt arrives to floor   Ivor Costa, MD  [facility] Pager [phone]  If 7PM-7AM, please contact night-coverage [email or web address] Password [facility] 12/28/2017, 6:56 AM

## 2017-12-28 NOTE — ED Provider Notes (Signed)
[facility] Provider Note   CSN: [id number] Arrival date & time: 12/28/17  0524     History   Chief Complaint Chief Complaint  Patient presents with  . Chest Pain    HPI Jose Warner is a 67 y.o. male.  HPI  This is a 67 year old male with a history of coronary artery disease, paroxysmal atrial fibrillation on Xarelto, hypertension, hyperlipidemia who presents with chest pain.  Patient reports that he has had chest pain since 2300 last night.  He reports that it is left-sided and radiates into his jaw and his left arm.  He describes it as pressure.  Denies any ripping or tearing nature to the pain.  Denies radiation to the back.  It is worse with movement and breathing.  Really he rates his pain at 5 out of 10.  It is different from when he had his stent placed.  He states that that pain was more anterior.  He does not seem to have any worsening of pain with eating or exertion.  He took a nitroglycerin this morning with minimal to no relief.  Denies any recent fevers, cough, shortness of breath.  Denies any leg swelling or history of blood clots.  He is compliant with his Xarelto and blood pressure medications.    Patient is followed by Dr. Claiborne Billings.  Chart reviewed.  Catheterization in February 2019 with stent to the mid LAD.  Repeat catheterization in April 2019 for recurrent chest pain.  He had some residual mild disease.  Medical management recommended.  No in-stent stenosis.  Question vasospasm.  Chart review even including his admission in February with stent placement, patient did not have any positive cardiac enzymes.  Past Medical History:  Diagnosis Date  . Anemia   . Atrial fibrillation ([place]) 06/05/2017  . Bronchitis 05-10-12   past fall- 4 runs antibiotics due to bronchitis-uses [employer] as needed  . Celiac disease   . Colon polyps    adenomatous  . Coronary artery disease   . Diverticulosis   . GERD (gastroesophageal reflux disease)   .  Hepatitis 05-10-12   "was told non A, non B"-unclean dental equip.  Marland Kitchen Hyperlipidemia   . Iron deficiency anemia   . Prostate cancer ([place]) 05-10-12   ;bx. 04-12-12, dx  . Spasm of esophagus 2013    Patient Active Problem List   Diagnosis Date Noted  . Chronic anticoagulation 08/17/2017  . Asthma 08/17/2017  . Unstable angina ([place]) 08/17/2017  . ACS (acute coronary syndrome) ([place]) 06/14/2017  . GERD (gastroesophageal reflux disease) 06/10/2017  . Chest pain 06/09/2017  . 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 FHx: coronary artery disease 11/26/2012  . H/O prostate cancer 11/26/2012  . Low HDL (under 40) 11/26/2012  . Paroxysmal atrial fibrillation ([place]) 11/26/2012  . CAD S/P percutaneous coronary angioplasty 11/26/2012    Past Surgical History:  Procedure Laterality Date  . CARDIAC CATHETERIZATION  09/13/2004   LAD:30%-40% in prox. to mid segment with 20% in the mid segment and 20% in left Circ., mild 20% right common iliac narrowing. medical therapy  . CARDIOVERSION  06/05/2017  . CORONARY STENT INTERVENTION N/A 06/11/2017   Procedure: CORONARY STENT INTERVENTION;  Surgeon: Belva Crome, MD;  Location: [facility];  Service: Cardiovascular;  Laterality: N/A;  . HERNIA REPAIR  5 yrs ago   right   . LEFT HEART CATH AND CORONARY ANGIOGRAPHY N/A 06/11/2017   Procedure: LEFT HEART CATH AND CORONARY  ANGIOGRAPHY;  Surgeon: Belva Crome, MD;  Location: [facility];  Service: Cardiovascular;  Laterality: N/A;  . LEFT HEART CATH AND CORONARY ANGIOGRAPHY N/A 08/18/2017   Procedure: LEFT HEART CATH AND CORONARY ANGIOGRAPHY;  Surgeon: Martinique, Peter M, MD;  Location: [facility];  Service: Cardiovascular;  Laterality: N/A;  . NM MYOCAR PERF WALL MOTION  11/30/2007   protocol:Bruce, post EF 67%,mild perfusion defect seen in basal inferior consistant with Attenuation artifact, exercise cap. 12METS  . ROBOT ASSISTED LAPAROSCOPIC RADICAL  PROSTATECTOMY  05/20/2012   Procedure: ROBOTIC ASSISTED LAPAROSCOPIC RADICAL PROSTATECTOMY LEVEL 1;  Surgeon: Dutch Gray, MD;  Location: [facility];  Service: Urology;  Laterality: N/A;     . TRANSURETHRAL RESECTION OF PROSTATE  72yrs ago        Home Medications    Prior to Admission medications   Medication Sig Start Date End Date Taking? Authorizing Provider  ascorbic acid (VITAMIN C) 500 MG tablet Take 500 mg by mouth daily.   Yes [provider]  ezetimibe (ZETIA) 10 MG tablet Take 10 mg by mouth daily.   Yes [provider]  amLODipine (NORVASC) 2.5 MG tablet Take 1 tablet (2.5 mg total) by mouth daily. 08/31/17 11/29/17  Troy Sine, MD  bisoprolol (ZEBETA) 5 MG tablet Take 0.5 tablets (2.5 mg total) by mouth daily. 08/31/17   Troy Sine, MD  budesonide-formoterol [facility]) 160-4.5 MCG/ACT inhaler Inhale 2 puffs into the lungs 2 (two) times daily as needed (for cough).     [provider]  clopidogrel (PLAVIX) 75 MG tablet Take 1 tablet (75 mg total) by mouth daily. 07/02/17   Troy Sine, MD  guaiFENesin (MUCINEX) 600 MG 12 hr tablet Take 600 mg by mouth 2 (two) times daily as needed.    [provider]  Multiple Vitamins-Minerals (MULTIVITAMIN PO) Take 1 tablet by mouth at bedtime.     [provider]  niacin (NIASPAN) 1000 MG CR tablet Take 1 tablet by mouth at bedtime.  12/04/15   [provider]  nitroGLYCERIN (NITROSTAT) 0.4 MG SL tablet Place 1 tablet (0.4 mg total) under the tongue every 5 (five) minutes x 3 doses as needed for chest pain. 06/15/17   Kalman Shan Ratliff, DO  pantoprazole (PROTONIX) 40 MG tablet Take 80 mg by mouth daily.  06/24/17   [provider]  pravastatin (PRAVACHOL) 80 MG tablet Take 80 mg by mouth daily.    [provider]  PROAIR HFA 108 (90 BASE) MCG/ACT inhaler Take 2 puffs by mouth 2 (two) times daily as needed. For shortness of breath. 02/13/12   [provider]    rivaroxaban (XARELTO) 20 MG TABS tablet Take 1 tablet (20 mg total) by mouth daily with supper. 08/31/17   Troy Sine, MD  rivaroxaban (XARELTO) 20 MG TABS tablet Take 1 tablet (20 mg total) by mouth daily. 09/03/17   Troy Sine, MD  Vitamin D, Ergocalciferol, 2000 units CAPS Take 1 capsule by mouth at bedtime.     [provider]    Family History Family History  Problem Relation Age of Onset  . Stroke Mother   . Heart disease Father   . Heart attack Father   . Cancer Sister   . Stroke Maternal Grandfather   . Cancer Paternal Grandmother   . Heart attack Paternal Grandfather   . Heart disease Paternal Grandfather   . Colon cancer Neg Hx   . Esophageal cancer Neg Hx   .  Pancreatic cancer Neg Hx   . Rectal cancer Neg Hx   . Stomach cancer Neg Hx     Social History Social History   Tobacco Use  . Smoking status: Never Smoker  . Smokeless tobacco: Never Used  Substance Use Topics  . Alcohol use: Yes    Alcohol/week: 2.0 standard drinks    Types: 2 Glasses of wine per week    Comment: occ. x 2 drinks weekly  . Drug use: No     Allergies   Gluten meal; Wheat bran; Statins; and Sulfa antibiotics   Review of Systems Review of Systems  Constitutional: Negative for fever.  Respiratory: Negative for shortness of breath.   Cardiovascular: Positive for chest pain. Negative for palpitations and leg swelling.  Gastrointestinal: Negative for abdominal pain, nausea and vomiting.  Genitourinary: Negative for dysuria.  All other systems reviewed and are negative.    Physical Exam Updated Vital Signs BP 136/78   Pulse 62   Temp 97.7 F (36.5 C) (Oral)   Resp 17   SpO2 100%   Physical Exam  Constitutional: He is oriented to person, place, and time. He appears well-developed and well-nourished. No distress.  Appears uncomfortable  HENT:  Head: Normocephalic and atraumatic.  Neck: Neck supple.  Cardiovascular: Normal rate, regular rhythm, normal heart  sounds and normal pulses.  No murmur heard. Pulmonary/Chest: Effort normal and breath sounds normal. No respiratory distress. He has no wheezes.  Abdominal: Soft. Bowel sounds are normal. There is no tenderness. There is no rebound.  Musculoskeletal: He exhibits no edema.  Lymphadenopathy:    He has no cervical adenopathy.  Neurological: He is alert and oriented to person, place, and time.  Skin: Skin is warm and dry.  Psychiatric: He has a normal mood and affect.  Nursing note and vitals reviewed.    ED Treatments / Results  Labs (all labs ordered are listed, but only abnormal results are displayed) Labs Reviewed  BASIC METABOLIC PANEL - Abnormal; Notable for the following components:      Result Value   CO2 21 (*)    Glucose, Bld 115 (*)    Calcium 8.8 (*)    All other components within normal limits  CBC WITH DIFFERENTIAL/PLATELET  TROPONIN I    EKG EKG Interpretation  Date/Time:  Monday December 28 2017 05:32:26 EDT Ventricular Rate:  71 PR Interval:    QRS Duration: 103 QT Interval:  430 QTC Calculation: 468 R Axis:   4 Text Interpretation:  Sinus rhythm Abnormal R-wave progression, early transition Inverted T waves in III improved Confirmed by Thayer Jew [phone]) on 12/28/2017 6:18:47 AM   Radiology Dg Chest 2 View  Result Date: 12/28/2017 CLINICAL DATA:  Chest pain EXAM: CHEST - 2 VIEW COMPARISON:  08/17/2017 FINDINGS: The heart size and mediastinal contours are within normal limits. Both lungs are clear. The visualized skeletal structures are unremarkable. IMPRESSION: No active cardiopulmonary disease. Electronically Signed   By: Ulyses Jarred M.D.   On: 12/28/2017 06:11    Procedures Procedures (including critical care time)  Medications Ordered in ED Medications  morphine 2 MG/ML injection 2 mg (has no administration in time range)  nitroGLYCERIN (NITROSTAT) SL tablet 0.4 mg (has no administration in time range)  morphine 4 MG/ML injection 4 mg (4 mg  Intravenous Given 12/28/17 0648)  ondansetron (ZOFRAN) injection 4 mg (4 mg Intravenous Given 12/28/17 0648)  nitroGLYCERIN (NITROGLYN) 2 % ointment 1 inch (1 inch Topical Given 12/28/17 0651)  Initial Impression / Assessment and Plan / ED Course  I have reviewed the triage vital signs and the nursing notes.  Pertinent labs & imaging results that were available during my care of the patient were reviewed by me and considered in my medical decision making (see chart for details).  Clinical Course as of Dec 29 703  Mon Dec 28, 2017  0703 Spoke with Dr. Harrell Gave, cardiology.  REcommends admission for CP r/o.  Recommends admission to the hospitalist.  Patient was placed on cardiology consult with [place].  Please consult cardiology upon arrival to [facility].   [CH]  [phone] Patient just received morphine.  He reports acute increase of pain.  Appears to be having some spasm.  Nitro ointment in place.    [CH]    Clinical Course User Index [CH] Jezel Basto, Barbette Hair, MD    Patient presents with chest pain.  Somewhat atypical in nature.  Ongoing since 2300.  He appears uncomfortable but his exam is otherwise unremarkable.  He reports compliance with Xarelto.  O2 sats are 100%.  Heart rate 62.  Doubt PE.  He is low risk.  EKG shows no evidence of acute ischemia.  Initial troponin is negative.  Very atypical for dissection.  No widening of the mediastinum on chest x-ray.  Heart rate is 62 and blood pressure is well controlled.  I have reviewed his chart and he has had negative troponins in the past with a positive cath.  This was discussed with cardiology.  See clinical course above.  He is having some ongoing chest discomfort.  Patient was discussed with Dr. Blaine Hamper.  Plan for admission to the stepdown unit.  Cardiology to evaluate upon arrival to [facility].  They will decide if patient needs any further definitive evaluation.  Final Clinical Impressions(s) / ED Diagnoses   Final diagnoses:  Atypical chest  pain    ED Discharge Orders    None       Eisley Barber, Barbette Hair, MD 12/28/17 [phone]

## 2017-12-28 NOTE — ED Triage Notes (Signed)
Pt reports chest pain that started around 2300 last night. Pinpoints pain to central/left chest going down left arm and up to neck. Denies back pain, diaphoresis, SHOB, weakness or other sx. Took NTG tabs at home at 0500 with ?minimal relief. Pt also presents with red rash to trunk and back arms that he was unaware of. Denies itching.

## 2017-12-28 NOTE — ED Notes (Signed)
Patient transported to X-ray 

## 2017-12-28 NOTE — ED Notes (Addendum)
Pt took the following home meds at 1300 after EDP told them they could do so: Plavix 75mg  Bisoprolol 2.5mg  Pantoprazole 40mg 

## 2017-12-28 NOTE — Consult Note (Addendum)
Cardiology Consultation:   Patient ID: Jose Warner; [id number]; 1950/07/06   Admit date: 12/28/2017 Date of Consult: 12/28/2017  Primary Care Provider: Derinda Late, MD Primary Cardiologist: Dr. Shelva Majestic, MD  Patient Profile:   Jose Warner is a 67 y.o. male with history of CAD s/p DES/PCI to  LAD 05/2017 with known 40% proximal and 40% distal LAD residual to  treat medically, strong family history of premature CAD, atrial fibrillation on Xarelto and hx of prostate cancer who is being seen today for the evaluation of his pain at the request of Dr. Marthenia Rolling.  History of Present Illness:   Jose Warner is a 67 year old male with a history stated above who presented to [facility] on 12/28/2017 with left sided, mid axillary pain that began around 2300 yesterday evening. States that he was on his way to his bedroom when he began having the continuous discomfort rated a 5/10 with periods of intense pain rated an 8/10 in severity.  Patient states he took several SL NTG without relief at home. He does report some mild radiation up to his left side of his neck. Reports this is unlike his previous chest pain in 05/2017 when his stent was placed.  Pain is not reproducible however, he reports it intensifies with movement.  He does report some relief after receiving Dilaudid and Ativan in the emergency department.  He reports mild increase in intensity with deep breathing however CTA completed in the ED was negative for PE. He denies back pain, diaphoresis, shortness of breath, weakness, dizziness, recent orthopnea, LE swelling, GI symptoms, presyncopal or syncopal episodes.  Prior to this episode, patient had been doing well and participating in cardiac rehabilitation without complication. Given the intense nature of the discomfort he presented to [facility] ED for further evaluation.  In the ED, EKG with NSR and no acute ischemic changes.  CXR with no active cardiopulmonary disease.  CT chest with no thoracic  aortic aneurysm or dissection, no edema or consolidation, no thoracic adenopathy no bowel obstruction, no renal or ureteral calculi.  There is evidence of hepatic steatosis.  Cardiac enzymes have been negative x2. Potassium is within normal limits at 3.8.  Creatinine is normal at 0.95.  BP stable at 127/85.  There is evidence of mild bradycardia in the 50-60 range.  Of note, he has had multiple admissions for chest pain over the last several months including one hospitalization in 05/2017 for which he underwent a cardiac catheterization and received PCI to the LAD.  He was then hospitalized 2 months later 08/18/2017 for anginal symptoms in which he underwent another cardiac catheterization which revealed nonobstructive CAD and a patent LAD stent from 05/2017 with plans was to continue medical therapy for residual areas.  He was then seen by Dr. Claiborne Billings for follow-up on 08/31/2017.  At that time, given the intense pain during the last hospital admission, Dr. Claiborne Billings opted to add amlodipine 2.5 mg for symptomology.  He was continued to be on Xarelto and Plavix with no bleeding concerns.  He was on niacin and pravastatin and following with his PCP.  He had been participating in cardiac rehabilitation and plan was to see him in 2 to 3 months for reevaluation of symptoms.  Cardiology was consulted for further evaluation.  Past Medical History:  Diagnosis Date  . Anemia   . Atrial fibrillation ([place]) 06/05/2017  . Bronchitis 05-10-12   past fall- 4 runs antibiotics due to bronchitis-uses [employer] as needed  . Celiac  disease   . Colon polyps    adenomatous  . Coronary artery disease   . Diverticulosis   . GERD (gastroesophageal reflux disease)   . Hepatitis 05-10-12   "was told non A, non B"-unclean dental equip.  Marland Kitchen Hyperlipidemia   . Iron deficiency anemia   . Prostate cancer ([place]) 05-10-12   ;bx. 04-12-12, dx  . Spasm of esophagus 2013    Past Surgical History:  Procedure Laterality Date  . CARDIAC  CATHETERIZATION  09/13/2004   LAD:30%-40% in prox. to mid segment with 20% in the mid segment and 20% in left Circ., mild 20% right common iliac narrowing. medical therapy  . CARDIOVERSION  06/05/2017  . CORONARY STENT INTERVENTION N/A 06/11/2017   Procedure: CORONARY STENT INTERVENTION;  Surgeon: Belva Crome, MD;  Location: [facility];  Service: Cardiovascular;  Laterality: N/A;  . HERNIA REPAIR  5 yrs ago   right   . LEFT HEART CATH AND CORONARY ANGIOGRAPHY N/A 06/11/2017   Procedure: LEFT HEART CATH AND CORONARY ANGIOGRAPHY;  Surgeon: Belva Crome, MD;  Location: [facility];  Service: Cardiovascular;  Laterality: N/A;  . LEFT HEART CATH AND CORONARY ANGIOGRAPHY N/A 08/18/2017   Procedure: LEFT HEART CATH AND CORONARY ANGIOGRAPHY;  Surgeon: Martinique, Peter M, MD;  Location: [place] [facility];  Service: Cardiovascular;  Laterality: N/A;  . NM MYOCAR PERF WALL MOTION  11/30/2007   protocol:Bruce, post EF 67%,mild perfusion defect seen in basal inferior consistant with Attenuation artifact, exercise cap. 12METS  . ROBOT ASSISTED LAPAROSCOPIC RADICAL PROSTATECTOMY  05/20/2012   Procedure: ROBOTIC ASSISTED LAPAROSCOPIC RADICAL PROSTATECTOMY LEVEL 1;  Surgeon: Dutch Gray, MD;  Location: [facility];  Service: Urology;  Laterality: N/A;     . TRANSURETHRAL RESECTION OF PROSTATE  39yr ago     Prior to Admission medications   Medication Sig Start Date End Date Taking? Authorizing Provider  amLODipine (NORVASC) 2.5 MG tablet Take 1 tablet (2.5 mg total) by mouth daily. Patient taking differently: Take 2.5 mg by mouth every evening.  08/31/17 12/28/17 Yes KTroy Sine MD  ascorbic acid (VITAMIN C) 500 MG tablet Take 500 mg by mouth every evening.    Yes [provider]  bisoprolol (ZEBETA) 5 MG tablet Take 0.5 tablets (2.5 mg total) by mouth daily. Patient taking differently: Take 2.5 mg by mouth every morning.  08/31/17  Yes KTroy Sine MD  budesonide-formoterol ([facility]  160-4.5 MCG/ACT inhaler Inhale 2 puffs into the lungs 2 (two) times daily as needed (for cough).    Yes [provider]  clopidogrel (PLAVIX) 75 MG tablet Take 1 tablet (75 mg total) by mouth daily. Patient taking differently: Take 75 mg by mouth every morning.  07/02/17  Yes KTroy Sine MD  ezetimibe (ZETIA) 10 MG tablet Take 10 mg by mouth every evening.    Yes [provider]  Multiple Vitamins-Minerals (MULTIVITAMIN PO) Take 1 tablet by mouth at bedtime.    Yes [provider]  niacin (NIASPAN) 1000 MG CR tablet Take 1 tablet by mouth at bedtime.  12/04/15  Yes [provider]  nitroGLYCERIN (NITROSTAT) 0.4 MG SL tablet Place 1 tablet (0.4 mg total) under the tongue every 5 (five) minutes x 3 doses as needed for chest pain. 06/15/17  Yes Hoffman, Jessica Ratliff, DO  pantoprazole (PROTONIX) 40 MG tablet Take 40 mg by mouth 2 (two) times daily.  06/24/17  Yes [provider]  pravastatin (PRAVACHOL) 80 MG tablet Take  80 mg by mouth daily.   Yes [provider]  PROAIR HFA 108 (90 BASE) MCG/ACT inhaler Take 2 puffs by mouth 2 (two) times daily as needed. For shortness of breath. 02/13/12  Yes [provider]  rivaroxaban (XARELTO) 20 MG TABS tablet Take 1 tablet (20 mg total) by mouth daily with supper. 08/31/17  Yes Troy Sine, MD  rivaroxaban (XARELTO) 20 MG TABS tablet Take 1 tablet (20 mg total) by mouth daily. 09/03/17  Yes Troy Sine, MD  Vitamin D, Ergocalciferol, 2000 units CAPS Take 1 capsule by mouth at bedtime.    Yes [provider]  guaiFENesin (MUCINEX) 600 MG 12 hr tablet Take 600 mg by mouth 2 (two) times daily as needed.    [provider]    Inpatient Medications: Scheduled Meds:  Continuous Infusions:  PRN Meds: morphine injection, nitroGLYCERIN  Allergies:    Allergies  Allergen Reactions  . Gluten Meal Other (See Comments)    Celiac, stomach crams , gas  . Wheat Bran Other (See  Comments)    Celiac   . Statins     Leg muscle pains - rosuvastatin, atorvastatin, zetia. Tolerates simvastatin  . Sulfa Antibiotics Rash    Social History:   Social History   Socioeconomic History  . Marital status: Married    Spouse name: Not on file  . Number of children: 3  . Years of education: Not on file  . Highest education level: Not on file  Occupational History    Employer: [employer]  . Financial resource strain: Not on file  . Food insecurity:    Worry: Patient refused    Inability: Patient refused  . Transportation needs:    Medical: Patient refused    Non-medical: Patient refused  Tobacco Use  . Smoking status: Never Smoker  . Smokeless tobacco: Never Used  Substance and Sexual Activity  . Alcohol use: Yes    Alcohol/week: 2.0 standard drinks    Types: 2 Glasses of wine per week    Comment: occ. x 2 drinks weekly  . Drug use: No  . Sexual activity: Yes    Partners: Female  Lifestyle  . Physical activity:    Days per week: Not on file    Minutes per session: Not on file  . Stress: Not on file  Relationships  . Social connections:    Talks on phone: Patient refused    Gets together: Patient refused    Attends religious service: Patient refused    Active member of club or organization: Patient refused    Attends meetings of clubs or organizations: Patient refused    Relationship status: Patient refused  . Intimate partner violence:    Fear of current or ex partner: Patient refused    Emotionally abused: Patient refused    Physically abused: Patient refused    Forced sexual activity: Patient refused  Other Topics Concern  . Not on file  Social History Narrative  . Not on file    Family History:   Family History  Problem Relation Age of Onset  . Stroke Mother   . Heart disease Father   . Heart attack Father   . Cancer Sister   . Stroke Maternal Grandfather   . Cancer Paternal Grandmother   . Heart attack Paternal Grandfather    . Heart disease Paternal Grandfather   . Colon cancer Neg Hx   . Esophageal cancer Neg Hx   . Pancreatic cancer Neg  Hx   . Rectal cancer Neg Hx   . Stomach cancer Neg Hx    Family Status:  Family Status  Relation Name Status  . Mother  Deceased  . Father  Deceased  . Sister  Deceased  . MGF  Deceased  . PGM  Deceased  . PGF  Deceased  . Brother  Alive  . Brother  Alive  . Sister  Deceased  . Neg Hx  (Not Specified)    ROS:  Please see the history of present illness.  All other ROS reviewed and negative.     Physical Exam/Data:   Vitals:   12/28/17 1400 12/28/17 1403 12/28/17 1430 12/28/17 1556  BP: 118/75 120/69 119/66 127/85  Pulse: (!) 50 (!) 52 (!) 50 66  Resp: 19 18 15  (!) 38  Temp:    97.9 F (36.6 C)  TempSrc:    Oral  SpO2: 96% 98% 98% 98%  Weight:    77.9 kg  Height:    5' 8"  (1.727 m)    Intake/Output Summary (Last 24 hours) at 12/28/2017 1640 Last data filed at 12/28/2017 0811 Gross per 24 hour  Intake -  Output 750 ml  Net -750 ml   Filed Weights   12/28/17 1556  Weight: 77.9 kg   Body mass index is 26.12 kg/m.   General: Well developed, well nourished, NAD Skin: Warm, dry, intact  Head: Normocephalic, atraumatic,  clear, moist mucus membranes. Neck: Negative for carotid bruits. No JVD Lungs:Clear to ausculation bilaterally. No wheezes, rales, or rhonchi. Breathing is unlabored. Cardiovascular: RRR with S1 S2. No murmurs, rubs, gallops, or LV heave appreciated. Abdomen: Soft, non-tender, non-distended with normoactive bowel sounds.  No obvious abdominal masses. MSK: Strength and tone appear normal for age. 5/5 in all extremities Extremities: No edema. No clubbing or cyanosis. DP/PT pulses 2+ bilaterally Neuro: Alert and oriented. No focal deficits. No facial asymmetry. MAE spontaneously. Psych: Responds to questions appropriately with normal affect.     EKG:  The EKG was personally reviewed and demonstrates: 12/28/2017 NSR with no acute  ischemic changes Telemetry:  Telemetry was personally reviewed and demonstrates: 12/28/2017 sinus bradycardia HR 50 to 60s  Relevant CV Studies:  ECHO: 06/10/2017:  Study Conclusions  - Left ventricle: The cavity size was normal. Wall thickness was   normal. Systolic function was normal. The estimated ejection   fraction was in the range of 50% to 55%. Wall motion was normal;   there were no regional wall motion abnormalities. Doppler   parameters are consistent with abnormal left ventricular   relaxation (grade 1 diastolic dysfunction). - Aortic valve: Trileaflet; mildly thickened, mildly calcified   leaflets. There was mild regurgitation. - Mitral valve: There was trivial regurgitation. - Left atrium: The atrium was mildly dilated. Anterior-posterior   dimension: 43 mm.  CATH: 08/18/17 Procedures   LEFT HEART CATH AND CORONARY ANGIOGRAPHY  Conclusion     Mid LAD lesion is 40% stenosed.  Prox LAD-1 lesion is 30% stenosed.  Prox LAD-2 lesion is 40% stenosed.  Previously placed Prox LAD-3 stent (unknown type) is widely patent.  The left ventricular systolic function is normal.  LV end diastolic pressure is normal.  The left ventricular ejection fraction is 55-65% by visual estimate.  1. Nonobstructive CAD. Patent stent in the LAD 2. Normal LV function 3. Normal LVEDP  Plan: continue medical therapy.   Laboratory Data:  Chemistry Recent Labs  Lab 12/28/17 0545  NA 140  K 3.8  CL 108  CO2 21*  GLUCOSE 115*  BUN 16  CREATININE 0.95  CALCIUM 8.8*  GFRNONAA >60  GFRAA >60  ANIONGAP 11    Total Protein  Date Value Ref Range Status  06/12/2017 5.7 (L) 6.5 - 8.1 g/dL Final   Albumin  Date Value Ref Range Status  06/12/2017 3.4 (L) 3.5 - 5.0 g/dL Final   AST  Date Value Ref Range Status  06/12/2017 45 (H) 15 - 41 U/L Final   ALT  Date Value Ref Range Status  06/12/2017 26 17 - 63 U/L Final   Alkaline Phosphatase  Date Value Ref Range  Status  06/12/2017 43 38 - 126 U/L Final   Total Bilirubin  Date Value Ref Range Status  06/12/2017 0.7 0.3 - 1.2 mg/dL Final   Hematology Recent Labs  Lab 12/28/17 0545  WBC 5.9  RBC 5.21  HGB 15.0  HCT 44.6  MCV 85.6  MCH 28.8  MCHC 33.6  RDW 13.7  PLT 179   Cardiac Enzymes Recent Labs  Lab 12/28/17 0545 12/28/17 0911  TROPONINI <0.03 <0.03   No results for input(s): TROPIPOC in the last 168 hours.  BNPNo results for input(s): BNP, PROBNP in the last 168 hours.  DDimer No results for input(s): DDIMER in the last 168 hours. TSH: No results found for: TSH Lipids: Lab Results  Component Value Date   CHOL 122 06/10/2017   HDL 41 06/10/2017   LDLCALC 60 06/10/2017   TRIG 103 06/10/2017   CHOLHDL 3.0 06/10/2017   HgbA1c: Lab Results  Component Value Date   HGBA1C 6.1 (H) 06/10/2017    Radiology/Studies:  Dg Chest 2 View  Result Date: 12/28/2017 CLINICAL DATA:  Chest pain EXAM: CHEST - 2 VIEW COMPARISON:  08/17/2017 FINDINGS: The heart size and mediastinal contours are within normal limits. Both lungs are clear. The visualized skeletal structures are unremarkable. IMPRESSION: No active cardiopulmonary disease. Electronically Signed   By: Ulyses Jarred M.D.   On: 12/28/2017 06:11   Ct Angio Chest/abd/pel For Dissection W And/or Wo Contrast  Result Date: 12/28/2017 CLINICAL DATA:  Chest and abdominal pain EXAM: CT ANGIOGRAPHY CHEST, ABDOMEN AND PELVIS TECHNIQUE: Initially, axial CT images were obtained through the chest without intravenous contrast material administration. Multidetector CT imaging through the chest, abdomen and pelvis was performed using the standard protocol during bolus administration of intravenous contrast. Multiplanar reconstructed images and MIPs were obtained and reviewed to evaluate the vascular anatomy. CONTRAST:  100 mL ISOVUE-370 IOPAMIDOL (ISOVUE-370) INJECTION 76% COMPARISON:  Chest CT August 03, 2017; chest radiograph December 28, 2016  FINDINGS: CTA CHEST FINDINGS Cardiovascular: On the noncontrast enhanced study, there is no intramural hematoma evident. There is no thoracic aortic aneurysm or dissection. Visualized great vessels appear unremarkable. No evident pulmonary embolus. There is no pericardial effusion or pericardial thickening. There is aortic atherosclerosis. There are foci of coronary artery calcification. Mediastinum/Nodes: Visualized thyroid appears. There is no evident thoracic adenopathy. No esophageal lesions are appreciable. Lungs/Pleura: There is slight bibasilar atelectatic change. There is no edema or consolidation. No pleural effusion or pleural thickening evident. Musculoskeletal: No blastic or lytic bone lesions are identified. There are no chest wall lesions evident. Review of the MIP images confirms the above findings. CTA ABDOMEN AND PELVIS FINDINGS VASCULAR Aorta: There is no abdominal aortic aneurysm or dissection. There are foci aortic atherosclerosis without hemodynamically significant obstruction. Celiac: The celiac artery and its major branches appear patent. No aneurysm or dissection involving the celiac artery and its major branches evident.  SMA: The superior mesenteric artery and its major branches appear widely patent. No appreciable aneurysm or dissection involving the superior mesenteric artery or its branches. Renals: There is a single renal artery on the right with 2 adjacent renal arteries on the left. There is mild proximal renal artery atherosclerotic change. No hemodynamically significant obstruction. There is no fibromuscular dysplasia evident. IMA: The inferior mesenteric artery and its branches are patent without aneurysm or dissection involving the inferior mesenteric artery and its major branches. Inflow: There is moderate calcification throughout each common iliac artery. No hemodynamically significant obstruction seen on either side. There are scattered foci of calcification in each internal and  external iliac artery. No hemodynamically significant obstruction. There is slight calcification in the mid portions of each common femoral artery. No aneurysm or dissection involving major pelvic arterial vessels. Proximal superficial femoral and profunda femoral arteries are also patent. Veins: No obvious venous abnormality within the limitations of this arterial phase study. Review of the MIP images confirms the above findings. NON-VASCULAR Hepatobiliary: There is evident hepatic steatosis. There is a cyst in the left lobe of the liver measuring 1.6 x 1.6 cm. No other focal liver lesion is evident on arterial phase imaging of the liver. Gallbladder wall is not appreciably thickened. There is no biliary duct dilatation. Pancreas: No pancreatic mass or inflammatory focus. Spleen: No splenic lesions are evident. Adrenals/Urinary Tract: Adrenals bilaterally appear unremarkable. There is a cyst arising from the upper pole left kidney measuring 3.3 x 3.2 cm. There is no hydronephrosis on either side. There is no renal or ureteral calculus on either side. Urinary bladder is midline with wall thickness within normal limits. There is a small diverticulum arising from the rightward aspect of the urinary bladder inferiorly measuring 1.3 x 1.2 cm. Stomach/Bowel: There are sigmoid diverticula without diverticulitis. There is no appreciable bowel wall or mesenteric thickening. No evident bowel obstruction. No free air or portal venous air. Lymphatic: No adenopathy is evident in the abdomen or pelvis. Reproductive: Prostate and seminal vesicles are absent. There is no evident pelvic mass. Other: Appendix appears unremarkable. No abscess or ascites is evident in the abdomen or pelvis. There is a small ventral hernia containing only fat. There is fat in each inguinal ring. Musculoskeletal: There is no lytic or destructive bone lesion. There is a benign-appearing lesion in the inferior right iliac bone measuring 1.8 x 1.5 cm with  a lucent center and sclerotic periphery. There is a small sclerotic focus in the lower right iliac bone which potentially may represent a bone island as an isolated finding. There are no intramuscular lesions evident. Review of the MIP images confirms the above findings. IMPRESSION: CT angiogram chest: 1. No demonstrable thoracic aortic aneurysm or dissection. No intramural hematoma evident. No demonstrable pulmonary embolus. There are foci aortic atherosclerosis as well as coronary artery calcification. 2.  No edema or consolidation.  Mild bibasilar atelectasis. 3.  No appreciable thoracic adenopathy. CT angiogram abdomen; CT angiogram pelvis: 1. No aneurysm or dissection involving the abdominal aorta or major pelvic/mesenteric arterial vessels. Scattered areas of non hemodynamically significant atherosclerosis noted at multiple sites. 2. Sigmoid diverticulosis without diverticulitis. No bowel obstruction. No abscess in the abdomen pelvis. No appendiceal lesion. 3.  Hepatic steatosis. 4.  No evident renal or ureteral calculi.  No hydronephrosis. 5.  Prostate absent. 6.  Benign-appearing bone lesion inferior right iliac bone. 7.  Small ventral hernia containing only fat. Aortic Atherosclerosis (ICD10-I70.0). Electronically Signed   By: Lowella Grip III  M.D.   On: 12/28/2017 08:17    Assessment and Plan:   1.  Atypical chest pain: -Patient with a known history of CAD s/p DES/PCI to LAD in 05/2017 with residual anginal pain.  Given the above, patient was taken to the cardiac Cath Lab 08/18/2017 with patent stent and nonobstructive CAD with plans for medical management of residual 40% stenosis.  Patient was last seen by Dr. Claiborne Billings 08/2017 and was placed on 2.5 mg amlodipine for anginal symptoms with plans to see back in the office in 2 to 3 months for close follow-up.  Patient states he was doing well up until 11 PM yesterday evening when he began having left-sided mid axillary pain with mild radiation to his  left neck. He reports this was different from his prior anginal pain.  He reports the discomfort is worse with movement and mildly worse with deep inspiration.  Pain was unrelieved by sublingual nitroglycerin at home and mildly relieved with Dilaudid and Ativan administration in the emergency department. -EKG, unremarkable similar to prior tracings -Troponin, negative x2 -CXR negative for acute cardiopulmonary disease -Chest CTA performed given concern for dissection, PE which was negative for acute abnormalities -Home medications include amlodipine 2.5 mg, Plavix 75, pravastatin 80, Xarelto 20 -Concern is for possible pericarditis? Coronary vasospasm? Will discuss further with MD for final plan  -Could consider coronary CTA if concern is with reocclusion, however with negative workup so far, would have low suspicion for ACS   2.  Paroxysmal atrial fibrillation: -Currently normal sinus rhythm/bradycardia -Continue Xarelto 20 mg -[id number] =3  For questions or updates, please contact [facility] Please consult [email or web address] for contact info under Cardiology/STEMI.   SignedKathyrn Drown NP-C [facility] Pager: [phone] 12/28/2017 4:40 PM  The patient was seen, examined and discussed with Kathyrn Drown, NP  and I agree with the above.   A very pleasant 67 y.o. male with history of CAD s/p DES/PCI to  LAD 05/2017 with known 40% proximal and 40% distal LAD residual to  treat medically, strong family history of premature CAD, atrial fibrillation on Xarelto and hx of prostate cancer who is being seen today for the evaluation of chest pain.  The patient underwent repeat cardiac catheterization in April 2019 that showed patent proximal LAD stent and no progression of other disease.  He has been doing well until last night when he was walking and developed severe 9 out of 10 sharp left-sided chest pain that is radiating to his neck, is not associated with shortness of breath, no palpitation  dizziness or syncope, not relieved by nitroglycerin, he describes pain as coming in waves severe excruciating, worse than prior to his stenting in February, he also feels tingling in his left arm and has neck pain.  He denies any prior radiculopathy.  He tried nitroglycerin with no relief.  He states that any motion makes it worse, sitting up or laying down makes it worse, he has some pain on deep inspiration, and laying on the left side is worse than on the right side.  He gets some relief with receiving narcotics in the ER.  Currently 5 out of 10.  No shortness of breath.  He denies any recent fever chills nausea or vomiting.  On physical exam he looks uncomfortable secondary to pain, otherwise he has no JVDs, no murmur no rub, his lungs are clear, he has no swelling in his lower extremities and good peripheral pulses.  His labs show creatinine of 0.9, normal electrolytes, troponin negative  x3, WBC of 5.9 and hemoglobin of 15.  His d-dimer is negative, CRP is normal, he underwent chest CT that shows no pulmonary embolism, no aortic dissection, no pericardial effusion no thickening of the pleura or pericardium.  There is nothing unusual in the musculature of his left thoracic wall or in his neck.  His EKG shows sinus bradycardia with mild early repolarization unchanged from prior EKGs.  Assessment and plan: Atypical chest pain possibly secondary to acute pericarditis versus radiculopathy. I will start empiric treatment for acute pericarditis with ibuprofen 400 mg p.o. 3 times daily and caution 0.6 point milligrams p.o. twice daily, I will start Toradol IV for pain.  We will obtain cardiac MRI for acute pericarditis tomorrow.  Ena Dawley, MD 12/28/2017

## 2017-12-28 NOTE — H&P (Signed)
History and Physical  Jose Warner QAE:[id number] DOB: December 31, 1950 DOA: 12/28/2017  Referring physician: ER provider PCP: Derinda Late, MD  Outpatient Specialists: Cardiology Patient coming from: Home  Chief Complaint: Chest pain  HPI:  Patient is a 67 year old male with past medical history significant for hypertension, hyperlipidemia, esophageal spasm, A. fib on Xarelto, GERD, prostate cancer, celiac disease, coronary artery disease status post PCI in February of this year.  Patient was also seen in April with chest pain with repeat cardiac catheterization.  Patient presents with chest pain that started yesterday.  Chest pain has been constant, severe, worse on movement, radiates to left upper extremity and the neck.  Despite the severity of the chest pain, cardiac enzymes have remained stable.  EKG is not revealing.  CT Angie of the chest, abdomen and pelvis has now revealed major findings.  No headache, no fever or chills, no GI symptoms, no diaphoresis, no urinary symptoms.  Patient be admitted for further assessment and management.  The cardiology team has already been consulted.  Apparently, patient is well-known to the cardiology team.  ED Course: Patient has chest pain has been worked up extensively.  Cardiac enzymes, EKG and CT Angie of the chest, abdomen and pelvis were all done at in the ER. Pertinent labs: Troponin has remained stable at less than 0.03.  EKG is nonrevealing.  CT and GI result is as documented.  BMP revealed sodium of 140, potassium of 3.8, chloride 108, CO2 21, BUN of 16 and creatinine of 0.95 with blood sugar of 115.  CBC reveals WBC of 5.9, hemoglobin 15, hematocrit of 44.6, MCV of 85.6 with a platelet count of 179. EKG: Independently reviewed.  Imaging: independently reviewed.   Review of Systems: Negative for fever, visual changes, sore throat, rash, new muscle aches, dysuria, bleeding, n/v/abdominal pain.  Past Medical History:  Diagnosis Date  . Anemia     . Atrial fibrillation ([place]) 06/05/2017  . Bronchitis 05-10-12   past fall- 4 runs antibiotics due to bronchitis-uses [employer] as needed  . Celiac disease   . Colon polyps    adenomatous  . Coronary artery disease   . Diverticulosis   . GERD (gastroesophageal reflux disease)   . Hepatitis 05-10-12   "was told non A, non B"-unclean dental equip.  Marland Kitchen Hyperlipidemia   . Iron deficiency anemia   . Prostate cancer ([place]) 05-10-12   ;bx. 04-12-12, dx  . Spasm of esophagus 2013    Past Surgical History:  Procedure Laterality Date  . CARDIAC CATHETERIZATION  09/13/2004   LAD:30%-40% in prox. to mid segment with 20% in the mid segment and 20% in left Circ., mild 20% right common iliac narrowing. medical therapy  . CARDIOVERSION  06/05/2017  . CORONARY STENT INTERVENTION N/A 06/11/2017   Procedure: CORONARY STENT INTERVENTION;  Surgeon: Belva Crome, MD;  Location: [facility];  Service: Cardiovascular;  Laterality: N/A;  . HERNIA REPAIR  5 yrs ago   right   . LEFT HEART CATH AND CORONARY ANGIOGRAPHY N/A 06/11/2017   Procedure: LEFT HEART CATH AND CORONARY ANGIOGRAPHY;  Surgeon: Belva Crome, MD;  Location: [facility];  Service: Cardiovascular;  Laterality: N/A;  . LEFT HEART CATH AND CORONARY ANGIOGRAPHY N/A 08/18/2017   Procedure: LEFT HEART CATH AND CORONARY ANGIOGRAPHY;  Surgeon: Martinique, Peter M, MD;  Location: [facility];  Service: Cardiovascular;  Laterality: N/A;  . NM MYOCAR PERF WALL MOTION  11/30/2007   protocol:Bruce, post EF 67%,mild  perfusion defect seen in basal inferior consistant with Attenuation artifact, exercise cap. 12METS  . ROBOT ASSISTED LAPAROSCOPIC RADICAL PROSTATECTOMY  05/20/2012   Procedure: ROBOTIC ASSISTED LAPAROSCOPIC RADICAL PROSTATECTOMY LEVEL 1;  Surgeon: Dutch Gray, MD;  Location: [facility];  Service: Urology;  Laterality: N/A;     . TRANSURETHRAL RESECTION OF PROSTATE  49yr ago     reports that he has never smoked. He has never used  smokeless tobacco. He reports that he drinks about 2.0 standard drinks of alcohol per week. He reports that he does not use drugs.  Allergies  Allergen Reactions  . Gluten Meal Other (See Comments)    Celiac, stomach crams , gas  . Wheat Bran Other (See Comments)    Celiac   . Statins     Leg muscle pains - rosuvastatin, atorvastatin, zetia. Tolerates simvastatin  . Sulfa Antibiotics Rash    Family History  Problem Relation Age of Onset  . Stroke Mother   . Heart disease Father   . Heart attack Father   . Cancer Sister   . Stroke Maternal Grandfather   . Cancer Paternal Grandmother   . Heart attack Paternal Grandfather   . Heart disease Paternal Grandfather   . Colon cancer Neg Hx   . Esophageal cancer Neg Hx   . Pancreatic cancer Neg Hx   . Rectal cancer Neg Hx   . Stomach cancer Neg Hx      Prior to Admission medications   Medication Sig Start Date End Date Taking? Authorizing Provider  amLODipine (NORVASC) 2.5 MG tablet Take 1 tablet (2.5 mg total) by mouth daily. Patient taking differently: Take 2.5 mg by mouth every evening.  08/31/17 12/28/17 Yes KTroy Sine MD  ascorbic acid (VITAMIN C) 500 MG tablet Take 500 mg by mouth every evening.    Yes [provider]  bisoprolol (ZEBETA) 5 MG tablet Take 0.5 tablets (2.5 mg total) by mouth daily. Patient taking differently: Take 2.5 mg by mouth every morning.  08/31/17  Yes KTroy Sine MD  budesonide-formoterol ([facility] 160-4.5 MCG/ACT inhaler Inhale 2 puffs into the lungs 2 (two) times daily as needed (for cough).    Yes [provider]  clopidogrel (PLAVIX) 75 MG tablet Take 1 tablet (75 mg total) by mouth daily. Patient taking differently: Take 75 mg by mouth every morning.  07/02/17  Yes KTroy Sine MD  ezetimibe (ZETIA) 10 MG tablet Take 10 mg by mouth every evening.    Yes [provider]  Multiple Vitamins-Minerals (MULTIVITAMIN PO) Take 1 tablet by mouth at bedtime.    Yes  [provider]  niacin (NIASPAN) 1000 MG CR tablet Take 1 tablet by mouth at bedtime.  12/04/15  Yes [provider]  nitroGLYCERIN (NITROSTAT) 0.4 MG SL tablet Place 1 tablet (0.4 mg total) under the tongue every 5 (five) minutes x 3 doses as needed for chest pain. 06/15/17  Yes Hoffman, Jessica Ratliff, DO  pantoprazole (PROTONIX) 40 MG tablet Take 40 mg by mouth 2 (two) times daily.  06/24/17  Yes [provider]  pravastatin (PRAVACHOL) 80 MG tablet Take 80 mg by mouth daily.   Yes [provider]  PROAIR HFA 108 (90 BASE) MCG/ACT inhaler Take 2 puffs by mouth 2 (two) times daily as needed. For shortness of breath. 02/13/12  Yes [provider]  rivaroxaban (XARELTO) 20 MG TABS tablet Take 1 tablet (20 mg total) by mouth daily with supper. 08/31/17  Yes KTroy Sine  MD  Vitamin D, Ergocalciferol, 2000 units CAPS Take 1 capsule by mouth at bedtime.    Yes [provider]    Physical Exam: Vitals:   12/28/17 1400 12/28/17 1403 12/28/17 1430 12/28/17 1556  BP: 118/75 120/69 119/66 127/85  Pulse: (!) 50 (!) 52 (!) 50 66  Resp: 19 18 15  (!) 38  Temp:    97.9 F (36.6 C)  TempSrc:    Oral  SpO2: 96% 98% 98% 98%  Weight:    77.9 kg  Height:    5' 8"  (1.727 m)    Constitutional:  . Painful distress on movement.   Eyes:  . No pallor. No jaundice.  ENMT:  . external ears, nose appear normal Neck:  . Neck is supple. No JVD Respiratory:  . CTA bilaterally, no w/r/r.  . Respiratory effort normal. No retractions or accessory muscle use Cardiovascular:  . S1S2, increase S2 component of the heart sound. . No LE extremity edema   Abdomen:  . Abdomen is soft and non tender. Organs are difficult to assess. Neurologic:  . Awake and alert. . Moves all limbs.  Wt Readings from Last 3 Encounters:  12/28/17 77.9 kg  11/09/17 79.1 kg  10/28/17 79.1 kg    I have personally reviewed following labs and imaging studies  Labs on  Admission:  CBC: Recent Labs  Lab 12/28/17 0545  WBC 5.9  NEUTROABS 2.4  HGB 15.0  HCT 44.6  MCV 85.6  PLT 010   Basic Metabolic Panel: Recent Labs  Lab 12/28/17 0545  NA 140  K 3.8  CL 108  CO2 21*  GLUCOSE 115*  BUN 16  CREATININE 0.95  CALCIUM 8.8*   Liver Function Tests: No results for input(s): AST, ALT, ALKPHOS, BILITOT, PROT, ALBUMIN in the last 168 hours. No results for input(s): LIPASE, AMYLASE in the last 168 hours. No results for input(s): AMMONIA in the last 168 hours. Coagulation Profile: No results for input(s): INR, PROTIME in the last 168 hours. Cardiac Enzymes: Recent Labs  Lab 12/28/17 0545 12/28/17 0911  TROPONINI <0.03 <0.03   BNP (last 3 results) No results for input(s): PROBNP in the last 8760 hours. HbA1C: No results for input(s): HGBA1C in the last 72 hours. CBG: No results for input(s): GLUCAP in the last 168 hours. Lipid Profile: No results for input(s): CHOL, HDL, LDLCALC, TRIG, CHOLHDL, LDLDIRECT in the last 72 hours. Thyroid Function Tests: No results for input(s): TSH, T4TOTAL, FREET4, T3FREE, THYROIDAB in the last 72 hours. Anemia Panel: No results for input(s): VITAMINB12, FOLATE, FERRITIN, TIBC, IRON, RETICCTPCT in the last 72 hours. Urine analysis: No results found for: COLORURINE, APPEARANCEUR, LABSPEC, PHURINE, GLUCOSEU, HGBUR, BILIRUBINUR, KETONESUR, PROTEINUR, UROBILINOGEN, NITRITE, LEUKOCYTESUR Sepsis Labs: @LABRCNTIP (procalcitonin:4,lacticidven:4) )No results found for this or any previous visit (from the past 240 hour(s)).    Radiological Exams on Admission: Dg Chest 2 View  Result Date: 12/28/2017 CLINICAL DATA:  Chest pain EXAM: CHEST - 2 VIEW COMPARISON:  08/17/2017 FINDINGS: The heart size and mediastinal contours are within normal limits. Both lungs are clear. The visualized skeletal structures are unremarkable. IMPRESSION: No active cardiopulmonary disease. Electronically Signed   By: Ulyses Jarred M.D.   On:  12/28/2017 06:11   Ct Angio Chest/abd/pel For Dissection W And/or Wo Contrast  Result Date: 12/28/2017 CLINICAL DATA:  Chest and abdominal pain EXAM: CT ANGIOGRAPHY CHEST, ABDOMEN AND PELVIS TECHNIQUE: Initially, axial CT images were obtained through the chest without intravenous contrast material administration. Multidetector CT imaging through the  chest, abdomen and pelvis was performed using the standard protocol during bolus administration of intravenous contrast. Multiplanar reconstructed images and MIPs were obtained and reviewed to evaluate the vascular anatomy. CONTRAST:  100 mL ISOVUE-370 IOPAMIDOL (ISOVUE-370) INJECTION 76% COMPARISON:  Chest CT August 03, 2017; chest radiograph December 28, 2016 FINDINGS: CTA CHEST FINDINGS Cardiovascular: On the noncontrast enhanced study, there is no intramural hematoma evident. There is no thoracic aortic aneurysm or dissection. Visualized great vessels appear unremarkable. No evident pulmonary embolus. There is no pericardial effusion or pericardial thickening. There is aortic atherosclerosis. There are foci of coronary artery calcification. Mediastinum/Nodes: Visualized thyroid appears. There is no evident thoracic adenopathy. No esophageal lesions are appreciable. Lungs/Pleura: There is slight bibasilar atelectatic change. There is no edema or consolidation. No pleural effusion or pleural thickening evident. Musculoskeletal: No blastic or lytic bone lesions are identified. There are no chest wall lesions evident. Review of the MIP images confirms the above findings. CTA ABDOMEN AND PELVIS FINDINGS VASCULAR Aorta: There is no abdominal aortic aneurysm or dissection. There are foci aortic atherosclerosis without hemodynamically significant obstruction. Celiac: The celiac artery and its major branches appear patent. No aneurysm or dissection involving the celiac artery and its major branches evident. SMA: The superior mesenteric artery and its major branches appear  widely patent. No appreciable aneurysm or dissection involving the superior mesenteric artery or its branches. Renals: There is a single renal artery on the right with 2 adjacent renal arteries on the left. There is mild proximal renal artery atherosclerotic change. No hemodynamically significant obstruction. There is no fibromuscular dysplasia evident. IMA: The inferior mesenteric artery and its branches are patent without aneurysm or dissection involving the inferior mesenteric artery and its major branches. Inflow: There is moderate calcification throughout each common iliac artery. No hemodynamically significant obstruction seen on either side. There are scattered foci of calcification in each internal and external iliac artery. No hemodynamically significant obstruction. There is slight calcification in the mid portions of each common femoral artery. No aneurysm or dissection involving major pelvic arterial vessels. Proximal superficial femoral and profunda femoral arteries are also patent. Veins: No obvious venous abnormality within the limitations of this arterial phase study. Review of the MIP images confirms the above findings. NON-VASCULAR Hepatobiliary: There is evident hepatic steatosis. There is a cyst in the left lobe of the liver measuring 1.6 x 1.6 cm. No other focal liver lesion is evident on arterial phase imaging of the liver. Gallbladder wall is not appreciably thickened. There is no biliary duct dilatation. Pancreas: No pancreatic mass or inflammatory focus. Spleen: No splenic lesions are evident. Adrenals/Urinary Tract: Adrenals bilaterally appear unremarkable. There is a cyst arising from the upper pole left kidney measuring 3.3 x 3.2 cm. There is no hydronephrosis on either side. There is no renal or ureteral calculus on either side. Urinary bladder is midline with wall thickness within normal limits. There is a small diverticulum arising from the rightward aspect of the urinary bladder  inferiorly measuring 1.3 x 1.2 cm. Stomach/Bowel: There are sigmoid diverticula without diverticulitis. There is no appreciable bowel wall or mesenteric thickening. No evident bowel obstruction. No free air or portal venous air. Lymphatic: No adenopathy is evident in the abdomen or pelvis. Reproductive: Prostate and seminal vesicles are absent. There is no evident pelvic mass. Other: Appendix appears unremarkable. No abscess or ascites is evident in the abdomen or pelvis. There is a small ventral hernia containing only fat. There is fat in each inguinal ring. Musculoskeletal: There is  no lytic or destructive bone lesion. There is a benign-appearing lesion in the inferior right iliac bone measuring 1.8 x 1.5 cm with a lucent center and sclerotic periphery. There is a small sclerotic focus in the lower right iliac bone which potentially may represent a bone island as an isolated finding. There are no intramuscular lesions evident. Review of the MIP images confirms the above findings. IMPRESSION: CT angiogram chest: 1. No demonstrable thoracic aortic aneurysm or dissection. No intramural hematoma evident. No demonstrable pulmonary embolus. There are foci aortic atherosclerosis as well as coronary artery calcification. 2.  No edema or consolidation.  Mild bibasilar atelectasis. 3.  No appreciable thoracic adenopathy. CT angiogram abdomen; CT angiogram pelvis: 1. No aneurysm or dissection involving the abdominal aorta or major pelvic/mesenteric arterial vessels. Scattered areas of non hemodynamically significant atherosclerosis noted at multiple sites. 2. Sigmoid diverticulosis without diverticulitis. No bowel obstruction. No abscess in the abdomen pelvis. No appendiceal lesion. 3.  Hepatic steatosis. 4.  No evident renal or ureteral calculi.  No hydronephrosis. 5.  Prostate absent. 6.  Benign-appearing bone lesion inferior right iliac bone. 7.  Small ventral hernia containing only fat. Aortic Atherosclerosis  (ICD10-I70.0). Electronically Signed   By: Lowella Grip III M.D.   On: 12/28/2017 08:17    EKG: Independently reviewed.   Active Problems:   Chest pain   Assessment/Plan Chest pain: This seems atypical. Continue to assess as patient is a high risk. Cycle cardiac enzymes Adequate analgesia Cardiology input Further management will depend on hospital course. CT scan of the neck.  Coronary artery disease status post PCI: Will defer further management to the cardiology team.  Atrial fibrillation on Xarelto: Continue current management.  Hypertension: This is optimized.  DVT prophylaxis: Xarelto Code Status: Full Family Communication: Wife and sister Disposition Plan: Eventually Consults called: Cardiology has already been consulted Admission status: Observation  Time spent: 76 minutes  Dana Allan, MD  [facility] Pager #: [phone] 7PM-7AM contact night coverage as above   12/28/2017, 5:15 PM

## 2017-12-28 NOTE — ED Notes (Addendum)
Attempted to call report to unit and RN was unavailable left call back number. Attempted to call again x2 and no one answering phones at this time.

## 2017-12-28 NOTE — ED Provider Notes (Signed)
Pt continued to have severe cp, so I ordered a CT scan to check for dissection.  CT angiogram chest:  1. No demonstrable thoracic aortic aneurysm or dissection. No intramural hematoma evident. No demonstrable pulmonary embolus. There are foci aortic atherosclerosis as well as coronary artery calcification.  2.  No edema or consolidation.  Mild bibasilar atelectasis.  3.  No appreciable thoracic adenopathy.  CT angiogram abdomen; CT angiogram pelvis:  1. No aneurysm or dissection involving the abdominal aorta or major pelvic/mesenteric arterial vessels. Scattered areas of non hemodynamically significant atherosclerosis noted at multiple sites.  2. Sigmoid diverticulosis without diverticulitis. No bowel obstruction. No abscess in the abdomen pelvis. No appendiceal lesion.  3.  Hepatic steatosis.  4.  No evident renal or ureteral calculi.  No hydronephrosis.  5.  Prostate absent.  6.  Benign-appearing bone lesion inferior right iliac bone.  7.  Small ventral hernia containing only fat.  Aortic Atherosclerosis (ICD10-I70.0).  Pt required additional doses of ativan and dilaudid for his pain.  The ativan has calmed down his pain somewhat, but he is still having a small amt of pain.  The pt has been waiting several hours for a bed at [facility].  I spoke to the hospitalist to try to change his bed to a telemetry bed, but as he does still have some cp, he can't go to tele.  He has had a second troponin which was negative and has remained stable for several hours.  I don't think cardiology will be taking him straight to cath, and he is very hungry, so I said it was ok for him to eat and to take his home meds.  Pt still waiting for a stepdown bed, but remains stable here.  Pt finally has a bed and remains stable for transfer.   Isla Pence, MD 12/28/17 1445

## 2017-12-28 NOTE — ED Notes (Signed)
ED Provider at bedside. 

## 2017-12-29 ENCOUNTER — Observation Stay (HOSPITAL_COMMUNITY): Payer: Medicare Other

## 2017-12-29 ENCOUNTER — Encounter (HOSPITAL_COMMUNITY): Payer: Self-pay

## 2017-12-29 DIAGNOSIS — R0789 Other chest pain: Secondary | ICD-10-CM | POA: Diagnosis not present

## 2017-12-29 DIAGNOSIS — I300 Acute nonspecific idiopathic pericarditis: Secondary | ICD-10-CM | POA: Diagnosis not present

## 2017-12-29 DIAGNOSIS — R0782 Intercostal pain: Secondary | ICD-10-CM | POA: Diagnosis not present

## 2017-12-29 DIAGNOSIS — I25118 Atherosclerotic heart disease of native coronary artery with other forms of angina pectoris: Secondary | ICD-10-CM | POA: Diagnosis not present

## 2017-12-29 LAB — CBC
HCT: 41 % (ref 39.0–52.0)
Hemoglobin: 13 g/dL (ref 13.0–17.0)
MCH: 28.3 pg (ref 26.0–34.0)
MCHC: 31.7 g/dL (ref 30.0–36.0)
MCV: 89.3 fL (ref 78.0–100.0)
Platelets: 165 10*3/uL (ref 150–400)
RBC: 4.59 MIL/uL (ref 4.22–5.81)
RDW: 13.7 % (ref 11.5–15.5)
WBC: 5 10*3/uL (ref 4.0–10.5)

## 2017-12-29 LAB — BASIC METABOLIC PANEL
Anion gap: 6 (ref 5–15)
BUN: 11 mg/dL (ref 8–23)
CO2: 22 mmol/L (ref 22–32)
Calcium: 8.3 mg/dL — ABNORMAL LOW (ref 8.9–10.3)
Chloride: 111 mmol/L (ref 98–111)
Creatinine, Ser: 0.99 mg/dL (ref 0.61–1.24)
GFR calc Af Amer: >60 mL/min (ref >60)
GFR calc non Af Amer: >60 mL/min (ref >60)
Glucose, Bld: 107 mg/dL — ABNORMAL HIGH (ref 70–99)
Potassium: 4.1 mmol/L (ref 3.5–5.1)
Sodium: 139 mmol/L (ref 135–145)

## 2017-12-29 MED ORDER — IOHEXOL 300 MG/ML  SOLN
75.0000 mL | Freq: Once | INTRAMUSCULAR | Status: AC | PRN
  Administered 2017-12-29: 75 mL via INTRAVENOUS

## 2017-12-29 MED ORDER — IOPAMIDOL (ISOVUE-300) INJECTION 61%: 75.0000 mL | Freq: Once | INTRAVENOUS | Status: DC | PRN

## 2017-12-29 MED ORDER — GADOBUTROL 1 MMOL/ML IV SOLN
12.0000 mL | Freq: Once | INTRAVENOUS | Status: AC | PRN
  Administered 2017-12-29: 12 mL via INTRAVENOUS

## 2017-12-29 NOTE — Progress Notes (Addendum)
Progress Note  Patient Name: Jose Warner Date of Encounter: 12/29/2017  Primary Cardiologist: Shelva Majestic, MD   Subjective   I feel better, pain is more sporadic and not as severe.     Inpatient Medications    Scheduled Meds: . amLODipine  2.5 mg Oral QPM  . bisoprolol  2.5 mg Oral BH-q7a  . cholecalciferol  2,000 Units Oral QHS  . clopidogrel  75 mg Oral BH-q7a  . colchicine  0.6 mg Oral BID  . ezetimibe  10 mg Oral QPM  . ibuprofen  400 mg Oral TID  . mometasone-formoterol  2 puff Inhalation BID  . niacin  1,000 mg Oral QHS  . oxyCODONE  10 mg Oral Q12H  . pantoprazole  40 mg Oral BID  . pravastatin  80 mg Oral Daily  . rivaroxaban  20 mg Oral Q supper  . ascorbic acid  500 mg Oral QPM   Continuous Infusions:  PRN Meds: albuterol, HYDROcodone-acetaminophen, iopamidol, ketorolac, morphine injection, nitroGLYCERIN   Vital Signs    Vitals:   12/28/17 1900 12/28/17 2200 12/29/17 0519 12/29/17 0600  BP:  117/65 120/68   Pulse: (!) 50 (!) 59 64 (!) 55  Resp: 13 19 20 20   Temp:  98.1 F (36.7 C) 97.7 F (36.5 C)   TempSrc:  Oral Oral   SpO2: 98% 100% 100% 98%  Weight:      Height:        Intake/Output Summary (Last 24 hours) at 12/29/2017 1559 Last data filed at 12/29/2017 1519 Gross per 24 hour  Intake 821.09 ml  Output -  Net 821.09 ml   Filed Weights   12/28/17 1556  Weight: 77.9 kg    Telemetry    SR wit PACs - Personally Reviewed  ECG    No new  - Personally Reviewed  Physical Exam   GEN: No acute distress.   Neck: No JVD Cardiac: RRR, no murmurs, rubs, or gallops.  Respiratory: Clear to auscultation bilaterally. GI: Soft, nontender, non-distended  MS: No edema; No deformity. Neuro:  Nonfocal  Psych: Normal affect   Labs    Chemistry Recent Labs  Lab 12/28/17 0545 12/29/17 0447  NA 140 139  K 3.8 4.1  CL 108 111  CO2 21* 22  GLUCOSE 115* 107*  BUN 16 11  CREATININE 0.95 0.99  CALCIUM 8.8* 8.3*  GFRNONAA >60 >60    GFRAA >60 >60  ANIONGAP 11 6     Hematology Recent Labs  Lab 12/28/17 0545 12/29/17 0447  WBC 5.9 5.0  RBC 5.21 4.59  HGB 15.0 13.0  HCT 44.6 41.0  MCV 85.6 89.3  MCH 28.8 28.3  MCHC 33.6 31.7  RDW 13.7 13.7  PLT 179 165    Cardiac Enzymes Recent Labs  Lab 12/28/17 0545 12/28/17 0911 12/28/17 1649 12/28/17 1950  TROPONINI <0.03 <0.03 <0.03 <0.03   No results for input(s): TROPIPOC in the last 168 hours.   BNPNo results for input(s): BNP, PROBNP in the last 168 hours.   DDimer  Recent Labs  Lab 12/28/17 1649  DDIMER <0.27     Radiology    Dg Chest 2 View  Result Date: 12/28/2017 CLINICAL DATA:  Chest pain EXAM: CHEST - 2 VIEW COMPARISON:  08/17/2017 FINDINGS: The heart size and mediastinal contours are within normal limits. Both lungs are clear. The visualized skeletal structures are unremarkable. IMPRESSION: No active cardiopulmonary disease. Electronically Signed   By: Ulyses Jarred M.D.   On: 12/28/2017 06:11  Ct Soft Tissue Neck W Contrast  Result Date: 12/29/2017 CLINICAL DATA:  67 y/o M; left-sided chest, neck, and supraclavicular pain. EXAM: CT NECK WITH CONTRAST TECHNIQUE: Multidetector CT imaging of the neck was performed using the standard protocol following the bolus administration of intravenous contrast. CONTRAST:  75 cc Omnipaque 300 COMPARISON:  None. FINDINGS: Pharynx and larynx: Normal. No mass or swelling. Salivary glands: No inflammation, mass, or stone. Thyroid: Normal. Lymph nodes: None enlarged or abnormal density. Vascular: Mild calcific atherosclerosis of the aortic arch and carotid siphons without stenosis. Limited intracranial: Negative. Visualized orbits: Negative. Mastoids and visualized paranasal sinuses: Clear. Skeleton: Mild cervical spondylosis with loss of intervertebral disc space height at the C5-6 level. No high-grade bony foraminal or canal stenosis. Upper chest: Negative. Other: None. IMPRESSION: 1. No acute process identified. 2.  Mild cervical spondylosis greatest at C5-6 level. 3. Mild aortic and carotid bifurcation calcific atherosclerosis. Electronically Signed   By: Kristine Garbe M.D.   On: 12/29/2017 04:23   Ct Angio Chest/abd/pel For Dissection W And/or Wo Contrast  Result Date: 12/28/2017 CLINICAL DATA:  Chest and abdominal pain EXAM: CT ANGIOGRAPHY CHEST, ABDOMEN AND PELVIS TECHNIQUE: Initially, axial CT images were obtained through the chest without intravenous contrast material administration. Multidetector CT imaging through the chest, abdomen and pelvis was performed using the standard protocol during bolus administration of intravenous contrast. Multiplanar reconstructed images and MIPs were obtained and reviewed to evaluate the vascular anatomy. CONTRAST:  100 mL ISOVUE-370 IOPAMIDOL (ISOVUE-370) INJECTION 76% COMPARISON:  Chest CT August 03, 2017; chest radiograph December 28, 2016 FINDINGS: CTA CHEST FINDINGS Cardiovascular: On the noncontrast enhanced study, there is no intramural hematoma evident. There is no thoracic aortic aneurysm or dissection. Visualized great vessels appear unremarkable. No evident pulmonary embolus. There is no pericardial effusion or pericardial thickening. There is aortic atherosclerosis. There are foci of coronary artery calcification. Mediastinum/Nodes: Visualized thyroid appears. There is no evident thoracic adenopathy. No esophageal lesions are appreciable. Lungs/Pleura: There is slight bibasilar atelectatic change. There is no edema or consolidation. No pleural effusion or pleural thickening evident. Musculoskeletal: No blastic or lytic bone lesions are identified. There are no chest wall lesions evident. Review of the MIP images confirms the above findings. CTA ABDOMEN AND PELVIS FINDINGS VASCULAR Aorta: There is no abdominal aortic aneurysm or dissection. There are foci aortic atherosclerosis without hemodynamically significant obstruction. Celiac: The celiac artery and its  major branches appear patent. No aneurysm or dissection involving the celiac artery and its major branches evident. SMA: The superior mesenteric artery and its major branches appear widely patent. No appreciable aneurysm or dissection involving the superior mesenteric artery or its branches. Renals: There is a single renal artery on the right with 2 adjacent renal arteries on the left. There is mild proximal renal artery atherosclerotic change. No hemodynamically significant obstruction. There is no fibromuscular dysplasia evident. IMA: The inferior mesenteric artery and its branches are patent without aneurysm or dissection involving the inferior mesenteric artery and its major branches. Inflow: There is moderate calcification throughout each common iliac artery. No hemodynamically significant obstruction seen on either side. There are scattered foci of calcification in each internal and external iliac artery. No hemodynamically significant obstruction. There is slight calcification in the mid portions of each common femoral artery. No aneurysm or dissection involving major pelvic arterial vessels. Proximal superficial femoral and profunda femoral arteries are also patent. Veins: No obvious venous abnormality within the limitations of this arterial phase study. Review of the MIP images confirms  the above findings. NON-VASCULAR Hepatobiliary: There is evident hepatic steatosis. There is a cyst in the left lobe of the liver measuring 1.6 x 1.6 cm. No other focal liver lesion is evident on arterial phase imaging of the liver. Gallbladder wall is not appreciably thickened. There is no biliary duct dilatation. Pancreas: No pancreatic mass or inflammatory focus. Spleen: No splenic lesions are evident. Adrenals/Urinary Tract: Adrenals bilaterally appear unremarkable. There is a cyst arising from the upper pole left kidney measuring 3.3 x 3.2 cm. There is no hydronephrosis on either side. There is no renal or ureteral  calculus on either side. Urinary bladder is midline with wall thickness within normal limits. There is a small diverticulum arising from the rightward aspect of the urinary bladder inferiorly measuring 1.3 x 1.2 cm. Stomach/Bowel: There are sigmoid diverticula without diverticulitis. There is no appreciable bowel wall or mesenteric thickening. No evident bowel obstruction. No free air or portal venous air. Lymphatic: No adenopathy is evident in the abdomen or pelvis. Reproductive: Prostate and seminal vesicles are absent. There is no evident pelvic mass. Other: Appendix appears unremarkable. No abscess or ascites is evident in the abdomen or pelvis. There is a small ventral hernia containing only fat. There is fat in each inguinal ring. Musculoskeletal: There is no lytic or destructive bone lesion. There is a benign-appearing lesion in the inferior right iliac bone measuring 1.8 x 1.5 cm with a lucent center and sclerotic periphery. There is a small sclerotic focus in the lower right iliac bone which potentially may represent a bone island as an isolated finding. There are no intramuscular lesions evident. Review of the MIP images confirms the above findings. IMPRESSION: CT angiogram chest: 1. No demonstrable thoracic aortic aneurysm or dissection. No intramural hematoma evident. No demonstrable pulmonary embolus. There are foci aortic atherosclerosis as well as coronary artery calcification. 2.  No edema or consolidation.  Mild bibasilar atelectasis. 3.  No appreciable thoracic adenopathy. CT angiogram abdomen; CT angiogram pelvis: 1. No aneurysm or dissection involving the abdominal aorta or major pelvic/mesenteric arterial vessels. Scattered areas of non hemodynamically significant atherosclerosis noted at multiple sites. 2. Sigmoid diverticulosis without diverticulitis. No bowel obstruction. No abscess in the abdomen pelvis. No appendiceal lesion. 3.  Hepatic steatosis. 4.  No evident renal or ureteral calculi.   No hydronephrosis. 5.  Prostate absent. 6.  Benign-appearing bone lesion inferior right iliac bone. 7.  Small ventral hernia containing only fat. Aortic Atherosclerosis (ICD10-I70.0). Electronically Signed   By: Lowella Grip III M.D.   On: 12/28/2017 08:17    Cardiac Studies   MRI pending  Patient Profile     67 y.o. male  with history of CAD s/p DES/PCI to  LAD 05/2017 with known 40% proximal and 40% distal LAD residual to  treat medically, strong family history of premature CAD, atrial fibrillation on Xarelto and hx of prostate cancer.  Assessment & Plan    1.  Atypical chest pain: -Patient with a known history of CAD s/p DES/PCI to LAD in 05/2017 with residual anginal pain.  Given the above, patient was taken to the cardiac Cath Lab 08/18/2017 with patent stent and nonobstructive CAD with plans for medical management of residual 40% stenosis.  --ng PE --neg troponins X 4 --possible pericarditis, cardiac MRI results pending overall he does feel better.   --soft tissue of neck Xray no acute process  2.  Paroxysmal atrial fibrillation: -Currently normal sinus rhythm/bradycardia -Continue Xarelto 20 mg -CHA2DS2VASc =3  For questions or updates,  please contact [facility] Please consult [email or web address] for contact info under     Signed, Cecilie Kicks, NP  12/29/2017, 3:59 PM    The patient was seen, examined and discussed with Cecilie Kicks, NP and I agree with the above.   The patient feels significantly better, cardiac MRI showed acute pericarditis. We will continue therapy with ibuprofen 400 mg TID x 1 week given DAPT and colchicine 0.6 mg PO BID x 3 months, toradol iv as needed for pain, anticipated discharge tomorrow.  Ena Dawley, MD 12/29/2017

## 2017-12-29 NOTE — Discharge Instructions (Signed)

## 2017-12-29 NOTE — Progress Notes (Signed)
PROGRESS NOTE  Jose Warner  MWU:[id number] DOB: 08/17/1950 DOA: 12/28/2017 PCP: Derinda Late, MD   Brief Narrative: Jose Warner is a 67 y.o. male with a history of CAD s/p DES to LAD Feb 2019, residual 40% proximal and distal LAD stenosis and subsequent admissions due to chest pain, HTN, HLD, +FH of premature CAD, esophageal spasm, AFib on xarelto, GERD, celiac disease, and prostate CA who presented to the ED 9/9 with left upper chest/axilla/neck pain associated with left arm numbness that had started the previous evening not responsive to NTG. He reported ongoing severe, constant pain not improved with morphine, but later improved with dilaudid and ativan. He had mild bradycardia with stable blood pressure and a unremarkable physical examination. Extensive work up in the ED was inconclusive and included cardiac enzymes undetectable, ECG without acute ischemic changes, only flattened T waves in III, CXR neormal, normal CBC w/diff, BMP with CO2 21, glucose 115, calcium 8.8, otherwise normal. CRP undetectable and ESR was 1. D-dimer undetectable. CTA chest, abdomen and pelvis performed showed no findings to explain these symptoms. Subsequent CT soft tissue of the neck demonstrated cervical spondylosis but no acute findings. Atherosclerosis noted on all imaging. Cardiology was consulted, recommending empiric treatment for acute pericarditis with NSAIDs. The following day his pain seems to be significantly improved. Cardiac MRI and cardiology reevaluation pending.  Assessment & Plan: Atypical chest pain: Involving left neck, axilla and fleeting left arm numbness. Work up largely negative to date.  - Continue Tx for presumed acute pericarditis as his symptoms are improving.  - As NSAIDs may help pain attributable to cervical spondylosis, these will be continued.   CAD s/p PCI: ECG nonacute, enzymes negative.  - Continue home medications including plavix, BB, lipid control, no ischemic evaluation  currently planned per cardiology. - Continue cardiac rehab as outpatient - Follow up with Dr. Claiborne Billings  Paroxysmal AFib: Currently sinus brady without pauses.  - Continue xarelto. - Continue bisoprolol  HTN:  - Continue home medications including bisoprolol, norvasc  Hyperlipidemia:  - Statin, zetia, niacin  GERD:  - Continue PPI BID  DVT prophylaxis: Xarelto Code Status: Full Family Communication: Wife at bedside Disposition Plan: Per cardiology  Consultants:   Cardiology  Procedures:   None  Antimicrobials:  None   Subjective: Pain now down to 3/10, was much worse with movements but now not really provoked by position, breathing, exertion. No dyspnea, leg swelling, orthopnea.   Objective: BP 120/68 (BP Location: Right Arm)   Pulse (!) 55   Temp 97.7 F (36.5 C) (Oral)   Resp 20   Ht _0  (1.727 m)   Wt 77.9 kg   SpO2 98%   BMI 26.12 kg/m   Gen: Well-appearing 67 y.o.male in NAD HEENT: MMM, sclerae/conjunctivae clear, posterior oropharynx clear, fair dentition Neck: neck supple; thyroid not enlarged  Pulm: Non-labored on room air; normal RR; CTAB, no wheezes  CV: Regular borderline bradycardia, no murmur, rub or gallop; No LE edema, No JVD; Neg hepatojugular reflux; distal pulses 2+; no supraclavicular subcutaneous emphysema on palpation.    GI: + BS; soft, NT, ND, no HSM Skin: No vesicular lesions, erythema or signs of trauma noted along left chest, axilla, neck. No midline chest incision. No signs of venous insufficiency.  MSK: No reproduction of chest pain on palpation Neuro: Alert and oriented x4, speech and gait are normal, strength symmetric 5/5 throughout, sensation intact to light touch throughout, romberg neg  Psych: A&Ox3, mood mildly anxious with broad affect,  no psychomotor agitation.    Time spent: 25 minutes.  Patrecia Pour, MD [facility] [email or web address] Password [email or web address] 12/29/2017, 3:31 PM

## 2017-12-30 ENCOUNTER — Encounter (HOSPITAL_COMMUNITY): Payer: Self-pay

## 2017-12-30 DIAGNOSIS — I10 Essential (primary) hypertension: Secondary | ICD-10-CM | POA: Diagnosis not present

## 2017-12-30 DIAGNOSIS — I25118 Atherosclerotic heart disease of native coronary artery with other forms of angina pectoris: Secondary | ICD-10-CM | POA: Diagnosis not present

## 2017-12-30 DIAGNOSIS — I300 Acute nonspecific idiopathic pericarditis: Secondary | ICD-10-CM | POA: Diagnosis not present

## 2017-12-30 DIAGNOSIS — I480 Paroxysmal atrial fibrillation: Secondary | ICD-10-CM | POA: Diagnosis not present

## 2017-12-30 DIAGNOSIS — R0789 Other chest pain: Secondary | ICD-10-CM | POA: Diagnosis not present

## 2017-12-30 MED ORDER — IBUPROFEN 400 MG PO TABS: 400.0000 mg | ORAL_TABLET | Freq: Three times a day (TID) | ORAL | 0 refills | Status: AC

## 2017-12-30 MED ORDER — COLCHICINE 0.6 MG PO TABS: 0.6000 mg | ORAL_TABLET | Freq: Two times a day (BID) | ORAL | 0 refills | Status: DC

## 2017-12-30 NOTE — Care Management Obs Status (Signed)
[facility] NOTIFICATION   Patient Details  Name: Jose Warner MRN: [id number] Date of Birth: October 12, 1950   Medicare Observation Status Notification Given:  Yes    Bethena Roys, RN 12/30/2017, 10:49 AM

## 2017-12-30 NOTE — Progress Notes (Signed)
12/30/2017 11:14 AM Discharge AVS meds taken today and those due this evening reviewed.  Follow-up appointments and when to call md reviewed.  D/C IV and TELE.  Questions and concerns addressed.   D/C home per orders. [place]

## 2017-12-30 NOTE — Progress Notes (Addendum)
Progress Note  Patient Name: Jose Warner Date of Encounter: 12/30/2017  Primary Cardiologist: Shelva Majestic, MD   Subjective   The pain has resolved. He is ready to go home.    Inpatient Medications    Scheduled Meds: . amLODipine  2.5 mg Oral QPM  . bisoprolol  2.5 mg Oral BH-q7a  . cholecalciferol  2,000 Units Oral QHS  . clopidogrel  75 mg Oral BH-q7a  . colchicine  0.6 mg Oral BID  . ezetimibe  10 mg Oral QPM  . ibuprofen  400 mg Oral TID  . mometasone-formoterol  2 puff Inhalation BID  . niacin  1,000 mg Oral QHS  . oxyCODONE  10 mg Oral Q12H  . pantoprazole  40 mg Oral BID  . pravastatin  80 mg Oral Daily  . rivaroxaban  20 mg Oral Q supper  . ascorbic acid  500 mg Oral QPM   Continuous Infusions:  PRN Meds: albuterol, HYDROcodone-acetaminophen, iopamidol, ketorolac, morphine injection, nitroGLYCERIN   Vital Signs    Vitals:   12/29/17 0600 12/29/17 1804 12/29/17 2030 12/30/17 0438  BP:  130/90 130/64 (!) 127/58  Pulse: (!) 55  (!) 57 (!) 56  Resp: 20  19 15   Temp:   97.9 F (36.6 C) 97.7 F (36.5 C)  TempSrc:   Oral Oral  SpO2: 98%  97% 100%  Weight:      Height:        Intake/Output Summary (Last 24 hours) at 12/30/2017 1013 Last data filed at 12/29/2017 1519 Gross per 24 hour  Intake 401.59 ml  Output -  Net 401.59 ml   Filed Weights   12/28/17 1556  Weight: 77.9 kg    Telemetry    SR wit PACs - Personally Reviewed  ECG    No new  - Personally Reviewed  Physical Exam   GEN: No acute distress.   Neck: No JVD Cardiac: RRR, no murmurs, rubs, or gallops.  Respiratory: Clear to auscultation bilaterally. GI: Soft, nontender, non-distended  MS: No edema; No deformity. Neuro:  Nonfocal  Psych: Normal affect   Labs    Chemistry Recent Labs  Lab 12/28/17 0545 12/29/17 0447  NA 140 139  K 3.8 4.1  CL 108 111  CO2 21* 22  GLUCOSE 115* 107*  BUN 16 11  CREATININE 0.95 0.99  CALCIUM 8.8* 8.3*  GFRNONAA >60 >60  GFRAA >60  >60  ANIONGAP 11 6     Hematology Recent Labs  Lab 12/28/17 0545 12/29/17 0447  WBC 5.9 5.0  RBC 5.21 4.59  HGB 15.0 13.0  HCT 44.6 41.0  MCV 85.6 89.3  MCH 28.8 28.3  MCHC 33.6 31.7  RDW 13.7 13.7  PLT 179 165    Cardiac Enzymes Recent Labs  Lab 12/28/17 0545 12/28/17 0911 12/28/17 1649 12/28/17 1950  TROPONINI <0.03 <0.03 <0.03 <0.03   No results for input(s): TROPIPOC in the last 168 hours.   BNPNo results for input(s): BNP, PROBNP in the last 168 hours.   DDimer  Recent Labs  Lab 12/28/17 1649  DDIMER <0.27     Radiology    Ct Soft Tissue Neck W Contrast  Result Date: 12/29/2017 CLINICAL DATA:  67 y/o M; left-sided chest, neck, and supraclavicular pain. EXAM: CT NECK WITH CONTRAST TECHNIQUE: Multidetector CT imaging of the neck was performed using the standard protocol following the bolus administration of intravenous contrast. CONTRAST:  75 cc Omnipaque 300 COMPARISON:  None. FINDINGS: Pharynx and larynx: Normal. No mass  or swelling. Salivary glands: No inflammation, mass, or stone. Thyroid: Normal. Lymph nodes: None enlarged or abnormal density. Vascular: Mild calcific atherosclerosis of the aortic arch and carotid siphons without stenosis. Limited intracranial: Negative. Visualized orbits: Negative. Mastoids and visualized paranasal sinuses: Clear. Skeleton: Mild cervical spondylosis with loss of intervertebral disc space height at the C5-6 level. No high-grade bony foraminal or canal stenosis. Upper chest: Negative. Other: None. IMPRESSION: 1. No acute process identified. 2. Mild cervical spondylosis greatest at C5-6 level. 3. Mild aortic and carotid bifurcation calcific atherosclerosis. Electronically Signed   By: Kristine Garbe M.D.   On: 12/29/2017 04:23    Cardiac Studies   MRI pending  Patient Profile     67 y.o. male  with history of CAD s/p DES/PCI to  LAD 05/2017 with known 40% proximal and 40% distal LAD residual to  treat medically,  strong family history of premature CAD, atrial fibrillation on Xarelto and hx of prostate cancer.  Assessment & Plan    1.  Acute pericarditis The patient feels significantly better, cardiac MRI showed acute pericarditis. We will continue therapy with ibuprofen 400 mg BID x 1 week total given xarelto and plavix and colchicine 0.6 mg PO BID x 3 months. He can be discharged today. He can continue his cardiac rehabilitation. He has appointment with [name] scheduled for 01/19/2018 scheduled already.   2.  Paroxysmal atrial fibrillation: -Currently normal sinus rhythm/bradycardia -Continue Xarelto 20 mg -CHA2DS2VASc =3  3. CAD, s/p PCI/LAD in 05/2017 - stable on Plavix, (no ASA as Xarelto), Bisoprolol  4. HLP  - on pravastatin, zetia and niacin  Ena Dawley, MD 12/30/2017

## 2017-12-31 ENCOUNTER — Encounter (HOSPITAL_COMMUNITY): Payer: Self-pay

## 2017-12-31 NOTE — Discharge Summary (Signed)
Physician Discharge Summary  SHANDY VI WUJ:[id number] DOB: Mar 08, 1951 DOA: 12/28/2017  PCP: Derinda Late, MD  Admit date: 12/28/2017 Discharge date: 12/30/2017  Admitted From: Home.  Disposition:  Home.   Recommendations for Outpatient Follow-up:  1. Follow up with PCP in 1-2 weeks 2. Please obtain BMP/CBC in one week Please follow up with cardiology as recommended.   Discharge Condition:stable.  CODE STATUS:full code.  Diet recommendation: Heart Healthy  Brief/Interim Summary:  Jose Warner is a 67 y.o. male with a history of CAD s/p DES to LAD Feb 2019, residual 40% proximal and distal LAD stenosis and subsequent admissions due to chest pain, HTN, HLD, +FH of premature CAD, esophageal spasm, AFib on xarelto, GERD, celiac disease, and prostate CA who presented to the ED 9/9 with left upper chest/axilla/neck pain associated with left arm numbness that had started the previous evening not responsive to NTG. He reported ongoing severe, constant pain not improved with morphine, but later improved with dilaudid and ativan. He had mild bradycardia with stable blood pressure and a unremarkable physical examination. Extensive work up in the ED was inconclusive and included cardiac enzymes undetectable, ECG without acute ischemic changes, only flattened T waves in III, CXR neormal, normal CBC w/diff, BMP with CO2 21, glucose 115, calcium 8.8, otherwise normal. CRP undetectable and ESR was 1. D-dimer undetectable. CTA chest, abdomen and pelvis performed showed no findings to explain these symptoms. Subsequent CT soft tissue of the neck demonstrated cervical spondylosis but no acute findings. Atherosclerosis noted on all imaging. Cardiology was consulted, recommending empiric treatment for acute pericarditis with NSAIDs. The following day his pain seems to be significantly improved   Discharge Diagnoses:  Active Problems:   Chest pain  Atypical chest pain: Involving left neck, axilla and  fleeting left arm numbness. Work up largely negative to date.  - Continue Tx for presumed acute pericarditis as his symptoms are improving.  Cardiology recommended colchicine for 3 months and NSAIDS FOR one week.    CAD s/p PCI: ECG nonacute, enzymes negative.  - Continue home medications including plavix, BB, lipid control, no ischemic evaluation currently planned per cardiology. - Continue cardiac rehab as outpatient - Follow up with Dr. Claiborne Billings  Paroxysmal AFib: Currently sinus brady without pauses.  - Continue xarelto. - Continue bisoprolol  HTN:  - Continue home medications including bisoprolol, norvasc  Hyperlipidemia:  - Statin, zetia, niacin  GERD:  - Continue PPI BID  Discharge Instructions  Discharge Instructions    Diet - low sodium heart healthy   Complete by:  As directed    Discharge instructions   Complete by:  As directed    Please follow up with cardiology as recommended.  Please follow up with PCP in 1 to 2 weeks.     Allergies as of 12/30/2017      Reactions   Gluten Meal Other (See Comments)   Celiac, stomach crams , gas   Wheat Bran Other (See Comments)   Celiac   Statins    Leg muscle pains - rosuvastatin, atorvastatin, zetia. Tolerates simvastatin   Sulfa Antibiotics Rash      Medication List    TAKE these medications   amLODipine 2.5 MG tablet Commonly known as:  NORVASC Take 1 tablet (2.5 mg total) by mouth daily. What changed:  when to take this   ascorbic acid 500 MG tablet Commonly known as:  VITAMIN C Take 500 mg by mouth every evening.   bisoprolol 5 MG tablet Commonly known as:  ZEBETA Take 0.5 tablets (2.5 mg total) by mouth daily. What changed:  when to take this   budesonide-formoterol 160-4.5 MCG/ACT inhaler Commonly known as:  SYMBICORT Inhale 2 puffs into the lungs 2 (two) times daily as needed (for cough).   clopidogrel 75 MG tablet Commonly known as:  PLAVIX Take 1 tablet (75 mg total) by mouth daily. What  changed:  when to take this   colchicine 0.6 MG tablet Take 1 tablet (0.6 mg total) by mouth 2 (two) times daily.   ezetimibe 10 MG tablet Commonly known as:  ZETIA Take 10 mg by mouth every evening.   ibuprofen 400 MG tablet Commonly known as:  ADVIL,MOTRIN Take 1 tablet (400 mg total) by mouth 3 (three) times daily for 6 days.   MULTIVITAMIN PO Take 1 tablet by mouth at bedtime.   niacin 1000 MG CR tablet Commonly known as:  NIASPAN Take 1 tablet by mouth at bedtime.   nitroGLYCERIN 0.4 MG SL tablet Commonly known as:  NITROSTAT Place 1 tablet (0.4 mg total) under the tongue every 5 (five) minutes x 3 doses as needed for chest pain.   pantoprazole 40 MG tablet Commonly known as:  PROTONIX Take 40 mg by mouth 2 (two) times daily.   pravastatin 80 MG tablet Commonly known as:  PRAVACHOL Take 80 mg by mouth daily.   PROAIR HFA 108 (90 Base) MCG/ACT inhaler Generic drug:  albuterol Take 2 puffs by mouth 2 (two) times daily as needed. For shortness of breath.   rivaroxaban 20 MG Tabs tablet Commonly known as:  XARELTO Take 1 tablet (20 mg total) by mouth daily with supper.   Vitamin D (Ergocalciferol) 2000 units Caps Take 1 capsule by mouth at bedtime.      Follow-up Information    Derinda Late, MD. Schedule an appointment as soon as possible for a visit in 1 week(s).   Specialty:  Family Medicine Contact information: [place] [address] [phone]        Troy Sine, MD .   Specialty:  Cardiology Contact information: [address] [place] [address] [phone]          Allergies  Allergen Reactions  . Gluten Meal Other (See Comments)    Celiac, stomach crams , gas  . Wheat Bran Other (See Comments)    Celiac   . Statins     Leg muscle pains - rosuvastatin, atorvastatin, zetia. Tolerates simvastatin  . Sulfa Antibiotics Rash    Consultations:  Cardiology.   Procedures/Studies: Dg Chest  2 View  Result Date: 12/28/2017 CLINICAL DATA:  Chest pain EXAM: CHEST - 2 VIEW COMPARISON:  08/17/2017 FINDINGS: The heart size and mediastinal contours are within normal limits. Both lungs are clear. The visualized skeletal structures are unremarkable. IMPRESSION: No active cardiopulmonary disease. Electronically Signed   By: Ulyses Jarred M.D.   On: 12/28/2017 06:11   Ct Soft Tissue Neck W Contrast  Result Date: 12/29/2017 CLINICAL DATA:  67 y/o M; left-sided chest, neck, and supraclavicular pain. EXAM: CT NECK WITH CONTRAST TECHNIQUE: Multidetector CT imaging of the neck was performed using the standard protocol following the bolus administration of intravenous contrast. CONTRAST:  75 cc Omnipaque 300 COMPARISON:  None. FINDINGS: Pharynx and larynx: Normal. No mass or swelling. Salivary glands: No inflammation, mass, or stone. Thyroid: Normal. Lymph nodes: None enlarged or abnormal density. Vascular: Mild calcific atherosclerosis of the aortic arch and carotid siphons without stenosis. Limited intracranial: Negative. Visualized  orbits: Negative. Mastoids and visualized paranasal sinuses: Clear. Skeleton: Mild cervical spondylosis with loss of intervertebral disc space height at the C5-6 level. No high-grade bony foraminal or canal stenosis. Upper chest: Negative. Other: None. IMPRESSION: 1. No acute process identified. 2. Mild cervical spondylosis greatest at C5-6 level. 3. Mild aortic and carotid bifurcation calcific atherosclerosis. Electronically Signed   By: Kristine Garbe M.D.   On: 12/29/2017 04:23   Mr Cardiac Morphology W Wo Contrast  Result Date: 12/30/2017 CLINICAL DATA:  67 year old male with recent PCI to LAD presenting with atypical chest pain suspicious for an acute pericarditis. EXAM: CARDIAC MRI TECHNIQUE: The patient was scanned on a 1.5 Tesla GE magnet. A dedicated cardiac coil was used. Functional imaging was done using Fiesta sequences. 2,3, and 4 chamber views were done to  assess for RWMA's. Modified Simpson's rule using a short axis stack was used to calculate an ejection fraction on a dedicated work Conservation officer, nature. The patient received 12 cc of Gadovist. After 10 minutes inversion recovery sequences were used to assess for infiltration and scar tissue. CONTRAST:  12 ml of Gadovist FINDINGS: 1. Normal left ventricular size, thickness and hyperdynamic systolic function (LVEF = 71%). There are no regional wall motion abnormalities. There is no late gadolinium enhancement in the left ventricular myocardium. LVEDD: 55 mm LVESD: 30 mm LVEDV: 156 ml LVESV: 45 ml SV: 110 ml CO: 9.1 L/min Myocardial mass: 114 g 2. Normal right ventricular size, thickness and systolic function (LVEF = 73%). There are no regional wall motion abnormalities. 3. Mildly dilated left atrium. There is atypical LGE of the left and right atrial walls suspicious for fibrosis. 4. Normal size of the aortic root, ascending aorta and pulmonary artery. 5.  Mild aortic and mitral and trivial tricuspid regurgitation. 6. No pericardial effusion. There is mild late gadolinium enhancement adjacent to the inferolateral, anterior left ventricular walls and RV walls. IMPRESSION: 1. Normal left ventricular size, thickness and hyperdynamic systolic function (LVEF = 71%). There are no regional wall motion abnormalities. There is no late gadolinium enhancement in the left ventricular myocardium. 2. Normal right ventricular size, thickness and systolic function (LVEF = 73%). There are no regional wall motion abnormalities. 3. Mildly dilated left atrium. There is atypical LGE of the left and right atrial walls suspicious for fibrosis. 4. Normal size of the aortic root, ascending aorta and pulmonary artery. 5.  Mild aortic and mitral and trivial tricuspid regurgitation. 6. No pericardial effusion. There is mild late gadolinium enhancement adjacent to the inferolateral and anterior left ventricular walls and RV free wall.  Collectively, these findings are consistent with an acute pericarditis. LVEF has improved when compared to the prior echocardiogram on 06/10/2017 from 50-55% to 71%. Electronically Signed   By: Ena Dawley   On: 12/30/2017 11:54   Ct Angio Chest/abd/pel For Dissection W And/or Wo Contrast  Result Date: 12/28/2017 CLINICAL DATA:  Chest and abdominal pain EXAM: CT ANGIOGRAPHY CHEST, ABDOMEN AND PELVIS TECHNIQUE: Initially, axial CT images were obtained through the chest without intravenous contrast material administration. Multidetector CT imaging through the chest, abdomen and pelvis was performed using the standard protocol during bolus administration of intravenous contrast. Multiplanar reconstructed images and MIPs were obtained and reviewed to evaluate the vascular anatomy. CONTRAST:  100 mL ISOVUE-370 IOPAMIDOL (ISOVUE-370) INJECTION 76% COMPARISON:  Chest CT August 03, 2017; chest radiograph December 28, 2016 FINDINGS: CTA CHEST FINDINGS Cardiovascular: On the noncontrast enhanced study, there is no intramural hematoma evident. There  is no thoracic aortic aneurysm or dissection. Visualized great vessels appear unremarkable. No evident pulmonary embolus. There is no pericardial effusion or pericardial thickening. There is aortic atherosclerosis. There are foci of coronary artery calcification. Mediastinum/Nodes: Visualized thyroid appears. There is no evident thoracic adenopathy. No esophageal lesions are appreciable. Lungs/Pleura: There is slight bibasilar atelectatic change. There is no edema or consolidation. No pleural effusion or pleural thickening evident. Musculoskeletal: No blastic or lytic bone lesions are identified. There are no chest wall lesions evident. Review of the MIP images confirms the above findings. CTA ABDOMEN AND PELVIS FINDINGS VASCULAR Aorta: There is no abdominal aortic aneurysm or dissection. There are foci aortic atherosclerosis without hemodynamically significant obstruction.  Celiac: The celiac artery and its major branches appear patent. No aneurysm or dissection involving the celiac artery and its major branches evident. SMA: The superior mesenteric artery and its major branches appear widely patent. No appreciable aneurysm or dissection involving the superior mesenteric artery or its branches. Renals: There is a single renal artery on the right with 2 adjacent renal arteries on the left. There is mild proximal renal artery atherosclerotic change. No hemodynamically significant obstruction. There is no fibromuscular dysplasia evident. IMA: The inferior mesenteric artery and its branches are patent without aneurysm or dissection involving the inferior mesenteric artery and its major branches. Inflow: There is moderate calcification throughout each common iliac artery. No hemodynamically significant obstruction seen on either side. There are scattered foci of calcification in each internal and external iliac artery. No hemodynamically significant obstruction. There is slight calcification in the mid portions of each common femoral artery. No aneurysm or dissection involving major pelvic arterial vessels. Proximal superficial femoral and profunda femoral arteries are also patent. Veins: No obvious venous abnormality within the limitations of this arterial phase study. Review of the MIP images confirms the above findings. NON-VASCULAR Hepatobiliary: There is evident hepatic steatosis. There is a cyst in the left lobe of the liver measuring 1.6 x 1.6 cm. No other focal liver lesion is evident on arterial phase imaging of the liver. Gallbladder wall is not appreciably thickened. There is no biliary duct dilatation. Pancreas: No pancreatic mass or inflammatory focus. Spleen: No splenic lesions are evident. Adrenals/Urinary Tract: Adrenals bilaterally appear unremarkable. There is a cyst arising from the upper pole left kidney measuring 3.3 x 3.2 cm. There is no hydronephrosis on either side.  There is no renal or ureteral calculus on either side. Urinary bladder is midline with wall thickness within normal limits. There is a small diverticulum arising from the rightward aspect of the urinary bladder inferiorly measuring 1.3 x 1.2 cm. Stomach/Bowel: There are sigmoid diverticula without diverticulitis. There is no appreciable bowel wall or mesenteric thickening. No evident bowel obstruction. No free air or portal venous air. Lymphatic: No adenopathy is evident in the abdomen or pelvis. Reproductive: Prostate and seminal vesicles are absent. There is no evident pelvic mass. Other: Appendix appears unremarkable. No abscess or ascites is evident in the abdomen or pelvis. There is a small ventral hernia containing only fat. There is fat in each inguinal ring. Musculoskeletal: There is no lytic or destructive bone lesion. There is a benign-appearing lesion in the inferior right iliac bone measuring 1.8 x 1.5 cm with a lucent center and sclerotic periphery. There is a small sclerotic focus in the lower right iliac bone which potentially may represent a bone island as an isolated finding. There are no intramuscular lesions evident. Review of the MIP images confirms the above findings. IMPRESSION:  CT angiogram chest: 1. No demonstrable thoracic aortic aneurysm or dissection. No intramural hematoma evident. No demonstrable pulmonary embolus. There are foci aortic atherosclerosis as well as coronary artery calcification. 2.  No edema or consolidation.  Mild bibasilar atelectasis. 3.  No appreciable thoracic adenopathy. CT angiogram abdomen; CT angiogram pelvis: 1. No aneurysm or dissection involving the abdominal aorta or major pelvic/mesenteric arterial vessels. Scattered areas of non hemodynamically significant atherosclerosis noted at multiple sites. 2. Sigmoid diverticulosis without diverticulitis. No bowel obstruction. No abscess in the abdomen pelvis. No appendiceal lesion. 3.  Hepatic steatosis. 4.  No  evident renal or ureteral calculi.  No hydronephrosis. 5.  Prostate absent. 6.  Benign-appearing bone lesion inferior right iliac bone. 7.  Small ventral hernia containing only fat. Aortic Atherosclerosis (ICD10-I70.0). Electronically Signed   By: Lowella Grip III M.D.   On: 12/28/2017 08:17       Subjective:  No chest pain or sob.  Discharge Exam: Vitals:   12/29/17 2030 12/30/17 0438  BP: 130/64 (!) 127/58  Pulse: (!) 57 (!) 56  Resp: 19 15  Temp: 97.9 F (36.6 C) 97.7 F (36.5 C)  SpO2: 97% 100%   Vitals:   12/29/17 0600 12/29/17 1804 12/29/17 2030 12/30/17 0438  BP:  130/90 130/64 (!) 127/58  Pulse: (!) 55  (!) 57 (!) 56  Resp: 20  19 15   Temp:   97.9 F (36.6 C) 97.7 F (36.5 C)  TempSrc:   Oral Oral  SpO2: 98%  97% 100%  Weight:      Height:        General: Pt is alert, awake, not in acute distress Cardiovascular: RRR, S1/S2 +, no rubs, no gallops Respiratory: CTA bilaterally, no wheezing, no rhonchi Abdominal: Soft, NT, ND, bowel sounds + Extremities: no edema, no cyanosis    The results of significant diagnostics from this hospitalization (including imaging, microbiology, ancillary and laboratory) are listed below for reference.     Microbiology: No results found for this or any previous visit (from the past 240 hour(s)).   Labs: BNP (last 3 results) No results for input(s): BNP in the last 8760 hours. Basic Metabolic Panel: Recent Labs  Lab 12/28/17 0545 12/28/17 1950 12/29/17 0447  NA 140  --  139  K 3.8  --  4.1  CL 108  --  111  CO2 21*  --  22  GLUCOSE 115*  --  107*  BUN 16  --  11  CREATININE 0.95  --  0.99  CALCIUM 8.8*  --  8.3*  MG  --  2.1  --   PHOS  --  3.7  --    Liver Function Tests: No results for input(s): AST, ALT, ALKPHOS, BILITOT, PROT, ALBUMIN in the last 168 hours. No results for input(s): LIPASE, AMYLASE in the last 168 hours. No results for input(s): AMMONIA in the last 168 hours. CBC: Recent Labs  Lab  12/28/17 0545 12/29/17 0447  WBC 5.9 5.0  NEUTROABS 2.4  --   HGB 15.0 13.0  HCT 44.6 41.0  MCV 85.6 89.3  PLT 179 165   Cardiac Enzymes: Recent Labs  Lab 12/28/17 0545 12/28/17 0911 12/28/17 1649 12/28/17 1950  TROPONINI <0.03 <0.03 <0.03 <0.03   BNP: Invalid input(s): POCBNP CBG: No results for input(s): GLUCAP in the last 168 hours. D-Dimer Recent Labs    12/28/17 1649  DDIMER <0.27   Hgb A1c No results for input(s): HGBA1C in the last 72 hours. Lipid Profile No  results for input(s): CHOL, HDL, LDLCALC, TRIG, CHOLHDL, LDLDIRECT in the last 72 hours. Thyroid function studies No results for input(s): TSH, T4TOTAL, T3FREE, THYROIDAB in the last 72 hours.  Invalid input(s): FREET3 Anemia work up No results for input(s): VITAMINB12, FOLATE, FERRITIN, TIBC, IRON, RETICCTPCT in the last 72 hours. Urinalysis No results found for: COLORURINE, APPEARANCEUR, LABSPEC, [place], GLUCOSEU, HGBUR, BILIRUBINUR, KETONESUR, PROTEINUR, UROBILINOGEN, NITRITE, LEUKOCYTESUR Sepsis Labs Invalid input(s): PROCALCITONIN,  WBC,  LACTICIDVEN Microbiology No results found for this or any previous visit (from the past 240 hour(s)).   Time coordinating discharge: 31 minutes  SIGNED:   Hosie Poisson, MD  [facility] 12/31/2017, 8:41 AM Pager   If 7PM-7AM, please contact night-coverage [email or web address] Password [email or web address]

## 2018-01-01 ENCOUNTER — Encounter

## 2018-01-05 ENCOUNTER — Encounter (HOSPITAL_COMMUNITY)
Admission: RE | Admit: 2018-01-05 | Discharge: 2018-01-05 | Disposition: A | Discharge status: Home / Self Care | Payer: Self-pay | Source: Ambulatory Visit | Attending: Cardiovascular Disease | Admitting: Cardiovascular Disease

## 2018-01-06 ENCOUNTER — Encounter (HOSPITAL_COMMUNITY)
Admission: RE | Admit: 2018-01-06 | Discharge: 2018-01-06 | Disposition: A | Discharge status: Home / Self Care | Payer: Self-pay | Source: Ambulatory Visit | Attending: Cardiovascular Disease | Admitting: Cardiovascular Disease

## 2018-01-07 ENCOUNTER — Encounter (HOSPITAL_COMMUNITY)
Admission: RE | Admit: 2018-01-07 | Discharge: 2018-01-07 | Disposition: A | Discharge status: Home / Self Care | Payer: Medicare Other | Source: Ambulatory Visit | Attending: Cardiovascular Disease | Admitting: Cardiovascular Disease

## 2018-01-12 ENCOUNTER — Encounter (HOSPITAL_COMMUNITY)
Admission: RE | Admit: 2018-01-12 | Discharge: 2018-01-12 | Disposition: A | Discharge status: Home / Self Care | Payer: Self-pay | Source: Ambulatory Visit | Attending: Cardiovascular Disease | Admitting: Cardiovascular Disease

## 2018-01-13 ENCOUNTER — Encounter (HOSPITAL_COMMUNITY)
Admission: RE | Admit: 2018-01-13 | Discharge: 2018-01-13 | Disposition: A | Discharge status: Home / Self Care | Payer: Self-pay | Source: Ambulatory Visit | Attending: Cardiovascular Disease | Admitting: Cardiovascular Disease

## 2018-01-14 ENCOUNTER — Encounter (HOSPITAL_COMMUNITY)
Admission: RE | Admit: 2018-01-14 | Discharge: 2018-01-14 | Disposition: A | Discharge status: Home / Self Care | Payer: Self-pay | Source: Ambulatory Visit | Attending: Cardiovascular Disease | Admitting: Cardiovascular Disease

## 2018-01-19 ENCOUNTER — Ambulatory Visit (INDEPENDENT_AMBULATORY_CARE_PROVIDER_SITE_OTHER): Payer: Medicare Other | Admitting: Cardiovascular Disease

## 2018-01-19 ENCOUNTER — Encounter (HOSPITAL_COMMUNITY)
Admission: RE | Admit: 2018-01-19 | Discharge: 2018-01-19 | Disposition: A | Discharge status: Home / Self Care | Payer: Medicare Other | Source: Ambulatory Visit | Attending: Cardiovascular Disease | Admitting: Cardiovascular Disease

## 2018-01-19 ENCOUNTER — Encounter: Payer: Self-pay | Admitting: Cardiovascular Disease

## 2018-01-19 VITALS — BP 118/60 | HR 46 | Ht 68.0 in | Wt 172.8 lb

## 2018-01-19 DIAGNOSIS — I200 Unstable angina: Secondary | ICD-10-CM

## 2018-01-19 DIAGNOSIS — Z955 Presence of coronary angioplasty implant and graft: Secondary | ICD-10-CM | POA: Insufficient documentation

## 2018-01-19 DIAGNOSIS — I480 Paroxysmal atrial fibrillation: Secondary | ICD-10-CM

## 2018-01-19 DIAGNOSIS — Z7901 Long term (current) use of anticoagulants: Secondary | ICD-10-CM | POA: Diagnosis not present

## 2018-01-19 DIAGNOSIS — I25119 Atherosclerotic heart disease of native coronary artery with unspecified angina pectoris: Secondary | ICD-10-CM | POA: Diagnosis not present

## 2018-01-19 DIAGNOSIS — I319 Disease of pericardium, unspecified: Secondary | ICD-10-CM | POA: Diagnosis not present

## 2018-01-19 DIAGNOSIS — E782 Mixed hyperlipidemia: Secondary | ICD-10-CM

## 2018-01-19 DIAGNOSIS — Z48812 Encounter for surgical aftercare following surgery on the circulatory system: Secondary | ICD-10-CM | POA: Insufficient documentation

## 2018-01-19 MED ORDER — ASPIRIN EC 81 MG PO TBEC: 81.0000 mg | DELAYED_RELEASE_TABLET | Freq: Every day | ORAL | 3 refills | Status: AC

## 2018-01-19 NOTE — Progress Notes (Signed)
Patient ID: Jose Warner, male   DOB: 09/05/1950, 67 y.o.   MRN: [id number]     PCP: Dr. Derinda Late   HPI: Jose Warner is a 67 y.o. male who presents for a  followup evaluation following his recent repeat hospitalization.  Jose Warner has a strong family history for premature coronary artery disease. In May 2006 cardiac catheterization showed very mild LAD and circumflex narrowing felt to be not flow limiting at approximately 20-30%. He has a history of low HDL levels. He developed focal prostate cancer and underwent robotic prostatectomy by Dr. Araceli Bouche in 2014 When I saw him after his prostate surgery in March 2014 he was in atrial fibrillation at which time he was completely unaware of this rhythm disturbance. He was started on eliquis 5 mg twice a day anticoagulation and as well as metoprolol succinate. He subsequently converted spontaneously to sinus rhythm and has been maintaining sinus rhythm since. He remains active. He exercises daily. He denies any chest pain. He denies shortness of breath. He denies palpitations. His echo Doppler study showed an ejection fraction in the 55-65% range. He had  upper normal LA size. There is mild aortic insufficiency. Pulmonary pressures were normal with an estimated RV systolic pressure 15 mm.  In the past, he had been on Niaspan for low HDL levels. This was stopped when he was also put on cialis following his prostate surgery. He no longer takes cialis.  Laboratory done in April 2014 showed cholesterol 131 triglycerides 96 his HDL remains very low at 23 and his LDL was 89. Thyroid function studies were normal as were his hemoglobin hematocrit and chemistries.  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  He denies any episodes of chest pain.  Dr. Sandi Mariscal has put him back on his simvastatin 40 mg and niacin 1000 mg based on laboratory that he had done.  He continues to take Bystolic 5 mg for hypertension.  He  continues to have difficulty with erectile function following his prostate surgery.  He also was diagnosed with celiac disease and is extremely sensitive to gluten.   He remains active.  He is not aware of any recurrent arrhythmia.  Dr. Sandi Mariscal checks laboratory and he was told that his labs most recently were excellent.  He is tolerating Bystolic 5 mg.  He is followed by Dr. Alinda Money for his prostate.  He is on a gluten-free diet.   He was hospitalized on 06/09/2017 after having experienced intermittent chest pain  He was felt to potentially have unstableand stenting of a 95% mid LAD stenosiswithin a diffusely diseasedlcified segmen  There also was mild 40% narrowing proximal and distal to the stent.  He was discharged on 06/12/2017.  He felt vague recurrent chest pain leading to an additional overnight hospital stayon from 324.  Troponins were negative.  ECG was without ischemic changes and he was   Since he was bradycardic with heart rates in the 40s.    When I saw him for follow-up evaluation in March 2019 well and was gradually gaining his strength.  Because of his PAF, he was on Plavix and Xarelto.  He also was on bisoprolol 2.5 mg daily.  They continue to be on niacin and simvastatin per Dr. Sandi Mariscal and is on Symbicort with a history of asthma.   He developed a different type of chest pain that was lasted tense but had occurred consecutively on 3 days in a row.  He  had taken nitroglycerin with some improvement.  He was rehospitalized on August 17, 2017 and underwent repeat cardiac catheterization August 18, 2017 by Dr. Martinique.  His LAD stent was patent but he had 30% proximal, 40% mid, and 20% distal LAD stenoses.  He has felt improved.  He was treated for possible bronchitis and was given Z-Pak by Dr. Sandi Mariscal.    He was hospitalized from September 9 through December 30, 2017 after developing left upper chest, neck pain associated with left arm numbness not responsive to nitroglycerin.  His ECG did  not reveal ischemic changes.  D-dimer was detectable.  He was diagnosed as having acute pericarditis which was confirmed on MRI imaging and he was treated with ibuprofen in addition to colchicine.  CT also demonstrated cervical spondylosis without acute findings.  His symptoms have gradually improved therapy.  He still experiences occasional sharp discomfort but denies any recent classic pleuritic-like symptoms.  He presents for evaluation  Past Medical History:  Diagnosis Date  . Anemia   . Atrial fibrillation ([place]) 06/05/2017  . Bronchitis 05-10-12   past fall- 4 runs antibiotics due to bronchitis-uses [employer] as needed  . Celiac disease   . Colon polyps    adenomatous  . Coronary artery disease   . Diverticulosis   . GERD (gastroesophageal reflux disease)   . Hepatitis 05-10-12   "was told non A, non B"-unclean dental equip.  Marland Kitchen Hyperlipidemia   . Iron deficiency anemia   . Prostate cancer ([place]) 05-10-12   ;bx. 04-12-12, dx  . Spasm of esophagus 2013    Past Surgical History:  Procedure Laterality Date  . CARDIAC CATHETERIZATION  09/13/2004   LAD:30%-40% in prox. to mid segment with 20% in the mid segment and 20% in left Circ., mild 20% right common iliac narrowing. medical therapy  . CARDIOVERSION  06/05/2017  . CORONARY STENT INTERVENTION N/A 06/11/2017   Procedure: CORONARY STENT INTERVENTION;  Surgeon: Belva Crome, MD;  Location: [place] [facility];  Service: Cardiovascular;  Laterality: N/A;  . HERNIA REPAIR  5 yrs ago   right   . LEFT HEART CATH AND CORONARY ANGIOGRAPHY N/A 06/11/2017   Procedure: LEFT HEART CATH AND CORONARY ANGIOGRAPHY;  Surgeon: Belva Crome, MD;  Location: [place] [facility];  Service: Cardiovascular;  Laterality: N/A;  . LEFT HEART CATH AND CORONARY ANGIOGRAPHY N/A 08/18/2017   Procedure: LEFT HEART CATH AND CORONARY ANGIOGRAPHY;  Surgeon: Martinique, Peter M, MD;  Location: [place] [facility];  Service: Cardiovascular;  Laterality: N/A;  . NM MYOCAR PERF  WALL MOTION  11/30/2007   protocol:Bruce, post EF 67%,mild perfusion defect seen in basal inferior consistant with Attenuation artifact, exercise cap. 12METS  . ROBOT ASSISTED LAPAROSCOPIC RADICAL PROSTATECTOMY  05/20/2012   Procedure: ROBOTIC ASSISTED LAPAROSCOPIC RADICAL PROSTATECTOMY LEVEL 1;  Surgeon: Dutch Gray, MD;  Location: [facility];  Service: Urology;  Laterality: N/A;     . TRANSURETHRAL RESECTION OF PROSTATE  79yr ago    Allergies  Allergen Reactions  . Gluten Meal Other (See Comments)    Celiac, stomach crams , gas  . Wheat Bran Other (See Comments)    Celiac   . Statins     Leg muscle pains - rosuvastatin, atorvastatin, zetia. Tolerates simvastatin  . Sulfa Antibiotics Rash    Current Outpatient Medications  Medication Sig Dispense Refill  . amLODipine (NORVASC) 2.5 MG tablet Take 1 tablet (2.5 mg total) by mouth daily. (Patient taking differently: Take 2.5 mg by mouth every  evening. ) 90 tablet 3  . ascorbic acid (VITAMIN C) 500 MG tablet Take 500 mg by mouth every evening.     . bisoprolol (ZEBETA) 5 MG tablet Take 0.5 tablets (2.5 mg total) by mouth daily. (Patient taking differently: Take 2.5 mg by mouth every morning. ) 45 tablet 3  . budesonide-formoterol (SYMBICORT) 160-4.5 MCG/ACT inhaler Inhale 2 puffs into the lungs 2 (two) times daily as needed (for cough).     . colchicine 0.6 MG tablet Take 1 tablet (0.6 mg total) by mouth 2 (two) times daily. 182 tablet 0  . ezetimibe (ZETIA) 10 MG tablet Take 10 mg by mouth every evening.     . Multiple Vitamins-Minerals (MULTIVITAMIN PO) Take 1 tablet by mouth at bedtime.     . niacin (NIASPAN) 1000 MG CR tablet Take 1 tablet by mouth at bedtime.     . nitroGLYCERIN (NITROSTAT) 0.4 MG SL tablet Place 1 tablet (0.4 mg total) under the tongue every 5 (five) minutes x 3 doses as needed for chest pain. 30 tablet 0  . pantoprazole (PROTONIX) 40 MG tablet Take 40 mg by mouth 2 (two) times daily.     . pravastatin (PRAVACHOL) 80 MG  tablet Take 80 mg by mouth daily.    Marland Kitchen PROAIR HFA 108 (90 BASE) MCG/ACT inhaler Take 2 puffs by mouth 2 (two) times daily as needed. For shortness of breath.    . rivaroxaban (XARELTO) 20 MG TABS tablet Take 1 tablet (20 mg total) by mouth daily with supper. 90 tablet 3  . Vitamin D, Ergocalciferol, 2000 units CAPS Take 1 capsule by mouth at bedtime.     Marland Kitchen aspirin EC 81 MG tablet Take 1 tablet (81 mg total) by mouth daily. 90 tablet 3   No current facility-administered medications for this visit.     Social History   Socioeconomic History  . Marital status: Married    Spouse name: Not on file  . Number of children: 3  . Years of education: Not on file  . Highest education level: Not on file  Occupational History    Employer: [employer]  . Financial resource strain: Not on file  . Food insecurity:    Worry: Patient refused    Inability: Patient refused  . Transportation needs:    Medical: Patient refused    Non-medical: Patient refused  Tobacco Use  . Smoking status: Never Smoker  . Smokeless tobacco: Never Used  Substance and Sexual Activity  . Alcohol use: Yes    Alcohol/week: 2.0 standard drinks    Types: 2 Glasses of wine per week    Comment: occ. x 2 drinks weekly  . Drug use: No  . Sexual activity: Yes    Partners: Female  Lifestyle  . Physical activity:    Days per week: Not on file    Minutes per session: Not on file  . Stress: Not on file  Relationships  . Social connections:    Talks on phone: Patient refused    Gets together: Patient refused    Attends religious service: Patient refused    Active member of club or organization: Patient refused    Attends meetings of clubs or organizations: Patient refused    Relationship status: Patient refused  . Intimate partner violence:    Fear of current or ex partner: Patient refused    Emotionally abused: Patient refused    Physically abused: Patient refused    Forced sexual activity: Patient refused  Other Topics Concern  . Not on file  Social History Narrative  . Not on file    Socially, he is a PhD and Is now retired from [employer].  He had been in the turf business and floral arrangements.  ROS General: Negative; No fevers, chills, or night sweats;  HEENT: Negative; No changes in vision or hearing, sinus congestion, difficulty swallowing Pulmonary: Negative; No cough, wheezing, shortness of breath, hemoptysis Cardiovascular: see HPI GI: Positive for celiac disease GU: Positive for erectile dysfunction since his robotic prostatic surgery No dysuria, hematuria, or difficulty voiding Musculoskeletal: Negative; no myalgias, joint pain, or weakness Hematologic/Oncology: Negative; no easy bruising, bleeding Endocrine: Negative; no heat/cold intolerance; no diabetes Neuro: Negative; no changes in balance, headaches Skin: Negative; No rashes or skin lesions Psychiatric: Negative; No behavioral problems, depression Sleep: Negative; No snoring, daytime sleepiness, hypersomnolence, bruxism, restless legs, hypnogognic hallucinations, no cataplexy Other comprehensive 14 point system review is negative.   PE BP 118/60   Pulse (!) 46   Ht 5' 8"  (1.727 m)   Wt 172 lb 12.8 oz (78.4 kg)   BMI 26.27 kg/m     Repeat blood pressure by me was 122/60.  Wt Readings from Last 3 Encounters:  01/19/18 172 lb 12.8 oz (78.4 kg)  12/28/17 171 lb 12.8 oz (77.9 kg)  11/09/17 174 lb 6.1 oz (79.1 kg)   General: Alert, oriented, no distress.  Skin: normal turgor, no rashes, warm and dry HEENT: Normocephalic, atraumatic. Pupils equal round and reactive to light; sclera anicteric; extraocular muscles intact; Fundi ** Nose without nasal septal hypertrophy Mouth/Parynx benign; Mallinpatti scale 2 Neck: No JVD, no carotid bruits; normal carotid upstroke Lungs: clear to ausculatation and percussion; no wheezing or rales Chest wall: without tenderness to palpitation Heart: PMI not displaced, RRR, s1 s2  normal, 1/6 systolic murmur, no diastolic murmur, no rubs, gallops, thrills, or heaves Abdomen: soft, nontender; no hepatosplenomehaly, BS+; abdominal aorta nontender and not dilated by palpation. Back: no CVA tenderness Pulses 2+ Musculoskeletal: full range of motion, normal strength, no joint deformities Extremities: no clubbing cyanosis or edema, Homan's sign negative  Neurologic: grossly nonfocal; Cranial nerves grossly wnl Psychologic: Normal mood and affect   ECG (independently read by me): Sinus bradycardia at 46 bpm.  No ectopy.  Normal intervals.  No ST segment abnormalities  May 2019 ECG (independently read by me): Normal sinus rhythm at 66 bpm.normal sinus rhythm at 66 bpm.  No ectopy.  Normal intervals.  No ST segment depression. No ectopy.  Normal intervals.  No ST segment depression.   July 02, 2017 ECG (independently read by me): sinus bradycardia 51 bpm.  No ST segment changes.  Normal intervals.  November 2018ECG (independently read by me): Sinus bradycardia 56 bpm.  No ectopy.  Normal intervals.  October 2017 ECG (independently read by me): Sinus bradycardia 59 bpm.  Normal intervals.  No ectopy.  May 2015 ECG (and apparently read by me): Normal sinus rhythm at 60 beats per minute.  Early transition.  No ectopy.  PR interval 174 ms, QTc interval 460 ms.  ECG: (independently read by me): Sinus rhythm at 56 beats per minute. No ectopy. Normal intervals.  LABS:  BMP Latest Ref Rng & Units 12/29/2017 12/28/2017 08/18/2017  Glucose 70 - 99 mg/dL 107(H) 115(H) 102(H)  BUN 8 - 23 mg/dL 11 16 13   Creatinine 0.61 - 1.24 mg/dL 0.99 0.95 1.00  Sodium 135 - 145 mmol/L 139 140 140  Potassium 3.5 - 5.1 mmol/L 4.1 3.8  4.2  Chloride 98 - 111 mmol/L 111 108 109  CO2 22 - 32 mmol/L 22 21(L) 23  Calcium 8.9 - 10.3 mg/dL 8.3(L) 8.8(L) 8.4(L)    Hepatic Function Panel     Component Value Date/Time   PROT 5.7 (L) 06/12/2017 0804     CBC Latest Ref Rng & Units 12/29/2017 12/28/2017  08/18/2017  WBC 4.0 - 10.5 K/uL 5.0 5.9 6.0  Hemoglobin 13.0 - 17.0 g/dL 13.0 15.0 13.1  Hematocrit 39.0 - 52.0 % 41.0 44.6 41.3  Platelets 150 - 400 K/uL 165 179 176    BNP No results found for: PROBNP  Lipid Panel     Component Value Date/Time   CHOL 122 06/10/2017 0108     RADIOLOGY: No results found.  IMPRESSION:  1. Pericarditis, unspecified chronicity, unspecified type   2. Coronary artery disease involving native coronary artery of native heart with angina pectoris (HCC)   3. Paroxysmal atrial fibrillation ([place])   4. Anticoagulation adequate   5. Mixed hyperlipidemia     ASSESSMENT AND PLAN: Mr. Ashkar Is a 67 year-old gentleman who had undergone cardiac catheterization in May 2006 and was found to have mild nonobstructive CAD.  He has a history of hyperlipidemia with very low HDL levels and is back on niacin in addition to his simvastatin 40 mg followed by Dr. Sandi Mariscal.    He developed atrial fibrillation for which he was unaware following his robotic prostatectomy for his prostate cancer.  He has been maintaining sinus rhythm without recurrent atrial fibrillation.  He was hospitalized in February 2019 with intermittent chest pain.  Due to worrisome symptoms of unstable angina, catheterization was performed which revealed a 95% mid LAD stenosis in a calcified segment andmild additional concomitant CAD.  Initially received triple therapy but ultimately aspirin was discontinued.  He developed repeat chest pain leading to repeat catheterization on August 18, 2017.  This chest pain was less intense character but also did seem to be somewhat nitrate responsive.  He also took a Z-Pak for potential treatment of bronchitis.  Certainly possible that he may have had some mild spasm on top of his additional mild LAD disease.  I reviewed his recent hospitalization when he presented with distinctly different type of chest pain and ultimately was diagnosed with acute pericarditis confirmed by  MRI imaging.  Most recently he has been on Plavix in addition to Xarelto for his anticoagulation with his recent use of nonsteroidal anti-inflammatory medication and treatment with colchicine since it is been over 6 months since his stent placement I have recommended he discontinue Plavix but resume a baby aspirin 81 mg and continue Xarelto.  He will be on colchicine for at least 3 months duration.  Prior to discontinuing colchicine, I have recommended he undergo an echo Doppler study in early December and I will see him back in the office for follow-up evaluation.  He continues to be on amlodipine 2.5 mg, bisoprolol 5 mg twice a day and blood pressure today is stable.  He is on Zetia and pravastatin for hyperlipidemia most recent LDL cholesterol was 60.  He has not had recent anginal symptoms.  I will see him in December for follow-up.   Time spent 25 minutes Troy Sine, MD, [facility]  01/21/2018  5:52 AM

## 2018-01-19 NOTE — Patient Instructions (Signed)
Medication Instructions:  STOP Plavix  Resume Aspirin 81 mg daily  Continue Xarelto  Testing/Procedures: Your physician has requested that you have an echocardiogram in December. Echocardiography is a painless test that uses sound waves to create images of your heart. It provides your doctor with information about the size and shape of your heart and how well your heart's chambers and valves are working. This procedure takes approximately one hour. There are no restrictions for this procedure.  This will be done at our [facility] location:  [place]: December with Dr. Claiborne Billings (after echo)  Any Other Special Instructions Will Be Listed Below (If Applicable).     If you need a refill on your cardiac medications before your next appointment, please call your pharmacy.

## 2018-01-20 ENCOUNTER — Encounter (HOSPITAL_COMMUNITY)
Admission: RE | Admit: 2018-01-20 | Discharge: 2018-01-20 | Disposition: A | Discharge status: Home / Self Care | Payer: Self-pay | Source: Ambulatory Visit | Attending: Cardiovascular Disease | Admitting: Cardiovascular Disease

## 2018-01-21 ENCOUNTER — Encounter (HOSPITAL_COMMUNITY)
Admission: RE | Admit: 2018-01-21 | Discharge: 2018-01-21 | Disposition: A | Discharge status: Home / Self Care | Payer: Self-pay | Source: Ambulatory Visit | Attending: Cardiovascular Disease | Admitting: Cardiovascular Disease

## 2018-01-21 ENCOUNTER — Encounter: Payer: Self-pay | Admitting: Cardiovascular Disease

## 2018-01-26 ENCOUNTER — Encounter (HOSPITAL_COMMUNITY)
Admission: RE | Admit: 2018-01-26 | Discharge: 2018-01-26 | Disposition: A | Discharge status: Home / Self Care | Payer: Self-pay | Source: Ambulatory Visit | Attending: Cardiovascular Disease | Admitting: Cardiovascular Disease

## 2018-01-27 ENCOUNTER — Encounter (HOSPITAL_COMMUNITY): Payer: Self-pay

## 2018-01-28 ENCOUNTER — Encounter (HOSPITAL_COMMUNITY): Payer: Self-pay

## 2018-02-02 ENCOUNTER — Encounter (HOSPITAL_COMMUNITY): Payer: Self-pay

## 2018-02-03 ENCOUNTER — Encounter (HOSPITAL_COMMUNITY)
Admission: RE | Admit: 2018-02-03 | Discharge: 2018-02-03 | Disposition: A | Discharge status: Home / Self Care | Payer: Medicare Other | Source: Ambulatory Visit | Attending: Cardiovascular Disease | Admitting: Cardiovascular Disease

## 2018-02-04 ENCOUNTER — Encounter (HOSPITAL_COMMUNITY)
Admission: RE | Admit: 2018-02-04 | Discharge: 2018-02-04 | Disposition: A | Discharge status: Home / Self Care | Payer: Medicare Other | Source: Ambulatory Visit | Attending: Cardiovascular Disease | Admitting: Cardiovascular Disease

## 2018-02-08 ENCOUNTER — Telehealth: Payer: Self-pay | Admitting: Cardiovascular Disease

## 2018-02-08 NOTE — Telephone Encounter (Signed)
With resumption of Advil, continue Protonix 40 mg twice a day.  Can repeat echo sooner to reassess and make certain no significant pericardial effusion

## 2018-02-08 NOTE — Telephone Encounter (Signed)
Called patient, he states that he sent a mychart message on 02/03/18, explaining that he had another pericarditis flare up, so he has restarted Advil, as soon as he started the Advil he did feel better, but he is concerned because he does not need to stay on the Advil until December, because of digestive issues, so what else could he take to help, also he is concerned with having to wait until December to have his echo done as he thought he would be improving and not declining. Please advise. Thank you!

## 2018-02-08 NOTE — Telephone Encounter (Signed)
° ° °  Patient calling to request response to MyChart message from 10/16.  Patient wants to know next steps for treatment of pericarditis

## 2018-02-09 ENCOUNTER — Encounter (HOSPITAL_COMMUNITY): Payer: Self-pay

## 2018-02-09 NOTE — Telephone Encounter (Signed)
Called patient, advised of note from MD. Patient verbalized understanding, did state that he is trying to come off the Advil, but if he does take it that he will take the Protonix as instructed. Will send a message to scheduling to reschedule the ECHO.

## 2018-02-10 ENCOUNTER — Encounter (HOSPITAL_COMMUNITY): Payer: Self-pay

## 2018-02-11 ENCOUNTER — Encounter (HOSPITAL_COMMUNITY): Payer: Self-pay

## 2018-02-16 ENCOUNTER — Encounter (HOSPITAL_COMMUNITY): Payer: Self-pay

## 2018-02-17 ENCOUNTER — Ambulatory Visit (HOSPITAL_COMMUNITY): Payer: Medicare Other | Attending: Cardiovascular Disease

## 2018-02-17 ENCOUNTER — Other Ambulatory Visit: Payer: Self-pay

## 2018-02-17 ENCOUNTER — Encounter (HOSPITAL_COMMUNITY): Payer: Self-pay

## 2018-02-17 DIAGNOSIS — I319 Disease of pericardium, unspecified: Secondary | ICD-10-CM | POA: Insufficient documentation

## 2018-02-18 ENCOUNTER — Encounter (HOSPITAL_COMMUNITY): Payer: Self-pay

## 2018-02-19 ENCOUNTER — Telehealth: Payer: Self-pay | Admitting: Cardiovascular Disease

## 2018-02-19 ENCOUNTER — Emergency Department (HOSPITAL_BASED_OUTPATIENT_CLINIC_OR_DEPARTMENT_OTHER): Payer: Medicare Other

## 2018-02-19 ENCOUNTER — Other Ambulatory Visit: Payer: Self-pay

## 2018-02-19 ENCOUNTER — Encounter (HOSPITAL_BASED_OUTPATIENT_CLINIC_OR_DEPARTMENT_OTHER): Payer: Self-pay

## 2018-02-19 ENCOUNTER — Emergency Department (HOSPITAL_BASED_OUTPATIENT_CLINIC_OR_DEPARTMENT_OTHER)
Admission: EM | Admit: 2018-02-19 | Discharge: 2018-02-19 | Disposition: A | Discharge status: Home / Self Care | Payer: Medicare Other | Attending: Emergency Medicine | Admitting: Emergency Medicine

## 2018-02-19 DIAGNOSIS — Z8546 Personal history of malignant neoplasm of prostate: Secondary | ICD-10-CM | POA: Insufficient documentation

## 2018-02-19 DIAGNOSIS — R002 Palpitations: Secondary | ICD-10-CM | POA: Diagnosis present

## 2018-02-19 DIAGNOSIS — Z79899 Other long term (current) drug therapy: Secondary | ICD-10-CM | POA: Insufficient documentation

## 2018-02-19 DIAGNOSIS — Z7982 Long term (current) use of aspirin: Secondary | ICD-10-CM | POA: Diagnosis not present

## 2018-02-19 DIAGNOSIS — I2510 Atherosclerotic heart disease of native coronary artery without angina pectoris: Secondary | ICD-10-CM | POA: Insufficient documentation

## 2018-02-19 DIAGNOSIS — I480 Paroxysmal atrial fibrillation: Secondary | ICD-10-CM | POA: Diagnosis not present

## 2018-02-19 DIAGNOSIS — Z7901 Long term (current) use of anticoagulants: Secondary | ICD-10-CM | POA: Diagnosis not present

## 2018-02-19 LAB — BASIC METABOLIC PANEL
Anion gap: 10 (ref 5–15)
BUN: 14 mg/dL (ref 8–23)
CO2: 26 mmol/L (ref 22–32)
Calcium: 9.3 mg/dL (ref 8.9–10.3)
Chloride: 104 mmol/L (ref 98–111)
Creatinine, Ser: 1.12 mg/dL (ref 0.61–1.24)
GFR calc non Af Amer: >60 mL/min (ref >60)
Glucose, Bld: 111 mg/dL — ABNORMAL HIGH (ref 70–99)
POTASSIUM: 4.2 mmol/L (ref 3.5–5.1)
SODIUM: 140 mmol/L (ref 135–145)

## 2018-02-19 LAB — CBC
HEMATOCRIT: 52 % (ref 39.0–52.0)
HEMOGLOBIN: 16.3 g/dL (ref 13.0–17.0)
MCH: 27.9 pg (ref 26.0–34.0)
MCHC: 31.3 g/dL (ref 30.0–36.0)
MCV: 88.9 fL (ref 80.0–100.0)
NRBC: 0 % (ref 0.0–0.2)
Platelets: 215 10*3/uL (ref 150–400)
RBC: 5.85 MIL/uL — AB (ref 4.22–5.81)
RDW: 13.9 % (ref 11.5–15.5)
WBC: 6 10*3/uL (ref 4.0–10.5)

## 2018-02-19 LAB — MAGNESIUM: MAGNESIUM: 2.3 mg/dL (ref 1.7–2.4)

## 2018-02-19 LAB — TROPONIN I: Troponin I: <0.03 ng/mL (ref <0.03)

## 2018-02-19 MED ORDER — SODIUM CHLORIDE 0.9 % IV BOLUS
500.0000 mL | Freq: Once | INTRAVENOUS | Status: AC
  Administered 2018-02-19: 500 mL via INTRAVENOUS

## 2018-02-19 NOTE — Telephone Encounter (Signed)
Dr. Claiborne Billings spoke to Dr. Maryan Rued (ER MD) in regards to plan of care.   Patient currently in ED at [facility] med center.  See ER MD note

## 2018-02-19 NOTE — Telephone Encounter (Signed)
Patient c/o Palpitations:  High priority if patient c/o lightheadedness, shortness of breath, or chest pain  1) How long have you had palpitations/irregular HR/ Afib? Are you having the symptoms now? About a hour, Yes having them now.  2) Are you currently experiencing lightheadedness, SOB or CP? No  3) Do you have a history of afib (atrial fibrillation) or irregular heart rhythm? Yes  4) Have you checked your BP or HR? (document readings if available):HR 34 beats than normal  5) Are you experiencing any other symptoms? No

## 2018-02-19 NOTE — ED Triage Notes (Signed)
Pt c/o a-fib since 7a, hx of same, denies any pain or SOB. States had a ECHO yesterday with no results at this time

## 2018-02-19 NOTE — Telephone Encounter (Signed)
Pt states that he is in AFIB, he states that he is having palpitations denies any other sx. BP 124/94 HR 107 H/O pericarditis and a stent in May taking Xarelto 20 mg and Bisoprolol 5mg . Dr Willa Frater is in the office, informed pt I will discuss and CB.  D/w [name] pt is taking 1/2 bisoprolol have pt take the other 1/2 of the bisoprolol now and see if he wants to see PA. Informed pt of TK direction he states that he will go to the ER because he does not think he should wait until 1130am to be seen. He states that he will take the extra 1/2 of bisoprolol and the head to the ER

## 2018-02-19 NOTE — ED Provider Notes (Signed)
[facility] EMERGENCY DEPARTMENT Provider Note   CSN: [id number] Arrival date & time: 02/19/18  0857     History   Chief Complaint Chief Complaint  Patient presents with  . irregular heart beat    HPI Jose Warner is a 67 y.o. male.  Patient is a 67 year old male with a history of paroxysmal atrial fibrillation, coronary artery disease status post stenting of his LAD, pericarditis diagnosed by MRI in September currently on colchicine and ibuprofen presenting today due to feeling palpitations and irregular heartbeat.  Patient's last episode of atrial fibrillation was in February to his knowledge.  He currently does take Xarelto and aspirin daily.  He has had ongoing issues with the pericarditis and states he daily is feeling chest pressure and discomfort.  He states like it feels like a knot in his chest.  He will intermittently get sharp pains over the dull achy pain.  He denies fever, shortness of breath, cough, congestion.  He does have some mild discomfort in his epigastric area also since he has been taking this medicine.  Yesterday he did not notice any palpitations and states today he feels pretty good but his apple watch was telling him his heart rate was irregular.  He did speak with Dr. Evette Georges office and they offered him an appointment with the PA at 1130 but he felt like he needed to be seen sooner than that.  He denies any lower extremity swelling, other additional medications but he did take an extra Bisprolol per cardiology request.  The history is provided by the patient.    Past Medical History:  Diagnosis Date  . Anemia   . Atrial fibrillation ([place]) 06/05/2017  . Bronchitis 05-10-12   past fall- 4 runs antibiotics due to bronchitis-uses [employer] as needed  . Celiac disease   . Colon polyps    adenomatous  . Coronary artery disease   . Diverticulosis   . GERD (gastroesophageal reflux disease)   . Hepatitis 05-10-12   "was told non A, non B"-unclean  dental equip.  Marland Kitchen Hyperlipidemia   . Iron deficiency anemia   . Prostate cancer ([place]) 05-10-12   ;bx. 04-12-12, dx  . Spasm of esophagus 2013    Patient Active Problem List   Diagnosis Date Noted  . Chronic anticoagulation 08/17/2017  . Asthma 08/17/2017  . Unstable angina ([place]) 08/17/2017  . ACS (acute coronary syndrome) ([place]) 06/14/2017  . GERD (gastroesophageal reflux disease) 06/10/2017  . Chest pain 06/09/2017  . 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 FHx: coronary artery disease 11/26/2012  . H/O prostate cancer 11/26/2012  . Low HDL (under 40) 11/26/2012  . Paroxysmal atrial fibrillation ([place]) 11/26/2012  . CAD S/P percutaneous coronary angioplasty 11/26/2012    Past Surgical History:  Procedure Laterality Date  . CARDIAC CATHETERIZATION  09/13/2004   LAD:30%-40% in prox. to mid segment with 20% in the mid segment and 20% in left Circ., mild 20% right common iliac narrowing. medical therapy  . CARDIOVERSION  06/05/2017  . CORONARY STENT INTERVENTION N/A 06/11/2017   Procedure: CORONARY STENT INTERVENTION;  Surgeon: Belva Crome, MD;  Location: [facility];  Service: Cardiovascular;  Laterality: N/A;  . HERNIA REPAIR  5 yrs ago   right   . LEFT HEART CATH AND CORONARY ANGIOGRAPHY N/A 06/11/2017   Procedure: LEFT HEART CATH AND CORONARY ANGIOGRAPHY;  Surgeon: Belva Crome, MD;  Location: [facility];  Service: Cardiovascular;  Laterality: N/A;  .  LEFT HEART CATH AND CORONARY ANGIOGRAPHY N/A 08/18/2017   Procedure: LEFT HEART CATH AND CORONARY ANGIOGRAPHY;  Surgeon: Martinique, Peter M, MD;  Location: [facility];  Service: Cardiovascular;  Laterality: N/A;  . NM MYOCAR PERF WALL MOTION  11/30/2007   protocol:Bruce, post EF 67%,mild perfusion defect seen in basal inferior consistant with Attenuation artifact, exercise cap. 12METS  . ROBOT ASSISTED LAPAROSCOPIC RADICAL PROSTATECTOMY  05/20/2012   Procedure: ROBOTIC ASSISTED  LAPAROSCOPIC RADICAL PROSTATECTOMY LEVEL 1;  Surgeon: Dutch Gray, MD;  Location: [facility];  Service: Urology;  Laterality: N/A;     . TRANSURETHRAL RESECTION OF PROSTATE  32yr ago        Home Medications    Prior to Admission medications   Medication Sig Start Date End Date Taking? Authorizing Provider  amLODipine (NORVASC) 2.5 MG tablet Take 1 tablet (2.5 mg total) by mouth daily. Patient taking differently: Take 2.5 mg by mouth every evening.  08/31/17 01/19/18  KTroy Sine MD  ascorbic acid (VITAMIN C) 500 MG tablet Take 500 mg by mouth every evening.     [provider]  aspirin EC 81 MG tablet Take 1 tablet (81 mg total) by mouth daily. 01/19/18   KTroy Sine MD  bisoprolol (ZEBETA) 5 MG tablet Take 0.5 tablets (2.5 mg total) by mouth daily. Patient taking differently: Take 2.5 mg by mouth every morning.  08/31/17   KTroy Sine MD  budesonide-formoterol ([facility] 160-4.5 MCG/ACT inhaler Inhale 2 puffs into the lungs 2 (two) times daily as needed (for cough).     [provider]  colchicine 0.6 MG tablet Take 1 tablet (0.6 mg total) by mouth 2 (two) times daily. 12/30/17 03/31/18  AHosie Poisson MD  ezetimibe (ZETIA) 10 MG tablet Take 10 mg by mouth every evening.     [provider]  Multiple Vitamins-Minerals (MULTIVITAMIN PO) Take 1 tablet by mouth at bedtime.     [provider]  niacin (NIASPAN) 1000 MG CR tablet Take 1 tablet by mouth at bedtime.  12/04/15   [provider]  nitroGLYCERIN (NITROSTAT) 0.4 MG SL tablet Place 1 tablet (0.4 mg total) under the tongue every 5 (five) minutes x 3 doses as needed for chest pain. 06/15/17   HKalman ShanRatliff, DO  pantoprazole (PROTONIX) 40 MG tablet Take 40 mg by mouth 2 (two) times daily.  06/24/17   [provider]  pravastatin (PRAVACHOL) 80 MG tablet Take 80 mg by mouth daily.    [provider]  PROAIR HFA 108 (90 BASE) MCG/ACT inhaler Take 2 puffs by mouth 2  (two) times daily as needed. For shortness of breath. 02/13/12   [provider]  rivaroxaban (XARELTO) 20 MG TABS tablet Take 1 tablet (20 mg total) by mouth daily with supper. 08/31/17   KTroy Sine MD  Vitamin D, Ergocalciferol, 2000 units CAPS Take 1 capsule by mouth at bedtime.     [provider]    Family History Family History  Problem Relation Age of Onset  . Stroke Mother   . Heart disease Father   . Heart attack Father   . Cancer Sister   . Stroke Maternal Grandfather   . Cancer Paternal Grandmother   . Heart attack Paternal Grandfather   . Heart disease Paternal Grandfather   . Colon cancer Neg Hx   . Esophageal cancer Neg Hx   . Pancreatic cancer Neg Hx   . Rectal cancer Neg Hx   . Stomach  cancer Neg Hx     Social History Social History   Tobacco Use  . Smoking status: Never Smoker  . Smokeless tobacco: Never Used  Substance Use Topics  . Alcohol use: Yes    Alcohol/week: 2.0 standard drinks    Types: 2 Glasses of wine per week    Comment: occ. x 2 drinks weekly  . Drug use: No     Allergies   Gluten meal; Wheat bran; Statins; and Sulfa antibiotics   Review of Systems Review of Systems  All other systems reviewed and are negative.    Physical Exam Updated Vital Signs BP 120/89   Pulse 78   Temp 97.8 F (36.6 C) (Oral)   Resp 16   SpO2 99%   Physical Exam  Constitutional: He is oriented to person, place, and time. He appears well-developed and well-nourished. No distress.  HENT:  Head: Normocephalic and atraumatic.  Mouth/Throat: Oropharynx is clear and moist.  Eyes: Pupils are equal, round, and reactive to light. Conjunctivae and EOM are normal.  Neck: Normal range of motion. Neck supple.  Cardiovascular: Normal rate, regular rhythm and intact distal pulses.  No murmur heard. Pulmonary/Chest: Effort normal and breath sounds normal. No respiratory distress. He has no wheezes. He has no rales. He exhibits no  tenderness.  Abdominal: Soft. He exhibits no distension. There is tenderness in the epigastric area. There is no rebound and no guarding.  Musculoskeletal: Normal range of motion. He exhibits no edema or tenderness.  Neurological: He is alert and oriented to person, place, and time.  Skin: Skin is warm and dry. No rash noted. No erythema.  Psychiatric: He has a normal mood and affect. His behavior is normal.  Nursing note and vitals reviewed.    ED Treatments / Results  Labs (all labs ordered are listed, but only abnormal results are displayed) Labs Reviewed  BASIC METABOLIC PANEL - Abnormal; Notable for the following components:      Result Value   Glucose, Bld 111 (*)    All other components within normal limits  CBC - Abnormal; Notable for the following components:   RBC 5.85 (*)    All other components within normal limits  TROPONIN I  MAGNESIUM    EKG EKG Interpretation  Date/Time:  Friday February 19 2018 09:06:57 EDT Ventricular Rate:  102 PR Interval:    QRS Duration: 93 QT Interval:  354 QTC Calculation: 462 R Axis:   12 Text Interpretation:  recurrent Atrial fibrillation Confirmed by Blanchie Dessert [phone]) on 02/19/2018 10:21:03 AM   Radiology Dg Chest 2 View  Result Date: 02/19/2018 CLINICAL DATA:  Pt c/o a-fib since 7a, hx of same, denies any pain or SOB. States had a ECHO yesterday with no results at this time EXAM: CHEST - 2 VIEW COMPARISON:  12/28/2017 FINDINGS: Heart size is UPPER normal. The lungs are free of focal consolidations and pleural effusions. No pulmonary edema. IMPRESSION: No active cardiopulmonary disease. Electronically Signed   By: Nolon Nations M.D.   On: 02/19/2018 09:44    Procedures Procedures (including critical care time)  Medications Ordered in ED Medications - No data to display   Initial Impression / Assessment and Plan / ED Course  I have reviewed the triage vital signs and the nursing notes.  Pertinent labs & imaging  results that were available during my care of the patient were reviewed by me and considered in my medical decision making (see chart for details).     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  Patient's last episode of atrial fibrillation was in February.  He is currently anticoagulated with aspirin and Xarelto daily.  Patient did take an additional dose of his beta-blocker this morning and is currently rate controlled but remains in atrial fibrillation.  Vital signs are otherwise reassuring.  Patient is not symptomatic today.  Spoke with Dr. Claiborne Billings about patient's symptoms in case.  At this time there is no indication for immediate cardioversion.  Question whether the nidus of his atrial fibrillation is related to ongoing pericarditis.  Dr. Claiborne Billings recommended increasing Bisprolol from 2.5 to 5 mg and following up with atrial fibrillation clinic next week as Dr. Claiborne Billings is in the hospital and will not be in the office.  Patient's labs are reassuring with normal hemoglobin, white count, normal renal function, electrolytes and magnesium.  Chest x-ray without acute findings and troponin is negative.  Findings discussed with the patient and family and they are comfortable with this plan.  Pt will also hold ibuprofen due to epigastric discomfort and continue colchicine and use tylenol for pain.  CHA2DS2/VAS Stroke Risk Points  Current as of 12 minutes ago     3 >= 2 Points: High Risk  1 - 1.99 Points: Medium Risk  0 Points: Low Risk    The patient's score has not changed in the past year.:  No Change     Details    This score determines the patient's risk of having a  stroke if the  patient has atrial fibrillation.       Points Metrics  0 Has Congestive Heart Failure:  No    Current as of 12 minutes ago  1 Has Vascular Disease:  Yes    Current as of 12 minutes ago  1 Has Hypertension:  Yes    Current as of 12 minutes ago  1 Age:  6    Current as of 12 minutes ago  0 Has Diabetes:  No    Current as of 12 minutes ago  0 Had Stroke:  No  Had TIA:  No  Had thromboembolism:  No    Current as of 12 minutes ago  0 Male:  No    Current as of 12 minutes ago            Final Clinical Impressions(s) / ED Diagnoses   Final diagnoses:  Paroxysmal atrial fibrillation [facility])    ED Discharge Orders    None       Blanchie Dessert, MD 02/19/18 1106

## 2018-02-19 NOTE — Discharge Instructions (Signed)
If your heart rate remains in the 70s to 80s take 5 mg of your Bisoprolol.  When you wake up in the morning and your heart rate is 50 or 60 and is regular you can take your normal dose.  Hold the ibuprofen until you are seen in the atrial fibrillation clinic.  You can take Tylenol in addition to the colchicine if you need pain control.

## 2018-02-22 ENCOUNTER — Encounter (HOSPITAL_COMMUNITY): Payer: Self-pay | Admitting: Nurse Practitioner

## 2018-02-22 ENCOUNTER — Other Ambulatory Visit: Payer: Self-pay

## 2018-02-22 ENCOUNTER — Ambulatory Visit (HOSPITAL_COMMUNITY)
Admission: RE | Admit: 2018-02-22 | Discharge: 2018-02-22 | Disposition: A | Discharge status: Home / Self Care | Payer: Medicare Other | Source: Ambulatory Visit | Attending: Nurse Practitioner | Admitting: Nurse Practitioner

## 2018-02-22 VITALS — BP 122/78 | HR 82 | Ht 68.0 in | Wt 180.0 lb

## 2018-02-22 DIAGNOSIS — I2510 Atherosclerotic heart disease of native coronary artery without angina pectoris: Secondary | ICD-10-CM | POA: Insufficient documentation

## 2018-02-22 DIAGNOSIS — I4819 Other persistent atrial fibrillation: Secondary | ICD-10-CM

## 2018-02-22 DIAGNOSIS — I319 Disease of pericardium, unspecified: Secondary | ICD-10-CM | POA: Diagnosis not present

## 2018-02-22 DIAGNOSIS — Z7982 Long term (current) use of aspirin: Secondary | ICD-10-CM | POA: Diagnosis not present

## 2018-02-22 DIAGNOSIS — Z8249 Family history of ischemic heart disease and other diseases of the circulatory system: Secondary | ICD-10-CM | POA: Insufficient documentation

## 2018-02-22 DIAGNOSIS — Z882 Allergy status to sulfonamides status: Secondary | ICD-10-CM | POA: Diagnosis not present

## 2018-02-22 DIAGNOSIS — Z8546 Personal history of malignant neoplasm of prostate: Secondary | ICD-10-CM | POA: Diagnosis not present

## 2018-02-22 DIAGNOSIS — E785 Hyperlipidemia, unspecified: Secondary | ICD-10-CM | POA: Diagnosis not present

## 2018-02-22 DIAGNOSIS — I480 Paroxysmal atrial fibrillation: Secondary | ICD-10-CM | POA: Insufficient documentation

## 2018-02-22 DIAGNOSIS — Z888 Allergy status to other drugs, medicaments and biological substances status: Secondary | ICD-10-CM | POA: Diagnosis not present

## 2018-02-22 DIAGNOSIS — Z79899 Other long term (current) drug therapy: Secondary | ICD-10-CM | POA: Diagnosis not present

## 2018-02-22 DIAGNOSIS — Z823 Family history of stroke: Secondary | ICD-10-CM | POA: Diagnosis not present

## 2018-02-22 DIAGNOSIS — Z955 Presence of coronary angioplasty implant and graft: Secondary | ICD-10-CM | POA: Insufficient documentation

## 2018-02-22 DIAGNOSIS — K219 Gastro-esophageal reflux disease without esophagitis: Secondary | ICD-10-CM | POA: Insufficient documentation

## 2018-02-22 NOTE — Progress Notes (Signed)
Primary Care Physician: Derinda Late, MD Referring Physician: [place] F/u   Jose Warner is a 67 y.o. male with a h/o first onset  paroxysmal afib in 2014 following prostate surgery. Afib has been quiet since then. He was on eliquis for a period of time and then stopped for a low chadsvasc score.  He developed irregular heart beat 2/12 in the late afternoon and felt lightheaded and had some mild chest discomfort. He presented to the ER with an EKG showing afib at 119 bpm. He was cardioverted successfully and presents today with sinus brady at 48 bpm, not symptomatic with this and HR usually runs in the 50's. Trigger  may have been a recent URI treated with decongestants a week before. Also, the end of January he was switched form Bystolic 10 mg to bisoprolol due to cost. Denies snoring history, drinks one glass a wine a night, minimal caffeine.  Is active and not obese. He was started on xarelto 20 mg a day for chadsvasc score of 2  (age, cad).  F/u in afib clinic, for anther ER visit Friday, 11/1, for afib. He tells me since being seen here in early  February, after successful cardioversion, he was readmitted 2/19 with chest pain, had  a LHC and  had a stent placed in the LAD. He then had stuttering chest pain and had a repeat cath in April with CAD being stable and stent patent. He continued with chest pain and had amlodipine started by Dr. Claiborne Billings in April which helped to relieve the pain after that. He was recently seen in the  ER in  September  with chest pain thought to be 2/2 pericarditis. He was started on ibuprofen and colchicine. He then went back to the ER Friday with afib. He was told the ER physician did not want to cardiovert on top of pericarditis. Pt continues with chest discomfort but not as bad as when presented in September with chest pain. He continues on ibuprofen tid and colchicine. He does not feel that bad in afib but does notice  the difference in his heart rate  readings.   Today, he denies symptoms of palpitations, chest pain, shortness of breath, orthopnea, PND, lower extremity edema, dizziness, presyncope, syncope, or neurologic sequela. The patient is tolerating medications without difficulties and is otherwise without complaint today.   Past Medical History:  Diagnosis Date  . Anemia   . Atrial fibrillation ([place]) 06/05/2017  . Bronchitis 05-10-12   past fall- 4 runs antibiotics due to bronchitis-uses [employer] as needed  . Celiac disease   . Colon polyps    adenomatous  . Coronary artery disease   . Diverticulosis   . GERD (gastroesophageal reflux disease)   . Hepatitis 05-10-12   "was told non A, non B"-unclean dental equip.  Marland Kitchen Hyperlipidemia   . Iron deficiency anemia   . Prostate cancer ([place]) 05-10-12   ;bx. 04-12-12, dx  . Spasm of esophagus 2013   Past Surgical History:  Procedure Laterality Date  . CARDIAC CATHETERIZATION  09/13/2004   LAD:30%-40% in prox. to mid segment with 20% in the mid segment and 20% in left Circ., mild 20% right common iliac narrowing. medical therapy  . CARDIOVERSION  06/05/2017  . CORONARY STENT INTERVENTION N/A 06/11/2017   Procedure: CORONARY STENT INTERVENTION;  Surgeon: Belva Crome, MD;  Location: [facility];  Service: Cardiovascular;  Laterality: N/A;  . HERNIA REPAIR  5 yrs ago  right   . LEFT HEART CATH AND CORONARY ANGIOGRAPHY N/A 06/11/2017   Procedure: LEFT HEART CATH AND CORONARY ANGIOGRAPHY;  Surgeon: Belva Crome, MD;  Location: [facility];  Service: Cardiovascular;  Laterality: N/A;  . LEFT HEART CATH AND CORONARY ANGIOGRAPHY N/A 08/18/2017   Procedure: LEFT HEART CATH AND CORONARY ANGIOGRAPHY;  Surgeon: Martinique, Peter M, MD;  Location: [facility];  Service: Cardiovascular;  Laterality: N/A;  . NM MYOCAR PERF WALL MOTION  11/30/2007   protocol:Bruce, post EF 67%,mild perfusion defect seen in basal inferior consistant with Attenuation artifact, exercise cap. 12METS    . ROBOT ASSISTED LAPAROSCOPIC RADICAL PROSTATECTOMY  05/20/2012   Procedure: ROBOTIC ASSISTED LAPAROSCOPIC RADICAL PROSTATECTOMY LEVEL 1;  Surgeon: Dutch Gray, MD;  Location: [facility];  Service: Urology;  Laterality: N/A;     . TRANSURETHRAL RESECTION OF PROSTATE  81yrs ago    Current Outpatient Medications  Medication Sig Dispense Refill  . amLODipine (NORVASC) 2.5 MG tablet Take 1 tablet (2.5 mg total) by mouth daily. (Patient taking differently: Take 2.5 mg by mouth every evening. ) 90 tablet 3  . ascorbic acid (VITAMIN C) 500 MG tablet Take 500 mg by mouth every evening.     Marland Kitchen aspirin EC 81 MG tablet Take 1 tablet (81 mg total) by mouth daily. 90 tablet 3  . bisoprolol (ZEBETA) 5 MG tablet Take 5 mg by mouth daily.    . budesonide-formoterol (SYMBICORT) 160-4.5 MCG/ACT inhaler Inhale 2 puffs into the lungs 2 (two) times daily as needed (for cough).     . colchicine 0.6 MG tablet Take 1 tablet (0.6 mg total) by mouth 2 (two) times daily. 182 tablet 0  . ezetimibe (ZETIA) 10 MG tablet Take 10 mg by mouth every evening.     . Multiple Vitamins-Minerals (MULTIVITAMIN PO) Take 1 tablet by mouth at bedtime.     . niacin (NIASPAN) 1000 MG CR tablet Take 1 tablet by mouth at bedtime.     . nitroGLYCERIN (NITROSTAT) 0.4 MG SL tablet Place 1 tablet (0.4 mg total) under the tongue every 5 (five) minutes x 3 doses as needed for chest pain. 30 tablet 0  . pantoprazole (PROTONIX) 40 MG tablet Take 40 mg by mouth 2 (two) times daily.     . pravastatin (PRAVACHOL) 80 MG tablet Take 80 mg by mouth daily.    Marland Kitchen PROAIR HFA 108 (90 BASE) MCG/ACT inhaler Take 2 puffs by mouth 2 (two) times daily as needed. For shortness of breath.    . rivaroxaban (XARELTO) 20 MG TABS tablet Take 1 tablet (20 mg total) by mouth daily with supper. 90 tablet 3  . Vitamin D, Ergocalciferol, 2000 units CAPS Take 1 capsule by mouth at bedtime.      No current facility-administered medications for this encounter.     Allergies   Allergen Reactions  . Gluten Meal Other (See Comments)    Celiac, stomach crams , gas  . Wheat Bran Other (See Comments)    Celiac   . Statins     Leg muscle pains - rosuvastatin, atorvastatin, zetia. Tolerates simvastatin  . Sulfa Antibiotics Rash    Social History   Socioeconomic History  . Marital status: Married    Spouse name: Not on file  . Number of children: 3  . Years of education: Not on file  . Highest education level: Not on file  Occupational History    Employer: [employer]  . Financial resource strain:  Not on file  . Food insecurity:    Worry: Patient refused    Inability: Patient refused  . Transportation needs:    Medical: Patient refused    Non-medical: Patient refused  Tobacco Use  . Smoking status: Never Smoker  . Smokeless tobacco: Never Used  Substance and Sexual Activity  . Alcohol use: Yes    Alcohol/week: 2.0 standard drinks    Types: 2 Glasses of wine per week    Comment: occ. x 2 drinks weekly  . Drug use: No  . Sexual activity: Yes    Partners: Female  Lifestyle  . Physical activity:    Days per week: Not on file    Minutes per session: Not on file  . Stress: Not on file  Relationships  . Social connections:    Talks on phone: Patient refused    Gets together: Patient refused    Attends religious service: Patient refused    Active member of club or organization: Patient refused    Attends meetings of clubs or organizations: Patient refused    Relationship status: Patient refused  . Intimate partner violence:    Fear of current or ex partner: Patient refused    Emotionally abused: Patient refused    Physically abused: Patient refused    Forced sexual activity: Patient refused  Other Topics Concern  . Not on file  Social History Narrative  . Not on file    Family History  Problem Relation Age of Onset  . Stroke Mother   . Heart disease Father   . Heart attack Father   . Cancer Sister   . Stroke Maternal  Grandfather   . Cancer Paternal Grandmother   . Heart attack Paternal Grandfather   . Heart disease Paternal Grandfather   . Colon cancer Neg Hx   . Esophageal cancer Neg Hx   . Pancreatic cancer Neg Hx   . Rectal cancer Neg Hx   . Stomach cancer Neg Hx     ROS- All systems are reviewed and negative except as per the HPI above  Physical Exam: Vitals:   02/22/18 1117  BP: 122/78  Pulse: 82  Weight: 81.6 kg  Height: 5\' 8"  (1.727 m)   Wt Readings from Last 3 Encounters:  02/22/18 81.6 kg  01/19/18 78.4 kg  12/28/17 77.9 kg    Labs: Lab Results  Component Value Date   NA 140 02/19/2018   K 4.2 02/19/2018   CL 104 02/19/2018   CO2 26 02/19/2018   GLUCOSE 111 (H) 02/19/2018   BUN 14 02/19/2018   CREATININE 1.12 02/19/2018   CALCIUM 9.3 02/19/2018   PHOS 3.7 12/28/2017   MG 2.3 02/19/2018   Lab Results  Component Value Date   INR 1.18 06/11/2017   Lab Results  Component Value Date   CHOL 122 06/10/2017   HDL 41 06/10/2017   LDLCALC 60 06/10/2017   TRIG 103 06/10/2017     GEN- The patient is well appearing, alert and oriented x 3 today.   Head- normocephalic, atraumatic Eyes-  Sclera clear, conjunctiva pink Ears- hearing intact Oropharynx- clear Neck- supple, no JVP Lymph- no cervical lymphadenopathy Lungs- Clear to ausculation bilaterally, normal work of breathing Heart- irregular rate and rhythm, no murmurs, rubs or gallops, PMI not laterally displaced GI- soft, NT, ND, + BS Extremities- no clubbing, cyanosis, or edema MS- no significant deformity or atrophy Skin- no rash or lesion Psych- euthymic mood, full affect Neuro- strength and sensation are intact  EKG- Afib at 99 bpm,  qrs int 82 int, qtc, 438 ms Epic records reviewed Cardiac CT- 9/10-IMPRESSION: 1. Normal left ventricular size, thickness and hyperdynamic systolic function (LVEF = 71%). There are no regional wall motion abnormalities.  There is no late gadolinium enhancement in the left  ventricular myocardium.  2. Normal right ventricular size, thickness and systolic function (LVEF = 73%). There are no regional wall motion abnormalities.  3. Mildly dilated left atrium. There is atypical LGE of the left and right atrial walls suspicious for fibrosis.  4. Normal size of the aortic root, ascending aorta and pulmonary artery.  5.  Mild aortic and mitral and trivial tricuspid regurgitation.  6. No pericardial effusion. There is mild late gadolinium enhancement adjacent to the inferolateral and anterior left ventricular walls and RV free wall.  Collectively, these findings are consistent with an acute pericarditis. LVEF has improved when compared to the prior echocardiogram on 06/10/2017 from 50-55% to 71%.   Assessment and Plan: 1. Paroxysmal afib  Successfully cardioverted in 05/2017, did maintain SR until this past Friday, also has been dx recently with pericarditis 12/2017 and is still having chest discomfort, still being treated with ibuprofen and colchicine ER Physician this past Friday did not want to cardiovert with ongoing treatment of pericarditis  Continue bisoprolol 5 mg a day  Continue xarelto 20 mg daily for chadsvasc score of 2(age/CAD), states no missed doses  I am not sure of  appropriateness of cardioversion in light of pericarditis and will refer to Dr. Rayann Heman for best options/timimig  to restore SR  2. CAD Pt stastes current chest discomfort is not consistent with his anginal chest pain  3. Pericarditis Pain significantly improved since onset in September but not completley relieved Continue  tx with ibuprofen and colchicine Discussed that this could  increase risk of bleed He is taking PPI bid  Appointment requested with Dr. Rayann Heman  First available in  1-2 weeks  Butch Penny C. Sherrian Nunnelley, [facility] [address] [place], [place] [address] [phone]

## 2018-02-23 ENCOUNTER — Encounter (HOSPITAL_COMMUNITY): Payer: Self-pay | Attending: Cardiovascular Disease

## 2018-02-23 DIAGNOSIS — Z955 Presence of coronary angioplasty implant and graft: Secondary | ICD-10-CM | POA: Insufficient documentation

## 2018-02-23 DIAGNOSIS — Z48812 Encounter for surgical aftercare following surgery on the circulatory system: Secondary | ICD-10-CM | POA: Insufficient documentation

## 2018-02-24 ENCOUNTER — Encounter (HOSPITAL_COMMUNITY): Payer: Self-pay

## 2018-02-25 ENCOUNTER — Encounter (HOSPITAL_COMMUNITY): Payer: Self-pay

## 2018-03-02 ENCOUNTER — Encounter (HOSPITAL_COMMUNITY): Payer: Self-pay

## 2018-03-03 ENCOUNTER — Encounter (HOSPITAL_COMMUNITY): Payer: Self-pay

## 2018-03-04 ENCOUNTER — Encounter (HOSPITAL_COMMUNITY): Payer: Self-pay

## 2018-03-08 ENCOUNTER — Encounter: Payer: Self-pay | Admitting: Internal Medicine

## 2018-03-08 ENCOUNTER — Ambulatory Visit (INDEPENDENT_AMBULATORY_CARE_PROVIDER_SITE_OTHER): Payer: Medicare Other | Admitting: Internal Medicine

## 2018-03-08 VITALS — BP 122/68 | HR 50 | Ht 68.0 in | Wt 174.6 lb

## 2018-03-08 DIAGNOSIS — I200 Unstable angina: Secondary | ICD-10-CM

## 2018-03-08 DIAGNOSIS — I319 Disease of pericardium, unspecified: Secondary | ICD-10-CM | POA: Diagnosis not present

## 2018-03-08 DIAGNOSIS — I480 Paroxysmal atrial fibrillation: Secondary | ICD-10-CM | POA: Diagnosis not present

## 2018-03-08 NOTE — Patient Instructions (Addendum)
Medication Instructions:  Your physician recommends that you continue on your current medications as directed. Please refer to the Current Medication list given to you today.  Labwork: None ordered.  Testing/Procedures: None ordered.  Follow-Up:  Next available with Dr. Claiborne Billings  Your physician wants you to follow-up in: 3 months with Afib clinic  Any Other Special Instructions Will Be Listed Below (If Applicable).  If you need a refill on your cardiac medications before your next appointment, please call your pharmacy.

## 2018-03-08 NOTE — Progress Notes (Signed)
Electrophysiology Office Note   Date:  03/08/2018   ID:  Warner, Jose 09/02/1950, MRN [id number]  PCP:  Derinda Late, MD    Primary Electrophysiologist: Jose Grayer, MD    CC: afib   History of Present Illness: Jose Warner is a 67 y.o. male who presents today for electrophysiology evaluation.   She is referred Jose Warner and the AF clinic Jose Palau NP) for EP consultation regarding afib.  He reports initially having afib in 2014.  He did very well until February of this year when he had additional afib with RVR. He underwent cardioversion at that time.  He had PCI of LAD by Jose Warner as well.  He had another ER visit 02/19/18.  He has had further afib in the setting of pericarditis.  He is on xarelto for stroke prevention.   He reports that his afib lasted for 6 days but spontaneously converted to sinus.  He has been in sinus every since.  His pericarditis pain is better.  Today, he denies symptoms of palpitations, chest pain, shortness of breath, orthopnea, PND, lower extremity edema, claudication, dizziness, presyncope, syncope, bleeding, or neurologic sequela. The patient is tolerating medications without difficulties and is otherwise without complaint today.    Past Medical History:  Diagnosis Date  . Anemia   . Bronchitis 05-10-12   past fall- 4 runs antibiotics due to bronchitis-uses [employer] as needed  . Celiac disease   . Colon polyps    adenomatous  . Coronary artery disease   . Diverticulosis   . GERD (gastroesophageal reflux disease)   . Hepatitis 05-10-12   "was told non A, non B"-unclean dental equip.  Marland Kitchen Hyperlipidemia   . Iron deficiency anemia   . Pericarditis   . Persistent atrial fibrillation 06/05/2017  . Prostate cancer ([place]) 05-10-12   ;bx. 04-12-12, dx  . Spasm of esophagus 2013   Past Surgical History:  Procedure Laterality Date  . CARDIAC CATHETERIZATION  09/13/2004   LAD:30%-40% in prox. to mid segment with 20% in the mid segment  and 20% in left Circ., mild 20% right common iliac narrowing. medical therapy  . CARDIOVERSION  06/05/2017  . CORONARY STENT INTERVENTION N/A 06/11/2017   Procedure: CORONARY STENT INTERVENTION;  Surgeon: Jose Crome, MD;  Location: [facility];  Service: Cardiovascular;  Laterality: N/A;  . HERNIA REPAIR  5 yrs ago   right   . LEFT HEART CATH AND CORONARY ANGIOGRAPHY N/A 06/11/2017   Procedure: LEFT HEART CATH AND CORONARY ANGIOGRAPHY;  Surgeon: Jose Crome, MD;  Location: [facility];  Service: Cardiovascular;  Laterality: N/A;  . LEFT HEART CATH AND CORONARY ANGIOGRAPHY N/A 08/18/2017   Procedure: LEFT HEART CATH AND CORONARY ANGIOGRAPHY;  Surgeon: Warner, Jose M, MD;  Location: [facility];  Service: Cardiovascular;  Laterality: N/A;  . NM MYOCAR PERF WALL MOTION  11/30/2007   protocol:Bruce, post EF 67%,mild perfusion defect seen in basal inferior consistant with Attenuation artifact, exercise cap. 12METS  . ROBOT ASSISTED LAPAROSCOPIC RADICAL PROSTATECTOMY  05/20/2012   Procedure: ROBOTIC ASSISTED LAPAROSCOPIC RADICAL PROSTATECTOMY LEVEL 1;  Surgeon: Jose Gray, MD;  Location: [facility];  Service: Urology;  Laterality: N/A;     . TRANSURETHRAL RESECTION OF PROSTATE  22yr ago     Current Outpatient Medications  Medication Sig Dispense Refill  . ascorbic acid (VITAMIN C) 500 MG tablet Take 500 mg by mouth every evening.     .Marland Kitchenaspirin EC  81 MG tablet Take 1 tablet (81 mg total) by mouth daily. 90 tablet 3  . bisoprolol (ZEBETA) 5 MG tablet Take 5 mg by mouth daily.    . budesonide-formoterol (SYMBICORT) 160-4.5 MCG/ACT inhaler Inhale 2 puffs into the lungs 2 (two) times daily as needed (for cough).     . colchicine 0.6 MG tablet Take 1 tablet (0.6 mg total) by mouth 2 (two) times daily. 182 tablet 0  . ezetimibe (ZETIA) 10 MG tablet Take 10 mg by mouth every evening.     . Multiple Vitamins-Minerals (MULTIVITAMIN PO) Take 1 tablet by mouth at bedtime.     . niacin  (NIASPAN) 1000 MG CR tablet Take 1 tablet by mouth at bedtime.     . nitroGLYCERIN (NITROSTAT) 0.4 MG SL tablet Place 1 tablet (0.4 mg total) under the tongue every 5 (five) minutes x 3 doses as needed for chest pain. 30 tablet 0  . pantoprazole (PROTONIX) 40 MG tablet Take 40 mg by mouth 2 (two) times daily.     . pravastatin (PRAVACHOL) 80 MG tablet Take 80 mg by mouth daily.    Marland Kitchen PROAIR HFA 108 (90 BASE) MCG/ACT inhaler Take 2 puffs by mouth 2 (two) times daily as needed. For shortness of breath.    . rivaroxaban (XARELTO) 20 MG TABS tablet Take 1 tablet (20 mg total) by mouth daily with supper. 90 tablet 3  . Vitamin D, Ergocalciferol, 2000 units CAPS Take 1 capsule by mouth at bedtime.     Marland Kitchen amLODipine (NORVASC) 2.5 MG tablet Take 1 tablet (2.5 mg total) by mouth daily. (Patient taking differently: Take 2.5 mg by mouth every evening. ) 90 tablet 3   No current facility-administered medications for this visit.     Allergies:   Gluten meal; Wheat bran; Statins; and Sulfa antibiotics   Social History:  The patient  reports that he has never smoked. He has never used smokeless tobacco. He reports that he drinks about 2.0 standard drinks of alcohol per week. He reports that he does not use drugs.   Family History:  The patient's family history includes Cancer in his paternal grandmother and sister; Heart attack in his father and paternal grandfather; Heart disease in his father and paternal grandfather; Stroke in his maternal grandfather and mother.    ROS:  Please see the history of present illness.   All other systems are personally reviewed and negative.    PHYSICAL EXAM: VS:  BP 122/68   Pulse (!) 50   Ht 5' 8"  (1.727 m)   Wt 174 lb 9.6 oz (79.2 kg)   SpO2 99%   BMI 26.55 kg/m  , BMI Body mass index is 26.55 kg/m. GEN: Well nourished, well developed, in no acute distress  HEENT: normal  Neck: no JVD, carotid bruits, or masses Cardiac: iRRR; no murmurs, rubs, or gallops,no edema    Respiratory:  clear to auscultation bilaterally, normal work of breathing GI: soft, nontender, nondistended, + BS MS: no deformity or atrophy  Skin: warm and dry  Neuro:  Strength and sensation are intact Psych: euthymic mood, full affect  EKG:  EKG is ordered today. The ekg ordered today is personally reviewed and shows sinus bradycardia 50 bpm, PR 162 msec, QRS 90 msec, Qtc 404 msec   Recent Labs: 06/12/2017: ALT 26 02/19/2018: BUN 14; Creatinine, Ser 1.12; Hemoglobin 16.3; Magnesium 2.3; Platelets 215; Potassium 4.2; Sodium 140  personally reviewed   Lipid Panel     Component Value Date/Time  CHOL 122 06/10/2017 0108   TRIG 103 06/10/2017 0108   HDL 41 06/10/2017 0108   CHOLHDL 3.0 06/10/2017 0108   VLDL 21 06/10/2017 0108   LDLCALC 60 06/10/2017 0108   personally reviewed   Wt Readings from Last 3 Encounters:  03/08/18 174 lb 9.6 oz (79.2 kg)  02/22/18 180 lb (81.6 kg)  01/19/18 172 lb 12.8 oz (78.4 kg)      Other studies personally reviewed: Additional studies/ records that were reviewed today include: AF clinic notes, prior echo  Review of the above records today demonstrates: as above   ASSESSMENT AND PLAN:  1.  Persistent afib Anticoagulated with xarelto Recent episode secondary to pericarditis He spontaneously converted to sinus Consider multaq, tikosyn, or amiodarone should his AF recur Would advise medical therapy prior to consideration of ablation  2. CAD/ pericarditis No ischemic symptoms Needs to follow closely with Jose Warner  Follow-up:  Jose Warner and AF clinic I will see as needed  Current medicines are reviewed at length with the patient today.   The patient does not have concerns regarding his medicines.  The following changes were made today:  none  Labs/ tests ordered today include:  Orders Placed This Encounter  Procedures  . EKG 12-Lead     Signed, Jose Grayer, MD  03/08/2018 3:53 PM     [place] [address] [phone] (office) [phone] (fax)

## 2018-03-09 ENCOUNTER — Encounter (HOSPITAL_COMMUNITY): Payer: Self-pay

## 2018-03-10 ENCOUNTER — Encounter (HOSPITAL_COMMUNITY): Payer: Self-pay

## 2018-03-11 ENCOUNTER — Encounter (HOSPITAL_COMMUNITY): Payer: Self-pay

## 2018-03-16 ENCOUNTER — Encounter (HOSPITAL_COMMUNITY): Payer: Self-pay

## 2018-03-17 ENCOUNTER — Encounter (HOSPITAL_COMMUNITY): Payer: Self-pay

## 2018-03-22 ENCOUNTER — Other Ambulatory Visit (HOSPITAL_COMMUNITY): Payer: Medicare Other

## 2018-03-23 ENCOUNTER — Encounter (HOSPITAL_COMMUNITY)
Admission: RE | Admit: 2018-03-23 | Discharge: 2018-03-23 | Disposition: A | Discharge status: Home / Self Care | Payer: Self-pay | Source: Ambulatory Visit | Attending: Cardiovascular Disease | Admitting: Cardiovascular Disease

## 2018-03-23 DIAGNOSIS — Z955 Presence of coronary angioplasty implant and graft: Secondary | ICD-10-CM | POA: Insufficient documentation

## 2018-03-23 DIAGNOSIS — Z48812 Encounter for surgical aftercare following surgery on the circulatory system: Secondary | ICD-10-CM | POA: Insufficient documentation

## 2018-03-24 ENCOUNTER — Encounter (HOSPITAL_COMMUNITY)
Admission: RE | Admit: 2018-03-24 | Discharge: 2018-03-24 | Disposition: A | Discharge status: Home / Self Care | Payer: Self-pay | Source: Ambulatory Visit | Attending: Cardiovascular Disease | Admitting: Cardiovascular Disease

## 2018-03-25 ENCOUNTER — Encounter (HOSPITAL_COMMUNITY)
Admission: RE | Admit: 2018-03-25 | Discharge: 2018-03-25 | Disposition: A | Discharge status: Home / Self Care | Payer: Self-pay | Source: Ambulatory Visit | Attending: Cardiovascular Disease | Admitting: Cardiovascular Disease

## 2018-03-30 ENCOUNTER — Encounter (HOSPITAL_COMMUNITY)
Admission: RE | Admit: 2018-03-30 | Discharge: 2018-03-30 | Disposition: A | Discharge status: Home / Self Care | Payer: Medicare Other | Source: Ambulatory Visit | Attending: Cardiovascular Disease | Admitting: Cardiovascular Disease

## 2018-03-31 ENCOUNTER — Encounter (HOSPITAL_COMMUNITY)
Admission: RE | Admit: 2018-03-31 | Discharge: 2018-03-31 | Disposition: A | Discharge status: Home / Self Care | Payer: Self-pay | Source: Ambulatory Visit | Attending: Cardiovascular Disease | Admitting: Cardiovascular Disease

## 2018-03-31 NOTE — Progress Notes (Signed)
Patient in attendance for his 8:15am cardiac rehab maintenance exercise class. Patient reported experiencing Five "jabs" in chest during third station of exercise-track walking. He had completed nu-step and recumbent bike exercises having slightly increased the intensity on both machines today. He reported usually experiencing up to 3 "jabs" a day, but this exercise session had 5 jabs within about a 5- minute period. He sat down after having the 5th one saying, "it got my attention". He reported the pain is the usual sensation he has been experiencing with pericarditis but not as many jabs as this morning.  He felt much better with the rest period. BP taken during the rest period was 98/60 HR 72 regular Pain rating was a "4" on a 0/10 pain scale. He completed the session with gentle stretches and a period of relaxation. Exit BP 98/58 HR 58 with no further symptoms/ no further pain.

## 2018-04-01 ENCOUNTER — Encounter (HOSPITAL_COMMUNITY)
Admission: RE | Admit: 2018-04-01 | Discharge: 2018-04-01 | Disposition: A | Discharge status: Home / Self Care | Payer: Self-pay | Source: Ambulatory Visit | Attending: Cardiovascular Disease | Admitting: Cardiovascular Disease

## 2018-04-06 ENCOUNTER — Encounter (HOSPITAL_COMMUNITY)
Admission: RE | Admit: 2018-04-06 | Discharge: 2018-04-06 | Disposition: A | Discharge status: Home / Self Care | Payer: Self-pay | Source: Ambulatory Visit | Attending: Cardiovascular Disease | Admitting: Cardiovascular Disease

## 2018-04-07 ENCOUNTER — Encounter (HOSPITAL_COMMUNITY): Payer: Self-pay

## 2018-04-08 ENCOUNTER — Encounter (HOSPITAL_COMMUNITY)
Admission: RE | Admit: 2018-04-08 | Discharge: 2018-04-08 | Disposition: A | Discharge status: Home / Self Care | Payer: Self-pay | Source: Ambulatory Visit | Attending: Cardiovascular Disease | Admitting: Cardiovascular Disease

## 2018-04-13 ENCOUNTER — Encounter (HOSPITAL_COMMUNITY): Payer: Self-pay

## 2018-04-15 ENCOUNTER — Encounter (HOSPITAL_COMMUNITY): Payer: Self-pay

## 2018-04-20 ENCOUNTER — Encounter (HOSPITAL_COMMUNITY)
Admission: RE | Admit: 2018-04-20 | Discharge: 2018-04-20 | Disposition: A | Discharge status: Home / Self Care | Payer: Self-pay | Source: Ambulatory Visit | Attending: Cardiovascular Disease | Admitting: Cardiovascular Disease

## 2018-04-22 ENCOUNTER — Encounter (HOSPITAL_COMMUNITY): Payer: Self-pay

## 2018-04-22 DIAGNOSIS — Z955 Presence of coronary angioplasty implant and graft: Secondary | ICD-10-CM | POA: Insufficient documentation

## 2018-04-26 ENCOUNTER — Other Ambulatory Visit: Payer: Self-pay | Admitting: Family Medicine

## 2018-04-26 DIAGNOSIS — R0689 Other abnormalities of breathing: Secondary | ICD-10-CM

## 2018-04-26 DIAGNOSIS — R0789 Other chest pain: Secondary | ICD-10-CM

## 2018-04-26 DIAGNOSIS — R0602 Shortness of breath: Secondary | ICD-10-CM

## 2018-04-27 ENCOUNTER — Encounter (HOSPITAL_COMMUNITY)
Admission: RE | Admit: 2018-04-27 | Discharge: 2018-04-27 | Disposition: A | Discharge status: Home / Self Care | Payer: Self-pay | Source: Ambulatory Visit | Attending: Cardiovascular Disease | Admitting: Cardiovascular Disease

## 2018-04-28 ENCOUNTER — Encounter (HOSPITAL_COMMUNITY): Payer: Self-pay

## 2018-04-29 ENCOUNTER — Encounter (HOSPITAL_COMMUNITY): Payer: Self-pay

## 2018-04-29 ENCOUNTER — Ambulatory Visit
Admission: RE | Admit: 2018-04-29 | Discharge: 2018-04-29 | Disposition: A | Discharge status: Home / Self Care | Payer: Medicare Other | Source: Ambulatory Visit | Attending: Family Medicine | Admitting: Family Medicine

## 2018-04-29 DIAGNOSIS — R0602 Shortness of breath: Secondary | ICD-10-CM

## 2018-04-29 DIAGNOSIS — R0789 Other chest pain: Secondary | ICD-10-CM

## 2018-04-29 DIAGNOSIS — R0689 Other abnormalities of breathing: Secondary | ICD-10-CM

## 2018-05-04 ENCOUNTER — Encounter (HOSPITAL_COMMUNITY)
Admission: RE | Admit: 2018-05-04 | Discharge: 2018-05-04 | Disposition: A | Discharge status: Home / Self Care | Payer: Self-pay | Source: Ambulatory Visit | Attending: Cardiovascular Disease | Admitting: Cardiovascular Disease

## 2018-05-05 ENCOUNTER — Encounter (HOSPITAL_COMMUNITY)
Admission: RE | Admit: 2018-05-05 | Discharge: 2018-05-05 | Disposition: A | Discharge status: Home / Self Care | Payer: Self-pay | Source: Ambulatory Visit | Attending: Cardiovascular Disease | Admitting: Cardiovascular Disease

## 2018-05-06 ENCOUNTER — Encounter (HOSPITAL_COMMUNITY): Payer: Self-pay

## 2018-05-11 ENCOUNTER — Encounter (HOSPITAL_COMMUNITY)
Admission: RE | Admit: 2018-05-11 | Discharge: 2018-05-11 | Disposition: A | Discharge status: Home / Self Care | Payer: Self-pay | Source: Ambulatory Visit | Attending: Cardiovascular Disease | Admitting: Cardiovascular Disease

## 2018-05-12 ENCOUNTER — Encounter (HOSPITAL_COMMUNITY)
Admission: RE | Admit: 2018-05-12 | Discharge: 2018-05-12 | Disposition: A | Discharge status: Home / Self Care | Payer: Self-pay | Source: Ambulatory Visit | Attending: Cardiovascular Disease | Admitting: Cardiovascular Disease

## 2018-05-13 ENCOUNTER — Encounter (HOSPITAL_COMMUNITY): Payer: Self-pay

## 2018-05-18 ENCOUNTER — Encounter (HOSPITAL_COMMUNITY)
Admission: RE | Admit: 2018-05-18 | Discharge: 2018-05-18 | Disposition: A | Discharge status: Home / Self Care | Payer: Self-pay | Source: Ambulatory Visit | Attending: Cardiovascular Disease | Admitting: Cardiovascular Disease

## 2018-05-19 ENCOUNTER — Encounter (HOSPITAL_COMMUNITY)
Admission: RE | Admit: 2018-05-19 | Discharge: 2018-05-19 | Disposition: A | Discharge status: Home / Self Care | Payer: Self-pay | Source: Ambulatory Visit | Attending: Cardiovascular Disease | Admitting: Cardiovascular Disease

## 2018-05-20 ENCOUNTER — Encounter (HOSPITAL_COMMUNITY): Payer: Self-pay

## 2018-05-25 ENCOUNTER — Encounter (HOSPITAL_COMMUNITY)
Admission: RE | Admit: 2018-05-25 | Discharge: 2018-05-25 | Disposition: A | Discharge status: Home / Self Care | Payer: Medicare Other | Source: Ambulatory Visit | Attending: Cardiovascular Disease | Admitting: Cardiovascular Disease

## 2018-05-25 DIAGNOSIS — Z955 Presence of coronary angioplasty implant and graft: Secondary | ICD-10-CM | POA: Insufficient documentation

## 2018-05-26 ENCOUNTER — Encounter (HOSPITAL_COMMUNITY)
Admission: RE | Admit: 2018-05-26 | Discharge: 2018-05-26 | Disposition: A | Discharge status: Home / Self Care | Payer: Self-pay | Source: Ambulatory Visit | Attending: Cardiovascular Disease | Admitting: Cardiovascular Disease

## 2018-05-27 ENCOUNTER — Encounter (HOSPITAL_COMMUNITY)
Admission: RE | Admit: 2018-05-27 | Discharge: 2018-05-27 | Disposition: A | Discharge status: Home / Self Care | Payer: Medicare Other | Source: Ambulatory Visit | Attending: Cardiovascular Disease | Admitting: Cardiovascular Disease

## 2018-05-28 ENCOUNTER — Telehealth: Payer: Self-pay

## 2018-05-28 ENCOUNTER — Encounter: Payer: Self-pay | Admitting: Nurse Practitioner

## 2018-05-28 ENCOUNTER — Ambulatory Visit (INDEPENDENT_AMBULATORY_CARE_PROVIDER_SITE_OTHER): Payer: Medicare Other | Admitting: Nurse Practitioner

## 2018-05-28 VITALS — BP 90/50 | HR 54 | Ht 68.0 in | Wt 177.0 lb

## 2018-05-28 DIAGNOSIS — Z86010 Personal history of colonic polyps: Secondary | ICD-10-CM | POA: Diagnosis not present

## 2018-05-28 DIAGNOSIS — Z7901 Long term (current) use of anticoagulants: Secondary | ICD-10-CM

## 2018-05-28 MED ORDER — NA SULFATE-K SULFATE-MG SULF 17.5-3.13-1.6 GM/177ML PO SOLN: ORAL | 0 refills | Status: DC

## 2018-05-28 NOTE — Progress Notes (Signed)
ASSESSMENT / PLAN:   12.  68 year old male with history of adenomatous colon polyps due for surveillance colonoscopy. No GI complaints.  -The risks and benefits of colonoscopy with possible polypectomy were discussed and the patient agrees to proceed.  -Patient is on Xarelto, see #2  2.  Atrial fibrillation, status post cardioversion last year.  On Xarelto. -Hold Xarelto for 2 days before procedure - will instruct when and how to resume after procedure. Patient understands that there is a low but real risk of cardiovascular event such as heart attack, stroke, or embolism /  thrombosis while off blood thinner. The patient consents to proceed. Will communicate by phone or EMR with patient's prescribing provider to confirm that holding Xarelto is reasonable in this case.  3.  History of pericarditis September 2019, treated with colchicine.  He has occasional mild chest discomfort, overall much better -I will send a note to Cardiology, Dr. Claiborne Billings, to make sure he has no concerns about patient proceeding with surveillance colonoscopy at this time  4. Celiac Disease, not problems with diarrhea at present. Weight stable.    HPI:    Chief Complaint:   Colon cancer screening, history of polyps  Patient is a 68 year old male with history of prostate cancer status post prostatectomy , CAD / atrial fibrillation, on Xarelto. He is known to Dr. Henrene Pastor for a history of celiac disease as well as a history of adenomatous colon polyps.  Patient is overdue for surveillance colonoscopy.  Procedure had to be postponed due to cardiac reasons as he was hospitalized in September 2019 for acute pericarditis treated with Colchicine.  Echocardiogram 02/17/18 with EVEF of 55-60%.  Jose Warner comes in today to get scheduled for surveillance colonoscopy.  He has no GI complaints.  Specifically, no bowel changes, blood in stool, abdominal pain.  He sometimes gets a " catch" in left side near the bottom of the  rib cage.  The discomfort is transient, random and has no relationship to movement or eating.  Data Reviewed:  Labs 02/19/2017 CBC, BMET unremarkable   Past Medical History:  Diagnosis Date  . Anemia   . Bronchitis 05-10-12   past fall- 4 runs antibiotics due to bronchitis-uses [employer] as needed  . Celiac disease   . Colon polyps    adenomatous  . Coronary artery disease   . Diverticulosis   . GERD (gastroesophageal reflux disease)   . Hepatitis 05-10-12   "was told non A, non B"-unclean dental equip.  Marland Kitchen Hyperlipidemia   . Iron deficiency anemia   . Pericarditis   . Persistent atrial fibrillation 06/05/2017  . Prostate cancer ([place]) 05-10-12   ;bx. 04-12-12, dx  . Spasm of esophagus 2013     Past Surgical History:  Procedure Laterality Date  . CARDIAC CATHETERIZATION  09/13/2004   LAD:30%-40% in prox. to mid segment with 20% in the mid segment and 20% in left Circ., mild 20% right common iliac narrowing. medical therapy  . CARDIOVERSION  06/05/2017  . CORONARY STENT INTERVENTION N/A 06/11/2017   Procedure: CORONARY STENT INTERVENTION;  Surgeon: Belva Crome, MD;  Location: [facility];  Service: Cardiovascular;  Laterality: N/A;  . HERNIA REPAIR  5 yrs ago   right   . LEFT HEART CATH AND CORONARY ANGIOGRAPHY N/A 06/11/2017   Procedure: LEFT HEART CATH AND CORONARY ANGIOGRAPHY;  Surgeon: Belva Crome, MD;  Location: [facility];  Service: Cardiovascular;  Laterality: N/A;  . LEFT HEART CATH AND CORONARY ANGIOGRAPHY N/A 08/18/2017   Procedure: LEFT HEART CATH AND CORONARY ANGIOGRAPHY;  Surgeon: Martinique, Peter M, MD;  Location: [facility];  Service: Cardiovascular;  Laterality: N/A;  . NM MYOCAR PERF WALL MOTION  11/30/2007   protocol:Bruce, post EF 67%,mild perfusion defect seen in basal inferior consistant with Attenuation artifact, exercise cap. 12METS  . ROBOT ASSISTED LAPAROSCOPIC RADICAL PROSTATECTOMY  05/20/2012   Procedure: ROBOTIC ASSISTED  LAPAROSCOPIC RADICAL PROSTATECTOMY LEVEL 1;  Surgeon: Dutch Gray, MD;  Location: [facility];  Service: Urology;  Laterality: N/A;     . TRANSURETHRAL RESECTION OF PROSTATE  26yrs ago   Family History  Problem Relation Age of Onset  . Stroke Mother   . Heart disease Father   . Heart attack Father   . Cancer Sister   . Stroke Maternal Grandfather   . Cancer Paternal Grandmother   . Heart attack Paternal Grandfather   . Heart disease Paternal Grandfather   . Colon cancer Neg Hx   . Esophageal cancer Neg Hx   . Pancreatic cancer Neg Hx   . Rectal cancer Neg Hx   . Stomach cancer Neg Hx    Social History   Tobacco Use  . Smoking status: Never Smoker  . Smokeless tobacco: Never Used  Substance Use Topics  . Alcohol use: Yes    Alcohol/week: 2.0 standard drinks    Types: 2 Glasses of wine per week    Comment: occ. x 2 drinks weekly  . Drug use: No   Current Outpatient Medications  Medication Sig Dispense Refill  . amLODipine (NORVASC) 2.5 MG tablet Take 1 tablet (2.5 mg total) by mouth daily. (Patient taking differently: Take 2.5 mg by mouth every evening. ) 90 tablet 3  . ascorbic acid (VITAMIN C) 500 MG tablet Take 500 mg by mouth every evening.     Marland Kitchen aspirin EC 81 MG tablet Take 1 tablet (81 mg total) by mouth daily. 90 tablet 3  . bisoprolol (ZEBETA) 5 MG tablet Take 5 mg by mouth daily.    . budesonide-formoterol (SYMBICORT) 160-4.5 MCG/ACT inhaler Inhale 2 puffs into the lungs 2 (two) times daily as needed (for cough).     . colchicine 0.6 MG tablet Take 1 tablet (0.6 mg total) by mouth 2 (two) times daily. 182 tablet 0  . ezetimibe (ZETIA) 10 MG tablet Take 10 mg by mouth every evening.     . Multiple Vitamins-Minerals (MULTIVITAMIN PO) Take 1 tablet by mouth at bedtime.     . niacin (NIASPAN) 1000 MG CR tablet Take 1 tablet by mouth at bedtime.     . nitroGLYCERIN (NITROSTAT) 0.4 MG SL tablet Place 1 tablet (0.4 mg total) under the tongue every 5 (five) minutes x 3 doses as  needed for chest pain. 30 tablet 0  . pantoprazole (PROTONIX) 40 MG tablet Take 40 mg by mouth 2 (two) times daily.     . pravastatin (PRAVACHOL) 80 MG tablet Take 80 mg by mouth daily.    Marland Kitchen PROAIR HFA 108 (90 BASE) MCG/ACT inhaler Take 2 puffs by mouth 2 (two) times daily as needed. For shortness of breath.    . rivaroxaban (XARELTO) 20 MG TABS tablet Take 1 tablet (20 mg total) by mouth daily with supper. 90 tablet 3  . Vitamin D, Ergocalciferol, 2000 units CAPS Take 1 capsule by mouth at bedtime.      No current facility-administered medications  for this visit.    Allergies  Allergen Reactions  . Gluten Meal Other (See Comments)    Celiac, stomach crams , gas  . Wheat Bran Other (See Comments)    Celiac   . Statins     Leg muscle pains - rosuvastatin, atorvastatin, zetia. Tolerates simvastatin  . Sulfa Antibiotics Rash     Review of Systems: All systems reviewed and negative except where noted in HPI.   Creatinine clearance cannot be calculated (Patient's most recent lab result is older than the maximum 21 days allowed.)   Physical Exam:    Wt Readings from Last 3 Encounters:  05/28/18 177 lb (80.3 kg)  03/08/18 174 lb 9.6 oz (79.2 kg)  02/22/18 180 lb (81.6 kg)    BP (!) 90/50   Pulse (!) 54   Ht 5\' 8"  (1.727 m)   Wt 177 lb (80.3 kg)   BMI 26.91 kg/m  Constitutional:  Pleasant male in no acute distress. Psychiatric: Normal mood and affect. Behavior is normal. EENT: Pupils normal.  Conjunctivae are normal. No scleral icterus. Neck supple.  Cardiovascular: Normal rate, regular rhythm. No edema Pulmonary/chest: Effort normal and breath sounds normal. No wheezing, rales or rhonchi. Abdominal: Soft, nondistended, nontender. Bowel sounds active throughout. There are no masses palpable. No hepatomegaly. Neurological: Alert and oriented to person place and time. Skin: Skin is warm and dry. No rashes noted.  Tye Savoy, NP  05/28/2018, 8:59 AM  Cc: Derinda Late,  MD

## 2018-05-28 NOTE — Telephone Encounter (Signed)
[facility] Pre-operative Risk Assessment     Request for surgical clearance:     Endoscopy Procedure  What type of surgery is being performed?     06/17/18  When is this surgery scheduled?     Colonoscopy  What type of clearance is required ?   Pharmacy  Are there any medications that need to be held prior to surgery and how long? [place] TWO DAYS PRIOR  Practice name and name of physician performing surgery?      [facility]/Dr. Henrene Pastor  What is your office phone and fax number?      Phone- [phone]  [phone]  Anesthesia type (None, local, MAC, general) ?       MAC   PLEASE ADVISE ALSO IF ANY CONCERNS REGARDING PERICARDITIS. [place]

## 2018-05-28 NOTE — Patient Instructions (Signed)
If you are age 67 or older, your body mass index should be between 23-30. Your Body mass index is 26.91 kg/m. If this is out of the aforementioned range listed, please consider follow up with your Primary Care Provider.  If you are age 78 or younger, your body mass index should be between 19-25. Your Body mass index is 26.91 kg/m. If this is out of the aformentioned range listed, please consider follow up with your Primary Care Provider.   You have been scheduled for a colonoscopy. Please follow written instructions given to you at your visit today.  Please pick up your prep supplies at the pharmacy within the next 1-3 days. If you use inhalers (even only as needed), please bring them with you on the day of your procedure. Your physician has requested that you go to www.startemmi.com and enter the access code given to you at your visit today. This web site gives a general overview about your procedure. However, you should still follow specific instructions given to you by our office regarding your preparation for the procedure.  We have sent the following medications to your pharmacy for you to pick up at your convenience: [place] will be contacted by our office prior to your procedure for directions on holding your Xarelto.  If you do not hear from our office 1 week prior to your scheduled procedure, please call [phone] to discuss.   Thank you for choosing me and [facility].   Tye Savoy, NP

## 2018-05-28 NOTE — Progress Notes (Signed)
Assessment and plan reviewed 

## 2018-05-28 NOTE — Telephone Encounter (Signed)
Patient takes Xarelto for afib with CHADS2VASc score of 2 (age, CAD). Renal function is normal. Ok to hold Xarelto for 2 days prior to procedure as requested.

## 2018-06-01 ENCOUNTER — Encounter (HOSPITAL_COMMUNITY)
Admission: RE | Admit: 2018-06-01 | Discharge: 2018-06-01 | Disposition: A | Discharge status: Home / Self Care | Payer: Medicare Other | Source: Ambulatory Visit | Attending: Cardiovascular Disease | Admitting: Cardiovascular Disease

## 2018-06-01 NOTE — Telephone Encounter (Signed)
Patient advised to HOLD Xarelto two days prior to colonoscopy.  Patient verbalized understanding.  Patient states Suprep too expensive.  Patient is to pick up sample tomorrow.

## 2018-06-01 NOTE — Telephone Encounter (Signed)
Pericarditis occurred in September 2019, patient finished a course of ibuprofen and colchicine. Repeat echo in 01/2018 did not show any pericardial effusion. No significant concern regarding pericarditis from cardiology perspective due to lack of recurrence.  Per clinical pharmacist, hold Xarelto for 2 days prior to the procedure.

## 2018-06-01 NOTE — Telephone Encounter (Signed)
Faxed clearance to [facility]

## 2018-06-01 NOTE — Telephone Encounter (Signed)
Thank you :)

## 2018-06-02 ENCOUNTER — Encounter (HOSPITAL_COMMUNITY)
Admission: RE | Admit: 2018-06-02 | Discharge: 2018-06-02 | Disposition: A | Discharge status: Home / Self Care | Payer: Self-pay | Source: Ambulatory Visit | Attending: Cardiovascular Disease | Admitting: Cardiovascular Disease

## 2018-06-03 ENCOUNTER — Encounter (HOSPITAL_COMMUNITY): Payer: Self-pay

## 2018-06-08 ENCOUNTER — Other Ambulatory Visit: Payer: Self-pay

## 2018-06-08 ENCOUNTER — Encounter (HOSPITAL_COMMUNITY)
Admission: RE | Admit: 2018-06-08 | Discharge: 2018-06-08 | Disposition: A | Discharge status: Home / Self Care | Payer: Self-pay | Source: Ambulatory Visit | Attending: Cardiovascular Disease | Admitting: Cardiovascular Disease

## 2018-06-08 ENCOUNTER — Ambulatory Visit (HOSPITAL_COMMUNITY)
Admission: RE | Admit: 2018-06-08 | Discharge: 2018-06-08 | Disposition: A | Discharge status: Home / Self Care | Payer: Medicare Other | Source: Ambulatory Visit | Attending: Nurse Practitioner | Admitting: Nurse Practitioner

## 2018-06-08 ENCOUNTER — Encounter (HOSPITAL_COMMUNITY): Payer: Self-pay | Admitting: Nurse Practitioner

## 2018-06-08 VITALS — BP 122/66 | HR 63 | Ht 68.0 in | Wt 180.2 lb

## 2018-06-08 DIAGNOSIS — K219 Gastro-esophageal reflux disease without esophagitis: Secondary | ICD-10-CM | POA: Diagnosis not present

## 2018-06-08 DIAGNOSIS — Z79899 Other long term (current) drug therapy: Secondary | ICD-10-CM | POA: Diagnosis not present

## 2018-06-08 DIAGNOSIS — Z809 Family history of malignant neoplasm, unspecified: Secondary | ICD-10-CM | POA: Diagnosis not present

## 2018-06-08 DIAGNOSIS — Z7982 Long term (current) use of aspirin: Secondary | ICD-10-CM | POA: Insufficient documentation

## 2018-06-08 DIAGNOSIS — Z91018 Allergy to other foods: Secondary | ICD-10-CM | POA: Diagnosis not present

## 2018-06-08 DIAGNOSIS — I480 Paroxysmal atrial fibrillation: Secondary | ICD-10-CM | POA: Diagnosis present

## 2018-06-08 DIAGNOSIS — Z882 Allergy status to sulfonamides status: Secondary | ICD-10-CM | POA: Diagnosis not present

## 2018-06-08 DIAGNOSIS — I2510 Atherosclerotic heart disease of native coronary artery without angina pectoris: Secondary | ICD-10-CM | POA: Insufficient documentation

## 2018-06-08 DIAGNOSIS — Z888 Allergy status to other drugs, medicaments and biological substances status: Secondary | ICD-10-CM | POA: Diagnosis not present

## 2018-06-08 DIAGNOSIS — Z7901 Long term (current) use of anticoagulants: Secondary | ICD-10-CM | POA: Insufficient documentation

## 2018-06-08 DIAGNOSIS — Z8249 Family history of ischemic heart disease and other diseases of the circulatory system: Secondary | ICD-10-CM | POA: Insufficient documentation

## 2018-06-08 DIAGNOSIS — I083 Combined rheumatic disorders of mitral, aortic and tricuspid valves: Secondary | ICD-10-CM | POA: Diagnosis not present

## 2018-06-08 DIAGNOSIS — Z8546 Personal history of malignant neoplasm of prostate: Secondary | ICD-10-CM | POA: Diagnosis not present

## 2018-06-08 DIAGNOSIS — E785 Hyperlipidemia, unspecified: Secondary | ICD-10-CM | POA: Insufficient documentation

## 2018-06-08 NOTE — Progress Notes (Signed)
Jose Warner   Primary Care Physician: Derinda Late, MD Referring Physician: [place] F/u   Jose Warner is a 68 y.o. male with a h/o first onset  paroxysmal afib in 2014 following prostate surgery. Afib has been quiet since then. He was on eliquis for a period of time and then stopped for a low chadsvasc score.  He developed irregular heart beat 2/12 in the late afternoon and felt lightheaded and had some mild chest discomfort. He presented to the ER with an EKG showing afib at 119 bpm. He was cardioverted successfully and presents today with sinus brady at 48 bpm, not symptomatic with this and HR usually runs in the 50's. Trigger  may have been a recent URI treated with decongestants a week before. Also, the end of January he was switched form Bystolic 10 mg to bisoprolol due to cost. Denies snoring history, drinks one glass a wine a night, minimal caffeine.  Is active and not obese. He was started on xarelto 20 mg a day for chadsvasc score of 2  (age, cad).  F/u in afib clinic, for anther ER visit Friday, 11/1, for afib. He tells me since being seen here in early  February, after successful cardioversion, he was readmitted 2/19 with chest pain, had  a LHC and  had a stent placed in the LAD. He then had stuttering chest pain and had a repeat cath in April with CAD being stable and stent patent. He continued with chest pain and had amlodipine started by Dr. Claiborne Billings in April which helped to relieve the pain after that. He was recently seen in the  ER in  September  with chest pain thought to be 2/2 pericarditis. He was started on ibuprofen and colchicine. He then went back to the ER Friday with afib. He was told the ER physician did not want to cardiovert on top of pericarditis. Pt continues with chest discomfort but not as bad as when presented in September with chest pain. He continues on ibuprofen tid and colchicine. He does not feel that bad in afib but does notice  the difference in his heart  rate readings.  F/u in afib clinic, 2/18. When I last saw pt he had pericarditis and afib. ER was hesitant to cardiovert and I referred on to Dr. Rayann Heman. Pt had self converted by the time he saw him and no further treatment was needed. He has not had any further afib or chest pain.  Today, he denies symptoms of palpitations, chest pain, shortness of breath, orthopnea, PND, lower extremity edema, dizziness, presyncope, syncope, or neurologic sequela. The patient is tolerating medications without difficulties and is otherwise without complaint today.   Past Medical History:  Diagnosis Date  . Anemia   . Bronchitis 05-10-12   past fall- 4 runs antibiotics due to bronchitis-uses [employer] as needed  . Celiac disease   . Colon polyps    adenomatous  . Coronary artery disease   . Diverticulosis   . GERD (gastroesophageal reflux disease)   . Hepatitis 05-10-12   "was told non A, non B"-unclean dental equip.  Marland Kitchen Hyperlipidemia   . Iron deficiency anemia   . Pericarditis   . Persistent atrial fibrillation 06/05/2017  . Prostate cancer ([place]) 05-10-12   ;bx. 04-12-12, dx  . Spasm of esophagus 2013   Past Surgical History:  Procedure Laterality Date  . CARDIAC CATHETERIZATION  09/13/2004   LAD:30%-40% in prox. to mid segment with 20% in the mid segment  and 20% in left Circ., mild 20% right common iliac narrowing. medical therapy  . CARDIOVERSION  06/05/2017  . CORONARY STENT INTERVENTION N/A 06/11/2017   Procedure: CORONARY STENT INTERVENTION;  Surgeon: Belva Crome, MD;  Location: [facility];  Service: Cardiovascular;  Laterality: N/A;  . HERNIA REPAIR  5 yrs ago   right   . LEFT HEART CATH AND CORONARY ANGIOGRAPHY N/A 06/11/2017   Procedure: LEFT HEART CATH AND CORONARY ANGIOGRAPHY;  Surgeon: Belva Crome, MD;  Location: [facility];  Service: Cardiovascular;  Laterality: N/A;  . LEFT HEART CATH AND CORONARY ANGIOGRAPHY N/A 08/18/2017   Procedure: LEFT HEART CATH AND CORONARY  ANGIOGRAPHY;  Surgeon: Martinique, Peter M, MD;  Location: [facility];  Service: Cardiovascular;  Laterality: N/A;  . NM MYOCAR PERF WALL MOTION  11/30/2007   protocol:Bruce, post EF 67%,mild perfusion defect seen in basal inferior consistant with Attenuation artifact, exercise cap. 12METS  . ROBOT ASSISTED LAPAROSCOPIC RADICAL PROSTATECTOMY  05/20/2012   Procedure: ROBOTIC ASSISTED LAPAROSCOPIC RADICAL PROSTATECTOMY LEVEL 1;  Surgeon: Dutch Gray, MD;  Location: [facility];  Service: Urology;  Laterality: N/A;     . TRANSURETHRAL RESECTION OF PROSTATE  57yr ago    Current Outpatient Medications  Medication Sig Dispense Refill  . amLODipine (NORVASC) 2.5 MG tablet Take 1 tablet (2.5 mg total) by mouth daily. (Patient taking differently: Take 2.5 mg by mouth every evening. ) 90 tablet 3  . ascorbic acid (VITAMIN C) 500 MG tablet Take 500 mg by mouth every evening.     .Marland Kitchenaspirin EC 81 MG tablet Take 1 tablet (81 mg total) by mouth daily. 90 tablet 3  . bisoprolol (ZEBETA) 5 MG tablet Take 5 mg by mouth daily.    . budesonide-formoterol (SYMBICORT) 160-4.5 MCG/ACT inhaler Inhale 2 puffs into the lungs 2 (two) times daily as needed (for cough).     . ezetimibe (ZETIA) 10 MG tablet Take 10 mg by mouth every evening.     . Multiple Vitamins-Minerals (MULTIVITAMIN PO) Take 1 tablet by mouth at bedtime.     . niacin (NIASPAN) 1000 MG CR tablet Take 1 tablet by mouth at bedtime.     . pantoprazole (PROTONIX) 40 MG tablet Take 40 mg by mouth 2 (two) times daily.     . pravastatin (PRAVACHOL) 80 MG tablet Take 80 mg by mouth daily.    .Marland KitchenPROAIR HFA 108 (90 BASE) MCG/ACT inhaler Take 2 puffs by mouth 2 (two) times daily as needed. For shortness of breath.    . rivaroxaban (XARELTO) 20 MG TABS tablet Take 1 tablet (20 mg total) by mouth daily with supper. 90 tablet 3  . Vitamin D, Ergocalciferol, 2000 units CAPS Take 1 capsule by mouth at bedtime.     . Na Sulfate-K Sulfate-Mg Sulf 17.5-3.13-1.6 GM/177ML  SOLN Suprep-Use as directed (Patient not taking: Reported on 06/08/2018) 354 mL 0  . nitroGLYCERIN (NITROSTAT) 0.4 MG SL tablet Place 1 tablet (0.4 mg total) under the tongue every 5 (five) minutes x 3 doses as needed for chest pain. (Patient not taking: Reported on 06/08/2018) 30 tablet 0   No current facility-administered medications for this encounter.     Allergies  Allergen Reactions  . Gluten Meal Other (See Comments)    Celiac, stomach crams , gas  . Wheat Bran Other (See Comments)    Celiac   . Statins     Leg muscle pains - rosuvastatin, atorvastatin, zetia. Tolerates simvastatin  .  Sulfa Antibiotics Rash    Social History   Socioeconomic History  . Marital status: Married    Spouse name: Not on file  . Number of children: 3  . Years of education: Not on file  . Highest education level: Not on file  Occupational History    Employer: [employer]  . Financial resource strain: Not on file  . Food insecurity:    Worry: Patient refused    Inability: Patient refused  . Transportation needs:    Medical: Patient refused    Non-medical: Patient refused  Tobacco Use  . Smoking status: Never Smoker  . Smokeless tobacco: Never Used  Substance and Sexual Activity  . Alcohol use: Yes    Alcohol/week: 2.0 standard drinks    Types: 2 Glasses of wine per week    Comment: occ. x 2 drinks weekly  . Drug use: No  . Sexual activity: Yes    Partners: Female  Lifestyle  . Physical activity:    Days per week: Not on file    Minutes per session: Not on file  . Stress: Not on file  Relationships  . Social connections:    Talks on phone: Patient refused    Gets together: Patient refused    Attends religious service: Patient refused    Active member of club or organization: Patient refused    Attends meetings of clubs or organizations: Patient refused    Relationship status: Patient refused  . Intimate partner violence:    Fear of current or ex partner: Patient  refused    Emotionally abused: Patient refused    Physically abused: Patient refused    Forced sexual activity: Patient refused  Other Topics Concern  . Not on file  Social History Narrative  . Not on file    Family History  Problem Relation Age of Onset  . Stroke Mother   . Heart disease Father   . Heart attack Father   . Cancer Sister   . Stroke Maternal Grandfather   . Cancer Paternal Grandmother   . Heart attack Paternal Grandfather   . Heart disease Paternal Grandfather   . Colon cancer Neg Hx   . Esophageal cancer Neg Hx   . Pancreatic cancer Neg Hx   . Rectal cancer Neg Hx   . Stomach cancer Neg Hx     ROS- All systems are reviewed and negative except as per the HPI above  Physical Exam: Vitals:   06/08/18 1502  BP: 122/66  Pulse: 63  Weight: 81.7 kg  Height: 5' 8"  (1.727 m)   Wt Readings from Last 3 Encounters:  06/08/18 81.7 kg  05/28/18 80.3 kg  03/08/18 79.2 kg    Labs: Lab Results  Component Value Date   NA 140 02/19/2018   K 4.2 02/19/2018   CL 104 02/19/2018   CO2 26 02/19/2018   GLUCOSE 111 (H) 02/19/2018   BUN 14 02/19/2018   CREATININE 1.12 02/19/2018   CALCIUM 9.3 02/19/2018   PHOS 3.7 12/28/2017   MG 2.3 02/19/2018   Lab Results  Component Value Date   INR 1.18 06/11/2017   Lab Results  Component Value Date   CHOL 122 06/10/2017   HDL 41 06/10/2017   LDLCALC 60 06/10/2017   TRIG 103 06/10/2017     GEN- The patient is well appearing, alert and oriented x 3 today.   Head- normocephalic, atraumatic Eyes-  Sclera clear, conjunctiva pink Ears- hearing intact Oropharynx- clear Neck- supple, no  JVP Lymph- no cervical lymphadenopathy Lungs- Clear to ausculation bilaterally, normal work of breathing Heart- irregular rate and rhythm, no murmurs, rubs or gallops, PMI not laterally displaced GI- soft, NT, ND, + BS Extremities- no clubbing, cyanosis, or edema MS- no significant deformity or atrophy Skin- no rash or lesion Psych-  euthymic mood, full affect Neuro- strength and sensation are intact  EKG- NSR at 63 bpm, PR int 162 ms, qrs int 92 ms, qtc 407 ms Epic records reviewed Cardiac CT- 9/10-IMPRESSION: 1. Normal left ventricular size, thickness and hyperdynamic systolic function (LVEF = 71%). There are no regional wall motion abnormalities.  There is no late gadolinium enhancement in the left ventricular myocardium.  2. Normal right ventricular size, thickness and systolic function (LVEF = 73%). There are no regional wall motion abnormalities.  3. Mildly dilated left atrium. There is atypical LGE of the left and right atrial walls suspicious for fibrosis.  4. Normal size of the aortic root, ascending aorta and pulmonary artery.  5.  Mild aortic and mitral and trivial tricuspid regurgitation.  6. No pericardial effusion. There is mild late gadolinium enhancement adjacent to the inferolateral and anterior left ventricular walls and RV free wall.  Collectively, these findings are consistent with an acute pericarditis. LVEF has improved when compared to the prior echocardiogram on 06/10/2017 from 50-55% to 71%.   Assessment and Plan: 1. Paroxysmal afib  Quiet since November of 2019 Spontaneously converted after 6 days of having afib in the setting of pericarditis Continue  xarelto with a CHA2DS2VASc score of at least 2 Continue bisoprolol 5 mg daily  2. CAD No current anginal symptoms  3. Pericarditis Resolved    F/u with Dr.  Claiborne Billings 3/4 Afib clinic as needed  Butch Penny C. , [facility] [address] [place], [place] [address] [phone]

## 2018-06-09 ENCOUNTER — Encounter (HOSPITAL_COMMUNITY)
Admission: RE | Admit: 2018-06-09 | Discharge: 2018-06-09 | Disposition: A | Discharge status: Home / Self Care | Payer: Self-pay | Source: Ambulatory Visit | Attending: Cardiovascular Disease | Admitting: Cardiovascular Disease

## 2018-06-10 ENCOUNTER — Encounter (HOSPITAL_COMMUNITY): Payer: Self-pay

## 2018-06-15 ENCOUNTER — Encounter (HOSPITAL_COMMUNITY)
Admission: RE | Admit: 2018-06-15 | Discharge: 2018-06-15 | Disposition: A | Discharge status: Home / Self Care | Payer: Medicare Other | Source: Ambulatory Visit | Attending: Cardiovascular Disease | Admitting: Cardiovascular Disease

## 2018-06-16 ENCOUNTER — Encounter (HOSPITAL_COMMUNITY): Payer: Self-pay

## 2018-06-17 ENCOUNTER — Encounter (HOSPITAL_COMMUNITY): Payer: Self-pay

## 2018-06-17 ENCOUNTER — Other Ambulatory Visit: Payer: Self-pay | Admitting: Internal Medicine

## 2018-06-17 ENCOUNTER — Ambulatory Visit (AMBULATORY_SURGERY_CENTER): Payer: Medicare Other | Admitting: Internal Medicine

## 2018-06-17 ENCOUNTER — Encounter: Payer: Self-pay | Admitting: Internal Medicine

## 2018-06-17 VITALS — BP 111/68 | HR 58 | Temp 97.3°F | Resp 10 | Ht 68.0 in | Wt 177.0 lb

## 2018-06-17 DIAGNOSIS — D123 Benign neoplasm of transverse colon: Secondary | ICD-10-CM

## 2018-06-17 DIAGNOSIS — Z860100 Personal history of colon polyps, unspecified: Secondary | ICD-10-CM

## 2018-06-17 MED ORDER — SODIUM CHLORIDE 0.9 % IV SOLN: 500.0000 mL | Freq: Once | INTRAVENOUS | Status: DC

## 2018-06-17 NOTE — Patient Instructions (Addendum)
YOU HAD AN ENDOSCOPIC PROCEDURE TODAY AT [facility]:   Refer to the procedure report that was given to you for any specific questions about what was found during the examination.  If the procedure report does not answer your questions, please call your gastroenterologist to clarify.  If you requested that your care partner not be given the details of your procedure findings, then the procedure report has been included in a sealed envelope for you to review at your convenience later.  YOU SHOULD EXPECT: Some feelings of bloating in the abdomen. Passage of more gas than usual.  Walking can help get rid of the air that was put into your GI tract during the procedure and reduce the bloating. If you had a lower endoscopy (such as a colonoscopy or flexible sigmoidoscopy) you may notice spotting of blood in your stool or on the toilet paper. If you underwent a bowel prep for your procedure, you may not have a normal bowel movement for a few days.  Please Note:  You might notice some irritation and congestion in your nose or some drainage.  This is from the oxygen used during your procedure.  There is no need for concern and it should clear up in a day or so.  SYMPTOMS TO REPORT IMMEDIATELY:   Following lower endoscopy (colonoscopy or flexible sigmoidoscopy):  Excessive amounts of blood in the stool  Significant tenderness or worsening of abdominal pains  Swelling of the abdomen that is new, acute  Fever of 100F or higher  For urgent or emergent issues, a gastroenterologist can be reached at any hour by calling [phone].   DIET:  We do recommend a small meal at first, but then you may proceed to your regular diet.  Drink plenty of fluids but you should avoid alcoholic beverages for 24 hours.  ACTIVITY:  You should plan to take it easy for the rest of today and you should NOT DRIVE or use heavy machinery until tomorrow (because of the sedation medicines used during the test).     FOLLOW UP: Our staff will call the number listed on your records the next business day following your procedure to check on you and address any questions or concerns that you may have regarding the information given to you following your procedure. If we do not reach you, we will leave a message.  However, if you are feeling well and you are not experiencing any problems, there is no need to return our call.  We will assume that you have returned to your regular daily activities without incident.  If any biopsies were taken you will be contacted by phone or by letter within the next 1-3 weeks.  Please call us at [phone] if you have not heard about the biopsies in 3 weeks.  Resume Xarelto at prior dose today Await for biopsy results Polyps (handout given) Diverticulosis (handout given) Hemorrhoids (handout given)  SIGNATURES/CONFIDENTIALITY: You and/or your care partner have signed paperwork which will be entered into your electronic medical record.  These signatures attest to the fact that that the information above on your After Visit Summary has been reviewed and is understood.  Full responsibility of the confidentiality of this discharge information lies with you and/or your care-partner.

## 2018-06-17 NOTE — Progress Notes (Signed)
PT taken to PACU. Monitors in place. VSS. Report given to RN. 

## 2018-06-17 NOTE — Progress Notes (Signed)
Called to room to assist during endoscopic procedure.  Patient ID and intended procedure confirmed with present staff. Received instructions for my participation in the procedure from the performing physician.  

## 2018-06-17 NOTE — Op Note (Signed)
[facility] Patient Name: Jose Warner Procedure Date: 06/17/2018 2:50 PM MRN: [id number] Endoscopist: Docia Chuck. Henrene Pastor , MD Age: 68 Referring MD:  Date of Birth: [birth year] Gender: Male Account #: [id number] Procedure:                Colonoscopy with cold snare polypectomy x 1 Indications:              High risk colon cancer surveillance: Personal                            history of multiple (3 or more) adenomas. Previous                            examinations 2004, 2008, 2013 Medicines:                Monitored Anesthesia Care Procedure:                Pre-Anesthesia Assessment:                           - Prior to the procedure, a History and Physical                            was performed, and patient medications and                            allergies were reviewed. The patient's tolerance of                            previous anesthesia was also reviewed. The risks                            and benefits of the procedure and the sedation                            options and risks were discussed with the patient.                            All questions were answered, and informed consent                            was obtained. Prior Anticoagulants: The patient has                            taken Xarelto (rivaroxaban), last dose was 3 days                            prior to procedure. ASA Grade Assessment: III - A                            patient with severe systemic disease. After                            reviewing the risks and benefits, the patient was  deemed in satisfactory condition to undergo the                            procedure.                           After obtaining informed consent, the colonoscope                            was passed under direct vision. Throughout the                            procedure, the patient's blood pressure, pulse, and                            oxygen saturations were monitored  continuously. The                            Colonoscope was introduced through the anus and                            advanced to the the cecum, identified by                            appendiceal orifice and ileocecal valve. The                            ileocecal valve, appendiceal orifice, and rectum                            were photographed. The quality of the bowel                            preparation was excellent. The colonoscopy was                            performed without difficulty. The patient tolerated                            the procedure well. The bowel preparation used was                            SUPREP. Scope In: 2:58:29 PM Scope Out: 3:10:51 PM Scope Withdrawal Time: 0 hours 8 minutes 54 seconds  Total Procedure Duration: 0 hours 12 minutes 22 seconds  Findings:                 A 4 mm polyp was found in the distal transverse                            colon. The polyp was removed with a cold snare.                            Resection and retrieval were complete.  Multiple diverticula were found in the left colon.                           Internal hemorrhoids were found during                            retroflexion. The hemorrhoids were small.                           The exam was otherwise without abnormality on                            direct and retroflexion views. Complications:            No immediate complications. Estimated blood loss:                            None. Estimated Blood Loss:     Estimated blood loss: none. Impression:               - One 4 mm polyp in the distal transverse colon,                            removed with a cold snare. Resected and retrieved.                           - Diverticulosis in the left colon.                           - Internal hemorrhoids.                           - The examination was otherwise normal on direct                            and retroflexion  views. Recommendation:           - Repeat colonoscopy in 5 years for surveillance.                           - Resume Xarelto (rivaroxaban) today at prior dose.                           - Patient has a contact number available for                            emergencies. The signs and symptoms of potential                            delayed complications were discussed with the                            patient. Return to normal activities tomorrow.                            Written discharge instructions were provided to the  patient.                           - Resume previous diet.                           - Continue present medications.                           - Await pathology results. Docia Chuck. Henrene Pastor, MD 06/17/2018 3:16:14 PM This report has been signed electronically.

## 2018-06-18 ENCOUNTER — Telehealth: Payer: Self-pay | Admitting: *Deleted

## 2018-06-18 NOTE — Telephone Encounter (Signed)
  Follow up Call-  Call back number 06/17/2018  Post procedure Call Back phone  # [phone]  Permission to leave phone message Yes  Some recent data might be hidden     Patient questions:  Do you have a fever, pain , or abdominal swelling? No. Pain Score  0 *  Have you tolerated food without any problems? Yes.    Have you been able to return to your normal activities? Yes.    Do you have any questions about your discharge instructions: Diet   No. Medications  No. Follow up visit  No.  Do you have questions or concerns about your Care? No.  Actions: * If pain score is 4 or above: No action needed, pain <4.

## 2018-06-22 ENCOUNTER — Encounter: Payer: Self-pay | Admitting: Internal Medicine

## 2018-06-22 ENCOUNTER — Encounter (HOSPITAL_COMMUNITY)
Admission: RE | Admit: 2018-06-22 | Discharge: 2018-06-22 | Disposition: A | Discharge status: Home / Self Care | Payer: Medicare Other | Source: Ambulatory Visit | Attending: Cardiovascular Disease | Admitting: Cardiovascular Disease

## 2018-06-22 DIAGNOSIS — Z955 Presence of coronary angioplasty implant and graft: Secondary | ICD-10-CM | POA: Insufficient documentation

## 2018-06-23 ENCOUNTER — Ambulatory Visit (INDEPENDENT_AMBULATORY_CARE_PROVIDER_SITE_OTHER): Payer: Medicare Other | Admitting: Cardiovascular Disease

## 2018-06-23 ENCOUNTER — Encounter: Payer: Self-pay | Admitting: Cardiovascular Disease

## 2018-06-23 ENCOUNTER — Encounter (HOSPITAL_COMMUNITY)
Admission: RE | Admit: 2018-06-23 | Discharge: 2018-06-23 | Disposition: A | Discharge status: Home / Self Care | Payer: Self-pay | Source: Ambulatory Visit | Attending: Cardiovascular Disease | Admitting: Cardiovascular Disease

## 2018-06-23 VITALS — BP 116/60 | Ht 68.0 in | Wt 180.6 lb

## 2018-06-23 DIAGNOSIS — I2510 Atherosclerotic heart disease of native coronary artery without angina pectoris: Secondary | ICD-10-CM

## 2018-06-23 DIAGNOSIS — I480 Paroxysmal atrial fibrillation: Secondary | ICD-10-CM | POA: Diagnosis not present

## 2018-06-23 DIAGNOSIS — I319 Disease of pericardium, unspecified: Secondary | ICD-10-CM

## 2018-06-23 DIAGNOSIS — J45909 Unspecified asthma, uncomplicated: Secondary | ICD-10-CM

## 2018-06-23 DIAGNOSIS — Z7901 Long term (current) use of anticoagulants: Secondary | ICD-10-CM | POA: Diagnosis not present

## 2018-06-23 DIAGNOSIS — Z8546 Personal history of malignant neoplasm of prostate: Secondary | ICD-10-CM

## 2018-06-23 DIAGNOSIS — E782 Mixed hyperlipidemia: Secondary | ICD-10-CM

## 2018-06-23 MED ORDER — NITROGLYCERIN 0.4 MG SL SUBL: 0.4000 mg | SUBLINGUAL_TABLET | SUBLINGUAL | 0 refills | Status: DC | PRN

## 2018-06-23 NOTE — Progress Notes (Signed)
Patient ID: Jose Warner, male   DOB: [birth year], 67 y.o.   MRN: [id number]     PCP: Dr. Derinda Late   HPI: Jose Warner is a 68 y.o. male who presents for a 26-monthfollow-up cardiology evaluation.   Jose Warner a strong family history for premature coronary artery disease. In May 2006 cardiac catheterization showed very mild LAD and circumflex narrowing felt to be not flow limiting at approximately 20-30%. He has a history of low HDL levels. He developed focal prostate cancer and underwent robotic prostatectomy by Dr. GAraceli Bouchein 2014 When I saw him after his prostate surgery in March 2014 he was in atrial fibrillation at which time he was completely unaware of this rhythm disturbance. He was started on eliquis 5 mg twice a day anticoagulation and as well as metoprolol succinate. He subsequently converted spontaneously to sinus rhythm and has been maintaining sinus rhythm since. He remains active. He exercises daily. He denies any chest pain. He denies shortness of breath. He denies palpitations. His echo Doppler study showed an ejection fraction in the 55-65% range. He had  upper normal LA size. There is mild aortic insufficiency. Pulmonary pressures were normal with an estimated RV systolic pressure 15 mm.  In the past, he had been on Niaspan for low HDL levels. This was stopped when he was also put on cialis following his prostate surgery. He no longer takes cialis.  Laboratory done in April 2014 showed cholesterol 131 triglycerides 96 his HDL remains very low at 23 and his LDL was 89. Thyroid function studies were normal as were his hemoglobin hematocrit and chemistries.  He is [place] from [employer] is now doing pMidwifework with travel.  He denies any episodes of chest pain.  Dr. BSandi Mariscalhas put him back on his simvastatin 40 mg and niacin 1000 mg based on laboratory that he had done.  He continues to take Bystolic 5 mg for hypertension.  He continues to have  difficulty with erectile function following his prostate surgery.  He also was diagnosed with celiac disease and is extremely sensitive to gluten.   He remains active.  He is not aware of any recurrent arrhythmia.  Dr. BSandi Mariscalchecks laboratory and he was told that his labs most recently were excellent.  He is tolerating Bystolic 5 mg.  He is followed by Dr. BAlinda Moneyfor his prostate.  He is on a gluten-free diet.   He was hospitalized on 06/09/2017 after having experienced intermittent chest pain  He was felt to potentially have unstableand stenting of a 95% mid LAD stenosiswithin a diffusely diseasedlcified segmen  There also was mild 40% narrowing proximal and distal to the stent.  He was discharged on 06/12/2017.  He felt vague recurrent chest pain leading to an additional overnight hospital stayon from 324.  Troponins were negative.  ECG was without ischemic changes and he was   Since he was bradycardic with heart rates in the 40s.    When I saw him for follow-up evaluation in March 2019 well and was gradually gaining his strength.  Because of his PAF, he was on Plavix and Xarelto.  He also was on bisoprolol 2.5 mg daily.  They continue to be on niacin and simvastatin per Dr. BSandi Mariscaland is on Symbicort with a history of asthma.   He developed a different type of chest pain that was lasted tense but had occurred consecutively on 3 days in a row.  He had taken nitroglycerin  with some improvement.  He was rehospitalized on August 17, 2017 and underwent repeat cardiac catheterization August 18, 2017 by Dr. Martinique.  His LAD stent was patent but he had 30% proximal, 40% mid, and 20% distal LAD stenoses.  He has felt improved.  He was treated for possible bronchitis and was given Z-Pak by Dr. Sandi Mariscal.    He was hospitalized from September 9 through December 30, 2017 after developing left upper chest, neck pain associated with left arm numbness not responsive to nitroglycerin.  His ECG did not reveal  ischemic changes.  D-dimer was detectable.  He was diagnosed as having acute pericarditis which was confirmed on MRI imaging and he was treated with ibuprofen in addition to colchicine.  CT also demonstrated cervical spondylosis without acute findings.  His symptoms have gradually improved therapy.   I last saw him on January 19, 2018.  At that time he was feeling well but still experienced occasional sharp discomfort but denied any classic pleuritic-like symptoms.  He completed a 45-monthcourse of colchicine for his pericarditis.  He represented to the emergency room on November 1 with recurrent AF and was seen in follow-up in the A. fib clinic with DRoderic Palau  He was not cardioverted in the ER due to his ongoing treatment for pericarditis and he continued to be on bisoprolol 5 mg daily in addition to Xarelto.  He saw Dr. ARayann Hemanin follow-up March 08, 2018 and during that evaluation it was noted that he had converted on his own back to sinus rhythm.  Since that evaluation he denies any awareness of recurrent AF.  He sees Dr. PDerinda Latefor primary care who checks his complete laboratory.  He denies any recurrent pleuritic chest pain, but very rarely has noticed a sharp knifelike sensation which lasts seconds and resolves.  He denies any anginal type symptoms.  He presents for reevaluation.  Past Medical History:  Diagnosis Date  . Anemia   . Bronchitis 05-10-12   past fall- 4 runs antibiotics due to bronchitis-uses PDynegyas needed  . Celiac disease   . Colon polyps    adenomatous  . Coronary artery disease   . Diverticulosis   . GERD (gastroesophageal reflux disease)   . Hepatitis 05-10-12   "was told non A, non B"-unclean dental equip.  .Marland KitchenHyperlipidemia   . Iron deficiency anemia   . Pericarditis   . Persistent atrial fibrillation 06/05/2017  . [place]   ;bx. 04-12-12, dx  . Spasm of esophagus 2013    Past Surgical History:  Procedure Laterality Date  .  CARDIAC CATHETERIZATION  09/13/2004   LAD:30%-40% in prox. to mid segment with 20% in the mid segment and 20% in left Circ., mild 20% right common iliac narrowing. medical therapy  . CARDIOVERSION  06/05/2017  . CORONARY STENT INTERVENTION N/A 06/11/2017   Procedure: CORONARY STENT INTERVENTION;  Surgeon: SBelva Crome MD;  [facility];  Service: Cardiovascular;  Laterality: N/A;  . HERNIA REPAIR  5 yrs ago   right   . LEFT HEART CATH AND CORONARY ANGIOGRAPHY N/A 06/11/2017   Procedure: LEFT HEART CATH AND CORONARY ANGIOGRAPHY;  Surgeon: SBelva Crome MD;  [facility];  Service: Cardiovascular;  Laterality: N/A;  . LEFT HEART CATH AND CORONARY ANGIOGRAPHY N/A 08/18/2017   Procedure: LEFT HEART CATH AND CORONARY ANGIOGRAPHY;  Surgeon: JMartinique Peter M, MD;  [facility];  Service: Cardiovascular;  Laterality: N/A;  .  NM MYOCAR PERF WALL MOTION  11/30/2007   protocol:Bruce, post EF 67%,mild perfusion defect seen in basal inferior consistant with Attenuation artifact, exercise cap. 12METS  . ROBOT ASSISTED LAPAROSCOPIC RADICAL PROSTATECTOMY  05/20/2012   Procedure: ROBOTIC ASSISTED LAPAROSCOPIC RADICAL PROSTATECTOMY LEVEL 1;  Surgeon: Dutch Gray, MD;  Location: [facility];  Service: Urology;  Laterality: N/A;     . TRANSURETHRAL RESECTION OF PROSTATE  93yr ago    Allergies  Allergen Reactions  . Gluten Meal Other (See Comments)    Celiac, stomach crams , gas  . Wheat Bran Other (See Comments)    Celiac   . Statins     Leg muscle pains - rosuvastatin, atorvastatin, zetia. Tolerates simvastatin  . Sulfa Antibiotics Rash    Current Outpatient Medications  Medication Sig Dispense Refill  . amLODipine (NORVASC) 2.5 MG tablet Take 1 tablet (2.5 mg total) by mouth daily. (Patient taking differently: Take 2.5 mg by mouth every evening. ) 90 tablet 3  . ascorbic acid (VITAMIN C) 500 MG tablet Take 500 mg by mouth every evening.     .Marland Kitchenaspirin EC 81  MG tablet Take 1 tablet (81 mg total) by mouth daily. 90 tablet 3  . bisoprolol (ZEBETA) 5 MG tablet Take 5 mg by mouth daily.    . budesonide-formoterol (SYMBICORT) 160-4.5 MCG/ACT inhaler Inhale 2 puffs into the lungs 2 (two) times daily as needed (for cough).     . ezetimibe (ZETIA) 10 MG tablet Take 10 mg by mouth every evening.     . Multiple Vitamins-Minerals (MULTIVITAMIN PO) Take 1 tablet by mouth at bedtime.     . niacin (NIASPAN) 1000 MG CR tablet Take 1 tablet by mouth at bedtime.     . nitroGLYCERIN (NITROSTAT) 0.4 MG SL tablet Place 1 tablet (0.4 mg total) under the tongue every 5 (five) minutes x 3 doses as needed for chest pain. 30 tablet 0  . pantoprazole (PROTONIX) 40 MG tablet Take 40 mg by mouth 2 (two) times daily.     . pravastatin (PRAVACHOL) 80 MG tablet Take 80 mg by mouth daily.    .Marland KitchenPROAIR HFA 108 (90 BASE) MCG/ACT inhaler Take 2 puffs by mouth 2 (two) times daily as needed. For shortness of breath.    . rivaroxaban (XARELTO) 20 MG TABS tablet Take 1 tablet (20 mg total) by mouth daily with supper. 90 tablet 3  . Vitamin D, Ergocalciferol, 2000 units CAPS Take 1 capsule by mouth at bedtime.      Current Facility-Administered Medications  Medication Dose Route Frequency Provider Last Rate Last Dose  . 0.9 %  sodium chloride infusion  500 mL Intravenous Once PIrene Shipper MD        Social History   Socioeconomic History  . Marital status: Married    Spouse name: Not on file  . Number of children: 3  . Years of education: Not on file  . Highest education level: Not on file  Occupational History    Employer: [employer] . Financial resource strain: Not on file  . Food insecurity:    Worry: Patient refused    Inability: Patient refused  . Transportation needs:    Medical: Patient refused    Non-medical: Patient refused  Tobacco Use  . Smoking status: Never Smoker  . Smokeless tobacco: Never Used  Substance and Sexual Activity  . Alcohol use:  Never    Alcohol/week: 2.0 standard drinks    Types: 2 Glasses of  wine per week    Frequency: Never  . Drug use: No  . Sexual activity: Yes    Partners: Female  Lifestyle  . Physical activity:    Days per week: Not on file    Minutes per session: Not on file  . Stress: Not on file  Relationships  . Social connections:    Talks on phone: Patient refused    Gets together: Patient refused    Attends religious service: Patient refused    Active member of club or organization: Patient refused    Attends meetings of clubs or organizations: Patient refused    Relationship status: Patient refused  . Intimate partner violence:    Fear of current or ex partner: Patient refused    Emotionally abused: Patient refused    Physically abused: Patient refused    Forced sexual activity: Patient refused  Other Topics Concern  . Not on file  Social History Narrative  . Not on file    Socially, he is a PhD and Is now retired from [employer].  He had been in the turf business and floral arrangements.  ROS General: Negative; No fevers, chills, or night sweats;  HEENT: Negative; No changes in vision or hearing, sinus congestion, difficulty swallowing Pulmonary: Negative; No cough, wheezing, shortness of breath, hemoptysis Cardiovascular: see HPI GI: Positive for celiac disease GU: Positive for erectile dysfunction since his robotic prostatic surgery No dysuria, hematuria, or difficulty voiding Musculoskeletal: Negative; no myalgias, joint pain, or weakness Hematologic/Oncology: Negative; no easy bruising, bleeding Endocrine: Negative; no heat/cold intolerance; no diabetes Neuro: Negative; no changes in balance, headaches Skin: Negative; No rashes or skin lesions Psychiatric: Negative; No behavioral problems, depression Sleep: Negative; No snoring, daytime sleepiness, hypersomnolence, bruxism, restless legs, hypnogognic hallucinations, no cataplexy Other comprehensive 14 point system review is  negative.   PE BP 116/60   Ht 5' 8"  (1.727 m)   Wt 180 lb 9.6 oz (81.9 kg)   BMI 27.46 kg/m     Repeat blood pressure by me was 106/64 supine and was 110/64 standing  Wt Readings from Last 3 Encounters:  06/23/18 180 lb 9.6 oz (81.9 kg)  06/17/18 177 lb (80.3 kg)  06/08/18 180 lb 3.2 oz (81.7 kg)   General: Alert, oriented, no distress.  Skin: normal turgor, no rashes, warm and dry HEENT: Normocephalic, atraumatic. Pupils equal round and reactive to light; sclera anicteric; extraocular muscles intact;  Nose without nasal septal hypertrophy Mouth/Parynx benign; Mallinpatti scale 3 Neck: No JVD, no carotid bruits; normal carotid upstroke Lungs: clear to ausculatation and percussion; no wheezing or rales Chest wall: without tenderness to palpitation Heart: PMI not displaced, RRR, s1 s2 normal, 1/6 systolic murmur, no diastolic murmur, no rubs, gallops, thrills, or heaves Abdomen: soft, nontender; no hepatosplenomehaly, BS+; abdominal aorta nontender and not dilated by palpation. Back: no CVA tenderness Pulses 2+ Musculoskeletal: full range of motion, normal strength, no joint deformities Extremities: no clubbing cyanosis or edema, Homan's sign negative  Neurologic: grossly nonfocal; Cranial nerves grossly wnl Psychologic: Normal mood and affect   ECG (independently read by me): Sinus bradycardia 54 bpm.  No ectopy.  Normal intervals.  October 2019 ECG (independently read by me): Sinus bradycardia at 46 bpm.  No ectopy.  Normal intervals.  No ST segment abnormalities  May 2019 ECG (independently read by me): Normal sinus rhythm at 66 bpm.normal sinus rhythm at 66 bpm.  No ectopy.  Normal intervals.  No ST segment depression. No ectopy.  Normal intervals.  No  ST segment depression.  July 02, 2017 ECG (independently read by me): sinus bradycardia 51 bpm.  No ST segment changes.  Normal intervals.  November 2018ECG (independently read by me): Sinus bradycardia 56 bpm.  No ectopy.   Normal intervals.  October 2017 ECG (independently read by me): Sinus bradycardia 59 bpm.  Normal intervals.  No ectopy.  May 2015 ECG (and apparently read by me): Normal sinus rhythm at 60 beats per minute.  Early transition.  No ectopy.  PR interval 174 ms, QTc interval 460 ms.  ECG: (independently read by me): Sinus rhythm at 56 beats per minute. No ectopy. Normal intervals.  LABS:  BMP Latest Ref Rng & Units 02/19/2018 12/29/2017 12/28/2017  Glucose 70 - 99 mg/dL 111(H) 107(H) 115(H)  BUN 8 - 23 mg/dL 14 11 16   Creatinine 0.61 - 1.24 mg/dL 1.12 0.99 0.95  Sodium 135 - 145 mmol/L 140 139 140  Potassium 3.5 - 5.1 mmol/L 4.2 4.1 3.8  Chloride 98 - 111 mmol/L 104 111 108  CO2 22 - 32 mmol/L 26 22 21(L)  Calcium 8.9 - 10.3 mg/dL 9.3 8.3(L) 8.8(L)    Hepatic Function Panel     Component Value Date/Time   PROT 5.7 (L) 06/12/2017 0804     CBC Latest Ref Rng & Units 02/19/2018 12/29/2017 12/28/2017  WBC 4.0 - 10.5 K/uL 6.0 5.0 5.9  Hemoglobin 13.0 - 17.0 g/dL 16.3 13.0 15.0  Hematocrit 39.0 - 52.0 % 52.0 41.0 44.6  Platelets 150 - 400 K/uL 215 165 179    BNP No results found for: PROBNP  Lipid Panel     Component Value Date/Time   CHOL 122 06/10/2017 0108     RADIOLOGY: No results found.  IMPRESSION:  1. Paroxysmal atrial fibrillation (HCC)   2. Anticoagulation adequate   3. Coronary artery disease involving native coronary artery of native heart without angina pectoris   4. Pericarditis, unspecified chronicity, unspecified type   5. Mixed hyperlipidemia   6. History of prostate cancer   7. Mild asthma without complication, unspecified whether persistent     ASSESSMENT AND PLAN: Jose Warner Is a 68 year-old gentleman who had undergone cardiac catheterization in May 2006 and was found to have mild nonobstructive CAD.  He has a history of hyperlipidemia with very low HDL levels and has been on niacin in addition to his simvastatin 40 mg followed by Dr. Sandi Mariscal.    He  developed atrial fibrillation for which he was unaware following his robotic prostatectomy for his prostate cancer.  He was hospitalized in February 2019 with intermittent chest pain.  Due to worrisome symptoms of unstable angina, catheterization was performed and revealed a 95% mid LAD stenosis in a calcified segment andmild additional concomitant CAD.  Initially he received triple therapy but ultimately aspirin was discontinued.  He developed repeat chest pain leading to repeat catheterization on August 18, 2017.  This chest pain was less intense character but also did seem to be somewhat nitrate responsive.  He also took a Z-Pak for potential treatment of bronchitis.  He was diagnosed with acute pericarditis confirmed by MRI imaging September 2019. He on Plavix in addition to Xarelto for his anticoagulation with his recent use of nonsteroidal anti-inflammatory medication and treatment with colchicine and I saw him in follow-up since it had been over 6 months since his stent placement I recommended he discontinue Plavix but resume a baby aspirin 81 mg and continue Xarelto.  He was on colchicine for at least 3 months  duration.  Since his most recent episode of AF lasted 6 days in early November he has been without recurrent arrhythmia.  His blood pressure today is stable on bisoprolol 5 mg twice a day and amlodipine 2.5 mg..  He continues to be on Zetia 10 mg, niacin and pravastatin for hyperlipidemia.  He is on Xarelto and baby aspirin.  Dr. Sandi Mariscal checks his complete laboratory and I will asked that these be forwarded to me for my review.  I suspect his most recent episode of atrial fibrillation was contributed by his recent pericarditis.  I will see him in 6 months for reevaluation.  Time spent 25 minutes Troy Sine, MD, [facility]  06/24/2018  4:21 PM

## 2018-06-23 NOTE — Patient Instructions (Signed)

## 2018-06-24 ENCOUNTER — Encounter: Payer: Self-pay | Admitting: Cardiovascular Disease

## 2018-06-24 ENCOUNTER — Encounter (HOSPITAL_COMMUNITY): Payer: Self-pay

## 2018-06-29 ENCOUNTER — Encounter (HOSPITAL_COMMUNITY)
Admission: RE | Admit: 2018-06-29 | Discharge: 2018-06-29 | Disposition: A | Discharge status: Home / Self Care | Payer: Self-pay | Source: Ambulatory Visit | Attending: Cardiovascular Disease | Admitting: Cardiovascular Disease

## 2018-06-30 ENCOUNTER — Encounter (HOSPITAL_COMMUNITY)
Admission: RE | Admit: 2018-06-30 | Discharge: 2018-06-30 | Disposition: A | Discharge status: Home / Self Care | Payer: Self-pay | Source: Ambulatory Visit | Attending: Cardiovascular Disease | Admitting: Cardiovascular Disease

## 2018-06-30 ENCOUNTER — Other Ambulatory Visit: Payer: Self-pay

## 2018-07-01 ENCOUNTER — Encounter (HOSPITAL_COMMUNITY): Payer: Self-pay

## 2018-07-05 ENCOUNTER — Telehealth (HOSPITAL_COMMUNITY): Payer: Self-pay

## 2018-07-05 NOTE — Telephone Encounter (Signed)
Phone call to patient communicated that cardiac rehab will be closing for 2 weeks d/t the corona virus. Pt verbalized understanding.  

## 2018-07-06 ENCOUNTER — Encounter (HOSPITAL_COMMUNITY): Payer: Self-pay

## 2018-07-07 ENCOUNTER — Encounter (HOSPITAL_COMMUNITY): Payer: Self-pay

## 2018-07-08 ENCOUNTER — Encounter (HOSPITAL_COMMUNITY): Payer: Self-pay

## 2018-07-08 ENCOUNTER — Other Ambulatory Visit: Payer: Self-pay

## 2018-07-08 MED ORDER — RIVAROXABAN 20 MG PO TABS: 20.0000 mg | ORAL_TABLET | Freq: Every day | ORAL | 1 refills | Status: DC

## 2018-07-08 NOTE — Telephone Encounter (Signed)
Pt called in requesting a refill on his Xarelto. Please address. Thank you

## 2018-07-13 ENCOUNTER — Encounter (HOSPITAL_COMMUNITY): Payer: Self-pay

## 2018-07-13 ENCOUNTER — Telehealth (HOSPITAL_COMMUNITY): Payer: Self-pay | Admitting: Family Medicine

## 2018-07-14 ENCOUNTER — Encounter (HOSPITAL_COMMUNITY): Payer: Self-pay

## 2018-07-15 ENCOUNTER — Encounter (HOSPITAL_COMMUNITY): Payer: Self-pay

## 2018-07-20 ENCOUNTER — Encounter (HOSPITAL_COMMUNITY): Payer: Self-pay

## 2018-07-21 ENCOUNTER — Encounter (HOSPITAL_COMMUNITY): Payer: Self-pay

## 2018-07-22 ENCOUNTER — Encounter (HOSPITAL_COMMUNITY): Payer: Self-pay

## 2018-07-27 ENCOUNTER — Encounter (HOSPITAL_COMMUNITY): Payer: Self-pay

## 2018-07-28 ENCOUNTER — Encounter (HOSPITAL_COMMUNITY): Payer: Self-pay

## 2018-07-28 ENCOUNTER — Telehealth (HOSPITAL_COMMUNITY): Payer: Self-pay | Admitting: *Deleted

## 2018-07-28 NOTE — Telephone Encounter (Signed)
Called to notify patient that the cardiac and pulmonary rehabilitation department will be closed temporarily due to COVID-19 restrictions. Pt verbalized understanding.   Sol Passer, MS, ACSM CEP 07/28/2018 1427

## 2018-07-29 ENCOUNTER — Encounter (HOSPITAL_COMMUNITY): Payer: Self-pay

## 2018-08-03 ENCOUNTER — Encounter (HOSPITAL_COMMUNITY): Payer: Self-pay

## 2018-08-04 ENCOUNTER — Encounter (HOSPITAL_COMMUNITY): Payer: Self-pay

## 2018-08-05 ENCOUNTER — Encounter (HOSPITAL_COMMUNITY): Payer: Self-pay

## 2018-08-10 ENCOUNTER — Encounter (HOSPITAL_COMMUNITY): Payer: Self-pay

## 2018-08-11 ENCOUNTER — Encounter (HOSPITAL_COMMUNITY): Payer: Self-pay

## 2018-08-12 ENCOUNTER — Encounter (HOSPITAL_COMMUNITY): Payer: Self-pay

## 2018-08-17 ENCOUNTER — Encounter (HOSPITAL_COMMUNITY): Payer: Self-pay

## 2018-08-18 ENCOUNTER — Encounter (HOSPITAL_COMMUNITY): Payer: Self-pay

## 2018-08-19 ENCOUNTER — Encounter (HOSPITAL_COMMUNITY): Payer: Self-pay

## 2018-08-24 ENCOUNTER — Encounter (HOSPITAL_COMMUNITY): Payer: Self-pay

## 2018-08-25 ENCOUNTER — Encounter (HOSPITAL_COMMUNITY): Payer: Self-pay

## 2018-08-26 ENCOUNTER — Encounter (HOSPITAL_COMMUNITY): Payer: Self-pay

## 2018-08-31 ENCOUNTER — Encounter (HOSPITAL_COMMUNITY): Payer: Self-pay

## 2018-09-01 ENCOUNTER — Encounter (HOSPITAL_COMMUNITY): Payer: Self-pay

## 2018-09-02 ENCOUNTER — Encounter (HOSPITAL_COMMUNITY): Payer: Self-pay

## 2018-09-07 ENCOUNTER — Encounter (HOSPITAL_COMMUNITY): Payer: Self-pay

## 2018-09-08 ENCOUNTER — Encounter (HOSPITAL_COMMUNITY): Payer: Self-pay

## 2018-09-09 ENCOUNTER — Encounter (HOSPITAL_COMMUNITY): Payer: Self-pay

## 2018-09-14 ENCOUNTER — Encounter (HOSPITAL_COMMUNITY): Payer: Self-pay

## 2018-09-15 ENCOUNTER — Encounter (HOSPITAL_COMMUNITY): Payer: Self-pay

## 2018-09-16 ENCOUNTER — Encounter (HOSPITAL_COMMUNITY): Payer: Self-pay

## 2018-09-21 ENCOUNTER — Encounter (HOSPITAL_COMMUNITY): Payer: Self-pay

## 2018-09-22 ENCOUNTER — Encounter (HOSPITAL_COMMUNITY): Payer: Self-pay

## 2018-09-23 ENCOUNTER — Encounter (HOSPITAL_COMMUNITY): Payer: Self-pay

## 2018-09-28 ENCOUNTER — Encounter (HOSPITAL_COMMUNITY): Payer: Self-pay

## 2018-09-29 ENCOUNTER — Encounter (HOSPITAL_COMMUNITY): Payer: Self-pay

## 2018-09-30 ENCOUNTER — Encounter (HOSPITAL_COMMUNITY): Payer: Self-pay

## 2018-10-05 ENCOUNTER — Encounter (HOSPITAL_COMMUNITY): Payer: Self-pay

## 2018-10-06 ENCOUNTER — Encounter (HOSPITAL_COMMUNITY): Payer: Self-pay

## 2018-10-07 ENCOUNTER — Encounter (HOSPITAL_COMMUNITY): Payer: Self-pay

## 2018-10-12 ENCOUNTER — Encounter (HOSPITAL_COMMUNITY): Payer: Self-pay

## 2018-10-13 ENCOUNTER — Encounter (HOSPITAL_COMMUNITY): Payer: Self-pay

## 2018-10-14 ENCOUNTER — Encounter (HOSPITAL_COMMUNITY): Payer: Self-pay

## 2018-10-14 ENCOUNTER — Telehealth (HOSPITAL_COMMUNITY): Payer: Self-pay | Admitting: *Deleted

## 2018-10-14 NOTE — Telephone Encounter (Signed)
Called to update patient re: maintenance Cardiac Rehab exercise classes remain on hold at this time due to COVID-19 pandemic precautions.

## 2018-10-19 ENCOUNTER — Encounter (HOSPITAL_COMMUNITY): Payer: Self-pay

## 2018-10-28 ENCOUNTER — Telehealth (HOSPITAL_COMMUNITY): Payer: Self-pay | Admitting: *Deleted

## 2018-10-28 NOTE — Telephone Encounter (Signed)
Patient went into afib this afternoon. HR in the 100-140s BP 112/73 He will take extra 2.5mg  of bisoprolol now and call tomorrow if still in AF to be seen. Pt in agreement.

## 2018-10-29 NOTE — Telephone Encounter (Signed)
Pt continues in AF his HR is more controlled today in the 80-90s with addition of bisoprolol in the evening - he would like to continue this over the weekend and call on Monday if still in AF.

## 2018-11-01 ENCOUNTER — Encounter (HOSPITAL_COMMUNITY): Payer: Self-pay | Admitting: Physician Assistant

## 2018-11-01 ENCOUNTER — Ambulatory Visit (HOSPITAL_COMMUNITY)
Admission: RE | Admit: 2018-11-01 | Discharge: 2018-11-01 | Disposition: A | Discharge status: Home / Self Care | Payer: Medicare Other | Source: Ambulatory Visit | Attending: Physician Assistant | Admitting: Physician Assistant

## 2018-11-01 ENCOUNTER — Other Ambulatory Visit: Payer: Self-pay

## 2018-11-01 VITALS — BP 110/72 | HR 91 | Ht 68.0 in | Wt 176.8 lb

## 2018-11-01 DIAGNOSIS — Z7901 Long term (current) use of anticoagulants: Secondary | ICD-10-CM | POA: Insufficient documentation

## 2018-11-01 DIAGNOSIS — I2510 Atherosclerotic heart disease of native coronary artery without angina pectoris: Secondary | ICD-10-CM | POA: Diagnosis not present

## 2018-11-01 DIAGNOSIS — K900 Celiac disease: Secondary | ICD-10-CM | POA: Diagnosis not present

## 2018-11-01 DIAGNOSIS — D509 Iron deficiency anemia, unspecified: Secondary | ICD-10-CM | POA: Diagnosis not present

## 2018-11-01 DIAGNOSIS — Z8249 Family history of ischemic heart disease and other diseases of the circulatory system: Secondary | ICD-10-CM | POA: Diagnosis not present

## 2018-11-01 DIAGNOSIS — K219 Gastro-esophageal reflux disease without esophagitis: Secondary | ICD-10-CM | POA: Insufficient documentation

## 2018-11-01 DIAGNOSIS — I480 Paroxysmal atrial fibrillation: Secondary | ICD-10-CM | POA: Insufficient documentation

## 2018-11-01 DIAGNOSIS — Z8546 Personal history of malignant neoplasm of prostate: Secondary | ICD-10-CM | POA: Insufficient documentation

## 2018-11-01 DIAGNOSIS — Z79899 Other long term (current) drug therapy: Secondary | ICD-10-CM | POA: Diagnosis not present

## 2018-11-01 DIAGNOSIS — Z7982 Long term (current) use of aspirin: Secondary | ICD-10-CM | POA: Diagnosis not present

## 2018-11-01 DIAGNOSIS — E785 Hyperlipidemia, unspecified: Secondary | ICD-10-CM | POA: Insufficient documentation

## 2018-11-01 LAB — CBC
HCT: 49.2 % (ref 39.0–52.0)
Hemoglobin: 15.9 g/dL (ref 13.0–17.0)
MCH: 29.3 pg (ref 26.0–34.0)
MCHC: 32.3 g/dL (ref 30.0–36.0)
MCV: 90.8 fL (ref 80.0–100.0)
Platelets: 236 10*3/uL (ref 150–400)
RBC: 5.42 MIL/uL (ref 4.22–5.81)
RDW: 13 % (ref 11.5–15.5)
WBC: 5.7 10*3/uL (ref 4.0–10.5)
nRBC: 0 % (ref 0.0–0.2)

## 2018-11-01 LAB — BASIC METABOLIC PANEL
Anion gap: 7 (ref 5–15)
BUN: 13 mg/dL (ref 8–23)
CO2: 24 mmol/L (ref 22–32)
Calcium: 9 mg/dL (ref 8.9–10.3)
Chloride: 107 mmol/L (ref 98–111)
Creatinine, Ser: 1.03 mg/dL (ref 0.61–1.24)
GFR calc Af Amer: >60 mL/min (ref >60)
GFR calc non Af Amer: >60 mL/min (ref >60)
Glucose, Bld: 93 mg/dL (ref 70–99)
Potassium: 4.2 mmol/L (ref 3.5–5.1)
Sodium: 138 mmol/L (ref 135–145)

## 2018-11-01 NOTE — Progress Notes (Addendum)
Primary Care Physician: Derinda Late, MD Referring Physician: [facility] F/u Primary Cardiologist: [name] Primary EP: Dr Becky Sax Jose Warner is a 68 y.o. male with a h/o first onset  paroxysmal afib in 2014 following prostate surgery. Afib has been quiet since then. He was on eliquis for a period of time and then stopped for a low chadsvasc score.  He developed irregular heart beat 2/12 in the late afternoon and felt lightheaded and had some mild chest discomfort. He presented to the ER with an EKG showing afib at 119 bpm. He was cardioverted successfully and presents today with sinus brady at 48 bpm, not symptomatic with this and HR usually runs in the 50's. Trigger  may have been a recent URI treated with decongestants a week before. Also, the end of January he was switched form Bystolic 10 mg to bisoprolol due to cost. Denies snoring history, drinks one glass a wine a night, minimal caffeine.  Is active and not obese. He was started on xarelto 20 mg a day for chadsvasc score of 2  (age, cad).  F/u in afib clinic, for anther ER visit Friday, 11/1, for afib. He tells me since being seen here in early  February, after successful cardioversion, he was readmitted 2/19 with chest pain, had  a LHC and  had a stent placed in the LAD. He then had stuttering chest pain and had a repeat cath in April with CAD being stable and stent patent. He continued with chest pain and had amlodipine started by Dr. Claiborne Billings in April which helped to relieve the pain after that. He was recently seen in the  ER in  September  with chest pain thought to be 2/2 pericarditis. He was started on ibuprofen and colchicine. He then went back to the ER Friday with afib. He was told the ER physician did not want to cardiovert on top of pericarditis. Pt continues with chest discomfort but not as bad as when presented in September with chest pain. He continues on ibuprofen tid and colchicine. He does not feel that bad in  afib but does notice  the difference in his heart rate readings.  F/u in afib clinic, 2/18. When I last saw pt he had pericarditis and afib. ER was hesitant to cardiovert and I referred on to Dr. Rayann Heman. Pt had self converted by the time he saw him and no further treatment was needed. He has not had any further afib or chest pain.  Follow up in the AF clinic 11/01/18. Patient reports that four days ago, he noticed palpitations, SOB with exertion, and chest discomfort. His Apple Watch confirmed he was in afib. He took an extra 1/2 dose of bisoprolol through the weekend but remained in rate controlled afib. He notes that his chest "ache" has been constant while he has been in afib. He states that the pain is different and not nearly as severe as when he had his coronary stenting and his pericarditis. He did miss one dose of Xarelto about 3 weeks ago.   Today, he denies symptoms of  orthopnea, PND, lower extremity edema, dizziness, presyncope, syncope, or neurologic sequela. The patient is tolerating medications without difficulties and is otherwise without complaint today.   Past Medical History:  Diagnosis Date   Anemia    Bronchitis 05-10-12   past fall- 4 runs antibiotics due to bronchitis-uses Pro Air as needed   Celiac disease    Colon polyps  adenomatous   Coronary artery disease    Diverticulosis    GERD (gastroesophageal reflux disease)    Hepatitis 05-10-12   "was told non A, non B"-unclean dental equip.   Hyperlipidemia    Iron deficiency anemia    Pericarditis    Persistent atrial fibrillation 06/05/2017   Prostate cancer ([place]) 05-10-12   ;bx. 04-12-12, dx   Spasm of esophagus 2013   Past Surgical History:  Procedure Laterality Date   CARDIAC CATHETERIZATION  09/13/2004   LAD:30%-40% in prox. to mid segment with 20% in the mid segment and 20% in left Circ., mild 20% right common iliac narrowing. medical therapy   CARDIOVERSION  06/05/2017   CORONARY STENT  INTERVENTION N/A 06/11/2017   Procedure: CORONARY STENT INTERVENTION;  Surgeon: Belva Crome, MD;  Location: [facility];  Service: Cardiovascular;  Laterality: N/A;   HERNIA REPAIR  5 yrs ago   right    LEFT HEART CATH AND CORONARY ANGIOGRAPHY N/A 06/11/2017   Procedure: LEFT HEART CATH AND CORONARY ANGIOGRAPHY;  Surgeon: Belva Crome, MD;  Location: [facility];  Service: Cardiovascular;  Laterality: N/A;   LEFT HEART CATH AND CORONARY ANGIOGRAPHY N/A 08/18/2017   Procedure: LEFT HEART CATH AND CORONARY ANGIOGRAPHY;  Surgeon: Martinique, Peter M, MD;  Location: [facility];  Service: Cardiovascular;  Laterality: N/A;   NM MYOCAR PERF WALL MOTION  11/30/2007   protocol:Bruce, post EF 67%,mild perfusion defect seen in basal inferior consistant with Attenuation artifact, exercise cap. 12METS   ROBOT ASSISTED LAPAROSCOPIC RADICAL PROSTATECTOMY  05/20/2012   Procedure: ROBOTIC ASSISTED LAPAROSCOPIC RADICAL PROSTATECTOMY LEVEL 1;  Surgeon: Dutch Gray, MD;  Location: [facility];  Service: Urology;  Laterality: N/A;      TRANSURETHRAL RESECTION OF PROSTATE  26yr ago    Current Outpatient Medications  Medication Sig Dispense Refill   amLODipine (NORVASC) 2.5 MG tablet Take 1 tablet (2.5 mg total) by mouth daily. (Patient taking differently: Take 2.5 mg by mouth every evening. ) 90 tablet 3   aspirin EC 81 MG tablet Take 1 tablet (81 mg total) by mouth daily. 90 tablet 3   bisoprolol (ZEBETA) 5 MG tablet Take 5 mg by mouth daily.     ezetimibe (ZETIA) 10 MG tablet Take 10 mg by mouth every evening.      fluticasone-salmeterol (ADVAIR HFA) 115-21 MCG/ACT inhaler Inhale 2 puffs into the lungs 2 (two) times daily.     Multiple Vitamins-Minerals (MULTIVITAMIN PO) Take 1 tablet by mouth at bedtime.      niacin (NIASPAN) 1000 MG CR tablet Take 1 tablet by mouth at bedtime.      pantoprazole (PROTONIX) 40 MG tablet Take 40 mg by mouth 2 (two) times daily.      pravastatin  (PRAVACHOL) 80 MG tablet Take 80 mg by mouth daily.     PROAIR HFA 108 (90 BASE) MCG/ACT inhaler Take 2 puffs by mouth 2 (two) times daily as needed. For shortness of breath.     rivaroxaban (XARELTO) 20 MG TABS tablet Take 1 tablet (20 mg total) by mouth daily with supper. 90 tablet 1   Vitamin D, Ergocalciferol, 2000 units CAPS Take 1 capsule by mouth at bedtime.      ascorbic acid (VITAMIN C) 500 MG tablet Take 500 mg by mouth every evening.      nitroGLYCERIN (NITROSTAT) 0.4 MG SL tablet Place 1 tablet (0.4 mg total) under the tongue every 5 (five) minutes x 3 doses as needed  for chest pain. 30 tablet 0   Current Facility-Administered Medications  Medication Dose Route Frequency Provider Last Rate Last Dose   0.9 %  sodium chloride infusion  500 mL Intravenous Once Irene Shipper, MD        Allergies  Allergen Reactions   Gluten Meal Other (See Comments)    Celiac, stomach crams , gas   Wheat Bran Other (See Comments)    Celiac    Statins     Leg muscle pains - rosuvastatin, atorvastatin, zetia. Tolerates simvastatin   Sulfa Antibiotics Rash    Social History   Socioeconomic History   Marital status: Married    Spouse name: Not on file   Number of children: 3   Years of education: Not on file   Highest education level: Not on file  Occupational History    Employer: [employer]  Social Needs   Financial resource strain: Not on file   Food insecurity    Worry: Patient refused    Inability: Patient refused   Transportation needs    Medical: Patient refused    Non-medical: Patient refused  Tobacco Use   Smoking status: Never Smoker   Smokeless tobacco: Never Used  Substance and Sexual Activity   Alcohol use: Never    Alcohol/week: 2.0 standard drinks    Types: 2 Glasses of wine per week    Frequency: Never   Drug use: No   Sexual activity: Yes    Partners: Female  Lifestyle   Physical activity    Days per week: Not on file    Minutes per  session: Not on file   Stress: Not on file  Relationships   Social connections    Talks on phone: Patient refused    Gets together: Patient refused    Attends religious service: Patient refused    Active member of club or organization: Patient refused    Attends meetings of clubs or organizations: Patient refused    Relationship status: Patient refused   Intimate partner violence    Fear of current or ex partner: Patient refused    Emotionally abused: Patient refused    Physically abused: Patient refused    Forced sexual activity: Patient refused  Other Topics Concern   Not on file  Social History Narrative   Not on file    Family History  Problem Relation Age of Onset   Stroke Mother    Heart disease Father    Heart attack Father    Cancer Sister    Stroke Maternal Grandfather    Cancer Paternal Grandmother    Heart attack Paternal Grandfather    Heart disease Paternal Grandfather    Colon cancer Neg Hx    Esophageal cancer Neg Hx    Pancreatic cancer Neg Hx    Rectal cancer Neg Hx    Stomach cancer Neg Hx     ROS- All systems are reviewed and negative except as per the HPI above  Physical Exam: Vitals:   11/01/18 1335  BP: 110/72  Pulse: 91  Weight: 80.2 kg  Height: 5' 8"  (1.727 m)   Wt Readings from Last 3 Encounters:  11/01/18 80.2 kg  06/23/18 81.9 kg  06/17/18 80.3 kg    Labs: Lab Results  Component Value Date   NA 140 02/19/2018   K 4.2 02/19/2018   CL 104 02/19/2018   CO2 26 02/19/2018   GLUCOSE 111 (H) 02/19/2018   BUN 14 02/19/2018   CREATININE 1.12 02/19/2018  CALCIUM 9.3 02/19/2018   PHOS 3.7 12/28/2017   MG 2.3 02/19/2018   Lab Results  Component Value Date   INR 1.18 06/11/2017   Lab Results  Component Value Date   CHOL 122 06/10/2017   HDL 41 06/10/2017   LDLCALC 60 06/10/2017   TRIG 103 06/10/2017    GEN- The patient is well appearing, alert and oriented x 3 today.   HEENT-head normocephalic,  atraumatic, sclera clear, conjunctiva pink, hearing intact, trachea midline. Lungs- Clear to ausculation bilaterally, normal work of breathing Heart- irregular rate and rhythm, no murmurs, rubs or gallops  GI- soft, NT, ND, + BS Extremities- no clubbing, cyanosis, or edema MS- no significant deformity or atrophy Skin- no rash or lesion Psych- euthymic mood, full affect Neuro- strength and sensation are intact   EKG- afib HR 91, QRS 88, QTc 464  Epic records reviewed Cardiac CT- 9/10-IMPRESSION: 1. Normal left ventricular size, thickness and hyperdynamic systolic function (LVEF = 71%). There are no regional wall motion abnormalities.  There is no late gadolinium enhancement in the left ventricular myocardium.  2. Normal right ventricular size, thickness and systolic function (LVEF = 73%). There are no regional wall motion abnormalities.  3. Mildly dilated left atrium. There is atypical LGE of the left and right atrial walls suspicious for fibrosis.  4. Normal size of the aortic root, ascending aorta and pulmonary artery.  5.  Mild aortic and mitral and trivial tricuspid regurgitation.  6. No pericardial effusion. There is mild late gadolinium enhancement adjacent to the inferolateral and anterior left ventricular walls and RV free wall.  Collectively, these findings are consistent with an acute pericarditis. LVEF has improved when compared to the prior echocardiogram on 06/10/2017 from 50-55% to 71%.   Assessment and Plan: 1. Paroxysmal atrial fibrillation Previously spontaneously converted after 6 days of having afib in the setting of pericarditis Suspect afib is the cause of his symptoms.  We discussed therapeutic options including DCCV vs DCCV +AAD such as Multaq, dofetilide, or amiodarone. For now, patient prefers to try DCCV alone. Will go ahead and arrange. Continue  xarelto 20 mg daily Continue bisoprolol 5 mg daily with extra 2.5 mg daily until DCCV. If  symptoms do not resolve with restoration of SR, will likely need further risk stratification/ischemic workup.  Patient instructed to go to ER if he should develop worsening CP, SOB, or syncope. Patient voices understanding and is in agreement with plan.  This patients CHA2DS2-VASc Score and unadjusted Ischemic Stroke Rate (% per year) is equal to 2.2 % stroke rate/year from a score of 2  Above score calculated as 1 point each if present [CHF, HTN, DM, Vascular=MI/PAD/Aortic Plaque, Age if 65-74, or Male] Above score calculated as 2 points each if present [Age > 75, or Stroke/TIA/TE]  2. CAD DES to LAD 06/11/17. Continue present therapy and risk factor modification. On ASA 81 mg daily. See plan above.   F/u in AF clinic one week post DCCV.   [facility] [address] [place], [place] [address] [phone]

## 2018-11-01 NOTE — Patient Instructions (Signed)
Cardioversion scheduled for Thursday, July 23rd  - Arrive at the [facility] and go to admitting at 1:30PM  -Do not eat or drink anything after midnight the night prior to your procedure.  - Take all your morning  medication with a sip of water prior to arrival.  - You will not be able to drive home after your procedure.   Day of cardioversion return to normal dosing of zebeta.

## 2018-11-08 ENCOUNTER — Other Ambulatory Visit (HOSPITAL_COMMUNITY)
Admission: RE | Admit: 2018-11-08 | Discharge: 2018-11-08 | Disposition: A | Discharge status: Home / Self Care | Payer: Medicare Other | Source: Ambulatory Visit | Attending: Internal Medicine | Admitting: Internal Medicine

## 2018-11-08 DIAGNOSIS — Z1159 Encounter for screening for other viral diseases: Secondary | ICD-10-CM | POA: Insufficient documentation

## 2018-11-08 LAB — SARS CORONAVIRUS 2 (TAT 6-24 HRS): SARS Coronavirus 2: NEGATIVE

## 2018-11-10 NOTE — Progress Notes (Signed)
Spoke with patient who states that he has remained quarantined since his COVID screening and is currently not experiencing any s/s of COVID 19. All questions answered concerning the procedure and pt verbalized understanding. Jobe Igo, RN

## 2018-11-11 ENCOUNTER — Ambulatory Visit (HOSPITAL_COMMUNITY): Payer: Medicare Other | Admitting: Certified Registered Nurse Anesthetist

## 2018-11-11 ENCOUNTER — Ambulatory Visit (HOSPITAL_COMMUNITY)
Admission: RE | Admit: 2018-11-11 | Discharge: 2018-11-11 | Disposition: A | Discharge status: Home / Self Care | Payer: Medicare Other | Attending: Internal Medicine | Admitting: Internal Medicine

## 2018-11-11 ENCOUNTER — Encounter (HOSPITAL_COMMUNITY)
Admission: RE | Disposition: A | Discharge status: Home / Self Care | Payer: Medicare Other | Source: Home / Self Care | Attending: Internal Medicine

## 2018-11-11 ENCOUNTER — Encounter (HOSPITAL_COMMUNITY): Payer: Self-pay | Admitting: *Deleted

## 2018-11-11 ENCOUNTER — Other Ambulatory Visit: Payer: Self-pay

## 2018-11-11 DIAGNOSIS — I4891 Unspecified atrial fibrillation: Secondary | ICD-10-CM | POA: Diagnosis not present

## 2018-11-11 DIAGNOSIS — I2510 Atherosclerotic heart disease of native coronary artery without angina pectoris: Secondary | ICD-10-CM | POA: Insufficient documentation

## 2018-11-11 DIAGNOSIS — Z7901 Long term (current) use of anticoagulants: Secondary | ICD-10-CM | POA: Diagnosis not present

## 2018-11-11 DIAGNOSIS — I480 Paroxysmal atrial fibrillation: Secondary | ICD-10-CM | POA: Insufficient documentation

## 2018-11-11 DIAGNOSIS — Z7982 Long term (current) use of aspirin: Secondary | ICD-10-CM | POA: Diagnosis not present

## 2018-11-11 DIAGNOSIS — K219 Gastro-esophageal reflux disease without esophagitis: Secondary | ICD-10-CM | POA: Diagnosis not present

## 2018-11-11 DIAGNOSIS — Z8546 Personal history of malignant neoplasm of prostate: Secondary | ICD-10-CM | POA: Insufficient documentation

## 2018-11-11 DIAGNOSIS — E785 Hyperlipidemia, unspecified: Secondary | ICD-10-CM | POA: Insufficient documentation

## 2018-11-11 DIAGNOSIS — Z79899 Other long term (current) drug therapy: Secondary | ICD-10-CM | POA: Insufficient documentation

## 2018-11-11 HISTORY — PX: CARDIOVERSION: SHX1299

## 2018-11-11 SURGERY — CARDIOVERSION
Anesthesia: General

## 2018-11-11 MED ORDER — SODIUM CHLORIDE 0.9 % IV SOLN
INTRAVENOUS | Status: DC
  Administered 2018-11-11: 14:00:00 via INTRAVENOUS

## 2018-11-11 MED ORDER — PROPOFOL 10 MG/ML IV BOLUS
INTRAVENOUS | Status: DC | PRN
  Administered 2018-11-11: 70 mg via INTRAVENOUS

## 2018-11-11 MED ORDER — LIDOCAINE 2% (20 MG/ML) 5 ML SYRINGE
INTRAMUSCULAR | Status: DC | PRN
  Administered 2018-11-11: 80 mg via INTRAVENOUS

## 2018-11-11 NOTE — Transfer of Care (Signed)
Immediate Anesthesia Transfer of Care Note  Patient: Jose Warner  Procedure(s) Performed: CARDIOVERSION (N/A )  Patient Location: Endoscopy Unit  Anesthesia Type:MAC  Level of Consciousness: awake, alert , oriented and drowsy  Airway & Oxygen Therapy: Patient Spontanous Breathing and Patient connected to nasal cannula oxygen  Post-op Assessment: Report given to RN and Post -op Vital signs reviewed and stable  Post vital signs: Reviewed and stable  Last Vitals:  Vitals Value Taken Time  BP    Temp    Pulse    Resp    SpO2      Last Pain:  Vitals:   11/11/18 1328  TempSrc: Temporal  PainSc: 2          Complications: No apparent anesthesia complications

## 2018-11-11 NOTE — Anesthesia Procedure Notes (Signed)
Procedure Name: MAC Date/Time: 11/11/2018 1:48 PM Performed by: Colin Benton, CRNA Pre-anesthesia Checklist: Patient identified, Emergency Drugs available, Suction available and Patient being monitored Patient Re-evaluated:Patient Re-evaluated prior to induction Oxygen Delivery Method: Nasal cannula Induction Type: IV induction Placement Confirmation: positive ETCO2 Dental Injury: Teeth and Oropharynx as per pre-operative assessment

## 2018-11-11 NOTE — H&P (Signed)
   INTERVAL PROCEDURE H&P  History and Physical Interval Note:  11/11/2018 1:34 PM  Jose Warner has presented today for their planned procedure. The various methods of treatment have been discussed with the patient and family. After consideration of risks, benefits and other options for treatment, the patient has consented to the procedure.  The patients' outpatient history has been reviewed, patient examined, and no change in status from most recent office note within the past 30 days. I have reviewed the patients' chart and labs and will proceed as planned. Questions were answered to the patient's satisfaction.   Pixie Casino, MD, [facility], [place] Director of the Advanced Lipid Disorders &  Cardiovascular Risk Reduction Clinic Diplomate of the American Board of Clinical Lipidology Attending Cardiologist  Direct Dial: [phone]  Fax: [phone]  Website:  [email or web address] 11/11/2018, 1:34 PM

## 2018-11-11 NOTE — Anesthesia Postprocedure Evaluation (Signed)
Anesthesia Post Note  Patient: Jose Warner  Procedure(s) Performed: CARDIOVERSION (N/A )     Patient location during evaluation: PACU Anesthesia Type: General Level of consciousness: awake and alert Pain management: pain level controlled Vital Signs Assessment: post-procedure vital signs reviewed and stable Respiratory status: spontaneous breathing, nonlabored ventilation, respiratory function stable and patient connected to nasal cannula oxygen Cardiovascular status: blood pressure returned to baseline and stable Postop Assessment: no apparent nausea or vomiting Anesthetic complications: no    Last Vitals:  Vitals:   11/11/18 1404 11/11/18 1414  BP: 113/63 102/65  Pulse: 71 68  Resp: 17 14  Temp:    SpO2: 97% 94%    Last Pain:  Vitals:   11/11/18 1354  TempSrc: Temporal  PainSc: 0-No pain                 Veona Bittman S

## 2018-11-11 NOTE — CV Procedure (Signed)
   CARDIOVERSION NOTE  Procedure: Electrical Cardioversion Indications:  Atrial Fibrillation  Procedure Details:  Consent: Risks of procedure as well as the alternatives and risks of each were explained to the (patient/caregiver).  Consent for procedure obtained.  Time Out: Verified patient identification, verified procedure, site/side was marked, verified correct patient position, special equipment/implants available, medications/allergies/relevent history reviewed, required imaging and test results available.  Performed  Patient placed on cardiac monitor, pulse oximetry, supplemental oxygen as necessary.  Sedation given: propofol per anesthesia Pacer pads placed anterior and posterior chest.  Cardioverted 1 time(s).  Cardioverted at 150J biphasic.  Impression: Findings: Post procedure EKG shows: NSR Complications: None Patient did tolerate procedure well.  Plan: 1. Successful DCCV with a single 150J Biphasic shock.  Time Spent Directly with the Patient:  30 minutes   Pixie Casino, MD, [facility], [place] Director of the Advanced Lipid Disorders &  Cardiovascular Risk Reduction Clinic Diplomate of the American Board of Clinical Lipidology Attending Cardiologist  Direct Dial: [phone]  Fax: [phone]  Website:  [email or web address] 11/11/2018, 1:54 PM

## 2018-11-11 NOTE — Discharge Instructions (Signed)
°  YOU HAD AN CARDIAC PROCEDURE TODAY: Refer to the procedure report and other information in the discharge instructions given to you for any specific questions about what was found during the examination. If this information does not answer your questions, please call [facility] office at [phone] to clarify.  ° °DIET: Your first meal following the procedure should be a light meal and then it is ok to progress to your normal diet. A half-sandwich or bowl of soup is an example of a good first meal. Heavy or fried foods are harder to digest and may make you feel nauseous or bloated. Drink plenty of fluids but you should avoid alcoholic beverages for 24 hours. If you had a esophageal dilation, please see attached instructions for diet.  ° °ACTIVITY: Your care partner should take you home directly after the procedure. You should plan to take it easy, moving slowly for the rest of the day. You can resume normal activity the day after the procedure however YOU SHOULD NOT DRIVE, use power tools, machinery or perform tasks that involve climbing or major physical exertion for 24 hours (because of the sedation medicines used during the test).  ° °SYMPTOMS TO REPORT IMMEDIATELY: °A cardiologist can be reached at any hour. Please call [phone] for any of the following symptoms:  °Vomiting of blood or coffee ground material  °New, significant abdominal pain  °New, significant chest pain or pain under the shoulder blades  °Painful or persistently difficult swallowing  °New shortness of breath  °Black, tarry-looking or red, bloody stools ° °FOLLOW UP:  °Please also call with any specific questions about appointments or follow up tests. ° ° °

## 2018-11-11 NOTE — Anesthesia Preprocedure Evaluation (Signed)
Anesthesia Evaluation  Patient identified by MRN, date of birth, ID band Patient awake    Reviewed: Allergy & Precautions, NPO status , Patient's Chart, lab work & pertinent test results  Airway Mallampati: II  TM Distance: >3 FB Neck ROM: Full    Dental no notable dental hx.    Pulmonary neg pulmonary ROS,    Pulmonary exam normal breath sounds clear to auscultation       Cardiovascular + CAD and + Cardiac Stents  + dysrhythmias Atrial Fibrillation  Rhythm:Irregular     Neuro/Psych negative neurological ROS  negative psych ROS   GI/Hepatic Neg liver ROS, GERD  ,  Endo/Other  negative endocrine ROS  Renal/GU negative Renal ROS  negative genitourinary   Musculoskeletal negative musculoskeletal ROS (+)   Abdominal   Peds negative pediatric ROS (+)  Hematology negative hematology ROS (+)   Anesthesia Other Findings   Reproductive/Obstetrics negative OB ROS                             Anesthesia Physical Anesthesia Plan  ASA: III  Anesthesia Plan: General   Post-op Pain Management:    Induction: Intravenous  PONV Risk Score and Plan: 0  Airway Management Planned: Mask  Additional Equipment:   Intra-op Plan:   Post-operative Plan: Extubation in OR  Informed Consent: I have reviewed the patients History and Physical, chart, labs and discussed the procedure including the risks, benefits and alternatives for the proposed anesthesia with the patient or authorized representative who has indicated his/her understanding and acceptance.     Dental advisory given  Plan Discussed with: CRNA and Surgeon  Anesthesia Plan Comments:         Anesthesia Quick Evaluation

## 2018-11-18 ENCOUNTER — Ambulatory Visit (HOSPITAL_COMMUNITY): Payer: Medicare Other | Admitting: Physician Assistant

## 2018-11-22 ENCOUNTER — Other Ambulatory Visit: Payer: Self-pay

## 2018-11-22 ENCOUNTER — Encounter (HOSPITAL_COMMUNITY): Payer: Self-pay | Admitting: Physician Assistant

## 2018-11-22 ENCOUNTER — Ambulatory Visit (HOSPITAL_COMMUNITY)
Admission: RE | Admit: 2018-11-22 | Discharge: 2018-11-22 | Disposition: A | Discharge status: Home / Self Care | Payer: Medicare Other | Source: Ambulatory Visit | Attending: Physician Assistant | Admitting: Physician Assistant

## 2018-11-22 VITALS — BP 104/60 | HR 54 | Ht 68.0 in | Wt 177.0 lb

## 2018-11-22 DIAGNOSIS — Z7951 Long term (current) use of inhaled steroids: Secondary | ICD-10-CM | POA: Diagnosis not present

## 2018-11-22 DIAGNOSIS — Z7982 Long term (current) use of aspirin: Secondary | ICD-10-CM | POA: Diagnosis not present

## 2018-11-22 DIAGNOSIS — Z79899 Other long term (current) drug therapy: Secondary | ICD-10-CM | POA: Diagnosis not present

## 2018-11-22 DIAGNOSIS — I4819 Other persistent atrial fibrillation: Secondary | ICD-10-CM | POA: Diagnosis present

## 2018-11-22 DIAGNOSIS — E785 Hyperlipidemia, unspecified: Secondary | ICD-10-CM | POA: Diagnosis not present

## 2018-11-22 DIAGNOSIS — Z09 Encounter for follow-up examination after completed treatment for conditions other than malignant neoplasm: Secondary | ICD-10-CM | POA: Insufficient documentation

## 2018-11-22 DIAGNOSIS — I2510 Atherosclerotic heart disease of native coronary artery without angina pectoris: Secondary | ICD-10-CM | POA: Diagnosis not present

## 2018-11-22 DIAGNOSIS — K219 Gastro-esophageal reflux disease without esophagitis: Secondary | ICD-10-CM | POA: Insufficient documentation

## 2018-11-22 DIAGNOSIS — Z8546 Personal history of malignant neoplasm of prostate: Secondary | ICD-10-CM | POA: Diagnosis not present

## 2018-11-22 DIAGNOSIS — Z91018 Allergy to other foods: Secondary | ICD-10-CM | POA: Diagnosis not present

## 2018-11-22 DIAGNOSIS — Z8249 Family history of ischemic heart disease and other diseases of the circulatory system: Secondary | ICD-10-CM | POA: Insufficient documentation

## 2018-11-22 DIAGNOSIS — Z7901 Long term (current) use of anticoagulants: Secondary | ICD-10-CM | POA: Insufficient documentation

## 2018-11-22 DIAGNOSIS — Z882 Allergy status to sulfonamides status: Secondary | ICD-10-CM | POA: Insufficient documentation

## 2018-11-22 DIAGNOSIS — Z888 Allergy status to other drugs, medicaments and biological substances status: Secondary | ICD-10-CM | POA: Diagnosis not present

## 2018-11-22 NOTE — Progress Notes (Signed)
Primary Care Physician: Derinda Late, MD Referring Physician: [facility] F/u Primary Cardiologist: [name] Primary EP: Dr Becky Sax Jose Warner is a 68 y.o. male with a h/o first onset  paroxysmal afib in 2014 following prostate surgery. Afib has been quiet since then. He was on eliquis for a period of time and then stopped for a low chadsvasc score.  He developed irregular heart beat 2/12 in the late afternoon and felt lightheaded and had some mild chest discomfort. He presented to the ER with an EKG showing afib at 119 bpm. He was cardioverted successfully and presents today with sinus brady at 48 bpm, not symptomatic with this and HR usually runs in the 50's. Trigger  may have been a recent URI treated with decongestants a week before. Also, the end of January he was switched form Bystolic 10 mg to bisoprolol due to cost. Denies snoring history, drinks one glass a wine a night, minimal caffeine.  Is active and not obese. He was started on xarelto 20 mg a day for chadsvasc score of 2  (age, cad).  F/u in afib clinic, for anther ER visit Friday, 11/1, for afib. He tells me since being seen here in early  February, after successful cardioversion, he was readmitted 2/19 with chest pain, had  a LHC and  had a stent placed in the LAD. He then had stuttering chest pain and had a repeat cath in April with CAD being stable and stent patent. He continued with chest pain and had amlodipine started by Dr. Claiborne Billings in April which helped to relieve the pain after that. He was recently seen in the  ER in  September  with chest pain thought to be 2/2 pericarditis. He was started on ibuprofen and colchicine. He then went back to the ER Friday with afib. He was told the ER physician did not want to cardiovert on top of pericarditis. Pt continues with chest discomfort but not as bad as when presented in September with chest pain. He continues on ibuprofen tid and colchicine. He does not feel that bad in  afib but does notice  the difference in his heart rate readings.  F/u in afib clinic, 2/18. When I last saw pt he had pericarditis and afib. ER was hesitant to cardiovert and I referred on to Dr. Rayann Heman. Pt had self converted by the time he saw him and no further treatment was needed. He has not had any further afib or chest pain.  Follow up in the AF clinic 11/01/18. Patient reports that four days ago, he noticed palpitations, SOB with exertion, and chest discomfort. His Apple Watch confirmed he was in afib. He took an extra 1/2 dose of bisoprolol through the weekend but remained in rate controlled afib. He notes that his chest "ache" has been constant while he has been in afib. He states that the pain is different and not nearly as severe as when he had his coronary stenting and his pericarditis. He did miss one dose of Xarelto about 3 weeks ago.   Follow up in the AF clinic 11/22/18. S/p successful DCCV 11/11/18. Patient reports that he feels very well since his cardioversion with no further heart racing or palpitations. His SOB and fatigue have also improved.   Today, he denies symptoms of  Palpitations, chest pain, orthopnea, PND, lower extremity edema, dizziness, presyncope, syncope, or neurologic sequela. The patient is tolerating medications without difficulties and is otherwise without complaint today.  Past Medical History:  Diagnosis Date   Anemia    Bronchitis 05-10-12   past fall- 4 runs antibiotics due to bronchitis-uses Pro Air as needed   Celiac disease    Colon polyps    adenomatous   Coronary artery disease    Diverticulosis    GERD (gastroesophageal reflux disease)    Hepatitis 05-10-12   "was told non A, non B"-unclean dental equip.   Hyperlipidemia    Iron deficiency anemia    Pericarditis    Persistent atrial fibrillation 06/05/2017   Prostate cancer ([place]) 05-10-12   ;bx. 04-12-12, dx   Spasm of esophagus 2013   Past Surgical History:  Procedure  Laterality Date   CARDIAC CATHETERIZATION  09/13/2004   LAD:30%-40% in prox. to mid segment with 20% in the mid segment and 20% in left Circ., mild 20% right common iliac narrowing. medical therapy   CARDIOVERSION  06/05/2017   CARDIOVERSION N/A 11/11/2018   Procedure: CARDIOVERSION;  Surgeon: Pixie Casino, MD;  Location: [facility];  Service: Cardiovascular;  Laterality: N/A;   CORONARY STENT INTERVENTION N/A 06/11/2017   Procedure: CORONARY STENT INTERVENTION;  Surgeon: Belva Crome, MD;  Location: [facility];  Service: Cardiovascular;  Laterality: N/A;   HERNIA REPAIR  5 yrs ago   right    LEFT HEART CATH AND CORONARY ANGIOGRAPHY N/A 06/11/2017   Procedure: LEFT HEART CATH AND CORONARY ANGIOGRAPHY;  Surgeon: Belva Crome, MD;  Location: [facility];  Service: Cardiovascular;  Laterality: N/A;   LEFT HEART CATH AND CORONARY ANGIOGRAPHY N/A 08/18/2017   Procedure: LEFT HEART CATH AND CORONARY ANGIOGRAPHY;  Surgeon: Martinique, Peter M, MD;  Location: [facility];  Service: Cardiovascular;  Laterality: N/A;   NM MYOCAR PERF WALL MOTION  11/30/2007   protocol:Bruce, post EF 67%,mild perfusion defect seen in basal inferior consistant with Attenuation artifact, exercise cap. 12METS   ROBOT ASSISTED LAPAROSCOPIC RADICAL PROSTATECTOMY  05/20/2012   Procedure: ROBOTIC ASSISTED LAPAROSCOPIC RADICAL PROSTATECTOMY LEVEL 1;  Surgeon: Dutch Gray, MD;  Location: [facility];  Service: Urology;  Laterality: N/A;      TRANSURETHRAL RESECTION OF PROSTATE  65yr ago    Current Outpatient Medications  Medication Sig Dispense Refill   ADVAIR DISKUS 250-50 MCG/DOSE AEPB Inhale 1 puff into the lungs daily.     amLODipine (NORVASC) 2.5 MG tablet Take 1 tablet (2.5 mg total) by mouth daily. (Patient taking differently: Take 2.5 mg by mouth every evening. ) 90 tablet 3   ascorbic acid (VITAMIN C) 500 MG tablet Take 500 mg by mouth daily.      aspirin EC 81 MG tablet Take 1 tablet  (81 mg total) by mouth daily. 90 tablet 3   b complex vitamins tablet Take 1 tablet by mouth daily with supper.     bisoprolol (ZEBETA) 5 MG tablet Take 2.5-5 mg by mouth See admin instructions. Take 1 tablet (5 mg) by mouth daily in the morning & take 0.5 tablet (2.5 mg) by mouth in the afternoon.     ezetimibe (ZETIA) 10 MG tablet Take 10 mg by mouth every evening.      Multiple Vitamin (MULTIVITAMIN WITH MINERALS) TABS tablet Take 1 tablet by mouth daily with supper.     niacin (NIASPAN) 1000 MG CR tablet Take 1,000 mg by mouth at bedtime.      nitroGLYCERIN (NITROSTAT) 0.4 MG SL tablet Place 1 tablet (0.4 mg total) under the tongue every 5 (five) minutes x 3  doses as needed for chest pain. 30 tablet 0   pantoprazole (PROTONIX) 40 MG tablet Take 40 mg by mouth 2 (two) times daily.      pravastatin (PRAVACHOL) 80 MG tablet Take 80 mg by mouth every evening.      PROAIR HFA 108 (90 BASE) MCG/ACT inhaler Take 2 puffs by mouth 2 (two) times daily as needed. For shortness of breath.     rivaroxaban (XARELTO) 20 MG TABS tablet Take 1 tablet (20 mg total) by mouth daily with supper. 90 tablet 1   Vitamin D, Ergocalciferol, 2000 units CAPS Take 2,000 Units by mouth at bedtime.      Current Facility-Administered Medications  Medication Dose Route Frequency Provider Last Rate Last Dose   0.9 %  sodium chloride infusion  500 mL Intravenous Once Irene Shipper, MD        Allergies  Allergen Reactions   Gluten Meal Other (See Comments)    Celiac, stomach crams , gas   Wheat Bran Other (See Comments)    Celiac    Statins     Leg muscle pains - rosuvastatin, atorvastatin, zetia. Tolerates simvastatin   Sulfa Antibiotics Rash    Social History   Socioeconomic History   Marital status: Married    Spouse name: Not on file   Number of children: 3   Years of education: Not on file   Highest education level: Not on file  Occupational History    Employer: [employer]  Social  Needs   Financial resource strain: Not on file   Food insecurity    Worry: Patient refused    Inability: Patient refused   Transportation needs    Medical: Patient refused    Non-medical: Patient refused  Tobacco Use   Smoking status: Never Smoker   Smokeless tobacco: Never Used  Substance and Sexual Activity   Alcohol use: Never    Alcohol/week: 2.0 standard drinks    Types: 2 Glasses of wine per week    Frequency: Never   Drug use: No   Sexual activity: Yes    Partners: Female  Lifestyle   Physical activity    Days per week: Not on file    Minutes per session: Not on file   Stress: Not on file  Relationships   Social connections    Talks on phone: Patient refused    Gets together: Patient refused    Attends religious service: Patient refused    Active member of club or organization: Patient refused    Attends meetings of clubs or organizations: Patient refused    Relationship status: Patient refused   Intimate partner violence    Fear of current or ex partner: Patient refused    Emotionally abused: Patient refused    Physically abused: Patient refused    Forced sexual activity: Patient refused  Other Topics Concern   Not on file  Social History Narrative   Not on file    Family History  Problem Relation Age of Onset   Stroke Mother    Heart disease Father    Heart attack Father    Cancer Sister    Stroke Maternal Grandfather    Cancer Paternal Grandmother    Heart attack Paternal Grandfather    Heart disease Paternal Grandfather    Colon cancer Neg Hx    Esophageal cancer Neg Hx    Pancreatic cancer Neg Hx    Rectal cancer Neg Hx    Stomach cancer Neg Hx  ROS- All systems are reviewed and negative except as per the HPI above  Physical Exam: There were no vitals filed for this visit. Wt Readings from Last 3 Encounters:  11/11/18 176 lb (79.8 kg)  11/01/18 176 lb 12.8 oz (80.2 kg)  06/23/18 180 lb 9.6 oz (81.9 kg)     Labs: Lab Results  Component Value Date   NA 138 11/01/2018   K 4.2 11/01/2018   CL 107 11/01/2018   CO2 24 11/01/2018   GLUCOSE 93 11/01/2018   BUN 13 11/01/2018   CREATININE 1.03 11/01/2018   CALCIUM 9.0 11/01/2018   PHOS 3.7 12/28/2017   MG 2.3 02/19/2018   Lab Results  Component Value Date   INR 1.18 06/11/2017   Lab Results  Component Value Date   CHOL 122 06/10/2017   HDL 41 06/10/2017   LDLCALC 60 06/10/2017   TRIG 103 06/10/2017    GEN- The patient is well appearing, alert and oriented x 3 today.   HEENT-head normocephalic, atraumatic, sclera clear, conjunctiva pink, hearing intact, trachea midline. Lungs- Clear to ausculation bilaterally, normal work of breathing Heart- Regular rate and rhythm, no murmurs, rubs or gallops  GI- soft, NT, ND, + BS Extremities- no clubbing, cyanosis, or edema MS- no significant deformity or atrophy Skin- no rash or lesion Psych- euthymic mood, full affect Neuro- strength and sensation are intact   EKG- SB HR 54, PR 164, QRS 92, QTc 424  Epic records reviewed Cardiac CT- 9/10-IMPRESSION: 1. Normal left ventricular size, thickness and hyperdynamic systolic function (LVEF = 71%). There are no regional wall motion abnormalities.  There is no late gadolinium enhancement in the left ventricular myocardium.  2. Normal right ventricular size, thickness and systolic function (LVEF = 73%). There are no regional wall motion abnormalities.  3. Mildly dilated left atrium. There is atypical LGE of the left and right atrial walls suspicious for fibrosis.  4. Normal size of the aortic root, ascending aorta and pulmonary artery.  5.  Mild aortic and mitral and trivial tricuspid regurgitation.  6. No pericardial effusion. There is mild late gadolinium enhancement adjacent to the inferolateral and anterior left ventricular walls and RV free wall.  Collectively, these findings are consistent with an acute pericarditis.  LVEF has improved when compared to the prior echocardiogram on 06/10/2017 from 50-55% to 71%.   Assessment and Plan: 1. Persistent atrial fibrillation Previously spontaneously converted after 6 days of having afib in the setting of pericarditis S/p successful DCCV 11/11/18 We discussed AAD therapy including Multaq, dofetilide, or amiodarone. For now, patient prefers to not start medications. Continue  xarelto 20 mg daily Continue bisoprolol 5 mg daily.   This patients CHA2DS2-VASc Score and unadjusted Ischemic Stroke Rate (% per year) is equal to 2.2 % stroke rate/year from a score of 2  Above score calculated as 1 point each if present [CHF, HTN, DM, Vascular=MI/PAD/Aortic Plaque, Age if 65-74, or Male] Above score calculated as 2 points each if present [Age > 75, or Stroke/TIA/TE]  2. CAD DES to LAD 06/11/17. No anginal symptoms. Continue present therapy and risk factor modification.   F/u with [name] in 3 months. AF clinic in 6 months.   [facility] [address] [place], [place] [address] [phone]

## 2018-12-21 ENCOUNTER — Other Ambulatory Visit: Payer: Self-pay | Admitting: Cardiovascular Disease

## 2018-12-21 NOTE — Telephone Encounter (Signed)
Please review for refill. Thank you!

## 2018-12-21 NOTE — Telephone Encounter (Signed)
42m 81.9kg Scr 1.03 11/01/18 ccr 81 mlmin Lovw/kelly 06/23/18

## 2018-12-24 ENCOUNTER — Telehealth: Payer: Self-pay | Admitting: Physician Assistant

## 2018-12-24 NOTE — Telephone Encounter (Signed)
   The patient called the answering service after-hours today. He reports he went back into Afib today with erratic HR 80s-105. When this occurred in the past he was able to get it to settle down somewhat with taking an extra 1/2 bisoprolol so he plans to do that. He actually has been having a cough the last few days so has been on prednisone. He denies any CP but has had some SOB with his cough.  It could be he is having some sort of respiratory issue (or the steroid itself) that triggered the episode. No wheezing. His AF is somewhat complicated by the fact that he had recent DCCV 7/23 and is bradycardic when in NSR, so cannot be overly aggressive with his BB. Antiarrhythmics were previously discussed and may be heading that direction. Given his persistent cough and SOB I advised he talk to his PCP about getting tested for Covid and he plans to do so. I also told him given the long holiday if he is not feeling any better with the bisoprolol would have low threshold to come to the hospital for further management given the constellation of symptoms - given potential that his respiratory symptoms could represent Covid, he would not likely pass criteria to allow outpatient OV and might need telemed visit if he doesn't end up requiring hospitalization. Will route to Dr. Rayann Heman and R. Fenton with AFib clinic. The patient verbalized understanding and gratitude.  Charlie Pitter PA-C

## 2018-12-28 NOTE — Telephone Encounter (Signed)
appt made for possible AAD discussion

## 2018-12-29 ENCOUNTER — Encounter (HOSPITAL_COMMUNITY): Payer: Self-pay | Admitting: Physician Assistant

## 2018-12-29 ENCOUNTER — Ambulatory Visit (HOSPITAL_COMMUNITY)
Admission: RE | Admit: 2018-12-29 | Discharge: 2018-12-29 | Disposition: A | Discharge status: Home / Self Care | Payer: Medicare Other | Source: Ambulatory Visit | Attending: Physician Assistant | Admitting: Physician Assistant

## 2018-12-29 ENCOUNTER — Ambulatory Visit (HOSPITAL_COMMUNITY)
Admission: RE | Admit: 2018-12-29 | Discharge: 2018-12-29 | Disposition: A | Discharge status: Home / Self Care | Payer: Medicare Other | Source: Ambulatory Visit | Attending: Family Medicine | Admitting: Family Medicine

## 2018-12-29 ENCOUNTER — Ambulatory Visit
Admission: RE | Admit: 2018-12-29 | Discharge: 2018-12-29 | Disposition: A | Discharge status: Home / Self Care | Payer: Medicare Other | Source: Ambulatory Visit | Attending: Family Medicine | Admitting: Family Medicine

## 2018-12-29 ENCOUNTER — Other Ambulatory Visit (HOSPITAL_COMMUNITY): Payer: Self-pay | Admitting: *Deleted

## 2018-12-29 ENCOUNTER — Other Ambulatory Visit: Payer: Self-pay

## 2018-12-29 ENCOUNTER — Telehealth: Payer: Self-pay | Admitting: Pharmacist

## 2018-12-29 ENCOUNTER — Other Ambulatory Visit: Payer: Self-pay | Admitting: Family Medicine

## 2018-12-29 VITALS — BP 122/60 | HR 93 | Ht 68.0 in | Wt 175.4 lb

## 2018-12-29 DIAGNOSIS — I2510 Atherosclerotic heart disease of native coronary artery without angina pectoris: Secondary | ICD-10-CM | POA: Diagnosis not present

## 2018-12-29 DIAGNOSIS — E785 Hyperlipidemia, unspecified: Secondary | ICD-10-CM | POA: Insufficient documentation

## 2018-12-29 DIAGNOSIS — Z7982 Long term (current) use of aspirin: Secondary | ICD-10-CM | POA: Insufficient documentation

## 2018-12-29 DIAGNOSIS — Z7951 Long term (current) use of inhaled steroids: Secondary | ICD-10-CM | POA: Diagnosis not present

## 2018-12-29 DIAGNOSIS — Z8546 Personal history of malignant neoplasm of prostate: Secondary | ICD-10-CM | POA: Insufficient documentation

## 2018-12-29 DIAGNOSIS — Z79899 Other long term (current) drug therapy: Secondary | ICD-10-CM | POA: Insufficient documentation

## 2018-12-29 DIAGNOSIS — R059 Cough, unspecified: Secondary | ICD-10-CM

## 2018-12-29 DIAGNOSIS — I4819 Other persistent atrial fibrillation: Secondary | ICD-10-CM | POA: Diagnosis not present

## 2018-12-29 DIAGNOSIS — R05 Cough: Secondary | ICD-10-CM

## 2018-12-29 DIAGNOSIS — Z7901 Long term (current) use of anticoagulants: Secondary | ICD-10-CM | POA: Diagnosis not present

## 2018-12-29 DIAGNOSIS — Z888 Allergy status to other drugs, medicaments and biological substances status: Secondary | ICD-10-CM | POA: Diagnosis not present

## 2018-12-29 DIAGNOSIS — Z955 Presence of coronary angioplasty implant and graft: Secondary | ICD-10-CM | POA: Diagnosis not present

## 2018-12-29 DIAGNOSIS — K219 Gastro-esophageal reflux disease without esophagitis: Secondary | ICD-10-CM | POA: Insufficient documentation

## 2018-12-29 DIAGNOSIS — Z9079 Acquired absence of other genital organ(s): Secondary | ICD-10-CM | POA: Diagnosis not present

## 2018-12-29 DIAGNOSIS — Z882 Allergy status to sulfonamides status: Secondary | ICD-10-CM | POA: Diagnosis not present

## 2018-12-29 DIAGNOSIS — I480 Paroxysmal atrial fibrillation: Secondary | ICD-10-CM | POA: Diagnosis present

## 2018-12-29 NOTE — Telephone Encounter (Signed)
Medication list reviewed in anticipation of upcoming Tikosyn initiation. Patient is not taking any contraindicated or QTc prolonging medications.   Patient is anticoagulated on Xarelto 20mg  daily on the appropriate dose. Please ensure that patient has not missed any anticoagulation doses in the 3 weeks prior to Tikosyn initiation.   K and Mg both at goal.  Patient will need to be counseled to avoid use of Benadryl while on Tikosyn and in the 2-3 days prior to Tikosyn initiation.

## 2018-12-29 NOTE — Progress Notes (Signed)
Primary Care Physician: Derinda Late, MD Referring Physician: [facility] F/u Primary Cardiologist: [name] Primary EP: Dr Becky Sax Jose Warner is a 68 y.o. male with a h/o first onset  paroxysmal afib in 2014 following prostate surgery. Afib has been quiet since then. He was on eliquis for a period of time and then stopped for a low chadsvasc score.  He developed irregular heart beat 2/12 in the late afternoon and felt lightheaded and had some mild chest discomfort. He presented to the ER with an EKG showing afib at 119 bpm. He was cardioverted successfully and presents today with sinus brady at 48 bpm, not symptomatic with this and HR usually runs in the 50's. Trigger  may have been a recent URI treated with decongestants a week before. Also, the end of January he was switched form Bystolic 10 mg to bisoprolol due to cost. Denies snoring history, drinks one glass a wine a night, minimal caffeine.  Is active and not obese. He was started on xarelto 20 mg a day for chadsvasc score of 2  (age, cad).  F/u in afib clinic, for anther ER visit Friday, 11/1, for afib. He tells me since being seen here in early  February, after successful cardioversion, he was readmitted 2/19 with chest pain, had  a LHC and  had a stent placed in the LAD. He then had stuttering chest pain and had a repeat cath in April with CAD being stable and stent patent. He continued with chest pain and had amlodipine started by Dr. Claiborne Billings in April which helped to relieve the pain after that. He was recently seen in the  ER in  September  with chest pain thought to be 2/2 pericarditis. He was started on ibuprofen and colchicine. He then went back to the ER Friday with afib. He was told the ER physician did not want to cardiovert on top of pericarditis. Pt continues with chest discomfort but not as bad as when presented in September with chest pain. He continues on ibuprofen tid and colchicine. He does not feel that bad in  afib but does notice  the difference in his heart rate readings.  F/u in afib clinic, 2/18. When I last saw pt he had pericarditis and afib. ER was hesitant to cardiovert and I referred on to Dr. Rayann Heman. Pt had self converted by the time he saw him and no further treatment was needed. He has not had any further afib or chest pain.  Follow up in the AF clinic 11/01/18. Patient reports that four days ago, he noticed palpitations, SOB with exertion, and chest discomfort. His Apple Watch confirmed he was in afib. He took an extra 1/2 dose of bisoprolol through the weekend but remained in rate controlled afib. He notes that his chest "ache" has been constant while he has been in afib. He states that the pain is different and not nearly as severe as when he had his coronary stenting and his pericarditis. He did miss one dose of Xarelto about 3 weeks ago.   Follow up in the AF clinic 11/22/18. S/p successful DCCV 11/11/18. Patient reports that he feels very well since his cardioversion with no further heart racing or palpitations. His SOB and fatigue have also improved.   Follow up in the AF clinic 12/29/18. Patient reports that he went back into afib on 12/24/18 with irregular heart beat, fatigue, SOB. He did have a persistent cough (COVID negative) which was treated with  prednisone. He remains in afib today.  Today, he denies symptoms of chest pain, orthopnea, PND, lower extremity edema, dizziness, presyncope, syncope, or neurologic sequela. The patient is tolerating medications without difficulties and is otherwise without complaint today. +fatigue, SOB  Past Medical History:  Diagnosis Date   Anemia    Bronchitis 05-10-12   past fall- 4 runs antibiotics due to bronchitis-uses [employer] as needed   Celiac disease    Colon polyps    adenomatous   Coronary artery disease    Diverticulosis    GERD (gastroesophageal reflux disease)    Hepatitis 05-10-12   "was told non A, non B"-unclean dental equip.    Hyperlipidemia    Iron deficiency anemia    Pericarditis    Persistent atrial fibrillation 06/05/2017   Prostate cancer ([place]) 05-10-12   ;bx. 04-12-12, dx   Spasm of esophagus 2013   Past Surgical History:  Procedure Laterality Date   CARDIAC CATHETERIZATION  09/13/2004   LAD:30%-40% in prox. to mid segment with 20% in the mid segment and 20% in left Circ., mild 20% right common iliac narrowing. medical therapy   CARDIOVERSION  06/05/2017   CARDIOVERSION N/A 11/11/2018   Procedure: CARDIOVERSION;  Surgeon: Pixie Casino, MD;  Location: [place];  Service: Cardiovascular;  Laterality: N/A;   CORONARY STENT INTERVENTION N/A 06/11/2017   Procedure: CORONARY STENT INTERVENTION;  Surgeon: Belva Crome, MD;  Location: [place] [facility];  Service: Cardiovascular;  Laterality: N/A;   HERNIA REPAIR  5 yrs ago   right    LEFT HEART CATH AND CORONARY ANGIOGRAPHY N/A 06/11/2017   Procedure: LEFT HEART CATH AND CORONARY ANGIOGRAPHY;  Surgeon: Belva Crome, MD;  Location: [place] [facility];  Service: Cardiovascular;  Laterality: N/A;   LEFT HEART CATH AND CORONARY ANGIOGRAPHY N/A 08/18/2017   Procedure: LEFT HEART CATH AND CORONARY ANGIOGRAPHY;  Surgeon: Martinique, Peter M, MD;  Location: [place] [facility];  Service: Cardiovascular;  Laterality: N/A;   NM MYOCAR PERF WALL MOTION  11/30/2007   protocol:Bruce, post EF 67%,mild perfusion defect seen in basal inferior consistant with Attenuation artifact, exercise cap. 12METS   ROBOT ASSISTED LAPAROSCOPIC RADICAL PROSTATECTOMY  05/20/2012   Procedure: ROBOTIC ASSISTED LAPAROSCOPIC RADICAL PROSTATECTOMY LEVEL 1;  Surgeon: Dutch Gray, MD;  Location: [facility];  Service: Urology;  Laterality: N/A;      TRANSURETHRAL RESECTION OF PROSTATE  92yr ago    Current Outpatient Medications  Medication Sig Dispense Refill   ADVAIR DISKUS 250-50 MCG/DOSE AEPB Inhale 1 puff into the lungs daily.     amLODipine (NORVASC) 2.5 MG tablet Take 1  tablet (2.5 mg total) by mouth daily. (Patient taking differently: Take 2.5 mg by mouth every evening. ) 90 tablet 3   ascorbic acid (VITAMIN C) 500 MG tablet Take 500 mg by mouth daily.      aspirin EC 81 MG tablet Take 1 tablet (81 mg total) by mouth daily. 90 tablet 3   b complex vitamins tablet Take 1 tablet by mouth daily with supper.     bisoprolol (ZEBETA) 5 MG tablet Take 5 mg by mouth See admin instructions.      ezetimibe (ZETIA) 10 MG tablet Take 10 mg by mouth every evening.      Multiple Vitamin (MULTIVITAMIN WITH MINERALS) TABS tablet Take 1 tablet by mouth daily with supper.     niacin (NIASPAN) 1000 MG CR tablet Take 1,000 mg by mouth at bedtime.      nitroGLYCERIN (  NITROSTAT) 0.4 MG SL tablet Place 1 tablet (0.4 mg total) under the tongue every 5 (five) minutes x 3 doses as needed for chest pain. 30 tablet 0   pantoprazole (PROTONIX) 40 MG tablet Take 40 mg by mouth 2 (two) times daily.      pravastatin (PRAVACHOL) 80 MG tablet Take 80 mg by mouth every evening.      PROAIR HFA 108 (90 BASE) MCG/ACT inhaler Take 2 puffs by mouth 2 (two) times daily as needed. For shortness of breath.     Vitamin D, Ergocalciferol, 2000 units CAPS Take 2,000 Units by mouth at bedtime.      XARELTO 20 MG TABS tablet TAKE 1 TABLET DAILY WITH SUPPER 90 tablet 1   Current Facility-Administered Medications  Medication Dose Route Frequency Provider Last Rate Last Dose   0.9 %  sodium chloride infusion  500 mL Intravenous Once Irene Shipper, MD        Allergies  Allergen Reactions   Gluten Meal Other (See Comments)    Celiac, stomach crams , gas   Wheat Bran Other (See Comments)    Celiac    Statins     Leg muscle pains - rosuvastatin, atorvastatin, zetia. Tolerates simvastatin   Sulfa Antibiotics Rash    Social History   Socioeconomic History   Marital status: Married    Spouse name: Not on file   Number of children: 3   Years of education: Not on file   Highest  education level: Not on file  Occupational History    Employer: [employer]  Social Needs   Financial resource strain: Not on file   Food insecurity    Worry: Patient refused    Inability: Patient refused   Transportation needs    Medical: Patient refused    Non-medical: Patient refused  Tobacco Use   Smoking status: Never Smoker   Smokeless tobacco: Never Used  Substance and Sexual Activity   Alcohol use: Never    Alcohol/week: 2.0 standard drinks    Types: 2 Glasses of wine per week    Frequency: Never   Drug use: No   Sexual activity: Yes    Partners: Female  Lifestyle   Physical activity    Days per week: Not on file    Minutes per session: Not on file   Stress: Not on file  Relationships   Social connections    Talks on phone: Patient refused    Gets together: Patient refused    Attends religious service: Patient refused    Active member of club or organization: Patient refused    Attends meetings of clubs or organizations: Patient refused    Relationship status: Patient refused   Intimate partner violence    Fear of current or ex partner: Patient refused    Emotionally abused: Patient refused    Physically abused: Patient refused    Forced sexual activity: Patient refused  Other Topics Concern   Not on file  Social History Narrative   Not on file    Family History  Problem Relation Age of Onset   Stroke Mother    Heart disease Father    Heart attack Father    Cancer Sister    Stroke Maternal Grandfather    Cancer Paternal Grandmother    Heart attack Paternal Grandfather    Heart disease Paternal Grandfather    Colon cancer Neg Hx    Esophageal cancer Neg Hx    Pancreatic cancer Neg Hx  Rectal cancer Neg Hx    Stomach cancer Neg Hx     ROS- All systems are reviewed and negative except as per the HPI above  Physical Exam: There were no vitals filed for this visit. Wt Readings from Last 3 Encounters:  11/22/18 80.3 kg    11/11/18 79.8 kg  11/01/18 80.2 kg    Labs: Lab Results  Component Value Date   NA 138 11/01/2018   K 4.2 11/01/2018   CL 107 11/01/2018   CO2 24 11/01/2018   GLUCOSE 93 11/01/2018   BUN 13 11/01/2018   CREATININE 1.03 11/01/2018   CALCIUM 9.0 11/01/2018   PHOS 3.7 12/28/2017   MG 2.3 02/19/2018   Lab Results  Component Value Date   INR 1.18 06/11/2017   Lab Results  Component Value Date   CHOL 122 06/10/2017   HDL 41 06/10/2017   LDLCALC 60 06/10/2017   TRIG 103 06/10/2017    GEN- The patient is well appearing, alert and oriented x 3 today.   HEENT-head normocephalic, atraumatic, sclera clear, conjunctiva pink, hearing intact, trachea midline. Lungs- Clear to ausculation bilaterally, normal work of breathing Heart- irregular rate and rhythm, no murmurs, rubs or gallops  GI- soft, NT, ND, + BS Extremities- no clubbing, cyanosis, or edema MS- no significant deformity or atrophy Skin- no rash or lesion Psych- euthymic mood, full affect Neuro- strength and sensation are intact   EKG- afib HR 93, QRS 84, QTc 437  Epic records reviewed Cardiac CT- 9/10-IMPRESSION: 1. Normal left ventricular size, thickness and hyperdynamic systolic function (LVEF = 71%). There are no regional wall motion abnormalities.  There is no late gadolinium enhancement in the left ventricular myocardium.  2. Normal right ventricular size, thickness and systolic function (LVEF = 73%). There are no regional wall motion abnormalities.  3. Mildly dilated left atrium. There is atypical LGE of the left and right atrial walls suspicious for fibrosis.  4. Normal size of the aortic root, ascending aorta and pulmonary artery.  5.  Mild aortic and mitral and trivial tricuspid regurgitation.  6. No pericardial effusion. There is mild late gadolinium enhancement adjacent to the inferolateral and anterior left ventricular walls and RV free wall.  Collectively, these findings are  consistent with an acute pericarditis. LVEF has improved when compared to the prior echocardiogram on 06/10/2017 from 50-55% to 71%.   Assessment and Plan: 1. Persistent atrial fibrillation Previously spontaneously converted after 6 days of having afib in the setting of pericarditis S/p successful DCCV 11/11/18 but unfortunately has had recurrent AF. ? Prednisone related. We discussed AAD therapy including Multaq, dofetilide, or amiodarone.  Patient wants to pursue dofetilide, aware of risk vrs benefit. He will check the price of dofetilide prior to admission.  Patient will continue Xarelto 20 mg daily, states no missed doses PharmD to review medications. QTc in SR 424 ms Continue bisoprolol 5 mg daily.  Patient may be interested in ablation if he fails dofetilide.   This patients CHA2DS2-VASc Score and unadjusted Ischemic Stroke Rate (% per year) is equal to 2.2 % stroke rate/year from a score of 2  Above score calculated as 1 point each if present [CHF, HTN, DM, Vascular=MI/PAD/Aortic Plaque, Age if 65-74, or Male] Above score calculated as 2 points each if present [Age > 75, or Stroke/TIA/TE]  2. CAD DES to LAD 06/11/17. No anginal symptoms. Continue present therapy and risk factor modification.    Follow up for dofetilide admission.   Adline Peals PA-C Afib Clinic  [facility] [address] [place], [place] [address] [phone]

## 2018-12-31 ENCOUNTER — Other Ambulatory Visit (HOSPITAL_COMMUNITY)
Admission: RE | Admit: 2018-12-31 | Discharge: 2018-12-31 | Disposition: A | Discharge status: Home / Self Care | Payer: Medicare Other | Source: Ambulatory Visit | Attending: Internal Medicine | Admitting: Internal Medicine

## 2018-12-31 DIAGNOSIS — Z20828 Contact with and (suspected) exposure to other viral communicable diseases: Secondary | ICD-10-CM | POA: Diagnosis not present

## 2018-12-31 DIAGNOSIS — Z01812 Encounter for preprocedural laboratory examination: Secondary | ICD-10-CM | POA: Insufficient documentation

## 2019-01-02 LAB — NOVEL CORONAVIRUS, NAA (HOSP ORDER, SEND-OUT TO REF LAB; TAT 18-24 HRS): SARS-CoV-2, NAA: NOT DETECTED

## 2019-01-04 ENCOUNTER — Inpatient Hospital Stay (HOSPITAL_COMMUNITY)
Admission: RE | Admit: 2019-01-04 | Discharge: 2019-01-07 | DRG: 310 | Disposition: A | Discharge status: Home / Self Care | Payer: Medicare Other | Source: Ambulatory Visit | Attending: Internal Medicine | Admitting: Internal Medicine

## 2019-01-04 ENCOUNTER — Other Ambulatory Visit: Payer: Self-pay

## 2019-01-04 ENCOUNTER — Ambulatory Visit (HOSPITAL_COMMUNITY)
Admission: RE | Admit: 2019-01-04 | Discharge: 2019-01-04 | Disposition: A | Discharge status: Home / Self Care | Payer: Medicare Other | Source: Ambulatory Visit | Attending: Physician Assistant | Admitting: Physician Assistant

## 2019-01-04 ENCOUNTER — Encounter (HOSPITAL_COMMUNITY): Payer: Self-pay | Admitting: Physician Assistant

## 2019-01-04 VITALS — BP 106/66 | HR 79 | Ht 68.0 in | Wt 176.4 lb

## 2019-01-04 DIAGNOSIS — Z79899 Other long term (current) drug therapy: Secondary | ICD-10-CM

## 2019-01-04 DIAGNOSIS — K219 Gastro-esophageal reflux disease without esophagitis: Secondary | ICD-10-CM | POA: Diagnosis present

## 2019-01-04 DIAGNOSIS — Z23 Encounter for immunization: Secondary | ICD-10-CM | POA: Diagnosis not present

## 2019-01-04 DIAGNOSIS — I10 Essential (primary) hypertension: Secondary | ICD-10-CM | POA: Diagnosis present

## 2019-01-04 DIAGNOSIS — Z20828 Contact with and (suspected) exposure to other viral communicable diseases: Secondary | ICD-10-CM | POA: Diagnosis present

## 2019-01-04 DIAGNOSIS — Z7901 Long term (current) use of anticoagulants: Secondary | ICD-10-CM

## 2019-01-04 DIAGNOSIS — K900 Celiac disease: Secondary | ICD-10-CM | POA: Diagnosis present

## 2019-01-04 DIAGNOSIS — Z7982 Long term (current) use of aspirin: Secondary | ICD-10-CM

## 2019-01-04 DIAGNOSIS — Z8546 Personal history of malignant neoplasm of prostate: Secondary | ICD-10-CM | POA: Diagnosis not present

## 2019-01-04 DIAGNOSIS — I2510 Atherosclerotic heart disease of native coronary artery without angina pectoris: Secondary | ICD-10-CM | POA: Diagnosis present

## 2019-01-04 DIAGNOSIS — Z9079 Acquired absence of other genital organ(s): Secondary | ICD-10-CM | POA: Diagnosis not present

## 2019-01-04 DIAGNOSIS — I361 Nonrheumatic tricuspid (valve) insufficiency: Secondary | ICD-10-CM | POA: Diagnosis not present

## 2019-01-04 DIAGNOSIS — I4819 Other persistent atrial fibrillation: Principal | ICD-10-CM | POA: Diagnosis present

## 2019-01-04 DIAGNOSIS — Z7951 Long term (current) use of inhaled steroids: Secondary | ICD-10-CM | POA: Diagnosis not present

## 2019-01-04 DIAGNOSIS — I340 Nonrheumatic mitral (valve) insufficiency: Secondary | ICD-10-CM | POA: Diagnosis not present

## 2019-01-04 DIAGNOSIS — E785 Hyperlipidemia, unspecified: Secondary | ICD-10-CM | POA: Diagnosis present

## 2019-01-04 LAB — BASIC METABOLIC PANEL
Anion gap: 11 (ref 5–15)
BUN: 17 mg/dL (ref 8–23)
CO2: 21 mmol/L — ABNORMAL LOW (ref 22–32)
Calcium: 8.5 mg/dL — ABNORMAL LOW (ref 8.9–10.3)
Chloride: 107 mmol/L (ref 98–111)
Creatinine, Ser: 1.16 mg/dL (ref 0.61–1.24)
GFR calc Af Amer: >60 mL/min (ref >60)
GFR calc non Af Amer: >60 mL/min (ref >60)
Glucose, Bld: 96 mg/dL (ref 70–99)
Potassium: 4.3 mmol/L (ref 3.5–5.1)
Sodium: 139 mmol/L (ref 135–145)

## 2019-01-04 LAB — MAGNESIUM: Magnesium: 2.3 mg/dL (ref 1.7–2.4)

## 2019-01-04 MED ORDER — PRAVASTATIN SODIUM 40 MG PO TABS
80.0000 mg | ORAL_TABLET | Freq: Every evening | ORAL | Status: DC
  Administered 2019-01-04 – 2019-01-06 (×3): 80 mg via ORAL
  Filled 2019-01-04 (×3): qty 2

## 2019-01-04 MED ORDER — EZETIMIBE 10 MG PO TABS
10.0000 mg | ORAL_TABLET | Freq: Every evening | ORAL | Status: DC
  Administered 2019-01-04 – 2019-01-06 (×3): 10 mg via ORAL
  Filled 2019-01-04 (×3): qty 1

## 2019-01-04 MED ORDER — FLUTICASONE FUROATE-VILANTEROL 200-25 MCG/INH IN AEPB
1.0000 | INHALATION_SPRAY | Freq: Every day | RESPIRATORY_TRACT | Status: DC
  Filled 2019-01-04: qty 28

## 2019-01-04 MED ORDER — PANTOPRAZOLE SODIUM 40 MG PO TBEC
40.0000 mg | DELAYED_RELEASE_TABLET | Freq: Two times a day (BID) | ORAL | Status: DC
  Administered 2019-01-04 – 2019-01-07 (×6): 40 mg via ORAL
  Filled 2019-01-04 (×6): qty 1

## 2019-01-04 MED ORDER — RIVAROXABAN 20 MG PO TABS
20.0000 mg | ORAL_TABLET | Freq: Every day | ORAL | Status: DC
  Administered 2019-01-04 – 2019-01-06 (×3): 20 mg via ORAL
  Filled 2019-01-04 (×3): qty 1

## 2019-01-04 MED ORDER — ADULT MULTIVITAMIN W/MINERALS CH
1.0000 | ORAL_TABLET | Freq: Every day | ORAL | Status: DC
  Administered 2019-01-04 – 2019-01-06 (×3): 1 via ORAL
  Filled 2019-01-04 (×3): qty 1

## 2019-01-04 MED ORDER — VITAMIN D (ERGOCALCIFEROL) 50 MCG (2000 UT) PO CAPS: 2000.0000 [IU] | ORAL_CAPSULE | Freq: Every day | ORAL | Status: DC

## 2019-01-04 MED ORDER — DOFETILIDE 500 MCG PO CAPS
500.0000 ug | ORAL_CAPSULE | Freq: Two times a day (BID) | ORAL | Status: DC
  Administered 2019-01-04 – 2019-01-07 (×6): 500 ug via ORAL
  Filled 2019-01-04 (×6): qty 1

## 2019-01-04 MED ORDER — ASPIRIN EC 81 MG PO TBEC
81.0000 mg | DELAYED_RELEASE_TABLET | Freq: Every day | ORAL | Status: DC
  Administered 2019-01-05 – 2019-01-07 (×3): 81 mg via ORAL
  Filled 2019-01-04 (×3): qty 1

## 2019-01-04 MED ORDER — SODIUM CHLORIDE 0.9% FLUSH
3.0000 mL | Freq: Two times a day (BID) | INTRAVENOUS | Status: DC
  Administered 2019-01-04 – 2019-01-07 (×5): 3 mL via INTRAVENOUS

## 2019-01-04 MED ORDER — NITROGLYCERIN 0.4 MG SL SUBL: 0.4000 mg | SUBLINGUAL_TABLET | SUBLINGUAL | Status: DC | PRN

## 2019-01-04 MED ORDER — SODIUM CHLORIDE 0.9 % IV SOLN: 250.0000 mL | INTRAVENOUS | Status: DC | PRN

## 2019-01-04 MED ORDER — AMLODIPINE BESYLATE 2.5 MG PO TABS
2.5000 mg | ORAL_TABLET | Freq: Every evening | ORAL | Status: DC
  Administered 2019-01-04 – 2019-01-06 (×3): 2.5 mg via ORAL
  Filled 2019-01-04 (×3): qty 1

## 2019-01-04 MED ORDER — BISOPROLOL FUMARATE 5 MG PO TABS
5.0000 mg | ORAL_TABLET | Freq: Every day | ORAL | Status: DC
  Administered 2019-01-05 – 2019-01-07 (×3): 5 mg via ORAL
  Filled 2019-01-04 (×3): qty 1

## 2019-01-04 MED ORDER — VITAMIN D 25 MCG (1000 UNIT) PO TABS: 2000.0000 [IU] | ORAL_TABLET | Freq: Every day | ORAL | Status: DC

## 2019-01-04 MED ORDER — NIACIN ER (ANTIHYPERLIPIDEMIC) 500 MG PO TBCR
1000.0000 mg | EXTENDED_RELEASE_TABLET | Freq: Every day | ORAL | Status: DC
  Administered 2019-01-04 – 2019-01-06 (×3): 1000 mg via ORAL
  Filled 2019-01-04 (×4): qty 2

## 2019-01-04 MED ORDER — VITAMIN D 25 MCG (1000 UNIT) PO TABS
2000.0000 [IU] | ORAL_TABLET | Freq: Every day | ORAL | Status: DC
  Administered 2019-01-04 – 2019-01-06 (×3): 2000 [IU] via ORAL
  Filled 2019-01-04 (×3): qty 2

## 2019-01-04 MED ORDER — SODIUM CHLORIDE 0.9% FLUSH: 3.0000 mL | INTRAVENOUS | Status: DC | PRN

## 2019-01-04 MED ORDER — BISOPROLOL FUMARATE 5 MG PO TABS
2.5000 mg | ORAL_TABLET | Freq: Every day | ORAL | Status: DC
  Administered 2019-01-06: 2.5 mg via ORAL
  Filled 2019-01-04 (×3): qty 1

## 2019-01-04 MED ORDER — BISOPROLOL FUMARATE 5 MG PO TABS: 5.0000 mg | ORAL_TABLET | ORAL | Status: DC

## 2019-01-04 MED ORDER — ALBUTEROL SULFATE HFA 108 (90 BASE) MCG/ACT IN AERS: 2.0000 | INHALATION_SPRAY | Freq: Two times a day (BID) | RESPIRATORY_TRACT | Status: DC | PRN

## 2019-01-04 MED ORDER — VITAMIN C 500 MG PO TABS
500.0000 mg | ORAL_TABLET | Freq: Every day | ORAL | Status: DC
  Administered 2019-01-04 – 2019-01-07 (×4): 500 mg via ORAL
  Filled 2019-01-04 (×4): qty 1

## 2019-01-04 MED ORDER — B COMPLEX PO TABS: 1.0000 | ORAL_TABLET | Freq: Every day | ORAL | Status: DC

## 2019-01-04 NOTE — Progress Notes (Signed)
Primary Care Physician: Derinda Late, MD Referring Physician: [facility] F/u Primary Cardiologist: [name] Primary EP: Dr Becky Sax Jose Warner is a 68 y.o. male with a h/o persistent afib, HLD, CAD, and prostate cancer who presents to the AF Clinic for dofetilide admission today. Patient remains fatigued with symptoms of palpitations and SOB. He is in rate controlled afib today. He denies any missed doses of anticoagulation.  Today, he denies symptoms of chest pain, orthopnea, PND, lower extremity edema, dizziness, presyncope, syncope, or neurologic sequela. The patient is tolerating medications without difficulties and is otherwise without complaint today. +fatigue, SOB  Past Medical History:  Diagnosis Date  . Anemia   . Bronchitis 05-10-12   past fall- 4 runs antibiotics due to bronchitis-uses [employer] as needed  . Celiac disease   . Colon polyps    adenomatous  . Coronary artery disease   . Diverticulosis   . GERD (gastroesophageal reflux disease)   . Hepatitis 05-10-12   "was told non A, non B"-unclean dental equip.  Marland Kitchen Hyperlipidemia   . Iron deficiency anemia   . Pericarditis   . Persistent atrial fibrillation 06/05/2017  . Prostate cancer ([place]) 05-10-12   ;bx. 04-12-12, dx  . Spasm of esophagus 2013   Past Surgical History:  Procedure Laterality Date  . CARDIAC CATHETERIZATION  09/13/2004   LAD:30%-40% in prox. to mid segment with 20% in the mid segment and 20% in left Circ., mild 20% right common iliac narrowing. medical therapy  . CARDIOVERSION  06/05/2017  . CARDIOVERSION N/A 11/11/2018   Procedure: CARDIOVERSION;  Surgeon: Pixie Casino, MD;  Location: [facility];  Service: Cardiovascular;  Laterality: N/A;  . CORONARY STENT INTERVENTION N/A 06/11/2017   Procedure: CORONARY STENT INTERVENTION;  Surgeon: Belva Crome, MD;  Location: [place] [facility];  Service: Cardiovascular;  Laterality: N/A;  . HERNIA REPAIR  5 yrs ago   right   . LEFT  HEART CATH AND CORONARY ANGIOGRAPHY N/A 06/11/2017   Procedure: LEFT HEART CATH AND CORONARY ANGIOGRAPHY;  Surgeon: Belva Crome, MD;  Location: [place] [facility];  Service: Cardiovascular;  Laterality: N/A;  . LEFT HEART CATH AND CORONARY ANGIOGRAPHY N/A 08/18/2017   Procedure: LEFT HEART CATH AND CORONARY ANGIOGRAPHY;  Surgeon: Martinique, Peter M, MD;  Location: [place] [facility];  Service: Cardiovascular;  Laterality: N/A;  . NM MYOCAR PERF WALL MOTION  11/30/2007   protocol:Bruce, post EF 67%,mild perfusion defect seen in basal inferior consistant with Attenuation artifact, exercise cap. 12METS  . ROBOT ASSISTED LAPAROSCOPIC RADICAL PROSTATECTOMY  05/20/2012   Procedure: ROBOTIC ASSISTED LAPAROSCOPIC RADICAL PROSTATECTOMY LEVEL 1;  Surgeon: Dutch Gray, MD;  Location: [facility];  Service: Urology;  Laterality: N/A;     . TRANSURETHRAL RESECTION OF PROSTATE  65yr ago    Current Outpatient Medications  Medication Sig Dispense Refill  . ADVAIR DISKUS 250-50 MCG/DOSE AEPB Inhale 1 puff into the lungs daily.    .Marland KitchenamLODipine (NORVASC) 2.5 MG tablet Take 1 tablet (2.5 mg total) by mouth daily. (Patient taking differently: Take 2.5 mg by mouth every evening. ) 90 tablet 3  . ascorbic acid (VITAMIN C) 500 MG tablet Take 500 mg by mouth daily.     .Marland Kitchenaspirin EC 81 MG tablet Take 1 tablet (81 mg total) by mouth daily. 90 tablet 3  . b complex vitamins tablet Take 1 tablet by mouth daily with supper.    . bisoprolol (ZEBETA) 5 MG tablet  Take 5 mg by mouth See admin instructions.     Marland Kitchen ezetimibe (ZETIA) 10 MG tablet Take 10 mg by mouth every evening.     . Multiple Vitamin (MULTIVITAMIN WITH MINERALS) TABS tablet Take 1 tablet by mouth daily with supper.    . niacin (NIASPAN) 1000 MG CR tablet Take 1,000 mg by mouth at bedtime.     . nitroGLYCERIN (NITROSTAT) 0.4 MG SL tablet Place 1 tablet (0.4 mg total) under the tongue every 5 (five) minutes x 3 doses as needed for chest pain. 30 tablet 0  . pantoprazole  (PROTONIX) 40 MG tablet Take 40 mg by mouth 2 (two) times daily.     . pravastatin (PRAVACHOL) 80 MG tablet Take 80 mg by mouth every evening.     . predniSONE (DELTASONE) 10 MG tablet Take 10 mg by mouth taper from 4 doses each day to 1 dose and stop.    Marland Kitchen PROAIR HFA 108 (90 BASE) MCG/ACT inhaler Take 2 puffs by mouth 2 (two) times daily as needed. For shortness of breath.    . Vitamin D, Ergocalciferol, 2000 units CAPS Take 2,000 Units by mouth at bedtime.     Alveda Reasons 20 MG TABS tablet TAKE 1 TABLET DAILY WITH SUPPER 90 tablet 1   Current Facility-Administered Medications  Medication Dose Route Frequency Provider Last Rate Last Dose  . 0.9 %  sodium chloride infusion  500 mL Intravenous Once Irene Shipper, MD        Allergies  Allergen Reactions  . Gluten Meal Other (See Comments)    Celiac, stomach crams , gas  . Wheat Bran Other (See Comments)    Celiac   . Statins     Leg muscle pains - rosuvastatin, atorvastatin, zetia. Tolerates simvastatin  . Sulfa Antibiotics Rash    Social History   Socioeconomic History  . Marital status: Married    Spouse name: Not on file  . Number of children: 3  . Years of education: Not on file  . Highest education level: Not on file  Occupational History    Employer: [employer]  . Financial resource strain: Not on file  . Food insecurity    Worry: Patient refused    Inability: Patient refused  . Transportation needs    Medical: Patient refused    Non-medical: Patient refused  Tobacco Use  . Smoking status: Never Smoker  . Smokeless tobacco: Never Used  Substance and Sexual Activity  . Alcohol use: Never    Alcohol/week: 2.0 standard drinks    Types: 2 Glasses of wine per week    Frequency: Never  . Drug use: No  . Sexual activity: Yes    Partners: Female  Lifestyle  . Physical activity    Days per week: Not on file    Minutes per session: Not on file  . Stress: Not on file  Relationships  . Social Product manager on phone: Patient refused    Gets together: Patient refused    Attends religious service: Patient refused    Active member of club or organization: Patient refused    Attends meetings of clubs or organizations: Patient refused    Relationship status: Patient refused  . Intimate partner violence    Fear of current or ex partner: Patient refused    Emotionally abused: Patient refused    Physically abused: Patient refused    Forced sexual activity: Patient refused  Other Topics Concern  .  Not on file  Social History Narrative  . Not on file    Family History  Problem Relation Age of Onset  . Stroke Mother   . Heart disease Father   . Heart attack Father   . Cancer Sister   . Stroke Maternal Grandfather   . Cancer Paternal Grandmother   . Heart attack Paternal Grandfather   . Heart disease Paternal Grandfather   . Colon cancer Neg Hx   . Esophageal cancer Neg Hx   . Pancreatic cancer Neg Hx   . Rectal cancer Neg Hx   . Stomach cancer Neg Hx     ROS- All systems are reviewed and negative except as per the HPI above  Physical Exam: There were no vitals filed for this visit. Wt Readings from Last 3 Encounters:  12/29/18 175 lb 6.4 oz (79.6 kg)  11/22/18 177 lb (80.3 kg)  11/11/18 176 lb (79.8 kg)    Labs: Lab Results  Component Value Date   NA 138 11/01/2018   K 4.2 11/01/2018   CL 107 11/01/2018   CO2 24 11/01/2018   GLUCOSE 93 11/01/2018   BUN 13 11/01/2018   CREATININE 1.03 11/01/2018   CALCIUM 9.0 11/01/2018   PHOS 3.7 12/28/2017   MG 2.3 02/19/2018   Lab Results  Component Value Date   INR 1.18 06/11/2017   Lab Results  Component Value Date   CHOL 122 06/10/2017   HDL 41 06/10/2017   LDLCALC 60 06/10/2017   TRIG 103 06/10/2017    GEN- The patient is well appearing, alert and oriented x 3 today.   HEENT-head normocephalic, atraumatic, sclera clear, conjunctiva pink, hearing intact, trachea midline. Lungs- Clear to ausculation bilaterally,  normal work of breathing Heart- irregular rate and rhythm, no murmurs, rubs or gallops  GI- soft, NT, ND, + BS Extremities- no clubbing, cyanosis, or edema MS- no significant deformity or atrophy Skin- no rash or lesion Psych- euthymic mood, full affect Neuro- strength and sensation are intact   EKG- afib HR 79, QRS 88, QTc 419  Epic records reviewed Cardiac CT- 9/10-IMPRESSION: 1. Normal left ventricular size, thickness and hyperdynamic systolic function (LVEF = 71%). There are no regional wall motion abnormalities.  There is no late gadolinium enhancement in the left ventricular myocardium.  2. Normal right ventricular size, thickness and systolic function (LVEF = 73%). There are no regional wall motion abnormalities.  3. Mildly dilated left atrium. There is atypical LGE of the left and right atrial walls suspicious for fibrosis.  4. Normal size of the aortic root, ascending aorta and pulmonary artery.  5.  Mild aortic and mitral and trivial tricuspid regurgitation.  6. No pericardial effusion. There is mild late gadolinium enhancement adjacent to the inferolateral and anterior left ventricular walls and RV free wall.  Collectively, these findings are consistent with an acute pericarditis. LVEF has improved when compared to the prior echocardiogram on 06/10/2017 from 50-55% to 71%.   Assessment and Plan: 1. Persistent atrial fibrillation S/p successful DCCV 11/11/18 but unfortunately has had recurrent AF.  Patient wants to pursue dofetilide, aware of risk vrs benefit. Aware of price of dofetilide. No benadryl use Continue Xarelto 20 mg daily. Patient denies any missed doses.  PharmD has screened drugs and no QT prolonging drugs on board QTc in SR 424 ms Continue bisoprolol 5 mg daily Labs today show creatinine at 1.16, K+ 4.3 and mag 2.3, CrCl calculated at 70 mL/min  Patient may be interested in ablation if he  fails dofetilide.   This patients CHA2DS2-VASc  Score and unadjusted Ischemic Stroke Rate (% per year) is equal to 2.2 % stroke rate/year from a score of 2  Above score calculated as 1 point each if present [CHF, HTN, DM, Vascular=MI/PAD/Aortic Plaque, Age if 65-74, or Male] Above score calculated as 2 points each if present [Age > 75, or Stroke/TIA/TE]  2. CAD DES to LAD 06/11/17. No anginal symptoms. Continue present therapy and risk factor modification.    To be admitted later today once bed becomes available.   [facility] [address] [place], [place] [address] [phone]

## 2019-01-04 NOTE — H&P (Addendum)
Electrophysiology H&P Note    Primary Care Physician: Derinda Late, MD Referring Physician: [facility] F/u Primary Cardiologist: [name] Primary EP: Dr Becky Sax Jose Warner is a 68 y.o. male with a h/o persistent afib, HLD, CAD, and prostate cancer who presents to the [facility] for dofetilide admission today. Patient remains fatigued with symptoms of palpitations and SOB. He is in rate controlled afib today. He denies any missed doses of anticoagulation.  Today, he denies symptoms of chest pain, orthopnea, PND, lower extremity edema, dizziness, presyncope, syncope, or neurologic sequela. The patient is tolerating medications without difficulties and is otherwise without complaint today. +fatigue, SOB  Past Medical History:  Diagnosis Date  . Anemia   . Bronchitis 05-10-12   past fall- 4 runs antibiotics due to bronchitis-uses [employer] as needed  . Celiac disease   . Colon polyps    adenomatous  . Coronary artery disease   . Diverticulosis   . GERD (gastroesophageal reflux disease)   . Hepatitis 05-10-12   "was told non A, non B"-unclean dental equip.  Marland Kitchen Hyperlipidemia   . Iron deficiency anemia   . Pericarditis   . Persistent atrial fibrillation 06/05/2017  . Prostate cancer ([place]) 05-10-12   ;bx. 04-12-12, dx  . Spasm of esophagus 2013   Past Surgical History:  Procedure Laterality Date  . CARDIAC CATHETERIZATION  09/13/2004   LAD:30%-40% in prox. to mid segment with 20% in the mid segment and 20% in left Circ., mild 20% right common iliac narrowing. medical therapy  . CARDIOVERSION  06/05/2017  . CARDIOVERSION N/A 11/11/2018   Procedure: CARDIOVERSION;  Surgeon: Pixie Casino, MD;  Location: [facility] ENDOSCOPY;  Service: Cardiovascular;  Laterality: N/A;  . CORONARY STENT INTERVENTION N/A 06/11/2017   Procedure: CORONARY STENT INTERVENTION;  Surgeon: Belva Crome, MD;  Location: [place] CV LAB;  Service: Cardiovascular;  Laterality: N/A;  . HERNIA  REPAIR  5 yrs ago   right   . LEFT HEART CATH AND CORONARY ANGIOGRAPHY N/A 06/11/2017   Procedure: LEFT HEART CATH AND CORONARY ANGIOGRAPHY;  Surgeon: Belva Crome, MD;  Location: [place] CV LAB;  Service: Cardiovascular;  Laterality: N/A;  . LEFT HEART CATH AND CORONARY ANGIOGRAPHY N/A 08/18/2017   Procedure: LEFT HEART CATH AND CORONARY ANGIOGRAPHY;  Surgeon: Martinique, Peter M, MD;  Location: [place] CV LAB;  Service: Cardiovascular;  Laterality: N/A;  . NM MYOCAR PERF WALL MOTION  11/30/2007   protocol:Bruce, post EF 67%,mild perfusion defect seen in basal inferior consistant with Attenuation artifact, exercise cap. 12METS  . ROBOT ASSISTED LAPAROSCOPIC RADICAL PROSTATECTOMY  05/20/2012   Procedure: ROBOTIC ASSISTED LAPAROSCOPIC RADICAL PROSTATECTOMY LEVEL 1;  Surgeon: Dutch Gray, MD;  Location: [facility] ORS;  Service: Urology;  Laterality: N/A;     . TRANSURETHRAL RESECTION OF PROSTATE  23yr ago    Current Facility-Administered Medications  Medication Dose Route Frequency Provider Last Rate Last Dose  . 0.9 %  sodium chloride infusion  250 mL Intravenous PRN Fenton, Clint R, PA      . albuterol (VENTOLIN HFA) 108 (90 Base) MCG/ACT inhaler 2 puff  2 puff Inhalation BID PRN Fenton, Clint R, PA      . amLODipine (NORVASC) tablet 2.5 mg  2.5 mg Oral QPM Fenton, Clint R, PA      . ascorbic acid (VITAMIN C) tablet 500 mg  500 mg Oral Daily Fenton, Clint R, PA      . aspirin EC tablet 81  mg  81 mg Oral Daily Fenton, Clint R, PA      . b complex vitamins tablet 1 tablet  1 tablet Oral Q supper Fenton, Clint R, PA      . bisoprolol (ZEBETA) tablet 5 mg  5 mg Oral See admin instructions Fenton, Clint R, PA      . dofetilide (TIKOSYN) capsule 500 mcg  500 mcg Oral BID Fenton, Clint R, PA      . ezetimibe (ZETIA) tablet 10 mg  10 mg Oral QPM Fenton, Clint R, PA      . fluticasone furoate-vilanterol (BREO ELLIPTA) 200-25 MCG/INH 1 puff  1 puff Inhalation Daily Fenton, Clint R, PA      . multivitamin  with minerals tablet 1 tablet  1 tablet Oral Q supper Fenton, Clint R, PA      . niacin (NIASPAN) CR tablet 1,000 mg  1,000 mg Oral QHS Fenton, Clint R, PA      . nitroGLYCERIN (NITROSTAT) SL tablet 0.4 mg  0.4 mg Sublingual Q5 Min x 3 PRN Fenton, Clint R, PA      . pantoprazole (PROTONIX) EC tablet 40 mg  40 mg Oral BID Fenton, Clint R, PA      . pravastatin (PRAVACHOL) tablet 80 mg  80 mg Oral QPM Fenton, Clint R, PA      . rivaroxaban (XARELTO) tablet 20 mg  20 mg Oral Q supper Fenton, Clint R, PA      . sodium chloride flush (NS) 0.9 % injection 3 mL  3 mL Intravenous Q12H Fenton, Clint R, PA      . sodium chloride flush (NS) 0.9 % injection 3 mL  3 mL Intravenous PRN Fenton, Clint R, PA      . Vitamin D (Ergocalciferol) CAPS 2,000 Units  2,000 Units Oral QHS Fenton, Clint R, PA        Allergies  Allergen Reactions  . Gluten Meal Other (See Comments)    Celiac, stomach crams , gas  . Wheat Bran Other (See Comments)    Celiac   . Statins     Leg muscle pains - rosuvastatin, atorvastatin, zetia. Tolerates simvastatin  . Sulfa Antibiotics Rash    Social History   Socioeconomic History  . Marital status: Married    Spouse name: Not on file  . Number of children: 3  . Years of education: Not on file  . Highest education level: Not on file  Occupational History    Employer: [employer]  . Financial resource strain: Not on file  . Food insecurity    Worry: Patient refused    Inability: Patient refused  . Transportation needs    Medical: Patient refused    Non-medical: Patient refused  Tobacco Use  . Smoking status: Never Smoker  . Smokeless tobacco: Never Used  Substance and Sexual Activity  . Alcohol use: Never    Alcohol/week: 2.0 standard drinks    Types: 2 Glasses of wine per week    Frequency: Never  . Drug use: No  . Sexual activity: Yes    Partners: Female  Lifestyle  . Physical activity    Days per week: Not on file    Minutes per session: Not on  file  . Stress: Not on file  Relationships  . Social connections    Talks on phone: Patient refused    Gets together: Patient refused    Attends religious service: Patient refused    Active member of club  or organization: Patient refused    Attends meetings of clubs or organizations: Patient refused    Relationship status: Patient refused  . Intimate partner violence    Fear of current or ex partner: Patient refused    Emotionally abused: Patient refused    Physically abused: Patient refused    Forced sexual activity: Patient refused  Other Topics Concern  . Not on file  Social History Narrative  . Not on file    Family History  Problem Relation Age of Onset  . Stroke Mother   . Heart disease Father   . Heart attack Father   . Cancer Sister   . Stroke Maternal Grandfather   . Cancer Paternal Grandmother   . Heart attack Paternal Grandfather   . Heart disease Paternal Grandfather   . Colon cancer Neg Hx   . Esophageal cancer Neg Hx   . Pancreatic cancer Neg Hx   . Rectal cancer Neg Hx   . Stomach cancer Neg Hx     ROS- All systems are reviewed and negative except as per the HPI above  Physical Exam: Vitals:   01/04/19 1420  BP: 122/87  Pulse: 87  Temp: (!) 97.5 F (36.4 C)  TempSrc: Oral  SpO2: 100%  Weight: 80.4 kg  Height: 5' 8"  (1.727 m)   Wt Readings from Last 3 Encounters:  01/04/19 80.4 kg  01/04/19 80 kg  12/29/18 79.6 kg    Labs: Lab Results  Component Value Date   NA 139 01/04/2019   K 4.3 01/04/2019   CL 107 01/04/2019   CO2 21 (L) 01/04/2019   GLUCOSE 96 01/04/2019   BUN 17 01/04/2019   CREATININE 1.16 01/04/2019   CALCIUM 8.5 (L) 01/04/2019   PHOS 3.7 12/28/2017   MG 2.3 01/04/2019   Lab Results  Component Value Date   INR 1.18 06/11/2017   Lab Results  Component Value Date   CHOL 122 06/10/2017   HDL 41 06/10/2017   LDLCALC 60 06/10/2017   TRIG 103 06/10/2017    GEN- The patient is well appearing, alert and oriented x 3  today.   HEENT-head normocephalic, atraumatic, sclera clear, conjunctiva pink, hearing intact, trachea midline. Lungs- Clear to ausculation bilaterally, normal work of breathing Heart- irregular rate and rhythm, no murmurs, rubs or gallops  GI- soft, NT, ND, + BS Extremities- no clubbing, cyanosis, or edema MS- no significant deformity or atrophy Skin- no rash or lesion Psych- euthymic mood, full affect Neuro- strength and sensation are intact   EKG shows afib at 80 bpm, QRS 88 msec, QTc 419 ms   Epic records reviewed Cardiac CT- 9/10-IMPRESSION: 1. Normal left ventricular size, thickness and hyperdynamic systolic function (LVEF = 71%). There are no regional wall motion abnormalities.   There is no late gadolinium enhancement in the left ventricular myocardium.   2. Normal right ventricular size, thickness and systolic function (LVEF = 73%). There are no regional wall motion abnormalities.   3. Mildly dilated left atrium. There is atypical LGE of the left and right atrial walls suspicious for fibrosis.   4. Normal size of the aortic root, ascending aorta and pulmonary artery.   5.  Mild aortic and mitral and trivial tricuspid regurgitation.   6. No pericardial effusion. There is mild late gadolinium enhancement adjacent to the inferolateral and anterior left ventricular walls and RV free wall.   Collectively, these findings are consistent with an acute pericarditis. LVEF has improved when compared to the prior echocardiogram on  06/10/2017 from 50-55% to 71%.   Assessment and Plan: 1. Persistent atrial fibrillation S/p successful DCCV 11/11/18 but unfortunately has had recurrent AF.  Patient wants to pursue dofetilide, aware of risk vs benefit. Aware of price of dofetilide. Denies benadryl use Continue Xarelto 20 mg daily. Patient denies any missed doses.  PharmD has screened drugs and no QT prolonging drugs on board QTc in SR 424 ms Continue bisoprolol 5 mg daily Labs  today show creatinine at 1.16, K+ 4.3 and mag 2.3, CrCl calculated at 70 mL/min  Patient may be interested in ablation if he fails dofetilide.   CHA2DS2/VASc is 3.  (CAD, HTN, Age 58)   2. CAD DES to LAD 06/11/17. No anginal symptoms. Continue present therapy and risk factor modification.   Admitted today to tele for Tikosyn Load. Will need DCCV Thursday if does not convert on tikosyn.   Shirley Friar, PA-C  01/04/19   I have seen, examined the patient, and reviewed the above assessment and plan.  Changes to above are made where necessary.  On exam, iRRR.  symptomatic persistent afib.  I would advise initiation of tikosyn at this time.  We will admit for medical management of afib.  Co Sign: Thompson Grayer, MD 01/04/2019 5:45 PM

## 2019-01-04 NOTE — Progress Notes (Signed)
Pharmacy: Dofetilide (Tikosyn) - Initial Consult Assessment and Electrolyte Replacement  Pharmacy consulted to assist in monitoring and replacing electrolytes in this 68 y.o. male admitted on 01/04/2019 undergoing dofetilide initiation. First dofetilide dose: planned for 9/15 PM  Assessment:  Patient Exclusion Criteria: If any screening criteria checked as "Yes", then  patient  should NOT receive dofetilide until criteria item is corrected.  If "Yes" please indicate correction plan.  YES  NO Patient  Exclusion Criteria Correction Plan   []   [x]   Baseline QTc interval is greater than or equal to 440 msec. IF above YES box checked dofetilide contraindicated unless patient has ICD; then may proceed if QTc 500-550 msec or with known ventricular conduction abnormalities may proceed with QTc 550-600 msec. QTc =      []   [x]   Patient is known or suspected to have a digoxin level greater than 2 ng/ml: No results found for: DIGOXIN     []   [x]   Creatinine clearance less than 20 ml/min (calculated using Cockcroft-Gault, actual body weight and serum creatinine): Estimated Creatinine Clearance: 59.8 mL/min (by C-G formula based on SCr of 1.16 mg/dL).     []   [x]  Patient has received drugs known to prolong the QT intervals within the last 48 hours (phenothiazines, tricyclics or tetracyclic antidepressants, erythromycin, H-1 antihistamines, cisapride, fluoroquinolones, azithromycin). Updated information on QT prolonging agents is available to be searched on the following database:QT prolonging agents     []   [x]   Patient received a dose of hydrochlorothiazide (Oretic) alone or in any combination including triamterene (Dyazide, Maxzide) in the last 48 hours.    []   [x]  Patient received a medication known to increase dofetilide plasma concentrations prior to initial dofetilide dose:  . Trimethoprim (Primsol, Proloprim) in the last 36 hours . Verapamil (Calan, Verelan) in the last 36 hours or a  sustained release dose in the last 72 hours . Megestrol (Megace) in the last 5 days  . Cimetidine (Tagamet) in the last 6 hours . Ketoconazole (Nizoral) in the last 24 hours . Itraconazole (Sporanox) in the last 48 hours  . Prochlorperazine (Compazine) in the last 36 hours     []   [x]   Patient is known to have a history of torsades de pointes; congenital or acquired long QT syndromes.    []   [x]   Patient has received a Class 1 antiarrhythmic with less than 2 half-lives since last dose. (Disopyramide, Quinidine, Procainamide, Lidocaine, Mexiletine, Flecainide, Propafenone)    []   [x]   Patient has received amiodarone therapy in the past 3 months or amiodarone level is greater than 0.3 ng/ml.    Patient has been appropriately anticoagulated with Xarelto.  Labs:    Component Value Date/Time   K 4.3 01/04/2019 1024   MG 2.3 01/04/2019 1024     Plan: Potassium: K >/= 4: Appropriate to initiate Tikosyn, no replacement needed    Magnesium: Mg >2: Appropriate to initiate Tikosyn, no replacement needed     Thank you for allowing pharmacy to participate in this patient's care   Antonietta Jewel, PharmD, [place] Pharmacist  Phone: [phone]  Please check AMION for all [place] phone numbers After 10:00 PM, call [place] [phone] 01/04/2019  3:56 PM

## 2019-01-04 NOTE — TOC Benefit Eligibility Note (Signed)
Transition of Care [facility]) Benefit Eligibility Note    Patient Details  Name: Jose Warner MRN: [id number] Date of Birth: 1951/01/27   Medication/Dose: DOFETILIDE  125 MCG BID  , 250 MCG BID   500 MCG BID CAPSULE     Tier: (TIER- 1 DRUG)  Prescription Coverage Preferred Pharmacy: [place] with Person/Company/Phone Number:: AMY  @ [facility] # [phone]  Co-Pay: [id number] MCG BID - $ 48.51    250 MCG BID - $35.96    500 MCG BID -$45.26  Prior Approval: No     Additional Notes: TIKOSYN  125 MCG , 250 MCG  500 MCG BID CAPSULE _ NOT [place] # [phone]    Memory Argue Phone Number: 01/04/2019, 3:17 PM

## 2019-01-05 LAB — BASIC METABOLIC PANEL
Anion gap: 8 (ref 5–15)
BUN: 14 mg/dL (ref 8–23)
CO2: 22 mmol/L (ref 22–32)
Calcium: 8.3 mg/dL — ABNORMAL LOW (ref 8.9–10.3)
Chloride: 108 mmol/L (ref 98–111)
Creatinine, Ser: 1.11 mg/dL (ref 0.61–1.24)
GFR calc Af Amer: >60 mL/min (ref >60)
GFR calc non Af Amer: >60 mL/min (ref >60)
Glucose, Bld: 96 mg/dL (ref 70–99)
Potassium: 4.3 mmol/L (ref 3.5–5.1)
Sodium: 138 mmol/L (ref 135–145)

## 2019-01-05 LAB — MAGNESIUM: Magnesium: 2.2 mg/dL (ref 1.7–2.4)

## 2019-01-05 LAB — HIV ANTIBODY (ROUTINE TESTING W REFLEX): HIV Screen 4th Generation wRfx: NONREACTIVE

## 2019-01-05 MED ORDER — INFLUENZA VAC A&B SA ADJ QUAD 0.5 ML IM PRSY
0.5000 mL | PREFILLED_SYRINGE | INTRAMUSCULAR | Status: AC
  Administered 2019-01-07: 0.5 mL via INTRAMUSCULAR
  Filled 2019-01-05: qty 0.5

## 2019-01-05 NOTE — Progress Notes (Addendum)
Electrophysiology Rounding Note  Patient Name: Jose Warner Date of Encounter: 01/05/2019  Primary Cardiologist: Shelva Majestic, MD  Electrophysiologist: None    Subjective   Pt remains in afib on Tikosyn 500 mcg BID   QTc from EKG last pm shows stable QTc at 450  The patient is doing well today.  At this time, the patient denies chest pain, shortness of breath, or any new concerns.  Inpatient Medications    Scheduled Meds: . amLODipine  2.5 mg Oral QPM  . aspirin EC  81 mg Oral Daily  . bisoprolol  5 mg Oral Daily   And  . bisoprolol  2.5 mg Oral q1800  . cholecalciferol  2,000 Units Oral QHS  . dofetilide  500 mcg Oral BID  . ezetimibe  10 mg Oral QPM  . fluticasone furoate-vilanterol  1 puff Inhalation Daily  . multivitamin with minerals  1 tablet Oral Q supper  . niacin  1,000 mg Oral QHS  . pantoprazole  40 mg Oral BID  . pravastatin  80 mg Oral QPM  . rivaroxaban  20 mg Oral Q supper  . sodium chloride flush  3 mL Intravenous Q12H  . vitamin C  500 mg Oral Daily   Continuous Infusions: . sodium chloride     PRN Meds: sodium chloride, nitroGLYCERIN, sodium chloride flush   Vital Signs    Vitals:   01/04/19 1420 01/04/19 1741 01/04/19 2016 01/05/19 0654  BP: 122/87 108/70 96/62 94/69   Pulse: 87  84 86  Resp:   20 20  Temp: (!) 97.5 F (36.4 C)  97.9 F (36.6 C) 98.4 F (36.9 C)  TempSrc: Oral  Oral Oral  SpO2: 100%  97% 96%  Weight: 80.4 kg   80.2 kg  Height: 5' 8"  (1.727 m)       Intake/Output Summary (Last 24 hours) at 01/05/2019 0718 Last data filed at 01/04/2019 2000 Gross per 24 hour  Intake 240 ml  Output -  Net 240 ml   Filed Weights   01/04/19 1420 01/05/19 0654  Weight: 80.4 kg 80.2 kg    Physical Exam    GEN- The patient is well appearing, alert and oriented x 3 today.   Head- normocephalic, atraumatic Eyes-  Sclera clear, conjunctiva pink Ears- hearing intact Oropharynx- clear Neck- supple Lungs- Clear to ausculation  bilaterally, normal work of breathing Heart- Regular rate and rhythm, no murmurs, rubs or gallops GI- soft, NT, ND, + BS Extremities- no clubbing, cyanosis, or edema Skin- no rash or lesion Psych- euthymic mood, full affect Neuro- strength and sensation are intact  Labs    CBC No results for input(s): WBC, NEUTROABS, HGB, HCT, MCV, PLT in the last 72 hours. Basic Metabolic Panel Recent Labs    01/04/19 1024 01/05/19 0339  NA 139 138  K 4.3 4.3  CL 107 108  CO2 21* 22  GLUCOSE 96 96  BUN 17 14  CREATININE 1.16 1.11  CALCIUM 8.5* 8.3*  MG 2.3 2.2    Potassium  Date/Time Value Ref Range Status  01/05/2019 03:39 AM 4.3 3.5 - 5.1 mmol/L Final   Magnesium  Date/Time Value Ref Range Status  01/05/2019 03:39 AM 2.2 1.7 - 2.4 mg/dL Final    Comment:    Performed at [facility], [place] [address] [place], [place] [address]    Telemetry    AF 80-100s (personally reviewed)  Radiology    No results found.   Patient Profile  Jose Warner is a 68 y.o. male with a past medical history significant for persistent atrial fibrillation.  They were admitted for tikosyn load.   Assessment & Plan    1. Persistent atrial fibrillation Pt remains in afib on Tikosyn 500 mcg BID  Continue Xarelto Electrolytes stable.  CHA2DS2VASC is 3 Questions answered.  If pt does not convert chemically, plan on DCCV tomorrow    For questions or updates, please contact [place] Please consult [email or web address] for contact info under Cardiology/STEMI.  Signed, Shirley Friar, PA-C    I have seen, examined the patient, and reviewed the above assessment and plan.  Changes to above are made where necessary.  On exami RRR.  QT is stable on tikosyn.    Co Sign: Thompson Grayer, MD 01/05/2019 11:41 AM

## 2019-01-05 NOTE — Progress Notes (Signed)
  Reviewed am Tikosyn EKG  Pt converted to NSR/Sinus brady at 55 bpm. Stable QTc at 434 ms on Tikosyn 500 mcg BID.   Continue current dose. Will cancel DCCV.   Shirley Friar, PA-C  Pager: [phone]  01/05/2019 10:28 AM

## 2019-01-05 NOTE — Progress Notes (Signed)
Pharmacy: Dofetilide (Tikosyn) - Follow Up Assessment and Electrolyte Replacement  Pharmacy consulted to assist in monitoring and replacing electrolytes in this 68 y.o. male admitted on 01/04/2019 undergoing dofetilide initiation. First dofetilide dose: 9/15@2018 .  Labs:    Component Value Date/Time   K 4.3 01/05/2019 0339   MG 2.2 01/05/2019 [id number]     Plan: Potassium: K >/= 4: No additional supplementation needed  Magnesium: Mg > 2: No additional supplementation needed  Thank you for allowing pharmacy to participate in this patient's care   Antonietta Jewel, PharmD, [place] Pharmacist  Phone: [phone]  Please check AMION for all [place] phone numbers After 10:00 PM, call [place] [phone] 01/05/2019  7:11 AM

## 2019-01-06 ENCOUNTER — Encounter (HOSPITAL_COMMUNITY)
Admission: RE | Disposition: A | Discharge status: Home / Self Care | Payer: Self-pay | Source: Ambulatory Visit | Attending: Internal Medicine

## 2019-01-06 LAB — BASIC METABOLIC PANEL
Anion gap: 8 (ref 5–15)
BUN: 16 mg/dL (ref 8–23)
CO2: 24 mmol/L (ref 22–32)
Calcium: 8.2 mg/dL — ABNORMAL LOW (ref 8.9–10.3)
Chloride: 106 mmol/L (ref 98–111)
Creatinine, Ser: 1.1 mg/dL (ref 0.61–1.24)
GFR calc Af Amer: >60 mL/min (ref >60)
GFR calc non Af Amer: >60 mL/min (ref >60)
Glucose, Bld: 103 mg/dL — ABNORMAL HIGH (ref 70–99)
Potassium: 4.2 mmol/L (ref 3.5–5.1)
Sodium: 138 mmol/L (ref 135–145)

## 2019-01-06 LAB — MAGNESIUM: Magnesium: 2.3 mg/dL (ref 1.7–2.4)

## 2019-01-06 SURGERY — CARDIOVERSION
Anesthesia: General

## 2019-01-06 NOTE — Care Management (Signed)
01-06-19 1518 Tikosyn Load! Patient is aware of cost. Patient will need a Rx for 7 day supply no refills and 30 day supply no refills sent to [facility]. Pt also wants 90 day supply with refills sent to [facility] Bethena Roys, RN,BSN Case Manager [phone]

## 2019-01-06 NOTE — Progress Notes (Signed)
Morning EKG reviewed  Shows Sinus brady at 52 bpm with QTc stable ~450 ms when corrected for rate.  Continue Tikosyn 500 mcg BID.   Shirley Friar, PA-C  Pager: [phone]  01/06/2019 12:02 PM

## 2019-01-06 NOTE — Progress Notes (Signed)
Pharmacy: Dofetilide (Tikosyn) - Follow Up Assessment and Electrolyte Replacement  Pharmacy consulted to assist in monitoring and replacing electrolytes in this 68 y.o. male admitted on 01/04/2019 undergoing dofetilide initiation. First dofetilide dose: 9/15@2018 .  Labs:    Component Value Date/Time   K 4.2 01/06/2019 0418   MG 2.3 01/06/2019 0418     Plan: Potassium: K >/= 4: No additional supplementation needed  Magnesium: Mg > 2: No additional supplementation needed   Thank you for allowing pharmacy to participate in this patient's care   Antonietta Jewel, PharmD, [place] Pharmacist  Phone: [phone]  Please check AMION for all [place] phone numbers After 10:00 PM, call [place] [phone] 01/06/2019  7:11 AM

## 2019-01-06 NOTE — Progress Notes (Addendum)
Electrophysiology Rounding Note  Patient Name: Jose Warner Date of Encounter: 01/06/2019  Primary Cardiologist: Shelva Majestic, MD  Electrophysiologist: None    Subjective   Pt converted to sinus rhythm on Tikosyn 500 mcg BID   QTc from EKG last pm shows stable QTc at 473 ms in NSR  The patient is doing well today.  At this time, the patient denies chest pain, shortness of breath, or any new concerns. He slept better last night.  Inpatient Medications    Scheduled Meds: . amLODipine  2.5 mg Oral QPM  . aspirin EC  81 mg Oral Daily  . bisoprolol  5 mg Oral Daily   And  . bisoprolol  2.5 mg Oral q1800  . cholecalciferol  2,000 Units Oral QHS  . dofetilide  500 mcg Oral BID  . ezetimibe  10 mg Oral QPM  . fluticasone furoate-vilanterol  1 puff Inhalation Daily  . influenza vaccine adjuvanted  0.5 mL Intramuscular Tomorrow-1000  . multivitamin with minerals  1 tablet Oral Q supper  . niacin  1,000 mg Oral QHS  . pantoprazole  40 mg Oral BID  . pravastatin  80 mg Oral QPM  . rivaroxaban  20 mg Oral Q supper  . sodium chloride flush  3 mL Intravenous Q12H  . vitamin C  500 mg Oral Daily   Continuous Infusions: . sodium chloride     PRN Meds: sodium chloride, nitroGLYCERIN, sodium chloride flush   Vital Signs    Vitals:   01/05/19 1440 01/05/19 1720 01/05/19 2058 01/06/19 0644  BP: (!) 86/60 99/63 102/67 (!) 93/58  Pulse: (!) 58  65 (!) 59  Resp:   20 20  Temp: 97.7 F (36.5 C)  98.5 F (36.9 C) 98 F (36.7 C)  TempSrc: Oral  Oral Oral  SpO2: 100%  98% 100%  Weight:    80.4 kg  Height:        Intake/Output Summary (Last 24 hours) at 01/06/2019 0732 Last data filed at 01/05/2019 2030 Gross per 24 hour  Intake 600 ml  Output -  Net 600 ml   Filed Weights   01/04/19 1420 01/05/19 0654 01/06/19 0644  Weight: 80.4 kg 80.2 kg 80.4 kg    Physical Exam    GEN- The patient is well appearing, alert and oriented x 3 today.   Head- normocephalic,  atraumatic Eyes-  Sclera clear, conjunctiva pink Ears- hearing intact Oropharynx- clear Neck- supple Lungs- Clear to ausculation bilaterally, normal work of breathing Heart- Regular rate and rhythm, somewhat brady, no murmurs, rubs or gallops GI- soft, NT, ND, + BS Extremities- no clubbing, cyanosis, or edema Skin- no rash or lesion Psych- euthymic mood, full affect Neuro- strength and sensation are intact  Labs    CBC No results for input(s): WBC, NEUTROABS, HGB, HCT, MCV, PLT in the last 72 hours. Basic Metabolic Panel Recent Labs    01/05/19 0339 01/06/19 0418  NA 138 138  K 4.3 4.2  CL 108 106  CO2 22 24  GLUCOSE 96 103*  BUN 14 16  CREATININE 1.11 1.10  CALCIUM 8.3* 8.2*  MG 2.2 2.3    Potassium  Date/Time Value Ref Range Status  01/06/2019 04:18 AM 4.2 3.5 - 5.1 mmol/L Final   Magnesium  Date/Time Value Ref Range Status  01/06/2019 04:18 AM 2.3 1.7 - 2.4 mg/dL Final    Comment:    Performed at [facility], [place] [address], [place], [place] [address]  Telemetry    Sinus brady - NSR 50-70s (personally reviewed)  Radiology    No results found.   Patient Profile     Jose Warner is a 68 y.o. male with a past medical history significant for persistent atrial fibrillation.  They were admitted for tikosyn load.   Assessment & Plan    1. Persistent atrial fibrillation Pt converted to sinus rhythm on Tikosyn 500 mcg BID  Continue Xarelto Electrolytes stable.  CHA2DS2VASC is 3 Questions answered.    For questions or updates, please contact [place] Please consult [email or web address] for contact info under Cardiology/STEMI.  Signed, Shirley Friar, PA-C  01/06/2019, 7:32 AM     I have seen, examined the patient, and reviewed the above assessment and plan.  Changes to above are made where necessary.  On exam, RRR.  Converted to sinus rhythm yesterday.  Qt remains stable. Continue tikosyn.  Last echo 2019.  We will update echo  now that he is in sinus rhythm prior to discharge.  Co Sign: Thompson Grayer, MD 01/06/2019 9:34 PM

## 2019-01-07 ENCOUNTER — Inpatient Hospital Stay (HOSPITAL_COMMUNITY): Payer: Medicare Other

## 2019-01-07 DIAGNOSIS — I361 Nonrheumatic tricuspid (valve) insufficiency: Secondary | ICD-10-CM

## 2019-01-07 DIAGNOSIS — I340 Nonrheumatic mitral (valve) insufficiency: Secondary | ICD-10-CM

## 2019-01-07 LAB — BASIC METABOLIC PANEL
Anion gap: 7 (ref 5–15)
BUN: 12 mg/dL (ref 8–23)
CO2: 23 mmol/L (ref 22–32)
Calcium: 8.2 mg/dL — ABNORMAL LOW (ref 8.9–10.3)
Chloride: 108 mmol/L (ref 98–111)
Creatinine, Ser: 1.1 mg/dL (ref 0.61–1.24)
GFR calc Af Amer: >60 mL/min (ref >60)
GFR calc non Af Amer: >60 mL/min (ref >60)
Glucose, Bld: 113 mg/dL — ABNORMAL HIGH (ref 70–99)
Potassium: 3.9 mmol/L (ref 3.5–5.1)
Sodium: 138 mmol/L (ref 135–145)

## 2019-01-07 LAB — ECHOCARDIOGRAM COMPLETE
Height: 68 in
Weight: 2814.4 oz

## 2019-01-07 LAB — MAGNESIUM: Magnesium: 2.2 mg/dL (ref 1.7–2.4)

## 2019-01-07 MED ORDER — DOFETILIDE 500 MCG PO CAPS: 500.0000 ug | ORAL_CAPSULE | Freq: Two times a day (BID) | ORAL | 0 refills | Status: DC

## 2019-01-07 MED ORDER — POTASSIUM CHLORIDE CRYS ER 20 MEQ PO TBCR
20.0000 meq | EXTENDED_RELEASE_TABLET | Freq: Once | ORAL | Status: AC
  Administered 2019-01-07: 20 meq via ORAL
  Filled 2019-01-07: qty 1

## 2019-01-07 MED FILL — DOFETILIDE 500 MCG CAPS: 500 | 30 days supply | Qty: 60 | Fill #0

## 2019-01-07 MED FILL — TIKOSYN 500 MCG CAPS: 500 | 7 days supply | Qty: 14 | Fill #0

## 2019-01-07 NOTE — Progress Notes (Addendum)
Last evenings EKG reviewed. K 3.9 (Will supp), Mg 2.2.  Shows NSR at 64 bpm with QTc stable ~450-460 ms.  Continue Tikosyn 500 mcg BID for final hospital dose. Home today if QTc remains stable.   Full note pending disposition.   Shirley Friar, PA-C  Pager: [phone]  01/07/2019 7:02 AM

## 2019-01-07 NOTE — Progress Notes (Signed)
  Echocardiogram 2D Echocardiogram has been performed.  Darlina Sicilian M 01/07/2019, 1:14 PM

## 2019-01-07 NOTE — Discharge Summary (Addendum)
ELECTROPHYSIOLOGY PROCEDURE DISCHARGE SUMMARY    Patient ID: Jose Warner,  MRN: [id number], DOB/AGE: [birth year] 68 y.o.  Admit date: 01/04/2019 Discharge date: 01/07/2019  Primary Care Physician: Derinda Late, MD  Primary Cardiologist: Shelva Majestic, MD  Electrophysiologist: Dr. Rayann Heman  Primary Discharge Diagnosis:  1.  Persistent atrial fibrillation status post Tikosyn loading this admission  Secondary Discharge Diagnosis:  2.  Non-obstructive CAD   Allergies  Allergen Reactions  . Gluten Meal Other (See Comments)    Celiac, stomach crams , gas  . Wheat Bran Other (See Comments)    Celiac   . Statins     Leg muscle pains - rosuvastatin, atorvastatin, zetia. Tolerates simvastatin  . Sulfa Antibiotics Rash     Procedures This Admission:  1.  Tikosyn loading  Brief HPI: Jose Warner is a 68 y.o. male with a past medical history as noted above.  They were referred to EP in the outpatient setting for treatment options of atrial fibrillation.  Risks, benefits, and alternatives to Tikosyn were reviewed with the patient who wished to proceed.    Hospital Course:  The patient was admitted and Tikosyn was initiated.  Renal function and electrolytes were followed during the hospitalization.  Their QTc remained stable.  Pt converted chemically on tikosyn, and thus direct current cardioversion was not required.   They were monitored until discharge on telemetry which demonstrated Sinus brady and normal sinus in the 50-60s. Echo was updated day of discharge from 01/2018. Echo showed LVEF 55-60%, Mod LAE (4.7 cm). On the day of discharge, they were examined by Dr. Rayann Heman  who considered them stable for discharge to home.  Follow-up has been arranged with the Atrial Fibrillation clinic in approximately 1 week and with Dr. Rayann Heman in 4 weeks.   Physical Exam: Vitals:   01/06/19 0644 01/06/19 1621 01/06/19 2057 01/07/19 0438  BP: (!) 93/58 113/65 108/75 108/73  Pulse: (!)  59 (!) 59 62 (!) 58  Resp: 20 20    Temp: 98 F (36.7 C) 97.9 F (36.6 C) 97.7 F (36.5 C) 97.8 F (36.6 C)  TempSrc: Oral Oral Oral   SpO2: 100% 100% 99% 96%  Weight: 80.4 kg   79.8 kg  Height:        GEN- The patient is well appearing, alert and oriented x 3 today.   HEENT: normocephalic, atraumatic; sclera clear, conjunctiva pink; hearing intact; oropharynx clear; neck supple, no JVP Lymph- no cervical lymphadenopathy Lungs- Clear to ausculation bilaterally, normal work of breathing.  No wheezes, rales, rhonchi Heart- Regular rate and rhythm, no murmurs, rubs or gallops, PMI not laterally displaced GI- soft, non-tender, non-distended, bowel sounds present, no hepatosplenomegaly Extremities- no clubbing, cyanosis, or edema; DP/PT/radial pulses 2+ bilaterally MS- no significant deformity or atrophy Skin- warm and dry, no rash or lesion Psych- euthymic mood, full affect Neuro- strength and sensation are intact   Labs:   Lab Results  Component Value Date   WBC 5.7 11/01/2018   HGB 15.9 11/01/2018   HCT 49.2 11/01/2018   MCV 90.8 11/01/2018   PLT 236 11/01/2018    Recent Labs  Lab 01/07/19 0347  NA 138  K 3.9  CL 108  CO2 23  BUN 12  CREATININE 1.10  CALCIUM 8.2*  GLUCOSE 113*     Discharge Medications:  Allergies as of 01/07/2019      Reactions   Gluten Meal Other (See Comments)   Celiac, stomach crams , gas   Wheat  Bran Other (See Comments)   Celiac   Statins    Leg muscle pains - rosuvastatin, atorvastatin, zetia. Tolerates simvastatin   Sulfa Antibiotics Rash      Medication List    TAKE these medications   Advair Diskus 250-50 MCG/DOSE Aepb Generic drug: Fluticasone-Salmeterol Inhale 1 puff into the lungs daily as needed (For shortness of Breath).   amLODipine 2.5 MG tablet Commonly known as: NORVASC Take 1 tablet (2.5 mg total) by mouth daily. What changed: when to take this   ascorbic acid 500 MG tablet Commonly known as: VITAMIN C Take  500 mg by mouth daily.   aspirin EC 81 MG tablet Take 1 tablet (81 mg total) by mouth daily.   b complex vitamins tablet Take 1 tablet by mouth daily with supper.   bisoprolol 5 MG tablet Commonly known as: ZEBETA Take 2.5-5 mg by mouth See admin instructions. Take 92m in the morning, then take 2.573min the evening per patient   dofetilide 500 MCG capsule Commonly known as: TIKOSYN Take 1 capsule (500 mcg total) by mouth 2 (two) times daily.   dofetilide 500 MCG capsule Commonly known as: Tikosyn Take 1 capsule (500 mcg total) by mouth 2 (two) times daily.   ezetimibe 10 MG tablet Commonly known as: ZETIA Take 10 mg by mouth every evening.   multivitamin with minerals Tabs tablet Take 1 tablet by mouth daily with supper.   niacin 1000 MG CR tablet Commonly known as: NIASPAN Take 1,000 mg by mouth at bedtime.   nitroGLYCERIN 0.4 MG SL tablet Commonly known as: NITROSTAT Place 1 tablet (0.4 mg total) under the tongue every 5 (five) minutes x 3 doses as needed for chest pain.   pantoprazole 40 MG tablet Commonly known as: PROTONIX Take 40 mg by mouth 2 (two) times daily.   pravastatin 80 MG tablet Commonly known as: PRAVACHOL Take 80 mg by mouth every evening.   ProAir HFA 108 (90 Base) MCG/ACT inhaler Generic drug: albuterol Take 2 puffs by mouth 2 (two) times daily as needed. For shortness of breath.   Vitamin D (Ergocalciferol) 50 MCG (2000 UT) Caps Take 2,000 Units by mouth at bedtime.   Xarelto 20 MG Tabs tablet Generic drug: rivaroxaban TAKE 1 TABLET DAILY WITH SUPPER What changed: See the new instructions.       Disposition:   Follow-up Information    AlThompson GrayerMD Follow up on 02/14/2019.   Specialty: Cardiology Why: at 1015 for 1 month tikosyn follow up Contact information: [phone]      [facility] up on 01/14/2019.   Specialty: Cardiology Why: at  0830 for 1 week tikosyn follow up Contact information: [phone]        Duration of Discharge Encounter: Greater than 30 minutes including physician time.  SiJacalyn LefevrePA-C  01/07/2019 2:33 PM

## 2019-01-07 NOTE — Care Management Important Message (Signed)
Important Message  Patient Details  Name: Jose Warner MRN: [id number] Date of Birth: 08-08-1950   Medicare Important Message Given:  Yes     Shelda Altes 01/07/2019, 1:27 PM

## 2019-01-07 NOTE — Discharge Instructions (Signed)

## 2019-01-13 ENCOUNTER — Ambulatory Visit (HOSPITAL_COMMUNITY)
Admission: RE | Admit: 2019-01-13 | Discharge: 2019-01-13 | Disposition: A | Discharge status: Home / Self Care | Payer: Medicare Other | Source: Ambulatory Visit | Attending: Physician Assistant | Admitting: Physician Assistant

## 2019-01-13 ENCOUNTER — Encounter (HOSPITAL_COMMUNITY): Payer: Self-pay | Admitting: Physician Assistant

## 2019-01-13 ENCOUNTER — Other Ambulatory Visit: Payer: Self-pay

## 2019-01-13 VITALS — BP 122/60 | HR 56 | Ht 68.0 in | Wt 179.0 lb

## 2019-01-13 DIAGNOSIS — I2510 Atherosclerotic heart disease of native coronary artery without angina pectoris: Secondary | ICD-10-CM | POA: Insufficient documentation

## 2019-01-13 DIAGNOSIS — I4819 Other persistent atrial fibrillation: Secondary | ICD-10-CM | POA: Diagnosis present

## 2019-01-13 DIAGNOSIS — D509 Iron deficiency anemia, unspecified: Secondary | ICD-10-CM | POA: Diagnosis not present

## 2019-01-13 DIAGNOSIS — Z7901 Long term (current) use of anticoagulants: Secondary | ICD-10-CM | POA: Insufficient documentation

## 2019-01-13 DIAGNOSIS — E785 Hyperlipidemia, unspecified: Secondary | ICD-10-CM | POA: Insufficient documentation

## 2019-01-13 DIAGNOSIS — K219 Gastro-esophageal reflux disease without esophagitis: Secondary | ICD-10-CM | POA: Insufficient documentation

## 2019-01-13 DIAGNOSIS — Z8249 Family history of ischemic heart disease and other diseases of the circulatory system: Secondary | ICD-10-CM | POA: Insufficient documentation

## 2019-01-13 DIAGNOSIS — K900 Celiac disease: Secondary | ICD-10-CM | POA: Diagnosis not present

## 2019-01-13 DIAGNOSIS — Z7982 Long term (current) use of aspirin: Secondary | ICD-10-CM | POA: Insufficient documentation

## 2019-01-13 DIAGNOSIS — Z79899 Other long term (current) drug therapy: Secondary | ICD-10-CM | POA: Diagnosis not present

## 2019-01-13 LAB — BASIC METABOLIC PANEL
Anion gap: 9 (ref 5–15)
BUN: 11 mg/dL (ref 8–23)
CO2: 21 mmol/L — ABNORMAL LOW (ref 22–32)
Calcium: 8.5 mg/dL — ABNORMAL LOW (ref 8.9–10.3)
Chloride: 108 mmol/L (ref 98–111)
Creatinine, Ser: 1.07 mg/dL (ref 0.61–1.24)
GFR calc Af Amer: >60 mL/min (ref >60)
GFR calc non Af Amer: >60 mL/min (ref >60)
Glucose, Bld: 122 mg/dL — ABNORMAL HIGH (ref 70–99)
Potassium: 4.3 mmol/L (ref 3.5–5.1)
Sodium: 138 mmol/L (ref 135–145)

## 2019-01-13 LAB — MAGNESIUM: Magnesium: 2.2 mg/dL (ref 1.7–2.4)

## 2019-01-13 MED ORDER — DOFETILIDE 500 MCG PO CAPS: 500.0000 ug | ORAL_CAPSULE | Freq: Two times a day (BID) | ORAL | 2 refills | Status: DC

## 2019-01-13 NOTE — Progress Notes (Signed)
Primary Care Physician: Derinda Late, MD Referring Physician: [facility] F/u Primary Cardiologist: [name] Primary EP: Dr Becky Sax Jose Warner is a 68 y.o. male with a h/o persistent afib, HLD, CAD, and prostate cancer who presents to the AF Clinic for follow up. Patient loaded on dofetilide 9/15-9/18/20. He converted to SR on the medication and did not require DCCV. Patient reports that he has done very well since discharge with no symptoms of afib. He is tolerating the medication well.   Today, he denies symptoms of palpitations, chest pain, SOB, orthopnea, PND, lower extremity edema, dizziness, presyncope, syncope, or neurologic sequela. The patient is tolerating medications without difficulties and is otherwise without complaint today.   Past Medical History:  Diagnosis Date  . Anemia   . Bronchitis 05-10-12   past fall- 4 runs antibiotics due to bronchitis-uses [employer] as needed  . Celiac disease   . Colon polyps    adenomatous  . Coronary artery disease   . Diverticulosis   . GERD (gastroesophageal reflux disease)   . Hepatitis 05-10-12   "was told non A, non B"-unclean dental equip.  Marland Kitchen Hyperlipidemia   . Iron deficiency anemia   . Pericarditis   . Persistent atrial fibrillation 06/05/2017  . Prostate cancer ([place]) 05-10-12   ;bx. 04-12-12, dx  . Spasm of esophagus 2013   Past Surgical History:  Procedure Laterality Date  . CARDIAC CATHETERIZATION  09/13/2004   LAD:30%-40% in prox. to mid segment with 20% in the mid segment and 20% in left Circ., mild 20% right common iliac narrowing. medical therapy  . CARDIOVERSION  06/05/2017  . CARDIOVERSION N/A 11/11/2018   Procedure: CARDIOVERSION;  Surgeon: Pixie Casino, MD;  Location: [facility] ENDOSCOPY;  Service: Cardiovascular;  Laterality: N/A;  . CORONARY STENT INTERVENTION N/A 06/11/2017   Procedure: CORONARY STENT INTERVENTION;  Surgeon: Belva Crome, MD;  Location: [place] CV LAB;  Service:  Cardiovascular;  Laterality: N/A;  . HERNIA REPAIR  5 yrs ago   right   . LEFT HEART CATH AND CORONARY ANGIOGRAPHY N/A 06/11/2017   Procedure: LEFT HEART CATH AND CORONARY ANGIOGRAPHY;  Surgeon: Belva Crome, MD;  Location: [place] CV LAB;  Service: Cardiovascular;  Laterality: N/A;  . LEFT HEART CATH AND CORONARY ANGIOGRAPHY N/A 08/18/2017   Procedure: LEFT HEART CATH AND CORONARY ANGIOGRAPHY;  Surgeon: Martinique, Peter M, MD;  Location: [place] CV LAB;  Service: Cardiovascular;  Laterality: N/A;  . NM MYOCAR PERF WALL MOTION  11/30/2007   protocol:Bruce, post EF 67%,mild perfusion defect seen in basal inferior consistant with Attenuation artifact, exercise cap. 12METS  . ROBOT ASSISTED LAPAROSCOPIC RADICAL PROSTATECTOMY  05/20/2012   Procedure: ROBOTIC ASSISTED LAPAROSCOPIC RADICAL PROSTATECTOMY LEVEL 1;  Surgeon: Dutch Gray, MD;  Location: [facility] ORS;  Service: Urology;  Laterality: N/A;     . TRANSURETHRAL RESECTION OF PROSTATE  35yrs ago    Current Outpatient Medications  Medication Sig Dispense Refill  . ADVAIR DISKUS 250-50 MCG/DOSE AEPB Inhale 1 puff into the lungs daily as needed (For shortness of Breath).     Marland Kitchen amLODipine (NORVASC) 2.5 MG tablet Take 1 tablet (2.5 mg total) by mouth daily. (Patient taking differently: Take 2.5 mg by mouth every evening. ) 90 tablet 3  . ascorbic acid (VITAMIN C) 500 MG tablet Take 500 mg by mouth daily.     Marland Kitchen aspirin EC 81 MG tablet Take 1 tablet (81 mg total) by mouth daily. 90 tablet 3  .  b complex vitamins tablet Take 1 tablet by mouth daily with supper.    . bisoprolol (ZEBETA) 5 MG tablet Take 2.5-5 mg by mouth See admin instructions. Take 5mg  in the morning, then take 2.5mg  in the evening per patient    . dofetilide (TIKOSYN) 500 MCG capsule Take 1 capsule (500 mcg total) by mouth 2 (two) times daily. 14 capsule 0  . dofetilide (TIKOSYN) 500 MCG capsule Take 1 capsule (500 mcg total) by mouth 2 (two) times daily. 60 capsule 0  . ezetimibe  (ZETIA) 10 MG tablet Take 10 mg by mouth every evening.     . Multiple Vitamin (MULTIVITAMIN WITH MINERALS) TABS tablet Take 1 tablet by mouth daily with supper.    . niacin (NIASPAN) 1000 MG CR tablet Take 1,000 mg by mouth at bedtime.     . nitroGLYCERIN (NITROSTAT) 0.4 MG SL tablet Place 1 tablet (0.4 mg total) under the tongue every 5 (five) minutes x 3 doses as needed for chest pain. 30 tablet 0  . pantoprazole (PROTONIX) 40 MG tablet Take 40 mg by mouth 2 (two) times daily.     . pravastatin (PRAVACHOL) 80 MG tablet Take 80 mg by mouth every evening.     Marland Kitchen PROAIR HFA 108 (90 BASE) MCG/ACT inhaler Take 2 puffs by mouth 2 (two) times daily as needed. For shortness of breath.    . Vitamin D, Ergocalciferol, 2000 units CAPS Take 2,000 Units by mouth at bedtime.     Alveda Reasons 20 MG TABS tablet TAKE 1 TABLET DAILY WITH SUPPER (Patient taking differently: Take 20 mg by mouth daily with supper. ) 90 tablet 1   Current Facility-Administered Medications  Medication Dose Route Frequency Provider Last Rate Last Dose  . 0.9 %  sodium chloride infusion  500 mL Intravenous Once Irene Shipper, MD        Allergies  Allergen Reactions  . Gluten Meal Other (See Comments)    Celiac, stomach crams , gas  . Wheat Bran Other (See Comments)    Celiac   . Statins     Leg muscle pains - rosuvastatin, atorvastatin, zetia. Tolerates simvastatin  . Sulfa Antibiotics Rash    Social History   Socioeconomic History  . Marital status: Married    Spouse name: Not on file  . Number of children: 3  . Years of education: Not on file  . Highest education level: Not on file  Occupational History    Employer: [employer]  . Financial resource strain: Not on file  . Food insecurity    Worry: Patient refused    Inability: Patient refused  . Transportation needs    Medical: Patient refused    Non-medical: Patient refused  Tobacco Use  . Smoking status: Never Smoker  . Smokeless tobacco: Never  Used  Substance and Sexual Activity  . Alcohol use: Never    Alcohol/week: 2.0 standard drinks    Types: 2 Glasses of wine per week    Frequency: Never  . Drug use: No  . Sexual activity: Yes    Partners: Female  Lifestyle  . Physical activity    Days per week: Not on file    Minutes per session: Not on file  . Stress: Not on file  Relationships  . Social Herbalist on phone: Patient refused    Gets together: Patient refused    Attends religious service: Patient refused    Active member of club or organization:  Patient refused    Attends meetings of clubs or organizations: Patient refused    Relationship status: Patient refused  . Intimate partner violence    Fear of current or ex partner: Patient refused    Emotionally abused: Patient refused    Physically abused: Patient refused    Forced sexual activity: Patient refused  Other Topics Concern  . Not on file  Social History Narrative  . Not on file    Family History  Problem Relation Age of Onset  . Stroke Mother   . Heart disease Father   . Heart attack Father   . Cancer Sister   . Stroke Maternal Grandfather   . Cancer Paternal Grandmother   . Heart attack Paternal Grandfather   . Heart disease Paternal Grandfather   . Colon cancer Neg Hx   . Esophageal cancer Neg Hx   . Pancreatic cancer Neg Hx   . Rectal cancer Neg Hx   . Stomach cancer Neg Hx     ROS- All systems are reviewed and negative except as per the HPI above  Physical Exam: There were no vitals filed for this visit. Wt Readings from Last 3 Encounters:  01/07/19 79.8 kg  01/04/19 80 kg  12/29/18 79.6 kg    Labs: Lab Results  Component Value Date   NA 138 01/07/2019   K 3.9 01/07/2019   CL 108 01/07/2019   CO2 23 01/07/2019   GLUCOSE 113 (H) 01/07/2019   BUN 12 01/07/2019   CREATININE 1.10 01/07/2019   CALCIUM 8.2 (L) 01/07/2019   PHOS 3.7 12/28/2017   MG 2.2 01/07/2019   Lab Results  Component Value Date   INR 1.18  06/11/2017   Lab Results  Component Value Date   CHOL 122 06/10/2017   HDL 41 06/10/2017   LDLCALC 60 06/10/2017   TRIG 103 06/10/2017    GEN- The patient is well appearing, alert and oriented x 3 today.   HEENT-head normocephalic, atraumatic, sclera clear, conjunctiva pink, hearing intact, trachea midline. Lungs- Clear to ausculation bilaterally, normal work of breathing Heart- Regular rate and rhythm, no murmurs, rubs or gallops  GI- soft, NT, ND, + BS Extremities- no clubbing, cyanosis, or edema MS- no significant deformity or atrophy Skin- no rash or lesion Psych- euthymic mood, full affect Neuro- strength and sensation are intact   EKG- SB HR 56, PR 164, QRS 84, QTc 445  Epic records reviewed  Cardiac CT- 9/10-IMPRESSION: 1. Normal left ventricular size, thickness and hyperdynamic systolic function (LVEF = 71%). There are no regional wall motion abnormalities. There is no late gadolinium enhancement in the left ventricular myocardium. 2. Normal right ventricular size, thickness and systolic function (LVEF = 73%). There are no regional wall motion abnormalities. 3. Mildly dilated left atrium. There is atypical LGE of the left and right atrial walls suspicious for fibrosis. 4. Normal size of the aortic root, ascending aorta and pulmonary artery. 5.  Mild aortic and mitral and trivial tricuspid regurgitation. 6. No pericardial effusion. There is mild late gadolinium enhancement adjacent to the inferolateral and anterior left ventricular walls and RV free wall.  Collectively, these findings are consistent with an acute pericarditis. LVEF has improved when compared to the prior echocardiogram on 06/10/2017 from 50-55% to 71%.  Echo 01/07/19 demonstrated  1. Left ventricular ejection fraction, by visual estimation, is 55 to 60%. The left ventricle has normal function. Normal left ventricular size. There is mildly increased left ventricular hypertrophy.  2. Elevated left  ventricular  end-diastolic pressure.  3. Left ventricular diastolic Doppler parameters are consistent with pseudonormalization pattern of LV diastolic filling.  4. Global right ventricle has normal systolic function.The right ventricular size is normal. No increase in right ventricular wall thickness.  5. Left atrial size was moderately dilated.  6. Right atrial size was normal.  7. The mitral valve is normal in structure. Mild mitral valve regurgitation. No evidence of mitral stenosis.  8. The tricuspid valve is normal in structure. Tricuspid valve regurgitation is mild.  9. The aortic valve is normal in structure. Aortic valve regurgitation is mild to moderate by color flow Doppler. Structurally normal aortic valve, with no evidence of sclerosis or stenosis. 10. The pulmonic valve was normal in structure. Pulmonic valve regurgitation is not visualized by color flow Doppler. 11. The inferior vena cava is normal in size with greater than 50% respiratory variability, suggesting right atrial pressure of 3 mmHg.   Assessment and Plan: 1. Persistent atrial fibrillation S/p successful DCCV 11/11/18 but unfortunately has had recurrent AF.  Patient recently loaded on dofetilide, appears to be maintaining SR. Continue dofetilide 500 mcg BID. QT stable. Bmet/mag today Continue Xarelto 20 mg daily Continue bisoprolol 5 mg AM and 2.5 mg PM  Patient may be interested in ablation if he fails dofetilide.   This patients CHA2DS2-VASc Score and unadjusted Ischemic Stroke Rate (% per year) is equal to 2.2 % stroke rate/year from a score of 2  Above score calculated as 1 point each if present [CHF, HTN, DM, Vascular=MI/PAD/Aortic Plaque, Age if 65-74, or Male] Above score calculated as 2 points each if present [Age > 75, or Stroke/TIA/TE]  2. CAD DES to LAD 06/11/17. No anginal symptoms. Continue present therapy and risk factor modification.    Follow up with Dr Rayann Heman as scheduled.    [facility] [address] [place], [place] [address] [phone]

## 2019-01-14 ENCOUNTER — Ambulatory Visit (HOSPITAL_COMMUNITY): Payer: Medicare Other | Admitting: Physician Assistant

## 2019-02-10 ENCOUNTER — Telehealth: Payer: Self-pay

## 2019-02-14 ENCOUNTER — Telehealth (INDEPENDENT_AMBULATORY_CARE_PROVIDER_SITE_OTHER): Payer: Medicare Other | Admitting: Internal Medicine

## 2019-02-14 VITALS — BP 131/74 | HR 58 | Ht 68.0 in | Wt 175.0 lb

## 2019-02-14 DIAGNOSIS — I10 Essential (primary) hypertension: Secondary | ICD-10-CM

## 2019-02-14 DIAGNOSIS — I4819 Other persistent atrial fibrillation: Secondary | ICD-10-CM

## 2019-02-14 DIAGNOSIS — I2510 Atherosclerotic heart disease of native coronary artery without angina pectoris: Secondary | ICD-10-CM

## 2019-02-14 NOTE — Progress Notes (Signed)
Electrophysiology TeleHealth Note   Due to national recommendations of social distancing due to COVID 19, an audio/video telehealth visit is felt to be most appropriate for this patient at this time.  See MyChart message from today for the patient's consent to telehealth for [facility].  Date:  02/14/2019   ID:  Jose Warner, DOB 1951-02-21, MRN [id number]  Location: patient's home  Provider location:  [facility]  Evaluation Performed: Follow-up visit  PCP:  Derinda Late, MD   Electrophysiologist:  Dr Rayann Heman  Chief Complaint:  palpitations  History of Present Illness:    Jose Warner is a 68 y.o. male who presents via telehealth conferencing today.  Since his admit for tikosyn load, the patient reports doing very well.  He feels much better and is pleased with results.  He does not think that he has had any afib.  Today, he denies symptoms of palpitations, chest pain, shortness of breath,  lower extremity edema, dizziness, presyncope, or syncope.  The patient is otherwise without complaint today.  The patient denies symptoms of fevers, chills, cough, or new SOB worrisome for COVID 19.  Past Medical History:  Diagnosis Date   Anemia    Bronchitis 05-10-12   past fall- 4 runs antibiotics due to bronchitis-uses Pro Air as needed   Celiac disease    Colon polyps    adenomatous   Coronary artery disease    Diverticulosis    GERD (gastroesophageal reflux disease)    Hepatitis 05-10-12   "was told non A, non B"-unclean dental equip.   Hyperlipidemia    Iron deficiency anemia    Pericarditis    Persistent atrial fibrillation 06/05/2017   Prostate cancer ([place]) 05-10-12   ;bx. 04-12-12, dx   Spasm of esophagus 2013    Past Surgical History:  Procedure Laterality Date   CARDIAC CATHETERIZATION  09/13/2004   LAD:30%-40% in prox. to mid segment with 20% in the mid segment and 20% in left Circ., mild 20% right common iliac narrowing. medical  therapy   CARDIOVERSION  06/05/2017   CARDIOVERSION N/A 11/11/2018   Procedure: CARDIOVERSION;  Surgeon: Pixie Casino, MD;  Location: [facility];  Service: Cardiovascular;  Laterality: N/A;   CORONARY STENT INTERVENTION N/A 06/11/2017   Procedure: CORONARY STENT INTERVENTION;  Surgeon: Belva Crome, MD;  Location: [facility];  Service: Cardiovascular;  Laterality: N/A;   HERNIA REPAIR  5 yrs ago   right    LEFT HEART CATH AND CORONARY ANGIOGRAPHY N/A 06/11/2017   Procedure: LEFT HEART CATH AND CORONARY ANGIOGRAPHY;  Surgeon: Belva Crome, MD;  Location: [facility];  Service: Cardiovascular;  Laterality: N/A;   LEFT HEART CATH AND CORONARY ANGIOGRAPHY N/A 08/18/2017   Procedure: LEFT HEART CATH AND CORONARY ANGIOGRAPHY;  Surgeon: Martinique, Peter M, MD;  Location: [facility];  Service: Cardiovascular;  Laterality: N/A;   NM MYOCAR PERF WALL MOTION  11/30/2007   protocol:[place], post EF 67%,mild perfusion defect seen in basal inferior consistant with Attenuation artifact, exercise cap. 12METS   ROBOT ASSISTED LAPAROSCOPIC RADICAL PROSTATECTOMY  05/20/2012   Procedure: ROBOTIC ASSISTED LAPAROSCOPIC RADICAL PROSTATECTOMY LEVEL 1;  Surgeon: Dutch Gray, MD;  Location: [facility];  Service: Urology;  Laterality: N/A;      TRANSURETHRAL RESECTION OF PROSTATE  43yr ago    Current Outpatient Medications  Medication Sig Dispense Refill   ADVAIR DISKUS 250-50 MCG/DOSE AEPB Inhale 1 puff into the lungs daily as needed (For  shortness of Breath).      amLODipine (NORVASC) 2.5 MG tablet Take 1 tablet (2.5 mg total) by mouth daily. (Patient taking differently: Take 2.5 mg by mouth every evening. ) 90 tablet 3   ascorbic acid (VITAMIN C) 500 MG tablet Take 500 mg by mouth daily.      aspirin EC 81 MG tablet Take 1 tablet (81 mg total) by mouth daily. 90 tablet 3   b complex vitamins tablet Take 1 tablet by mouth daily with supper.     bisoprolol (ZEBETA) 5 MG tablet Take  2.5-5 mg by mouth See admin instructions. Take 59m in the morning, then take 2.533min the evening per patient     dofetilide (TIKOSYN) 500 MCG capsule Take 1 capsule (500 mcg total) by mouth 2 (two) times daily. 180 capsule 2   ezetimibe (ZETIA) 10 MG tablet Take 10 mg by mouth every evening.      Multiple Vitamin (MULTIVITAMIN WITH MINERALS) TABS tablet Take 1 tablet by mouth daily with supper.     niacin (NIASPAN) 1000 MG CR tablet Take 1,000 mg by mouth at bedtime.      nitroGLYCERIN (NITROSTAT) 0.4 MG SL tablet Place 1 tablet (0.4 mg total) under the tongue every 5 (five) minutes x 3 doses as needed for chest pain. 30 tablet 0   pantoprazole (PROTONIX) 40 MG tablet Take 40 mg by mouth 2 (two) times daily.      pravastatin (PRAVACHOL) 80 MG tablet Take 80 mg by mouth every evening.      PROAIR HFA 108 (90 BASE) MCG/ACT inhaler Take 2 puffs by mouth 2 (two) times daily as needed. For shortness of breath.     Vitamin D, Ergocalciferol, 2000 units CAPS Take 2,000 Units by mouth at bedtime.      XARELTO 20 MG TABS tablet TAKE 1 TABLET DAILY WITH SUPPER (Patient taking differently: Take 20 mg by mouth daily with supper. ) 90 tablet 1   Current Facility-Administered Medications  Medication Dose Route Frequency Provider Last Rate Last Dose   0.9 %  sodium chloride infusion  500 mL Intravenous Once PeIrene ShipperMD        Allergies:   Gluten meal, Wheat bran, Statins, and Sulfa antibiotics   Social History:  The patient  reports that he has never smoked. He has never used smokeless tobacco. He reports that he does not drink alcohol or use drugs.   Family History:  The patient's family history includes Cancer in his paternal grandmother and sister; Heart attack in his father and paternal grandfather; Heart disease in his father and paternal grandfather; Stroke in his maternal grandfather and mother.   ROS:  Please see the history of present illness.   All other systems are personally  reviewed and negative.    Exam:    Vital Signs:  BP 131/74    Pulse (!) 58    Ht 5' 8"  (1.727 m)    Wt 175 lb (79.4 kg)    BMI 26.61 kg/m   Well sounding and appearing, alert and conversant, regular work of breathing,  good skin color Eyes- anicteric, neuro- grossly intact, skin- no apparent rash or lesions or cyanosis, mouth- oral mucosa is pink  Labs/Other Tests and Data Reviewed:    Recent Labs: 11/01/2018: Hemoglobin 15.9; Platelets 236 01/13/2019: BUN 11; Creatinine, Ser 1.07; Magnesium 2.2; Potassium 4.3; Sodium 138   Wt Readings from Last 3 Encounters:  02/14/19 175 lb (79.4 kg)  01/13/19 179 lb (  81.2 kg)  01/07/19 175 lb 14.4 oz (79.8 kg)    Labs are reviewed ekg from 01/13/2019 reveals sinus rhythm 56 bpm, QTc 445 msec. Echo 01/07/2019- EF 55%, LA 47 mm, mild MR/TR  ASSESSMENT & PLAN:    1.  Persistent atrial fibrillation Doing well s/p initiation of tikosyn Maintaining sinus rhythm He is on xarelto for chads2vasc score of 2.  2. CAD No ischemic symptoms I would advise as per guidelines that he stop ASA as he is also on xarleto but will defer to [name] who sees him tomorrow.  3. HTN Stable No change required today   Follow-up:  3 months in AF clinic [name] as scheduled   Patient Risk:  after full review of this patients clinical status, I feel that they are at moderate risk at this time.  Today, I have spent 15 minutes with the patient with telehealth technology discussing arrhythmia management .    Army Fossa, MD  02/14/2019 10:22 AM     [facility] [address] [place] [address] [phone] (office) [phone] (fax)

## 2019-02-15 ENCOUNTER — Ambulatory Visit (INDEPENDENT_AMBULATORY_CARE_PROVIDER_SITE_OTHER): Payer: Medicare Other | Admitting: Cardiovascular Disease

## 2019-02-15 ENCOUNTER — Encounter: Payer: Self-pay | Admitting: Cardiovascular Disease

## 2019-02-15 ENCOUNTER — Other Ambulatory Visit: Payer: Self-pay

## 2019-02-15 VITALS — BP 113/66 | HR 60 | Temp 97.0°F | Ht 68.0 in | Wt 183.2 lb

## 2019-02-15 DIAGNOSIS — Z9861 Coronary angioplasty status: Secondary | ICD-10-CM

## 2019-02-15 DIAGNOSIS — I10 Essential (primary) hypertension: Secondary | ICD-10-CM | POA: Diagnosis not present

## 2019-02-15 DIAGNOSIS — I480 Paroxysmal atrial fibrillation: Secondary | ICD-10-CM | POA: Diagnosis not present

## 2019-02-15 DIAGNOSIS — Z7901 Long term (current) use of anticoagulants: Secondary | ICD-10-CM | POA: Diagnosis not present

## 2019-02-15 DIAGNOSIS — I2510 Atherosclerotic heart disease of native coronary artery without angina pectoris: Secondary | ICD-10-CM

## 2019-02-15 DIAGNOSIS — I319 Disease of pericardium, unspecified: Secondary | ICD-10-CM

## 2019-02-15 DIAGNOSIS — Z8546 Personal history of malignant neoplasm of prostate: Secondary | ICD-10-CM

## 2019-02-15 NOTE — Progress Notes (Signed)
Patient ID: Jose Warner, male   DOB: 1950/06/03, 68 y.o.   MRN: [id number]     PCP: Jose Warner   HPI: Jose Warner is a 68 y.o. male who presents for a 56-monthfollow-up cardiology evaluation.   [name] a strong family history for premature coronary artery disease. In May 2006 cardiac catheterization showed very mild LAD and circumflex narrowing felt to be not flow limiting at approximately 20-30%. He has a history of low HDL levels. He developed focal prostate cancer and underwent robotic prostatectomy by Dr. GAraceli Warner 2014 When I saw him after his prostate surgery in March 2014 he was in atrial fibrillation at which time he was completely unaware of this rhythm disturbance. He was started on eliquis 5 mg twice a day anticoagulation and as well as metoprolol succinate. He subsequently converted spontaneously to sinus rhythm and has been maintaining sinus rhythm since. He remains active. He exercises daily. He denies any chest pain. He denies shortness of breath. He denies palpitations. His echo Doppler study showed an ejection fraction in the 55-65% range. He had  upper normal LA size. There is mild aortic insufficiency. Pulmonary pressures were normal with an estimated RV systolic pressure 15 mm.  In the past, he had been on Niaspan for low HDL levels. This was stopped when he was also put on cialis following his prostate surgery. He no longer takes cialis.  Laboratory done in April 2014 showed cholesterol 131 triglycerides 96 his HDL remains very low at 23 and his LDL was 89. Thyroid function studies were normal as were his hemoglobin hematocrit and chemistries.  He is retired from [place] is now doing pMidwifework with travel.  He denies any episodes of chest pain.  Dr. BSandi Mariscalhas put him back on his simvastatin 40 mg and niacin 1000 mg based on laboratory that he had done.  He continues to take Bystolic 5 mg for hypertension.  He continues to have  difficulty with erectile function following his prostate surgery.  He also was diagnosed with celiac disease and is extremely sensitive to gluten.   He remains active.  He is not aware of any recurrent arrhythmia.  Dr. BSandi Warner laboratory and he was told that his labs most recently were excellent.  He is tolerating Bystolic 5 mg.  He is followed by Dr. BAlinda Warner his prostate.  He is on a gluten-free diet.   He was hospitalized on 06/09/2017 after having experienced intermittent chest pain  He was felt to potentially have unstableand stenting of a 95% mid LAD stenosiswithin a diffusely diseasedlcified segmen  There also was mild 40% narrowing proximal and distal to the stent.  He was discharged on 06/12/2017.  He felt vague recurrent chest pain leading to an additional overnight hospital stayon from 324.  Troponins were negative.  ECG was without ischemic changes and he was   Since he was bradycardic with heart rates in the 40s.    When I saw him for follow-up evaluation in March 2019 well and was gradually gaining his strength.  Because of his PAF, he was on Plavix and Xarelto.  He also was on bisoprolol 2.5 mg daily.  They continue to be on niacin and simvastatin per Dr. BSandi Mariscaland is on Symbicort with a history of asthma.   He developed a different type of chest pain that was lasted tense but had occurred consecutively on 3 days in a row.  He had taken nitroglycerin  with some improvement.  He was rehospitalized on August 17, 2017 and underwent repeat cardiac catheterization August 18, 2017 by Jose Warner.  His LAD stent was patent but he had 30% proximal, 40% mid, and 20% distal LAD stenoses.  He has felt improved.  He was treated for possible bronchitis and was given Z-Pak by Dr. Sandi Warner.    He was hospitalized from September 9 through December 30, 2017 after developing left upper chest, neck pain associated with left arm numbness not responsive to nitroglycerin.  His ECG did not reveal  ischemic changes.  D-dimer was detectable.  He was diagnosed as having acute pericarditis which was confirmed on MRI imaging and he was treated with ibuprofen in addition to colchicine.  CT also demonstrated cervical spondylosis without acute findings.  His symptoms have gradually improved therapy.   I saw him on January 19, 2018.  At that time he was feeling well but still experienced occasional sharp discomfort but denied any classic pleuritic-like symptoms.  He completed a 62-monthcourse of colchicine for his pericarditis.  He represented to the emergency room on November 1 with recurrent AF and was seen in follow-up in the A. fib clinic with Jose Warner  He was not cardioverted in the ER due to his ongoing treatment for pericarditis and he continued to be on bisoprolol 5 mg daily in addition to Xarelto.  He saw Dr. ARayann Warner follow-up March 08, 2018 and during that evaluation it was noted that he had converted on his own back to sinus rhythm.   When I last saw him in March 2020 he denied any recurrent awareness of atrial fibrillation.  Dr. PFilbert Bertholdis is primary care physician who checks his laboratory.  When I saw him he was without recurrent pleuritic chest pain.    Since my last evaluation with him, the patient developed recurrent atrial fibrillation in July and underwent successful DC cardioversion on November 11, 2018.  Unfortunately, he developed recurrent atrial fibrillation and was therefore admitted to the hospital for Tikosyn loading on September 15 through January 07, 2019.  He converted to sinus rhythm on the medication and did not require repeat DC cardioversion.  He is unaware of any recurrent episodes of A. fib since initiation of Tikosyn.  He has continued on anticoagulation therapy.  He denies any recent pleuritic-like symptoms.  He continues to be on pravastatin 80 mg for hyperlipidemia in addition to niacin per Dr. BSandi Warner due to particle size.  He also has been  on bisoprolol 5 mg in the morning and 2.5 mg at night as well as amlodipine 2.5 mg daily.  He denies any recurrent anginal symptoms.  He presents for follow-up evaluation.  Past Medical History:  Diagnosis Date  . Anemia   . Bronchitis 05-10-12   past fall- 4 runs antibiotics due to bronchitis-uses PDynegyas needed  . Celiac disease   . Colon polyps    adenomatous  . Coronary artery disease   . Diverticulosis   . GERD (gastroesophageal reflux disease)   . Hepatitis 05-10-12   "was told non A, non B"-unclean dental equip.  .Marland KitchenHyperlipidemia   . Iron deficiency anemia   . Pericarditis   . [place] 06/05[place]  . [place] 05-10[place]12   ;bx. 04-12-12, dx  . Spasm of esophagus 2013    Past Surgical History:  Procedure Laterality Date  . CARDIAC CATHETERIZATION  09/13/2004   LAD:30%-40% in prox. to mid segment with  20% in the mid segment and 20% in left Circ., mild 20% right common iliac narrowing. medical therapy  . CARDIOVERSION  06/05/2017  . CARDIOVERSION N/A 11/11/2018   Procedure: CARDIOVERSION;  Surgeon: Pixie Casino, MD;  [facility] ENDOSCOPY;  Service: Cardiovascular;  Laterality: N/A;  . CORONARY STENT INTERVENTION N/A 06/11/2017   Procedure: CORONARY STENT INTERVENTION;  Surgeon: Belva Crome, MD;  Location: [place] CV LAB;  Service: Cardiovascular;  Laterality: N/A;  . HERNIA REPAIR  5 yrs ago   right   . LEFT HEART CATH AND CORONARY ANGIOGRAPHY N/A 06/11/2017   Procedure: LEFT HEART CATH AND CORONARY ANGIOGRAPHY;  Surgeon: Belva Crome, MD;  Location: [place] CV LAB;  Service: Cardiovascular;  Laterality: N/A;  . LEFT HEART CATH AND CORONARY ANGIOGRAPHY N/A 08/18/2017   Procedure: LEFT HEART CATH AND CORONARY ANGIOGRAPHY;  Surgeon: Warner, Peter M, MD;  Location: [place] CV LAB;  Service: Cardiovascular;  Laterality: N/A;  . NM MYOCAR PERF WALL MOTION  11/30/2007   protocol:Bruce, post EF 67%,mild perfusion defect seen  in basal inferior consistant with Attenuation artifact, exercise cap. 12METS  . ROBOT ASSISTED LAPAROSCOPIC RADICAL PROSTATECTOMY  05/20/2012   Procedure: ROBOTIC ASSISTED LAPAROSCOPIC RADICAL PROSTATECTOMY LEVEL 1;  Surgeon: Dutch Gray, MD;  Location: [facility] ORS;  Service: Urology;  Laterality: N/A;     . TRANSURETHRAL RESECTION OF PROSTATE  62yr ago    Allergies  Allergen Reactions  . Gluten Meal Other (See Comments)    Celiac, stomach crams , gas  . Wheat Bran Other (See Comments)    Celiac   . Statins     Leg muscle pains - rosuvastatin, atorvastatin, zetia. Tolerates simvastatin  . Sulfa Antibiotics Rash    Current Outpatient Medications  Medication Sig Dispense Refill  . ADVAIR DISKUS 250-50 MCG/DOSE AEPB Inhale 1 puff into the lungs daily as needed (For shortness of Breath).     .Marland KitchenamLODipine (NORVASC) 2.5 MG tablet Take 1 tablet (2.5 mg total) by mouth daily. (Patient taking differently: Take 2.5 mg by mouth every evening. ) 90 tablet 3  . ascorbic acid (VITAMIN C) 500 MG tablet Take 500 mg by mouth daily.     .Marland Kitchenaspirin EC 81 MG tablet Take 1 tablet (81 mg total) by mouth daily. 90 tablet 3  . b complex vitamins tablet Take 1 tablet by mouth daily with supper.    . bisoprolol (ZEBETA) 5 MG tablet Take 2.5-5 mg by mouth See admin instructions. Take 541min the morning, then take 2.80m22mn the evening per patient    . dofetilide (TIKOSYN) 500 MCG capsule Take 1 capsule (500 mcg total) by mouth 2 (two) times daily. 180 capsule 2  . ezetimibe (ZETIA) 10 MG tablet Take 10 mg by mouth every evening.     . Multiple Vitamin (MULTIVITAMIN WITH MINERALS) TABS tablet Take 1 tablet by mouth daily with supper.    . niacin (NIASPAN) 1000 MG CR tablet Take 1,000 mg by mouth at bedtime.     . nitroGLYCERIN (NITROSTAT) 0.4 MG SL tablet Place 1 tablet (0.4 mg total) under the tongue every 5 (five) minutes x 3 doses as needed for chest pain. 30 tablet 0  . pantoprazole (PROTONIX) 40 MG tablet Take 40 mg  by mouth 2 (two) times daily.     . pravastatin (PRAVACHOL) 80 MG tablet Take 80 mg by mouth every evening.     . PMarland KitchenOAIR HFA 108 (90 BASE) MCG/ACT inhaler Take 2 puffs by  mouth 2 (two) times daily as needed. For shortness of breath.    . Vitamin D, Ergocalciferol, 2000 units CAPS Take 2,000 Units by mouth at bedtime.     Alveda Reasons 20 MG TABS tablet TAKE 1 TABLET DAILY WITH SUPPER (Patient taking differently: Take 20 mg by mouth daily with supper. ) 90 tablet 1   Current Facility-Administered Medications  Medication Dose Route Frequency Provider Last Rate Last Dose  . 0.9 %  sodium chloride infusion  500 mL Intravenous Once Irene Shipper, MD        Social History   Socioeconomic History  . Marital status: Married    Spouse name: Not on file  . Number of children: 3  . Years of education: Not on file  . Highest education level: Not on file  Occupational History    Employer: [employer]  . Financial resource strain: Not on file  . Food insecurity    Worry: Patient refused    Inability: Patient refused  . Transportation needs    Medical: Patient refused    Non-medical: Patient refused  Tobacco Use  . Smoking status: Never Smoker  . Smokeless tobacco: Never Used  Substance and Sexual Activity  . Alcohol use: Never    Alcohol/week: 2.0 standard drinks    Types: 2 Glasses of wine per week    Frequency: Never  . Drug use: No  . Sexual activity: Yes    Partners: Female  Lifestyle  . Physical activity    Days per week: Not on file    Minutes per session: Not on file  . Stress: Not on file  Relationships  . Social Herbalist on phone: Patient refused    Gets together: Patient refused    Attends religious service: Patient refused    Active member of club or organization: Patient refused    Attends meetings of clubs or organizations: Patient refused    Relationship status: Patient refused  . Intimate partner violence    Fear of current or ex partner:  Patient refused    Emotionally abused: Patient refused    Physically abused: Patient refused    Forced sexual activity: Patient refused  Other Topics Concern  . Not on file  Social History Narrative  . Not on file    Socially, he is a PhD and Is now retired from [employer].  He had been in the turf business and floral arrangements.  ROS General: Negative; No fevers, chills, or night sweats;  HEENT: Negative; No changes in vision or hearing, sinus congestion, difficulty swallowing Pulmonary: Negative; No cough, wheezing, shortness of breath, hemoptysis Cardiovascular: see HPI GI: Positive for celiac disease GU: Positive for erectile dysfunction since his robotic prostatic surgery No dysuria, hematuria, or difficulty voiding Musculoskeletal: Negative; no myalgias, joint pain, or weakness Hematologic/Oncology: Negative; no easy bruising, bleeding Endocrine: Negative; no heat/cold intolerance; no diabetes Neuro: Negative; no changes in balance, headaches Skin: Negative; No rashes or skin lesions Psychiatric: Negative; No behavioral problems, depression Sleep: Negative; No snoring, daytime sleepiness, hypersomnolence, bruxism, restless legs, hypnogognic hallucinations, no cataplexy Other comprehensive 14 point system review is negative.   PE BP 113/66   Pulse 60   Temp (!) 97 F (36.1 C)   Ht 5' 8"  (1.727 m)   Wt 183 lb 3.2 oz (83.1 kg)   SpO2 96%   BMI 27.86 kg/m     Repeat blood pressure by me was 120/66  Wt Readings from Last 3  Encounters:  02/15/19 183 lb 3.2 oz (83.1 kg)  02/14/19 175 lb (79.4 kg)  01/13/19 179 lb (81.2 kg)   General: Alert, oriented, no distress.  Skin: normal turgor, no rashes, warm and dry HEENT: Normocephalic, atraumatic. Pupils equal round and reactive to light; sclera anicteric; extraocular muscles intact;  Nose without nasal septal hypertrophy Mouth/Parynx benign; Mallinpatti scale 3 Neck: No JVD, no carotid bruits; normal carotid  upstroke Lungs: clear to ausculatation and percussion; no wheezing or rales Chest wall: without tenderness to palpitation Heart: PMI not displaced, RRR, s1 s2 normal, 1/6 systolic murmur, no diastolic murmur, no rubs, gallops, thrills, or heaves Abdomen: soft, nontender; no hepatosplenomehaly, BS+; abdominal aorta nontender and not dilated by palpation. Back: no CVA tenderness Pulses 2+ Musculoskeletal: full range of motion, normal strength, no joint deformities Extremities: no clubbing cyanosis or edema, Homan's sign negative  Neurologic: grossly nonfocal; Cranial nerves grossly wnl Psychologic: Normal mood and affect    ECG (independently read by me): NSR at 61; no ectopy: QTc 473 msec  March 2019 ECG (independently read by me): Sinus bradycardia 54 bpm.  No ectopy.  Normal intervals.  October 2019 ECG (independently read by me): Sinus bradycardia at 46 bpm.  No ectopy.  Normal intervals.  No ST segment abnormalities  May 2019 ECG (independently read by me): Normal sinus rhythm at 66 bpm.normal sinus rhythm at 66 bpm.  No ectopy.  Normal intervals.  No ST segment depression. No ectopy.  Normal intervals.  No ST segment depression.  July 02, 2017 ECG (independently read by me): sinus bradycardia 51 bpm.  No ST segment changes.  Normal intervals.  November 2018ECG (independently read by me): Sinus bradycardia 56 bpm.  No ectopy.  Normal intervals.  October 2017 ECG (independently read by me): Sinus bradycardia 59 bpm.  Normal intervals.  No ectopy.  May 2015 ECG (and apparently read by me): Normal sinus rhythm at 60 beats per minute.  Early transition.  No ectopy.  PR interval 174 ms, QTc interval 460 ms.  ECG: (independently read by me): Sinus rhythm at 56 beats per minute. No ectopy. Normal intervals.  LABS:  BMP Latest Ref Rng & Units 01/13/2019 01/07/2019 01/06/2019  Glucose 70 - 99 mg/dL 122(H) 113(H) 103(H)  BUN 8 - 23 mg/dL 11 12 16   Creatinine 0.61 - 1.24 mg/dL 1.07 1.10  1.10  Sodium 135 - 145 mmol/L 138 138 138  Potassium 3.5 - 5.1 mmol/L 4.3 3.9 4.2  Chloride 98 - 111 mmol/L 108 108 106  CO2 22 - 32 mmol/L 21(L) 23 24  Calcium 8.9 - 10.3 mg/dL 8.5(L) 8.2(L) 8.2(L)    Hepatic Function Panel     Component Value Date/Time   PROT 5.7 (L) 06/12/2017 0804     CBC Latest Ref Rng & Units 11/01/2018 02/19/2018 12/29/2017  WBC 4.0 - 10.5 K/uL 5.7 6.0 5.0  Hemoglobin 13.0 - 17.0 g/dL 15.9 16.3 13.0  Hematocrit 39.0 - 52.0 % 49.2 52.0 41.0  Platelets 150 - 400 K/uL 236 215 165    BNP No results found for: PROBNP  Lipid Panel     Component Value Date/Time   CHOL 122 06/10/2017 0108     RADIOLOGY: No results found.  IMPRESSION:  1. Paroxysmal atrial fibrillation (HCC)   2. CAD S/P DES stent to LAD 05/2017   3. Anticoagulation adequate   4. Essential hypertension   5. Pericarditis, resolved   6. History of prostate cancer     ASSESSMENT AND PLAN: Jose Warner  Is a 68 year-old gentleman who had undergone cardiac catheterization in May 2006 and was found to have mild nonobstructive CAD.  He has a history of hyperlipidemia with very low HDL levels and has been on niacin in addition to his statin which previously was simvastatin 40 mg and now he is on pravastatin 80 mg daily followed by Dr. Sandi Warner.   He developed atrial fibrillation for which he was unaware following his robotic prostatectomy for his prostate cancer.  He was hospitalized in February 2019 with intermittent chest pain.  Due to worrisome symptoms of unstable angina, catheterization was performed and revealed a 95% mid LAD stenosis in a calcified segment and mild additional concomitant CAD.  Initially he received triple therapy but ultimately aspirin was discontinued and he was maintained on Plavix and anticoagulation.Marland Kitchen  He developed repeat chest pain leading to repeat catheterization on August 18, 2017.  This chest pain was less intense character but also did seem to be somewhat nitrate  responsive.  He also took a Z-Pak for potential treatment of bronchitis.  He was diagnosed with acute pericarditis confirmed by MRI imaging September 2019. He was on Plavix in addition to Xarelto for his anticoagulation with his recent use of nonsteroidal anti-inflammatory medication and treatment with colchicine and I saw him in follow-up since it had been over 6 months since his stent placement I recommended he discontinue Plavix but resume a baby aspirin 81 mg and continue Xarelto.  He was on colchicine for at least 3 months duration.  Over time ultimately his pericarditis symptomatology ultimately resolved.  Unfortunately he has had continued experiences with atrial fibrillation and has required repeat cardioversion and due to recurrence was recently hospitalized and underwent successful Tikosyn loading at 500 mg twice a day with pharmacologic conversion back to sinus rhythm.  His ECG today confirms normal sinus rhythm at 61 bpm with a QTc interval of 461.  Presently, he is not having any bleeding.  His blood pressure is controlled.  His pericarditis has completely resolved however it did take numerous months for complete resolution of symptomatology.  I did discuss with him possibly discontinuing niacin particularly if triglyceride levels are normal but Dr. Sandi Warner has been following this with him and has still been checking particle size and due to his small particles had advised he continue on the niacin combination with pravastatin 80 mg and Zetia 10 mg.  Target LDL is less than 70.  At present he is not having any anginal symptomatology and continues to be on amlodipine 2.5 mg, and bisoprolol 5 mg in the morning and 2.5 mg in the evening.  We discussed possible discontinuance of aspirin in the future if his ischemic risk remains extremely low with continued NOAC therapy.  Time spent 25 minutes Troy Sine, MD, [facility]  02/17/2019  4:47 PM

## 2019-02-15 NOTE — Patient Instructions (Signed)
Follow-Up: IN 6 months Please call our office 2 months in advance, JAN 2021 to schedule this APR 2021 appointment. In Person You may see Shelva Majestic, MD or one of the following Advanced Practice Providers on your designated Care Team:  Almyra Deforest, PA-C Fabian Sharp, PA-C or [place], [place].  PLEASE FAX LAB WORK TO [phone]    Medication Instructions:  The current medical regimen is effective;  continue present plan and medications as directed. Please refer to the Current Medication list given to you today. If you need a refill on your cardiac medications before your next appointment, please call your pharmacy.  At [facility], you and your health needs are our priority.  As part of our continuing mission to provide you with exceptional heart care, we have created designated Provider Care Teams.  These Care Teams include your primary Cardiologist (physician) and Advanced Practice Providers (APPs -  Physician Assistants and Nurse Practitioners) who all work together to provide you with the care you need, when you need it.  Thank you for choosing [facility] at [facility]!!

## 2019-02-17 ENCOUNTER — Encounter: Payer: Self-pay | Admitting: Cardiovascular Disease

## 2019-03-02 ENCOUNTER — Telehealth (HOSPITAL_COMMUNITY): Payer: Self-pay | Admitting: *Deleted

## 2019-03-02 NOTE — Telephone Encounter (Signed)
Patient called in stating he went back in AF last night HR 95-110 unable to get his wrist BP cuff to read his BP which isn't unusual when in AF. Pt states he feels like he normally does in AF but has noticed some left arm tingling which is new for him. His HR in AF does seem to be higher than it normally is since staying over 100. He took 2.5mg  of bisoprolol last night and his normal 5mg  this morning. Discussed with Adline Peals PA - will take another 2.5mg  of bisoprolol now and call this afternoon with report. Pt in agreement.

## 2019-03-02 NOTE — Telephone Encounter (Signed)
Patient called to report he converted back into normal rhythm HR in the 45-50 range. He does endorse feeling lightheaded/dizzy prior to converting. Pt now feels well. Discussed with Adline Peals PA - will reduce bisoprolol back to 5mg  once a day. May take extra 2.5mg  a day for breakthrough afib. Pt verbalized understanding.

## 2019-05-19 ENCOUNTER — Ambulatory Visit (HOSPITAL_COMMUNITY): Payer: Medicare Other | Admitting: Physician Assistant

## 2019-05-19 ENCOUNTER — Other Ambulatory Visit: Payer: Self-pay

## 2019-05-19 ENCOUNTER — Ambulatory Visit (HOSPITAL_COMMUNITY)
Admission: RE | Admit: 2019-05-19 | Discharge: 2019-05-19 | Disposition: A | Discharge status: Home / Self Care | Payer: Medicare Other | Source: Ambulatory Visit | Attending: Physician Assistant | Admitting: Physician Assistant

## 2019-05-19 ENCOUNTER — Encounter: Payer: Self-pay | Admitting: Cardiovascular Disease

## 2019-05-19 VITALS — BP 128/60 | HR 52 | Ht 68.0 in | Wt 181.0 lb

## 2019-05-19 DIAGNOSIS — D6869 Other thrombophilia: Secondary | ICD-10-CM | POA: Diagnosis not present

## 2019-05-19 DIAGNOSIS — I2510 Atherosclerotic heart disease of native coronary artery without angina pectoris: Secondary | ICD-10-CM | POA: Insufficient documentation

## 2019-05-19 DIAGNOSIS — I4819 Other persistent atrial fibrillation: Secondary | ICD-10-CM | POA: Diagnosis present

## 2019-05-19 DIAGNOSIS — Z882 Allergy status to sulfonamides status: Secondary | ICD-10-CM | POA: Insufficient documentation

## 2019-05-19 DIAGNOSIS — Z8546 Personal history of malignant neoplasm of prostate: Secondary | ICD-10-CM | POA: Insufficient documentation

## 2019-05-19 DIAGNOSIS — Z823 Family history of stroke: Secondary | ICD-10-CM | POA: Insufficient documentation

## 2019-05-19 DIAGNOSIS — K219 Gastro-esophageal reflux disease without esophagitis: Secondary | ICD-10-CM | POA: Diagnosis not present

## 2019-05-19 DIAGNOSIS — Z8249 Family history of ischemic heart disease and other diseases of the circulatory system: Secondary | ICD-10-CM | POA: Insufficient documentation

## 2019-05-19 DIAGNOSIS — R001 Bradycardia, unspecified: Secondary | ICD-10-CM | POA: Diagnosis not present

## 2019-05-19 DIAGNOSIS — Z7901 Long term (current) use of anticoagulants: Secondary | ICD-10-CM | POA: Diagnosis not present

## 2019-05-19 DIAGNOSIS — Z7982 Long term (current) use of aspirin: Secondary | ICD-10-CM | POA: Insufficient documentation

## 2019-05-19 DIAGNOSIS — Z79899 Other long term (current) drug therapy: Secondary | ICD-10-CM | POA: Insufficient documentation

## 2019-05-19 DIAGNOSIS — E785 Hyperlipidemia, unspecified: Secondary | ICD-10-CM | POA: Insufficient documentation

## 2019-05-19 DIAGNOSIS — R9431 Abnormal electrocardiogram [ECG] [EKG]: Secondary | ICD-10-CM | POA: Diagnosis not present

## 2019-05-19 DIAGNOSIS — Z888 Allergy status to other drugs, medicaments and biological substances status: Secondary | ICD-10-CM | POA: Insufficient documentation

## 2019-05-19 DIAGNOSIS — Z91018 Allergy to other foods: Secondary | ICD-10-CM | POA: Diagnosis not present

## 2019-05-19 DIAGNOSIS — Z809 Family history of malignant neoplasm, unspecified: Secondary | ICD-10-CM | POA: Insufficient documentation

## 2019-05-19 DIAGNOSIS — I080 Rheumatic disorders of both mitral and aortic valves: Secondary | ICD-10-CM | POA: Diagnosis not present

## 2019-05-19 MED ORDER — RIVAROXABAN 20 MG PO TABS: 20.0000 mg | ORAL_TABLET | Freq: Every day | ORAL | 2 refills | Status: DC

## 2019-05-19 NOTE — Progress Notes (Addendum)
Primary Care Physician: Derinda Late, MD Referring Physician: [facility] F/u Primary Cardiologist: [name] Primary EP: Dr Becky Sax Jose Warner is a 69 y.o. male with a h/o persistent afib, HLD, CAD, and prostate cancer who presents to the [facility] Clinic for follow up. Patient loaded on dofetilide 9/15-9/18/20. He converted to SR on the medication and did not require DCCV. He is on Xarelto for a CHADS2VASC score of 2.  On follow up today, patient reports that he has done well since his last visit. He has not had any heart racing or palpitations recently. He does occasionally note heart rates in the 40s associated with fatigue and lightheadedness.   Today, he denies symptoms of palpitations, SOB, orthopnea, PND, lower extremity edema, presyncope, syncope, or neurologic sequela. The patient is tolerating medications without difficulties and is otherwise without complaint today.   Past Medical History:  Diagnosis Date  . Anemia   . Bronchitis 05-10-12   past fall- 4 runs antibiotics due to bronchitis-uses [employer] as needed  . Celiac disease   . Colon polyps    adenomatous  . Coronary artery disease   . Diverticulosis   . GERD (gastroesophageal reflux disease)   . Hepatitis 05-10-12   "was told non A, non B"-unclean dental equip.  Marland Kitchen Hyperlipidemia   . Iron deficiency anemia   . Pericarditis   . Persistent atrial fibrillation ([place]) 06/05/2017  . Prostate cancer ([place]) 05-10-12   ;bx. 04-12-12, dx  . Spasm of esophagus 2013   Past Surgical History:  Procedure Laterality Date  . CARDIAC CATHETERIZATION  09/13/2004   LAD:30%-40% in prox. to mid segment with 20% in the mid segment and 20% in left Circ., mild 20% right common iliac narrowing. medical therapy  . CARDIOVERSION  06/05/2017  . CARDIOVERSION N/A 11/11/2018   Procedure: CARDIOVERSION;  Surgeon: Pixie Casino, MD;  Location: [facility] ENDOSCOPY;  Service: Cardiovascular;  Laterality: N/A;  . CORONARY STENT  INTERVENTION N/A 06/11/2017   Procedure: CORONARY STENT INTERVENTION;  Surgeon: Belva Crome, MD;  Location: [place] CV LAB;  Service: Cardiovascular;  Laterality: N/A;  . HERNIA REPAIR  5 yrs ago   right   . LEFT HEART CATH AND CORONARY ANGIOGRAPHY N/A 06/11/2017   Procedure: LEFT HEART CATH AND CORONARY ANGIOGRAPHY;  Surgeon: Belva Crome, MD;  Location: [place] CV LAB;  Service: Cardiovascular;  Laterality: N/A;  . LEFT HEART CATH AND CORONARY ANGIOGRAPHY N/A 08/18/2017   Procedure: LEFT HEART CATH AND CORONARY ANGIOGRAPHY;  Surgeon: Martinique, Peter M, MD;  Location: [place] CV LAB;  Service: Cardiovascular;  Laterality: N/A;  . NM MYOCAR PERF WALL MOTION  11/30/2007   protocol:[place], post EF 67%,mild perfusion defect seen in basal inferior consistant with Attenuation artifact, exercise cap. 12METS  . ROBOT ASSISTED LAPAROSCOPIC RADICAL PROSTATECTOMY  05/20/2012   Procedure: ROBOTIC ASSISTED LAPAROSCOPIC RADICAL PROSTATECTOMY LEVEL 1;  Surgeon: Dutch Gray, MD;  Location: [facility] ORS;  Service: Urology;  Laterality: N/A;     . TRANSURETHRAL RESECTION OF PROSTATE  71yr ago    Current Outpatient Medications  Medication Sig Dispense Refill  . ADVAIR DISKUS 250-50 MCG/DOSE AEPB Inhale 1 puff into the lungs as needed (For shortness of Breath).     .Marland KitchenamLODipine (NORVASC) 2.5 MG tablet Take 1 tablet (2.5 mg total) by mouth daily. (Patient taking differently: Take 2.5 mg by mouth every evening. ) 90 tablet 3  . ascorbic acid (VITAMIN C) 500 MG tablet Take 500  mg by mouth daily.     Marland Kitchen aspirin EC 81 MG tablet Take 1 tablet (81 mg total) by mouth daily. 90 tablet 3  . b complex vitamins tablet Take 1 tablet by mouth daily with supper.    . bisoprolol (ZEBETA) 5 MG tablet Take 2.5 mg by mouth See admin instructions.    . Cholecalciferol 50 MCG (2000 UT) CAPS Vitamin D3 50 mcg (2,000 unit) capsule  Take by oral route.    . dofetilide (TIKOSYN) 500 MCG capsule Take 1 capsule (500 mcg total) by  mouth 2 (two) times daily. 180 capsule 2  . ezetimibe (ZETIA) 10 MG tablet Take 10 mg by mouth every evening.     . Multiple Vitamin (MULTIVITAMIN WITH MINERALS) TABS tablet Take 1 tablet by mouth daily with supper.    . niacin (NIASPAN) 1000 MG CR tablet Take 1,000 mg by mouth at bedtime.     . nitroGLYCERIN (NITROSTAT) 0.4 MG SL tablet Place 1 tablet (0.4 mg total) under the tongue every 5 (five) minutes x 3 doses as needed for chest pain. 30 tablet 0  . pantoprazole (PROTONIX) 40 MG tablet Take 40 mg by mouth 2 (two) times daily.     . pravastatin (PRAVACHOL) 80 MG tablet Take 80 mg by mouth every evening.     Marland Kitchen PROAIR HFA 108 (90 BASE) MCG/ACT inhaler Take 2 puffs by mouth as needed. For shortness of breath.    . rivaroxaban (XARELTO) 20 MG TABS tablet Take 1 tablet (20 mg total) by mouth daily with supper. 90 tablet 2  . Vitamin D, Ergocalciferol, 2000 units CAPS Take 2,000 Units by mouth at bedtime.      Current Facility-Administered Medications  Medication Dose Route Frequency Provider Last Rate Last Admin  . 0.9 %  sodium chloride infusion  500 mL Intravenous Once Irene Shipper, MD        Allergies  Allergen Reactions  . Gluten Meal Other (See Comments)    Celiac, stomach crams , gas  . Wheat Bran Other (See Comments)    Celiac   . Statins     Leg muscle pains - rosuvastatin, atorvastatin, zetia. Tolerates simvastatin  . Sulfa Antibiotics Rash    Social History   Socioeconomic History  . Marital status: Married    Spouse name: Not on file  . Number of children: 3  . Years of education: Not on file  . Highest education level: Not on file  Occupational History    Employer: [employer]  Tobacco Use  . Smoking status: Never Smoker  . Smokeless tobacco: Never Used  Substance and Sexual Activity  . Alcohol use: Never    Alcohol/week: 2.0 standard drinks    Types: 2 Glasses of wine per week  . Drug use: No  . Sexual activity: Yes    Partners: Female  Other Topics Concern   . Not on file  Social History Narrative  . Not on file   Social Determinants of Health   Financial Resource Strain:   . Difficulty of Paying Living Expenses: Not on file  Food Insecurity:   . Worried About Charity fundraiser in the Last Year: Not on file  . Ran Out of Food in the Last Year: Not on file  Transportation Needs:   . Lack of Transportation (Medical): Not on file  . Lack of Transportation (Non-Medical): Not on file  Physical Activity:   . Days of Exercise per Week: Not on file  . Minutes  of Exercise per Session: Not on file  Stress:   . Feeling of Stress : Not on file  Social Connections:   . Frequency of Communication with Friends and Family: Not on file  . Frequency of Social Gatherings with Friends and Family: Not on file  . Attends Religious Services: Not on file  . Active Member of Clubs or Organizations: Not on file  . Attends Archivist Meetings: Not on file  . Marital Status: Not on file  Intimate Partner Violence:   . Fear of Current or Ex-Partner: Not on file  . Emotionally Abused: Not on file  . Physically Abused: Not on file  . Sexually Abused: Not on file    Family History  Problem Relation Age of Onset  . Stroke Mother   . Heart disease Father   . Heart attack Father   . Cancer Sister   . Stroke Maternal Grandfather   . Cancer Paternal Grandmother   . Heart attack Paternal Grandfather   . Heart disease Paternal Grandfather   . Colon cancer Neg Hx   . Esophageal cancer Neg Hx   . Pancreatic cancer Neg Hx   . Rectal cancer Neg Hx   . Stomach cancer Neg Hx     ROS- All systems are reviewed and negative except as per the HPI above  Physical Exam: Vitals:   05/19/19 0908  BP: 128/60  Pulse: (!) 52  Weight: 82.1 kg  Height: 5' 8"  (1.727 m)   Wt Readings from Last 3 Encounters:  05/19/19 82.1 kg  02/15/19 83.1 kg  02/14/19 79.4 kg    Labs: Lab Results  Component Value Date   NA 138 01/13/2019   K 4.3 01/13/2019   CL  108 01/13/2019   CO2 21 (L) 01/13/2019   GLUCOSE 122 (H) 01/13/2019   BUN 11 01/13/2019   CREATININE 1.07 01/13/2019   CALCIUM 8.5 (L) 01/13/2019   PHOS 3.7 12/28/2017   MG 2.2 01/13/2019   Lab Results  Component Value Date   INR 1.18 06/11/2017   Lab Results  Component Value Date   CHOL 122 06/10/2017   HDL 41 06/10/2017   LDLCALC 60 06/10/2017   TRIG 103 06/10/2017    GEN- The patient is well appearing, alert and oriented x 3 today.   HEENT-head normocephalic, atraumatic, sclera clear, conjunctiva pink, hearing intact, trachea midline. Lungs- Clear to ausculation bilaterally, normal work of breathing Heart- Regular rate and rhythm, no murmurs, rubs or gallops  GI- soft, NT, ND, + BS Extremities- no clubbing, cyanosis, or edema MS- no significant deformity or atrophy Skin- no rash or lesion Psych- euthymic mood, full affect Neuro- strength and sensation are intact   EKG- SB HR 52, PR 172, QRS 90, QTc manually calculated 458 ms  Epic records reviewed  Cardiac CT- 9/10-IMPRESSION: 1. Normal left ventricular size, thickness and hyperdynamic systolic function (LVEF = 71%). There are no regional wall motion abnormalities. There is no late gadolinium enhancement in the left ventricular myocardium. 2. Normal right ventricular size, thickness and systolic function (LVEF = 73%). There are no regional wall motion abnormalities. 3. Mildly dilated left atrium. There is atypical LGE of the left and right atrial walls suspicious for fibrosis. 4. Normal size of the aortic root, ascending aorta and pulmonary artery. 5.  Mild aortic and mitral and trivial tricuspid regurgitation. 6. No pericardial effusion. There is mild late gadolinium enhancement adjacent to the inferolateral and anterior left ventricular walls and RV free wall.  Collectively, these findings are consistent with an acute pericarditis. LVEF has improved when compared to the prior echocardiogram on 06/10/2017 from  50-55% to 71%.  Echo 01/07/19 demonstrated  1. Left ventricular ejection fraction, by visual estimation, is 55 to 60%. The left ventricle has normal function. Normal left ventricular size. There is mildly increased left ventricular hypertrophy.  2. Elevated left ventricular end-diastolic pressure.  3. Left ventricular diastolic Doppler parameters are consistent with pseudonormalization pattern of LV diastolic filling.  4. Global right ventricle has normal systolic function.The right ventricular size is normal. No increase in right ventricular wall thickness.  5. Left atrial size was moderately dilated.  6. Right atrial size was normal.  7. The mitral valve is normal in structure. Mild mitral valve regurgitation. No evidence of mitral stenosis.  8. The tricuspid valve is normal in structure. Tricuspid valve regurgitation is mild.  9. The aortic valve is normal in structure. Aortic valve regurgitation is mild to moderate by color flow Doppler. Structurally normal aortic valve, with no evidence of sclerosis or stenosis. 10. The pulmonic valve was normal in structure. Pulmonic valve regurgitation is not visualized by color flow Doppler. 11. The inferior vena cava is normal in size with greater than 50% respiratory variability, suggesting right atrial pressure of 3 mmHg.   Assessment and Plan: 1. Persistent atrial fibrillation S/p successful DCCV 11/11/18 but had recurrent AF.  S/p dofetilide loading 9/15-9/18/20. Patient appears to be maintaining SR. Continue dofetilide 500 mcg BID. QT stable. Lab slip given for bmet/mag today. Continue Xarelto 20 mg daily Decrease bisoprolol to 2.5 mg daily. Patient may be interested in ablation if he fails dofetilide.   This patients CHA2DS2-VASc Score and unadjusted Ischemic Stroke Rate (% per year) is equal to 2.2 % stroke rate/year from a score of 2  Above score calculated as 1 point each if present [CHF, HTN, DM, Vascular=MI/PAD/Aortic Plaque, Age if  65-74, or Male] Above score calculated as 2 points each if present [Age > 75, or Stroke/TIA/TE]  2. CAD DES to LAD 06/11/17. No anginal symptoms. Followed by [name].  3. Bradycardia Decrease BB as above.    Follow up in the AF clinic in 3 months.  Addendum: Labs from PCP 05/19/19 Hgb 14.8 Cr 1.15, K+ 4.5, magnesium 2.2   Adline Peals PA-C [facility] [address] [place], [place] [address] [phone]

## 2019-05-19 NOTE — Patient Instructions (Signed)
Decrease bisoprolol 2.82m once a day

## 2019-05-25 NOTE — Addendum Note (Signed)
Encounter addended by: Oliver Barre, PA on: 05/25/2019 11:30 AM  Actions taken: Clinical Note Signed

## 2019-07-19 ENCOUNTER — Telehealth (HOSPITAL_COMMUNITY): Payer: Self-pay

## 2019-07-19 NOTE — Telephone Encounter (Signed)
Patient called regarding his Tikosyn medication. He was wondering which antibiotic medication he can take for a sinus infection and which one to not take with the Tikosyn. Advised patient Doxycycline, Amoxicillin or Augmentin is fine to take and he was told to avoid Fluoroquinolones and Z-pak. Consulted with patient and he verbalized understanding.

## 2019-08-02 ENCOUNTER — Encounter (HOSPITAL_COMMUNITY): Payer: Self-pay | Admitting: Emergency Medicine

## 2019-08-02 ENCOUNTER — Telehealth: Payer: Self-pay | Admitting: Cardiovascular Disease

## 2019-08-02 ENCOUNTER — Emergency Department (HOSPITAL_COMMUNITY): Payer: Medicare Other

## 2019-08-02 ENCOUNTER — Ambulatory Visit
Admission: RE | Admit: 2019-08-02 | Discharge: 2019-08-02 | Disposition: A | Discharge status: Home / Self Care | Payer: Medicare Other | Source: Ambulatory Visit | Attending: Family Medicine | Admitting: Family Medicine

## 2019-08-02 ENCOUNTER — Other Ambulatory Visit: Payer: Self-pay | Admitting: Family Medicine

## 2019-08-02 ENCOUNTER — Emergency Department (HOSPITAL_COMMUNITY)
Admission: EM | Admit: 2019-08-02 | Discharge: 2019-08-02 | Disposition: A | Discharge status: Home / Self Care | Payer: Medicare Other | Attending: Emergency Medicine | Admitting: Emergency Medicine

## 2019-08-02 DIAGNOSIS — R0789 Other chest pain: Secondary | ICD-10-CM | POA: Insufficient documentation

## 2019-08-02 DIAGNOSIS — R0602 Shortness of breath: Secondary | ICD-10-CM

## 2019-08-02 DIAGNOSIS — Z8546 Personal history of malignant neoplasm of prostate: Secondary | ICD-10-CM | POA: Insufficient documentation

## 2019-08-02 DIAGNOSIS — R079 Chest pain, unspecified: Secondary | ICD-10-CM

## 2019-08-02 DIAGNOSIS — R0989 Other specified symptoms and signs involving the circulatory and respiratory systems: Secondary | ICD-10-CM

## 2019-08-02 DIAGNOSIS — Z7901 Long term (current) use of anticoagulants: Secondary | ICD-10-CM | POA: Diagnosis not present

## 2019-08-02 DIAGNOSIS — B3781 Candidal esophagitis: Secondary | ICD-10-CM | POA: Insufficient documentation

## 2019-08-02 DIAGNOSIS — Z7982 Long term (current) use of aspirin: Secondary | ICD-10-CM | POA: Insufficient documentation

## 2019-08-02 DIAGNOSIS — I2510 Atherosclerotic heart disease of native coronary artery without angina pectoris: Secondary | ICD-10-CM | POA: Diagnosis not present

## 2019-08-02 DIAGNOSIS — I4891 Unspecified atrial fibrillation: Secondary | ICD-10-CM | POA: Diagnosis not present

## 2019-08-02 DIAGNOSIS — Z79899 Other long term (current) drug therapy: Secondary | ICD-10-CM | POA: Insufficient documentation

## 2019-08-02 LAB — BASIC METABOLIC PANEL
Anion gap: 11 (ref 5–15)
BUN: 13 mg/dL (ref 8–23)
CO2: 21 mmol/L — ABNORMAL LOW (ref 22–32)
Calcium: 9.1 mg/dL (ref 8.9–10.3)
Chloride: 107 mmol/L (ref 98–111)
Creatinine, Ser: 1.04 mg/dL (ref 0.61–1.24)
GFR calc Af Amer: >60 mL/min (ref >60)
GFR calc non Af Amer: >60 mL/min (ref >60)
Glucose, Bld: 114 mg/dL — ABNORMAL HIGH (ref 70–99)
Potassium: 4.4 mmol/L (ref 3.5–5.1)
Sodium: 139 mmol/L (ref 135–145)

## 2019-08-02 LAB — CBC
HCT: 46.9 % (ref 39.0–52.0)
Hemoglobin: 14.7 g/dL (ref 13.0–17.0)
MCH: 28.1 pg (ref 26.0–34.0)
MCHC: 31.3 g/dL (ref 30.0–36.0)
MCV: 89.7 fL (ref 80.0–100.0)
Platelets: 241 10*3/uL (ref 150–400)
RBC: 5.23 MIL/uL (ref 4.22–5.81)
RDW: 14.5 % (ref 11.5–15.5)
WBC: 7.8 10*3/uL (ref 4.0–10.5)
nRBC: 0 % (ref 0.0–0.2)

## 2019-08-02 LAB — TROPONIN I (HIGH SENSITIVITY)
Troponin I (High Sensitivity): 6 ng/L (ref <18)
Troponin I (High Sensitivity): 5 ng/L (ref <18)

## 2019-08-02 MED ORDER — ASPIRIN 81 MG PO CHEW
324.0000 mg | CHEWABLE_TABLET | Freq: Once | ORAL | Status: AC
  Administered 2019-08-02: 324 mg via ORAL
  Filled 2019-08-02: qty 4

## 2019-08-02 MED ORDER — NYSTATIN 100000 UNIT/ML MT SUSP
2.0000 mL | Freq: Four times a day (QID) | OROMUCOSAL | Status: DC
  Administered 2019-08-02: 200000 [IU] via ORAL
  Filled 2019-08-02: qty 5

## 2019-08-02 MED ORDER — DOFETILIDE 500 MCG PO CAPS
500.0000 ug | ORAL_CAPSULE | Freq: Once | ORAL | Status: AC
  Administered 2019-08-02: 500 ug via ORAL
  Filled 2019-08-02: qty 1

## 2019-08-02 MED ORDER — NITROGLYCERIN 0.4 MG SL SUBL
0.4000 mg | SUBLINGUAL_TABLET | Freq: Once | SUBLINGUAL | Status: AC
  Administered 2019-08-02: 0.4 mg via SUBLINGUAL
  Filled 2019-08-02: qty 1

## 2019-08-02 MED ORDER — SODIUM CHLORIDE 0.9% FLUSH
3.0000 mL | Freq: Once | INTRAVENOUS | Status: AC
  Administered 2019-08-02: 3 mL via INTRAVENOUS

## 2019-08-02 MED ORDER — ALUM & MAG HYDROXIDE-SIMETH 200-200-20 MG/5ML PO SUSP
30.0000 mL | Freq: Once | ORAL | Status: AC
  Administered 2019-08-02: 30 mL via ORAL
  Filled 2019-08-02: qty 30

## 2019-08-02 MED ORDER — NYSTATIN NICU ORAL SYRINGE 100,000 UNITS/ML
2.0000 mL | Freq: Four times a day (QID) | OROMUCOSAL | Status: DC
  Filled 2019-08-02 (×3): qty 2

## 2019-08-02 MED ORDER — LIDOCAINE VISCOUS HCL 2 % MT SOLN
15.0000 mL | Freq: Once | OROMUCOSAL | Status: AC
  Administered 2019-08-02: 15 mL via ORAL
  Filled 2019-08-02: qty 15

## 2019-08-02 NOTE — Telephone Encounter (Signed)
Pt c/o of Chest Pain: STAT if CP now or developed within 24 hours  1. Are you having CP right now? Yes  2. Are you experiencing any other symptoms (ex. SOB, nausea, vomiting, sweating)? SOB  3. How long have you been experiencing CP? Since yesterday evening, 08/01/19  4. Is your CP continuous or coming and going? Coming and going 5. Have you taken Nitroglycerin? Yes, 1  ?

## 2019-08-02 NOTE — ED Provider Notes (Signed)
[facility] EMERGENCY DEPARTMENT Provider Note   CSN: [id number] Arrival date & time: 08/02/19  1244     History Chief Complaint  Patient presents with  . Chest Pain    Jose Warner is a 69 y.o. male.  HPI HPI Comments: Jose Warner is a 69 y.o. male with history of atrial fibrillation, CAD, bronchitis, GERD who presents to the Emergency Department complaining of 2 weeks of intermittent chest pain.  Patient states he was initially feeling symptoms of a sinus infection and began feeling intermittent, central, heavy chest pain.  He began taking cefdinir for sinus infection which he states initially provided some mild relief.  He finished cefdinir 3 days ago further noting he started prednisone 1 week ago.  He states yesterday he was feeling "very good" and last night around 9:30 PM he had an acute worsening of chest pain.  He describes it as heavy and aching.  It was constant at the time but is now waxing and waning and is 5/10.  He took 1 nitroglycerin last night which alleviated his symptoms mildly for about 10 minutes.  2 years ago patient states he had similar symptoms which were worse at the time and had a normal ECG and cardiac enzymes.  He was admitted and had a cardiac catheterization showing a 95% occlusion of the LAD.  He has a stent in place.  Patient also complains of 3 weeks of sore throat, left-sided face pain, two intermittent sharp pains in the left tricep which occurred since arriving, intermittent diffuse posterior neck pain, epigastric pain.  He denies fevers, chills, nausea, vomiting, diarrhea, urinary changes, syncope, dizziness.     Past Medical History:  Diagnosis Date  . Anemia   . Bronchitis 05-10-12   past fall- 4 runs antibiotics due to bronchitis-uses [employer] as needed  . Celiac disease   . Colon polyps    adenomatous  . Coronary artery disease   . Diverticulosis   . GERD (gastroesophageal reflux disease)   . Hepatitis 05-10-12   "was told non A, non B"-unclean dental equip.  Marland Kitchen Hyperlipidemia   . Iron deficiency anemia   . Pericarditis   . Persistent atrial fibrillation ([place]) 06/05/2017  . Prostate cancer ([place]) 05-10-12   ;bx. 04-12-12, dx  . Spasm of esophagus 2013    Patient Active Problem List   Diagnosis Date Noted  . Secondary hypercoagulable state ([place]) 05/19/2019  . Persistent atrial fibrillation ([place]) 01/04/2019  . Chronic anticoagulation 08/17/2017  . Asthma 08/17/2017  . Unstable angina ([place]) 08/17/2017  . ACS (acute coronary syndrome) ([place]) 06/14/2017  . GERD (gastroesophageal reflux disease) 06/10/2017  . Chest pain 06/09/2017  . 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 FHx: coronary artery disease 11/26/2012  . H/O prostate cancer 11/26/2012  . Low HDL (under 40) 11/26/2012  . Paroxysmal atrial fibrillation ([place]) 11/26/2012  . CAD S/P percutaneous coronary angioplasty 11/26/2012    Past Surgical History:  Procedure Laterality Date  . CARDIAC CATHETERIZATION  09/13/2004   LAD:30%-40% in prox. to mid segment with 20% in the mid segment and 20% in left Circ., mild 20% right common iliac narrowing. medical therapy  . CARDIOVERSION  06/05/2017  . CARDIOVERSION N/A 11/11/2018   Procedure: CARDIOVERSION;  Surgeon: Pixie Casino, MD;  Location: [facility] ENDOSCOPY;  Service: Cardiovascular;  Laterality: N/A;  . CORONARY STENT INTERVENTION N/A 06/11/2017   Procedure: CORONARY STENT INTERVENTION;  Surgeon: Belva Crome, MD;  Location: [facility] CV LAB;  Service: Cardiovascular;  Laterality: N/A;  . HERNIA REPAIR  5 yrs ago   right   . LEFT HEART CATH AND CORONARY ANGIOGRAPHY N/A 06/11/2017   Procedure: LEFT HEART CATH AND CORONARY ANGIOGRAPHY;  Surgeon: Belva Crome, MD;  Location: [facility] CV LAB;  Service: Cardiovascular;  Laterality: N/A;  . LEFT HEART CATH AND CORONARY ANGIOGRAPHY N/A 08/18/2017   Procedure: LEFT HEART CATH AND CORONARY ANGIOGRAPHY;  Surgeon: Martinique,  Peter M, MD;  Location: [facility] CV LAB;  Service: Cardiovascular;  Laterality: N/A;  . NM MYOCAR PERF WALL MOTION  11/30/2007   protocol:Bruce, post EF 67%,mild perfusion defect seen in basal inferior consistant with Attenuation artifact, exercise cap. 12METS  . ROBOT ASSISTED LAPAROSCOPIC RADICAL PROSTATECTOMY  05/20/2012   Procedure: ROBOTIC ASSISTED LAPAROSCOPIC RADICAL PROSTATECTOMY LEVEL 1;  Surgeon: Dutch Gray, MD;  Location: [facility] ORS;  Service: Urology;  Laterality: N/A;     . TRANSURETHRAL RESECTION OF PROSTATE  31yr ago       Family History  Problem Relation Age of Onset  . Stroke Mother   . Heart disease Father   . Heart attack Father   . Cancer Sister   . Stroke Maternal Grandfather   . Cancer Paternal Grandmother   . Heart attack Paternal Grandfather   . Heart disease Paternal Grandfather   . Colon cancer Neg Hx   . Esophageal cancer Neg Hx   . Pancreatic cancer Neg Hx   . Rectal cancer Neg Hx   . Stomach cancer Neg Hx     Social History   Tobacco Use  . Smoking status: Never Smoker  . Smokeless tobacco: Never Used  Substance Use Topics  . Alcohol use: Never    Alcohol/week: 2.0 standard drinks    Types: 2 Glasses of wine per week  . Drug use: No    Home Medications Prior to Admission medications   Medication Sig Start Date End Date Taking? Authorizing Provider  ADVAIR DISKUS 250-50 MCG/DOSE AEPB Inhale 1 puff into the lungs in the morning and at bedtime.  07/16/18  Yes [provider]  amLODipine (NORVASC) 2.5 MG tablet Take 1 tablet (2.5 mg total) by mouth daily. Patient taking differently: Take 2.5 mg by mouth every evening.  08/31/17 11/08/19 Yes KTroy Sine MD  aspirin EC 81 MG tablet Take 1 tablet (81 mg total) by mouth daily. 01/19/18  Yes KTroy Sine MD  b complex vitamins tablet Take 1 tablet by mouth daily with supper.   Yes [provider]  bisoprolol (ZEBETA) 5 MG tablet Take 2.5 mg by mouth daily.    Yes [provider]  Cholecalciferol 50 MCG (2000 UT) CAPS Take 2,000 Units by mouth daily.    Yes [provider]  dofetilide (TIKOSYN) 500 MCG capsule Take 1 capsule (500 mcg total) by mouth 2 (two) times daily. 01/13/19  Yes Fenton, Clint R, PA  ezetimibe (ZETIA) 10 MG tablet Take 10 mg by mouth every evening.    Yes [provider]  fluocinonide cream (LIDEX) 08.34% Apply 1 application topically 2 (two) times daily.  05/30/19  Yes [provider]  fluticasone (FLONASE) 50 MCG/ACT nasal spray Place 1 spray into both nostrils daily.   Yes [provider]  guaiFENesin (MUCINEX) 600 MG 12 hr tablet Take 600 mg by mouth daily.   Yes [provider]  Homeopathic Products ([facility] Hospital[facility]OLD REM[facility]Y PO) Take 1 tablet by mouth in the morning and at bedtime.  Yes [provider]  Multiple Vitamin (MULTIVITAMIN WITH MINERALS) TABS tablet Take 1 tablet by mouth daily with supper.   Yes [provider]  niacin (NIASPAN) 1000 MG CR tablet Take 1,000 mg by mouth at bedtime.  12/04/15  Yes [provider]  nitroGLYCERIN (NITROSTAT) 0.4 MG SL tablet Place 1 tablet (0.4 mg total) under the tongue every 5 (five) minutes x 3 doses as needed for chest pain. 06/23/18  Yes Troy Sine, MD  pantoprazole (PROTONIX) 40 MG tablet Take 40 mg by mouth 2 (two) times daily.  06/24/17  Yes [provider]  pravastatin (PRAVACHOL) 80 MG tablet Take 80 mg by mouth every evening.    Yes [provider]  PROAIR HFA 108 (90 BASE) MCG/ACT inhaler Take 2 puffs by mouth every 4 (four) hours as needed for wheezing or shortness of breath. For shortness of breath. 02/13/12  Yes [provider]  rivaroxaban (XARELTO) 20 MG TABS tablet Take 1 tablet (20 mg total) by mouth daily with supper. 05/19/19  Yes Fenton, Clint R, PA  Vitamin D, Ergocalciferol, 2000 units CAPS Take 2,000 Units by mouth at bedtime.    Yes [provider]    Allergies      Gluten meal, Wheat bran, Statins, and Sulfa antibiotics  Review of Systems   Review of Systems  All other systems reviewed and are negative. Ten systems reviewed and are negative for acute change, except as noted in the HPI.   Physical Exam Updated Vital Signs BP 123/68   Pulse (!) 48   Temp 98.6 F (37 C) (Oral)   Resp 16   SpO2 97%   Physical Exam Vitals and nursing note reviewed.  Constitutional:      General: He is not in acute distress.    Appearance: He is well-developed and normal weight. He is not ill-appearing, toxic-appearing or diaphoretic.     Comments: Patient is a pleasant well-developed elderly male.  He speaks clearly and coherently.  HENT:     Head: Normocephalic and atraumatic.  Eyes:     Extraocular Movements: Extraocular movements intact.     Pupils: Pupils are equal, round, and reactive to light.  Neck:     Comments: White patches noted in the posterior oropharynx consistent with Candida Cardiovascular:     Rate and Rhythm: Regular rhythm. Bradycardia present.     Pulses:          Radial pulses are 2+ on the right side and 2+ on the left side.       Dorsalis pedis pulses are 2+ on the right side and 2+ on the left side.     Heart sounds: Normal heart sounds. No murmur.     Comments: No pain appreciated with palpation of the anterior chest wall Pulmonary:     Effort: Pulmonary effort is normal. No tachypnea, accessory muscle usage or respiratory distress.     Breath sounds: Normal breath sounds. No stridor. No decreased breath sounds, wheezing, rhonchi or rales.  Abdominal:     Palpations: Abdomen is soft.     Tenderness: There is abdominal tenderness (Moderate epigastric tenderness with deep palpation).  Musculoskeletal:        General: Normal range of motion.     Cervical back: Normal range of motion and neck supple.     Right lower leg: No tenderness. No edema.     Left lower leg: No tenderness. No edema.  Skin:    General: Skin is warm  and dry.      Capillary Refill: Capillary refill takes less than 2 seconds.  Neurological:     General: No focal deficit present.     Mental Status: He is alert and oriented to person, place, and time.  Psychiatric:        Mood and Affect: Mood normal.        Behavior: Behavior normal.    ED Results / Procedures / Treatments   Labs (all labs ordered are listed, but only abnormal results are displayed) Labs Reviewed  BASIC METABOLIC PANEL - Abnormal; Notable for the following components:      Result Value   CO2 21 (*)    Glucose, Bld 114 (*)    All other components within normal limits  CBC  TROPONIN I (HIGH SENSITIVITY)  TROPONIN I (HIGH SENSITIVITY)    EKG EKG Interpretation  Date/Time:  Tuesday August 02 2019 12:49:07 EDT Ventricular Rate:  65 PR Interval:  144 QRS Duration: 84 QT Interval:  480 QTC Calculation: 499 R Axis:   18 Text Interpretation: Sinus rhythm with Premature supraventricular complexes and with occasional Premature ventricular complexes Prolonged QT Abnormal ECG similar to earlier in the day and Jan 2021 Confirmed by Sherwood Gambler [id number]) on 08/02/2019 2:55:19 PM  Radiology DG Chest 2 View  Result Date: 08/02/2019 CLINICAL DATA:  Shortness of breath. EXAM: CHEST - 2 VIEW COMPARISON:  12/29/2018 FINDINGS: Normal heart size. Density anterior to the heart on the lateral view is from a fat pad by 2020 chest CT. Normal aortic contours. There is no edema, consolidation, effusion, or pneumothorax. IMPRESSION: No evidence of active disease. Electronically Signed   By: Monte Fantasia M.D.   On: 08/02/2019 10:14   Procedures Procedures   Medications Ordered in ED Medications  nystatin (MYCOSTATIN) 100000 UNIT/ML suspension 200,000 Units (has no administration in time range)  sodium chloride flush (NS) 0.9 % injection 3 mL (3 mLs Intravenous Given 08/02/19 1732)  nitroGLYCERIN (NITROSTAT) SL tablet 0.4 mg (0.4 mg Sublingual Given 08/02/19 1634)  aspirin chewable tablet  324 mg (324 mg Oral Given 08/02/19 1729)  alum & mag hydroxide-simeth (MAALOX/MYLANTA) 200-200-20 MG/5ML suspension 30 mL (30 mLs Oral Given 08/02/19 1916)    And  lidocaine (XYLOCAINE) 2 % viscous mouth solution 15 mL (15 mLs Oral Given 08/02/19 1916)  dofetilide (TIKOSYN) capsule 500 mcg (500 mcg Oral Given 08/02/19 2003)   ED Course  I have reviewed the triage vital signs and the nursing notes.  Pertinent labs & imaging results that were available during my care of the patient were reviewed by me and considered in my medical decision making (see chart for details).    MDM Rules/Calculators/A&P                      4:36 PM patient is a 69 year old male with a lengthy cardiac history who is followed by his cardiologist Dr. Claiborne Billings.  He was being evaluated by his primary care provider Dr. Sandi Mariscal and was sent to the emergency department for ACS rule out.  Chest x-ray is negative.  ECG is reassuring.  Initial labs are also reassuring.  Initial troponin is 5.  He took a dose of nitroglycerin last night with mild relief of his chest pain.  Will give patient a dose of sublingual nitroglycerin and will reevaluate.  Patient discussed with and evaluated by my attending physician Dr. Quintella Reichert.  We will additionally give patient aspirin and will consult cardiology.  5:04 PM  I spoke to Dr. Harrell Gave with cardiology who has evaluated patient's labs and ECG.  She feels comfortable that Mr. Dipaolo's symptoms are not ACS.  She states with his most recent catheterization findings in 2019 and a stent in place that if this was ACS we would see an increase in troponin or findings on ECG.  She states she will send a note to his primary care provider as well as his cardiologist regarding our discussion.  I will reevaluate the patient.  6:43 PM I spoke to the patient and he states that for the past hour he started to feel better.  He is anxious about his symptoms.  I discussed giving him a GI cocktail which he is  amenable with.  His primary care provider has already called in a prescription for nystatin.  We will also give him a dose of nystatin tonight in the emergency department.  Patient is concerned that he will miss his dose of 500 mcg Tikosyn at 8 PM.  We will additionally place an order for this.  Will reevaluate once again after his GI cocktail.  7:54 PM patient states he is feeling significantly better since receiving the GI cocktail.  His pain is 2/10.  Nursing staff to give him his Tikosyn and a dose of nystatin tonight.  Will discharge patient with strict return precautions.  He understands to return the emergency department any new or worsening symptoms.  Patient and his wife state they are going to follow-up with his primary care provider and cardiologist tomorrow regarding this visit.  His questions were answered and he was amicable with the above plan.  Vital signs stable.  Patient discharged to home/self care.  Condition at discharge: Stable  Note: Portions of this report may have been transcribed using voice recognition software. Every effort was made to ensure accuracy; however, inadvertent computerized transcription errors may be present.    Final Clinical Impression(s) / ED Diagnoses Final diagnoses:  Chest pain, unspecified type  Candida esophagitis [facility])    Rx / DC Orders ED Discharge Orders    None       Moody Bruins 08/02/19 2011    Quintella Reichert, MD 08/02/19 [id number]

## 2019-08-02 NOTE — ED Triage Notes (Signed)
Pt arrives from PCP to rule out coronary syndrome for chest that began 2 weeks ago which has been intermitted but worse today.  2 years ago but states he had 95% blockage in LAD and stent placed.

## 2019-08-02 NOTE — Telephone Encounter (Signed)
Patient is presently in ED for chest pain eval. He wanted Dr. Claiborne Billings to be aware  Message routed

## 2019-08-02 NOTE — Discharge Instructions (Addendum)
Per discussion, please make sure to follow-up with your primary care provider regarding this visit as soon as possible.  Please also make sure to follow-up with your cardiologist regarding this visit.  If you have any new or worsening symptoms do not hesitate to return to the emergency department for reevaluation.

## 2019-08-03 NOTE — Telephone Encounter (Signed)
Thank you for letting me know

## 2019-08-17 NOTE — Progress Notes (Signed)
Primary Care Physician: Derinda Late, MD Referring Physician: [facility] F/u Primary Cardiologist: [name] Primary EP: Dr Becky Sax Jose Warner is a 69 y.o. male with a h/o persistent afib, HLD, CAD, and prostate cancer who presents to the AF Clinic for follow up. Patient loaded on dofetilide 9/15-9/18/20. He converted to SR on the medication and did not require DCCV. He is on Xarelto for a CHADS2VASC score of 2.  On follow up today, patient was seen in the ER on 08/02/19 for acute CP. He was diagnosed with esophageal candidiasis (he had recently been on steroids and abx for a sinus infection). His CP resolved with GI cocktail. He denies any heart racing or palpitations. He also denies any bleeding issues on anticoagulation.   Today, he denies symptoms of palpitations, SOB, orthopnea, PND, lower extremity edema, presyncope, syncope, or neurologic sequela. The patient is tolerating medications without difficulties and is otherwise without complaint today.   Past Medical History:  Diagnosis Date  . Anemia   . Bronchitis 05-10-12   past fall- 4 runs antibiotics due to bronchitis-uses [employer] as needed  . Celiac disease   . Colon polyps    adenomatous  . Coronary artery disease   . Diverticulosis   . GERD (gastroesophageal reflux disease)   . Hepatitis 05-10-12   "was told non A, non B"-unclean dental equip.  Marland Kitchen Hyperlipidemia   . Iron deficiency anemia   . Pericarditis   . Persistent atrial fibrillation ([place]) 06/05/2017  . Prostate cancer ([place]) 05-10-12   ;bx. 04-12-12, dx  . Spasm of esophagus 2013   Past Surgical History:  Procedure Laterality Date  . CARDIAC CATHETERIZATION  09/13/2004   LAD:30%-40% in prox. to mid segment with 20% in the mid segment and 20% in left Circ., mild 20% right common iliac narrowing. medical therapy  . CARDIOVERSION  06/05/2017  . CARDIOVERSION N/A 11/11/2018   Procedure: CARDIOVERSION;  Surgeon: Pixie Casino, MD;  Location: [facility]  ENDOSCOPY;  Service: Cardiovascular;  Laterality: N/A;  . CORONARY STENT INTERVENTION N/A 06/11/2017   Procedure: CORONARY STENT INTERVENTION;  Surgeon: Belva Crome, MD;  Location: [place] [facility];  Service: Cardiovascular;  Laterality: N/A;  . HERNIA REPAIR  5 yrs ago   right   . LEFT HEART CATH AND CORONARY ANGIOGRAPHY N/A 06/11/2017   Procedure: LEFT HEART CATH AND CORONARY ANGIOGRAPHY;  Surgeon: Belva Crome, MD;  Location: [place] [facility];  Service: Cardiovascular;  Laterality: N/A;  . LEFT HEART CATH AND CORONARY ANGIOGRAPHY N/A 08/18/2017   Procedure: LEFT HEART CATH AND CORONARY ANGIOGRAPHY;  Surgeon: Martinique, Peter M, MD;  Location: [place] [facility];  Service: Cardiovascular;  Laterality: N/A;  . NM MYOCAR PERF WALL MOTION  11/30/2007   protocol:Bruce, post EF 67%,mild perfusion defect seen in basal inferior consistant with Attenuation artifact, exercise cap. 12METS  . ROBOT ASSISTED LAPAROSCOPIC RADICAL PROSTATECTOMY  05/20/2012   Procedure: ROBOTIC ASSISTED LAPAROSCOPIC RADICAL PROSTATECTOMY LEVEL 1;  Surgeon: Dutch Gray, MD;  Location: [facility];  Service: Urology;  Laterality: N/A;     . TRANSURETHRAL RESECTION OF PROSTATE  27yr ago    Current Outpatient Medications  Medication Sig Dispense Refill  . ADVAIR DISKUS 250-50 MCG/DOSE AEPB Inhale 1 puff into the lungs in the morning and at bedtime.     .Marland KitchenamLODipine (NORVASC) 2.5 MG tablet Take 1 tablet (2.5 mg total) by mouth daily. (Patient taking differently: Take 2.5 mg by mouth every evening. )  90 tablet 3  . aspirin EC 81 MG tablet Take 1 tablet (81 mg total) by mouth daily. 90 tablet 3  . b complex vitamins tablet Take 1 tablet by mouth daily with supper.    . bisoprolol (ZEBETA) 5 MG tablet Take 2.5 mg by mouth daily.     . Cholecalciferol 50 MCG (2000 UT) CAPS Take 2,000 Units by mouth daily.     Marland Kitchen dofetilide (TIKOSYN) 500 MCG capsule Take 1 capsule (500 mcg total) by mouth 2 (two) times daily. 180 capsule 2  .  ezetimibe (ZETIA) 10 MG tablet Take 10 mg by mouth every evening.     . fluocinonide cream (LIDEX) 9.70 % Apply 1 application topically 2 (two) times daily.     . fluticasone (FLONASE) 50 MCG/ACT nasal spray Place 1 spray into both nostrils daily.    Marland Kitchen guaiFENesin (MUCINEX) 600 MG 12 hr tablet Take 600 mg by mouth daily.    . Homeopathic Products (ZICAM COLD REMEDY PO) Take 1 tablet by mouth in the morning and at bedtime.    . Multiple Vitamin (MULTIVITAMIN WITH MINERALS) TABS tablet Take 1 tablet by mouth daily with supper.    . niacin (NIASPAN) 1000 MG CR tablet Take 1,000 mg by mouth at bedtime.     . nitroGLYCERIN (NITROSTAT) 0.4 MG SL tablet Place 1 tablet (0.4 mg total) under the tongue every 5 (five) minutes x 3 doses as needed for chest pain. 30 tablet 0  . pantoprazole (PROTONIX) 40 MG tablet Take 40 mg by mouth 2 (two) times daily.     . pravastatin (PRAVACHOL) 80 MG tablet Take 80 mg by mouth every evening.     Marland Kitchen PROAIR HFA 108 (90 BASE) MCG/ACT inhaler Take 2 puffs by mouth every 4 (four) hours as needed for wheezing or shortness of breath. For shortness of breath.    . rivaroxaban (XARELTO) 20 MG TABS tablet Take 1 tablet (20 mg total) by mouth daily with supper. 90 tablet 2  . Vitamin D, Ergocalciferol, 2000 units CAPS Take 2,000 Units by mouth at bedtime.      Current Facility-Administered Medications  Medication Dose Route Frequency Provider Last Rate Last Admin  . 0.9 %  sodium chloride infusion  500 mL Intravenous Once Irene Shipper, MD        Allergies  Allergen Reactions  . Gluten Meal Other (See Comments)    Celiac, stomach crams , gas  . Wheat Bran Other (See Comments)    Celiac   . Statins     Leg muscle pains - rosuvastatin, atorvastatin, zetia. Tolerates simvastatin  . Sulfa Antibiotics Rash    Social History   Socioeconomic History  . Marital status: Married    Spouse name: Not on file  . Number of children: 3  . Years of education: Not on file  .  Highest education level: Not on file  Occupational History    Employer: [employer]  Tobacco Use  . Smoking status: Never Smoker  . Smokeless tobacco: Never Used  Substance and Sexual Activity  . Alcohol use: Never    Alcohol/week: 2.0 standard drinks    Types: 2 Glasses of wine per week  . Drug use: No  . Sexual activity: Yes    Partners: Female  Other Topics Concern  . Not on file  Social History Narrative  . Not on file   Social Determinants of Health   Financial Resource Strain:   . Difficulty of Paying Living Expenses:  Food Insecurity:   . Worried About Charity fundraiser in the Last Year:   . Arboriculturist in the Last Year:   Transportation Needs:   . Film/video editor (Medical):   Marland Kitchen Lack of Transportation (Non-Medical):   Physical Activity:   . Days of Exercise per Week:   . Minutes of Exercise per Session:   Stress:   . Feeling of Stress :   Social Connections:   . Frequency of Communication with Friends and Family:   . Frequency of Social Gatherings with Friends and Family:   . Attends Religious Services:   . Active Member of Clubs or Organizations:   . Attends Archivist Meetings:   Marland Kitchen Marital Status:   Intimate Partner Violence:   . Fear of Current or Ex-Partner:   . Emotionally Abused:   Marland Kitchen Physically Abused:   . Sexually Abused:     Family History  Problem Relation Age of Onset  . Stroke Mother   . Heart disease Father   . Heart attack Father   . Cancer Sister   . Stroke Maternal Grandfather   . Cancer Paternal Grandmother   . Heart attack Paternal Grandfather   . Heart disease Paternal Grandfather   . Colon cancer Neg Hx   . Esophageal cancer Neg Hx   . Pancreatic cancer Neg Hx   . Rectal cancer Neg Hx   . Stomach cancer Neg Hx     ROS- All systems are reviewed and negative except as per the HPI above  Physical Exam: Vitals:   08/18/19 0841  BP: (!) 110/58  Pulse: (!) 57  Weight: 81.9 kg  Height: 5' 8"  (1.727 m)    Wt Readings from Last 3 Encounters:  08/18/19 81.9 kg  05/19/19 82.1 kg  02/15/19 83.1 kg    Labs: Lab Results  Component Value Date   NA 139 08/02/2019   K 4.4 08/02/2019   CL 107 08/02/2019   CO2 21 (L) 08/02/2019   GLUCOSE 114 (H) 08/02/2019   BUN 13 08/02/2019   CREATININE 1.04 08/02/2019   CALCIUM 9.1 08/02/2019   PHOS 3.7 12/28/2017   MG 2.2 01/13/2019   Lab Results  Component Value Date   INR 1.18 06/11/2017   Lab Results  Component Value Date   CHOL 122 06/10/2017   HDL 41 06/10/2017   LDLCALC 60 06/10/2017   TRIG 103 06/10/2017    GEN- The patient is well appearing, alert and oriented x 3 today.   HEENT-head normocephalic, atraumatic, sclera clear, conjunctiva pink, hearing intact, trachea midline. Lungs- Clear to ausculation bilaterally, normal work of breathing Heart- Regular rate and rhythm, no murmurs, rubs or gallops  GI- soft, NT, ND, + BS Extremities- no clubbing, cyanosis, or edema MS- no significant deformity or atrophy Skin- no rash or lesion Psych- euthymic mood, full affect Neuro- strength and sensation are intact   EKG- SB HR 57, PR 176, QRS 88, QTc 445  Epic records reviewed  Cardiac CT- 9/10-IMPRESSION: 1. Normal left ventricular size, thickness and hyperdynamic systolic function (LVEF = 71%). There are no regional wall motion abnormalities. There is no late gadolinium enhancement in the left ventricular myocardium. 2. Normal right ventricular size, thickness and systolic function (LVEF = 73%). There are no regional wall motion abnormalities. 3. Mildly dilated left atrium. There is atypical LGE of the left and right atrial walls suspicious for fibrosis. 4. Normal size of the aortic root, ascending aorta and pulmonary artery. 5.  Mild aortic and mitral and trivial tricuspid regurgitation. 6. No pericardial effusion. There is mild late gadolinium enhancement adjacent to the inferolateral and anterior left ventricular walls and RV  free wall.  Collectively, these findings are consistent with an acute pericarditis. LVEF has improved when compared to the prior echocardiogram on 06/10/2017 from 50-55% to 71%.  Echo 01/07/19 demonstrated  1. Left ventricular ejection fraction, by visual estimation, is 55 to 60%. The left ventricle has normal function. Normal left ventricular size. There is mildly increased left ventricular hypertrophy.  2. Elevated left ventricular end-diastolic pressure.  3. Left ventricular diastolic Doppler parameters are consistent with pseudonormalization pattern of LV diastolic filling.  4. Global right ventricle has normal systolic function.The right ventricular size is normal. No increase in right ventricular wall thickness.  5. Left atrial size was moderately dilated.  6. Right atrial size was normal.  7. The mitral valve is normal in structure. Mild mitral valve regurgitation. No evidence of mitral stenosis.  8. The tricuspid valve is normal in structure. Tricuspid valve regurgitation is mild.  9. The aortic valve is normal in structure. Aortic valve regurgitation is mild to moderate by color flow Doppler. Structurally normal aortic valve, with no evidence of sclerosis or stenosis. 10. The pulmonic valve was normal in structure. Pulmonic valve regurgitation is not visualized by color flow Doppler. 11. The inferior vena cava is normal in size with greater than 50% respiratory variability, suggesting right atrial pressure of 3 mmHg.   Assessment and Plan: 1. Persistent atrial fibrillation S/p successful DCCV 11/11/18 but had recurrent AF.  S/p dofetilide loading 9/15-9/18/20. Patient appears to be maintaining SR. Continue dofetilide 500 mcg BID. QT stable. Check mag today. Recent bmet reviewed.  Continue Xarelto 20 mg daily Continue bisoprolol to 2.5 mg daily. Patient may be interested in ablation if he fails dofetilide.   This patients CHA2DS2-VASc Score and unadjusted Ischemic Stroke Rate (%  per year) is equal to 2.2 % stroke rate/year from a score of 2  Above score calculated as 1 point each if present [CHF, HTN, DM, Vascular=MI/PAD/Aortic Plaque, Age if 65-74, or Male] Above score calculated as 2 points each if present [Age > 75, or Stroke/TIA/TE]  2. CAD DES to LAD 06/11/17. No anginal symptoms.  3. Bradycardia Much improved on lower dose of BB.   Follow up in the AF clinic in 3 months.    [facility] [address] [place], [place] [address] [phone]

## 2019-08-18 ENCOUNTER — Ambulatory Visit (HOSPITAL_COMMUNITY)
Admission: RE | Admit: 2019-08-18 | Discharge: 2019-08-18 | Disposition: A | Discharge status: Home / Self Care | Payer: Medicare Other | Source: Ambulatory Visit | Attending: Physician Assistant | Admitting: Physician Assistant

## 2019-08-18 ENCOUNTER — Encounter (HOSPITAL_COMMUNITY): Payer: Self-pay | Admitting: Physician Assistant

## 2019-08-18 ENCOUNTER — Other Ambulatory Visit: Payer: Self-pay

## 2019-08-18 VITALS — BP 110/58 | HR 57 | Ht 68.0 in | Wt 180.6 lb

## 2019-08-18 DIAGNOSIS — I2510 Atherosclerotic heart disease of native coronary artery without angina pectoris: Secondary | ICD-10-CM | POA: Insufficient documentation

## 2019-08-18 DIAGNOSIS — Z8546 Personal history of malignant neoplasm of prostate: Secondary | ICD-10-CM | POA: Insufficient documentation

## 2019-08-18 DIAGNOSIS — E785 Hyperlipidemia, unspecified: Secondary | ICD-10-CM | POA: Insufficient documentation

## 2019-08-18 DIAGNOSIS — K219 Gastro-esophageal reflux disease without esophagitis: Secondary | ICD-10-CM | POA: Diagnosis not present

## 2019-08-18 DIAGNOSIS — R001 Bradycardia, unspecified: Secondary | ICD-10-CM | POA: Diagnosis not present

## 2019-08-18 DIAGNOSIS — Z888 Allergy status to other drugs, medicaments and biological substances status: Secondary | ICD-10-CM | POA: Insufficient documentation

## 2019-08-18 DIAGNOSIS — Z7901 Long term (current) use of anticoagulants: Secondary | ICD-10-CM | POA: Insufficient documentation

## 2019-08-18 DIAGNOSIS — D6869 Other thrombophilia: Secondary | ICD-10-CM

## 2019-08-18 DIAGNOSIS — K900 Celiac disease: Secondary | ICD-10-CM | POA: Diagnosis not present

## 2019-08-18 DIAGNOSIS — Z7982 Long term (current) use of aspirin: Secondary | ICD-10-CM | POA: Insufficient documentation

## 2019-08-18 DIAGNOSIS — Z882 Allergy status to sulfonamides status: Secondary | ICD-10-CM | POA: Diagnosis not present

## 2019-08-18 DIAGNOSIS — Z79899 Other long term (current) drug therapy: Secondary | ICD-10-CM | POA: Diagnosis not present

## 2019-08-18 DIAGNOSIS — I4819 Other persistent atrial fibrillation: Secondary | ICD-10-CM | POA: Insufficient documentation

## 2019-08-18 DIAGNOSIS — Z8249 Family history of ischemic heart disease and other diseases of the circulatory system: Secondary | ICD-10-CM | POA: Insufficient documentation

## 2019-08-18 LAB — MAGNESIUM: Magnesium: 2.3 mg/dL (ref 1.7–2.4)

## 2019-09-04 IMAGING — DX DG CHEST 1V PORT
1 series · 1 of 1 positions shown · non-contrast
Comparison: Chest radiograph 04/24/2016

CLINICAL DATA: Heart palpitations

EXAM:
PORTABLE CHEST 1 VIEW

[chest ap]
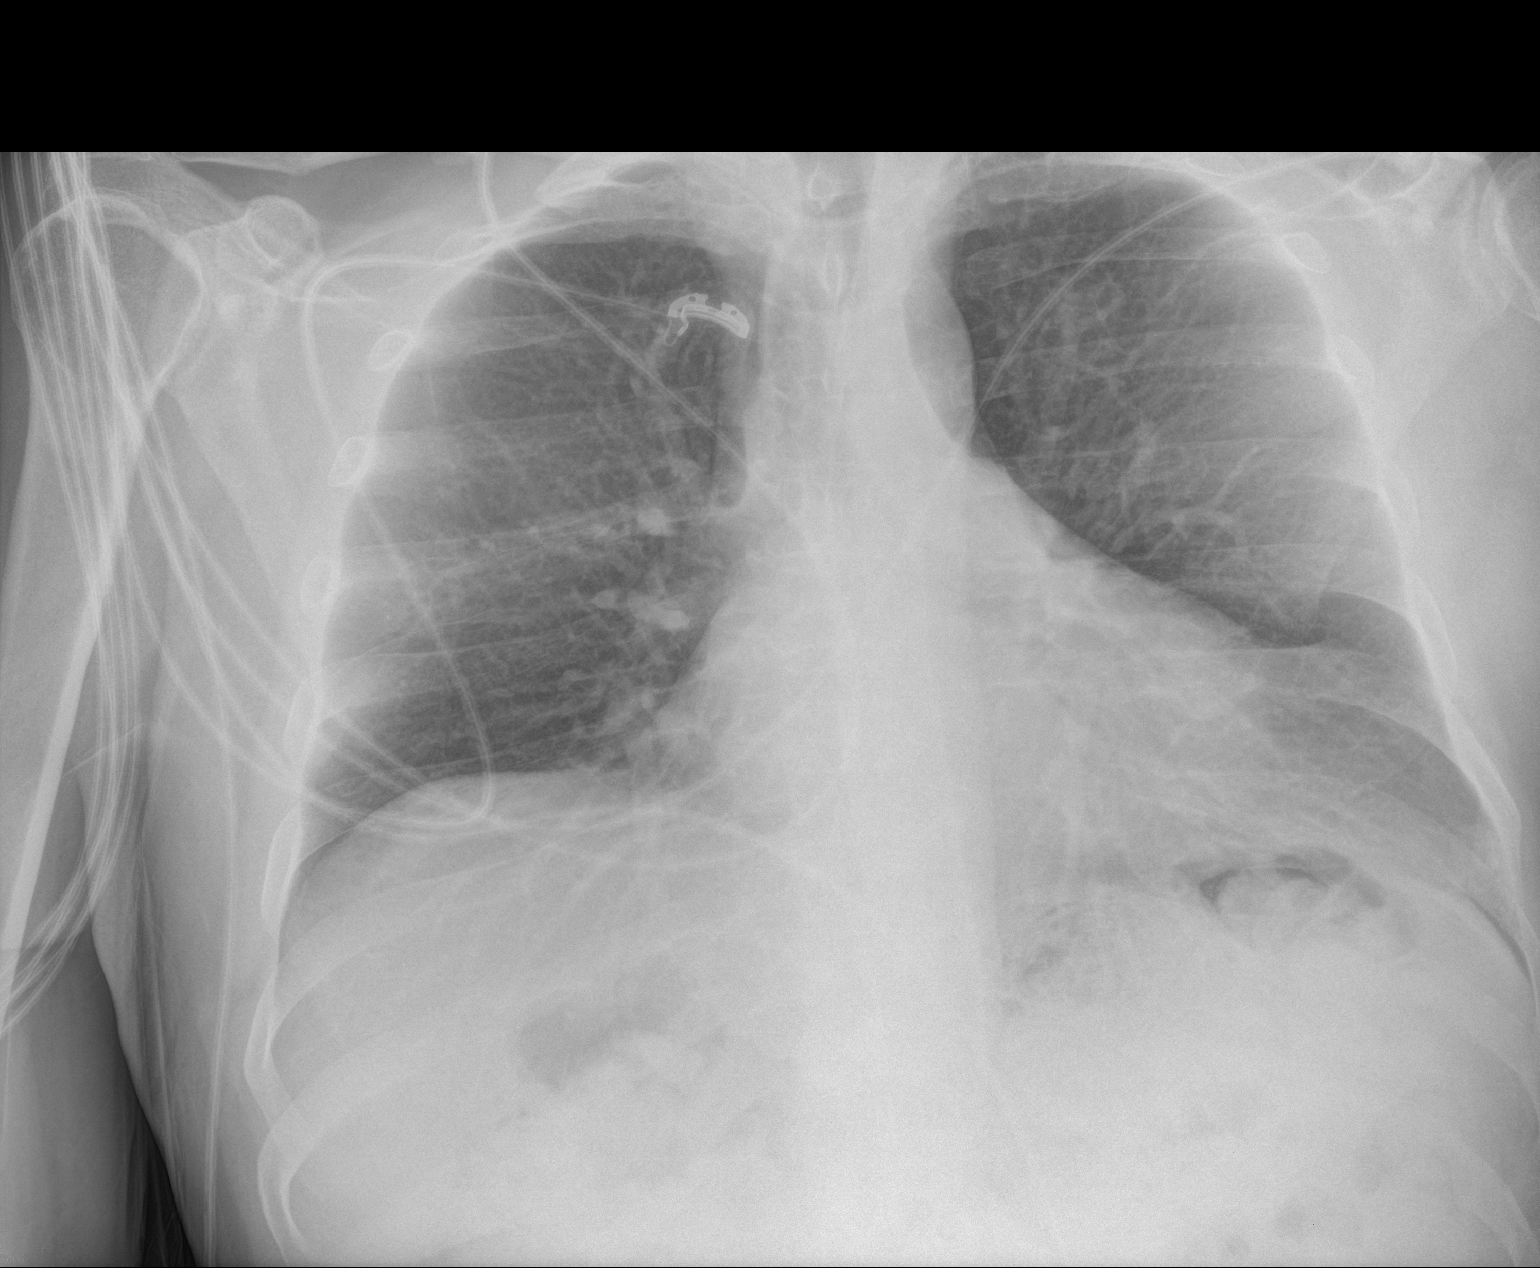

[1 of 1 positions shown; findings below may reference images not displayed]

FINDINGS: The heart size and mediastinal contours are within normal limits.
Both lungs are clear. The visualized skeletal structures are
unremarkable.
IMPRESSION: No active disease.

## 2019-09-11 IMAGING — CR DG CHEST 2V
2 series · 2 of 2 positions shown · non-contrast
Comparison: June 02, 2017

CLINICAL DATA: Chest pressure since 4 p.m. today.

EXAM:
CHEST  2 VIEW

[w chest pa]
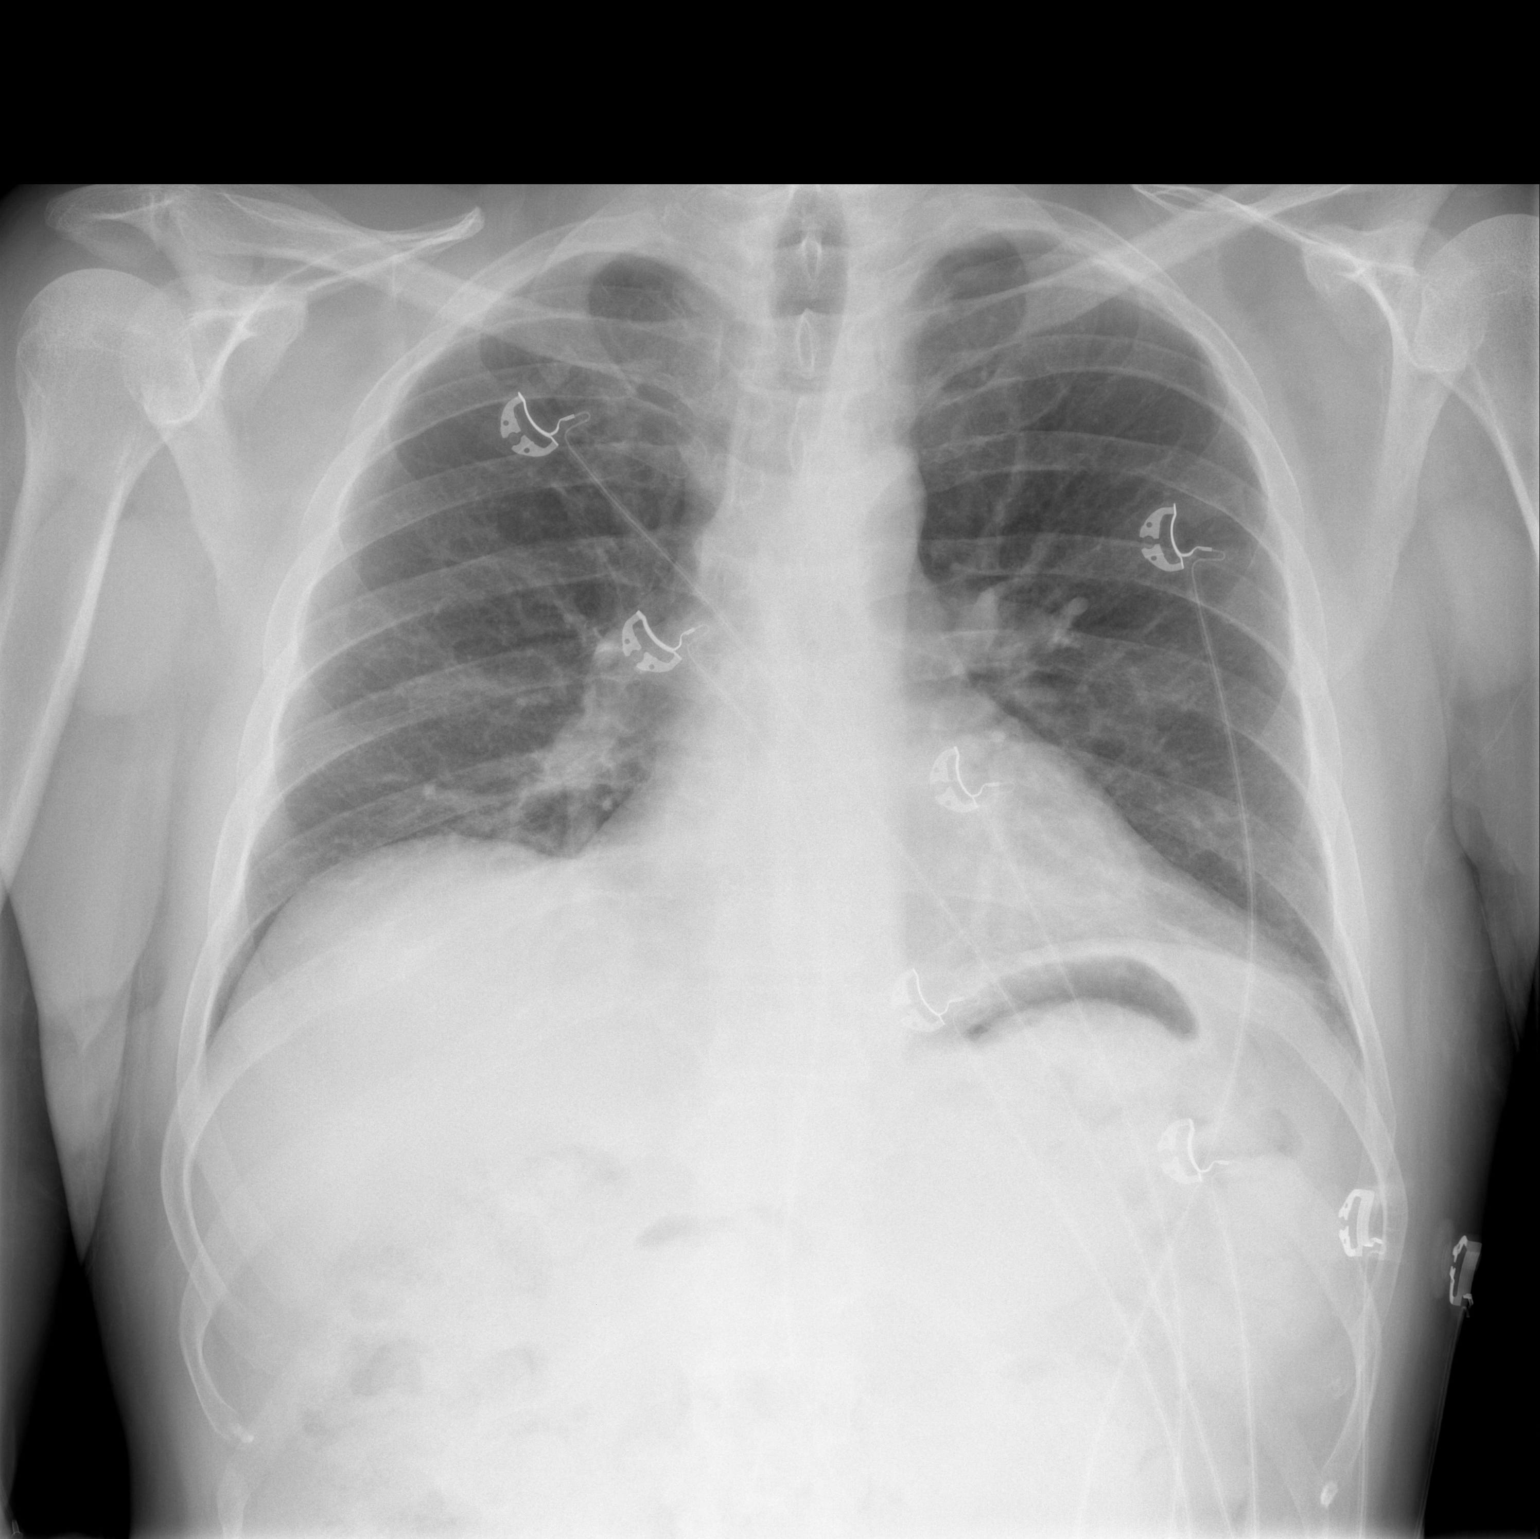

[w chest lat]
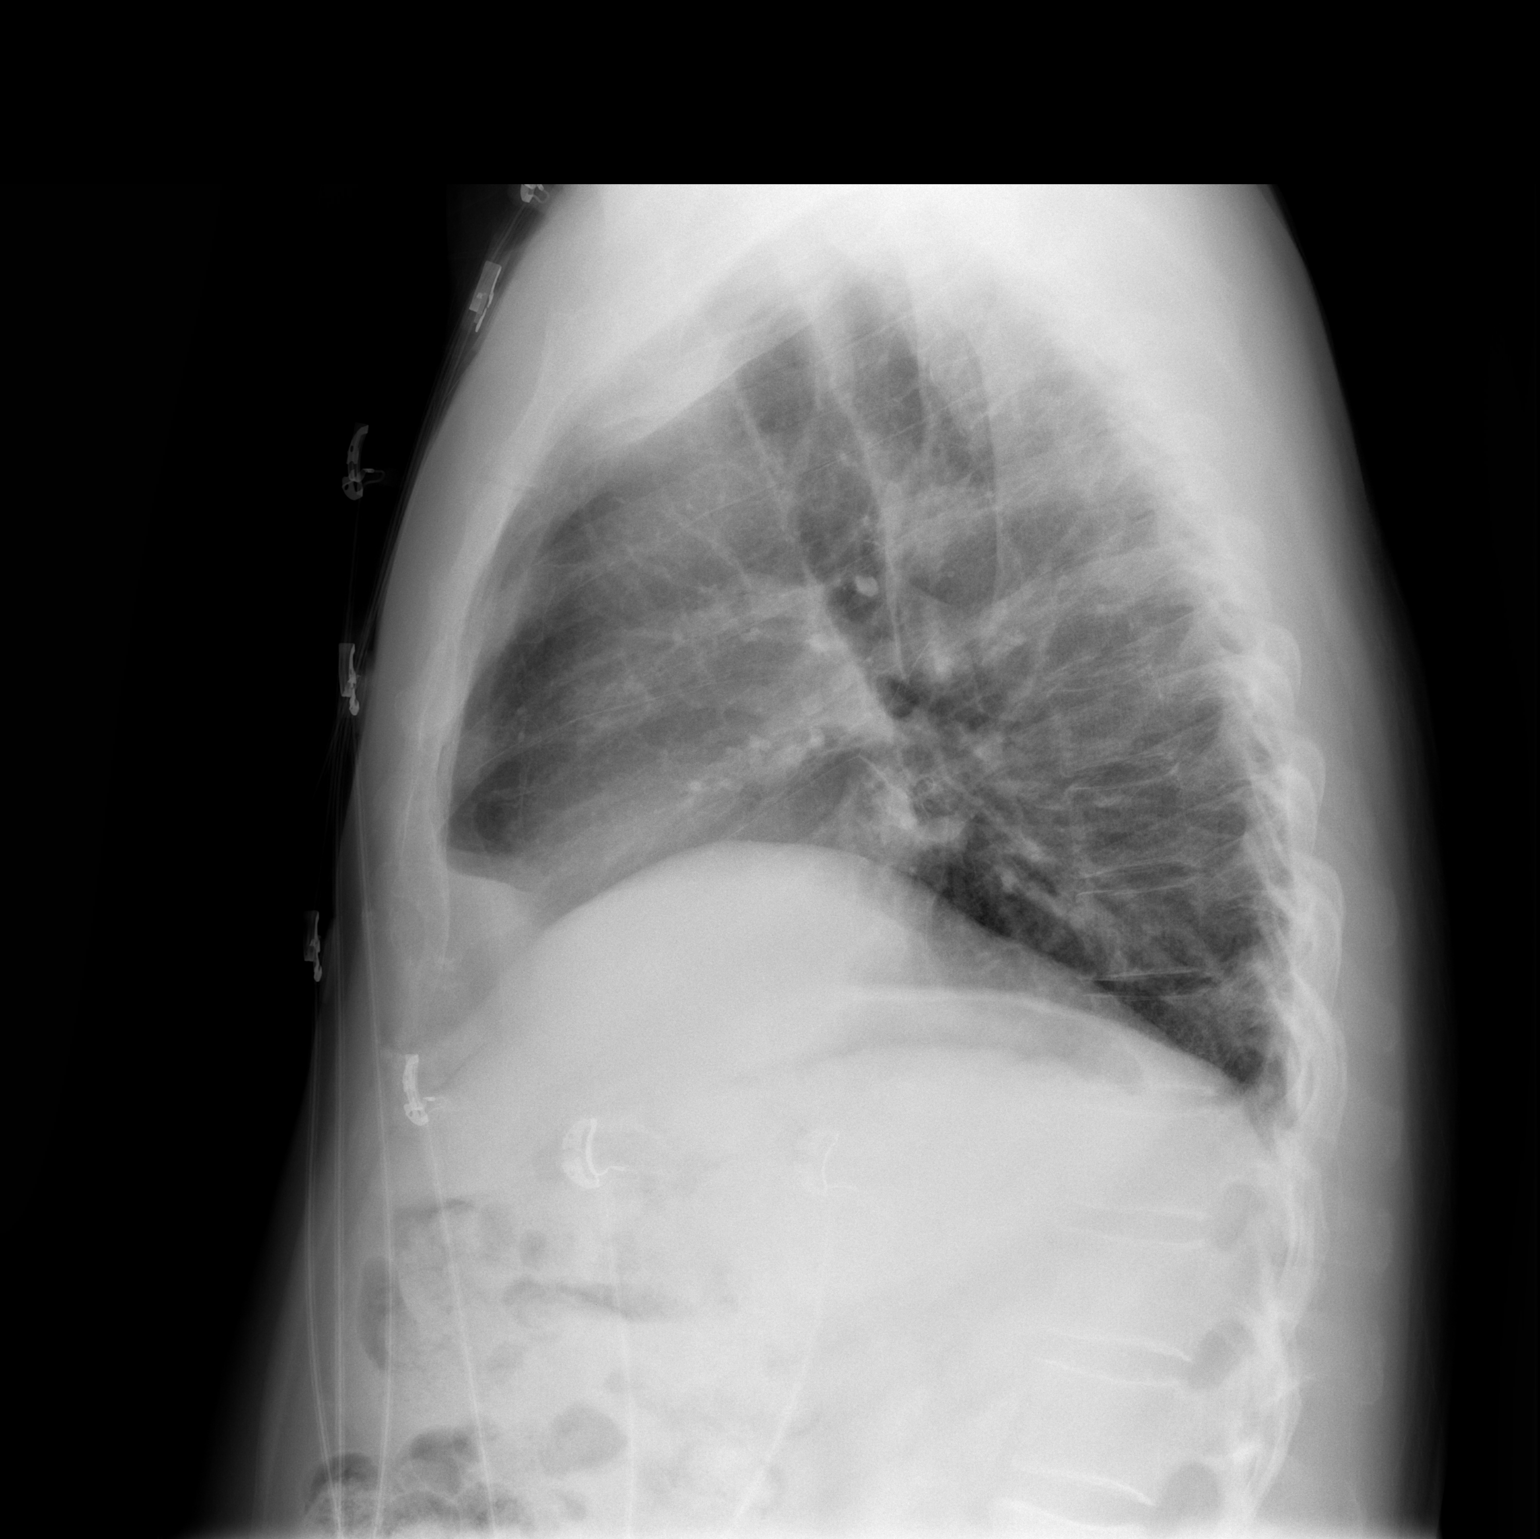

[2 of 2 positions shown; findings below may reference images not displayed]

FINDINGS: The heart size and mediastinal contours are within normal limits.
There is no focal infiltrate, pulmonary edema, or pleural effusion.
There is chronic elevation of the right hemidiaphragm. The
visualized skeletal structures are unremarkable.
IMPRESSION: No active cardiopulmonary disease.

## 2019-10-18 ENCOUNTER — Other Ambulatory Visit: Payer: Self-pay | Admitting: Family Medicine

## 2019-10-18 ENCOUNTER — Ambulatory Visit
Admission: RE | Admit: 2019-10-18 | Discharge: 2019-10-18 | Disposition: A | Discharge status: Home / Self Care | Payer: Medicare Other | Source: Ambulatory Visit | Attending: Family Medicine | Admitting: Family Medicine

## 2019-10-18 ENCOUNTER — Other Ambulatory Visit: Payer: Self-pay

## 2019-10-18 DIAGNOSIS — R29898 Other symptoms and signs involving the musculoskeletal system: Secondary | ICD-10-CM

## 2019-10-18 DIAGNOSIS — R0789 Other chest pain: Secondary | ICD-10-CM

## 2019-10-24 ENCOUNTER — Other Ambulatory Visit (HOSPITAL_COMMUNITY): Payer: Self-pay | Admitting: Physician Assistant

## 2019-11-05 IMAGING — CT CT CHEST W/O CM
1 series · 15 of 34 positions shown, 19 images · non-contrast
Comparison: Chest x-ray dated 06/14/2017 and renal ultrasound dated
04/12/2012

CLINICAL DATA: Atypical chest pain. Coronary stent placed on
06/11/2017.

EXAM:
CT CHEST WITHOUT CONTRAST
TECHNIQUE: Multidetector CT imaging of the chest was performed following the
standard protocol without IV contrast.

[Series 2: chest w/(date) · axial · 0.75mm/px · z∈[-296,-40]mm · 15 of 152 slices shown, 19 images]
[im 12/152  mediastinal]
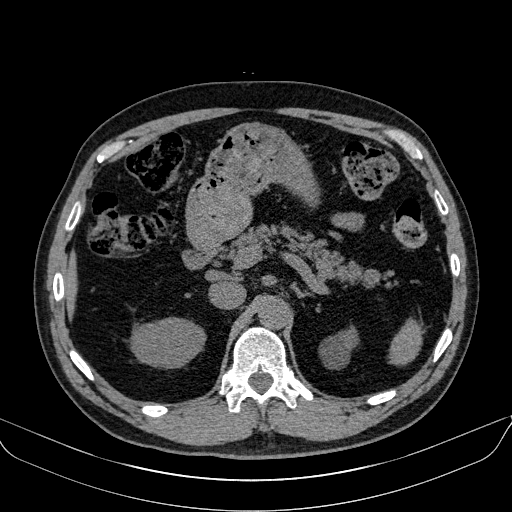
[im 12/152  lung]
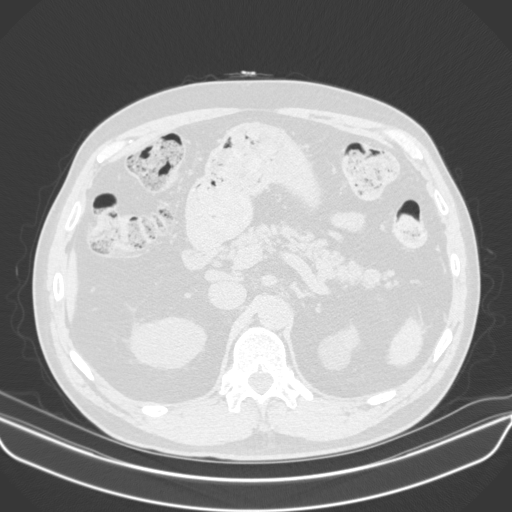
[im 23/152  lung]
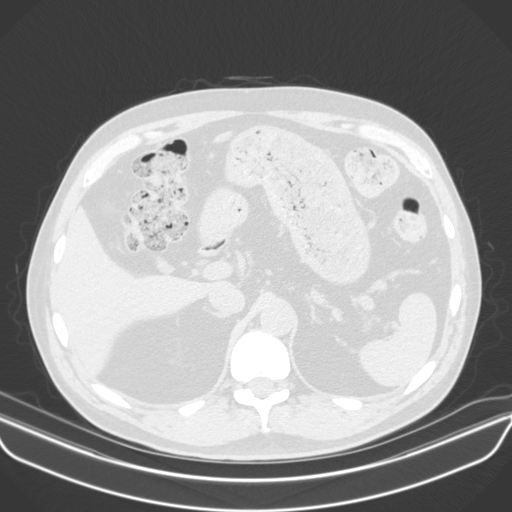
[im 31/152  lung]
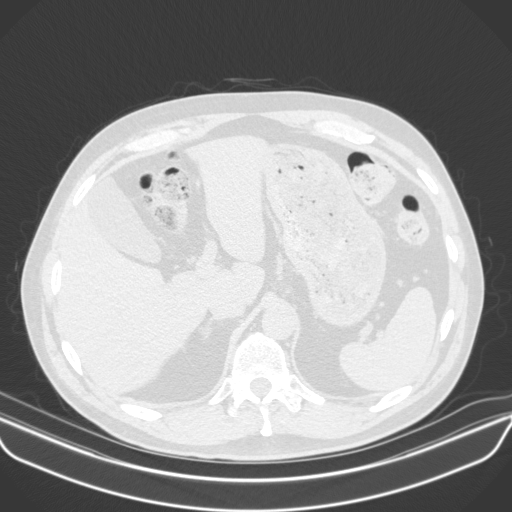
[im 40/152  lung]
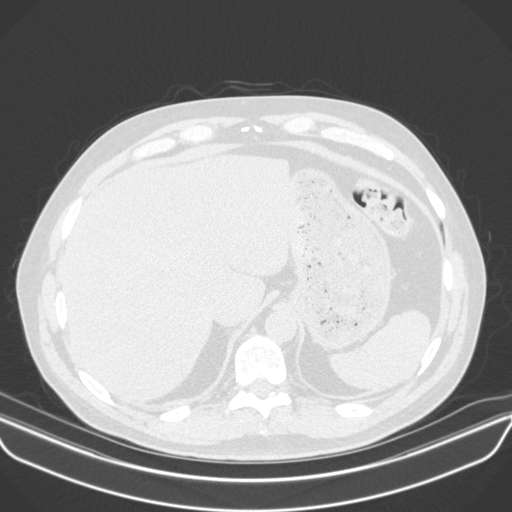
[im 51/152  mediastinal]
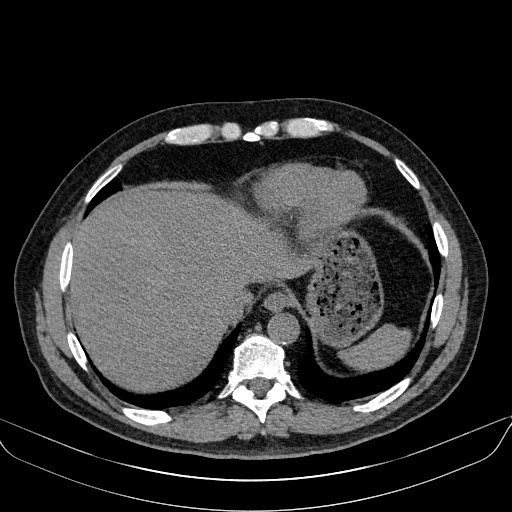
[im 51/152  lung]
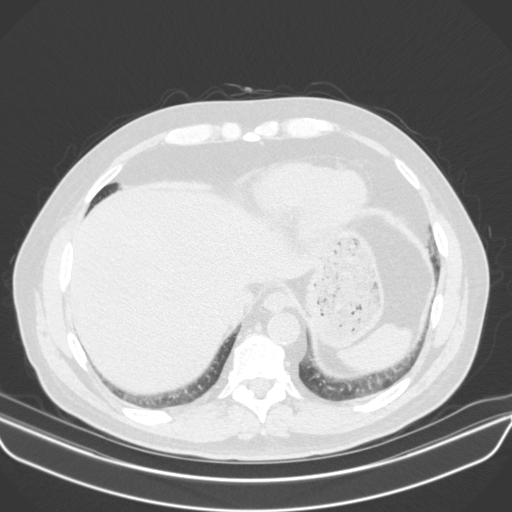
[im 61/152  lung]
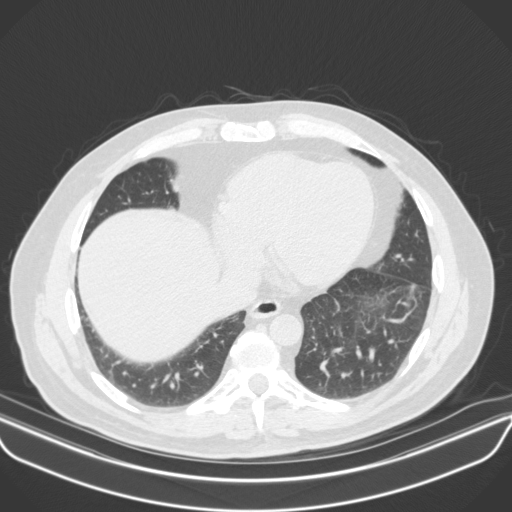
[im 68/152  lung]
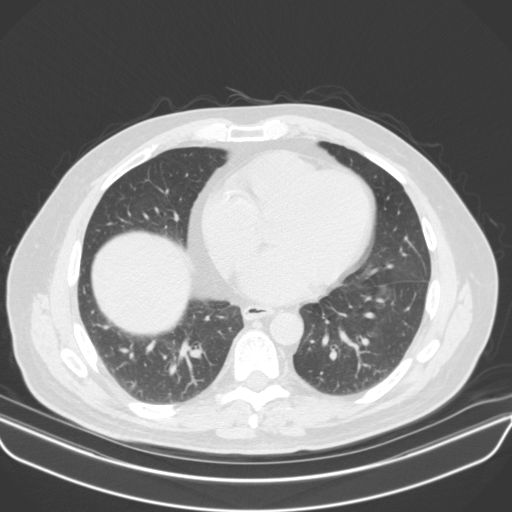
[im 79/152  lung]
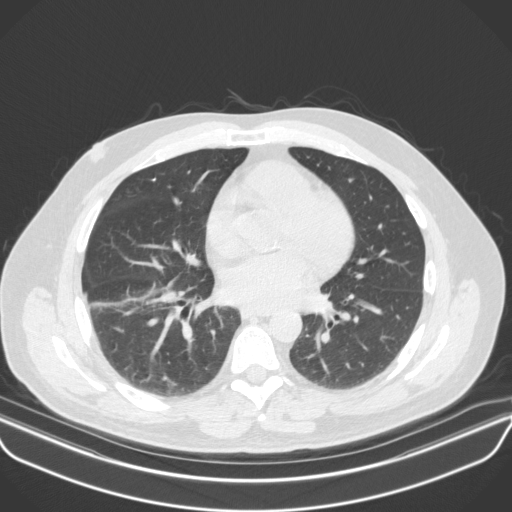
[im 84/152  mediastinal]
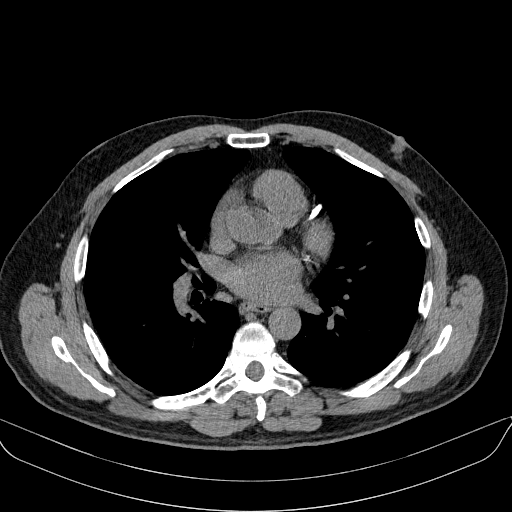
[im 84/152  lung]
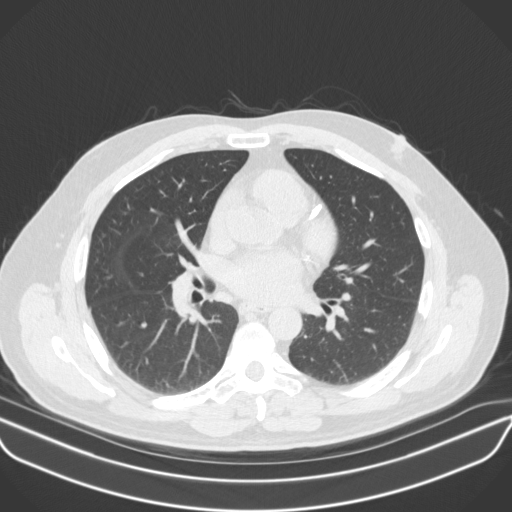
[im 91/152  lung]
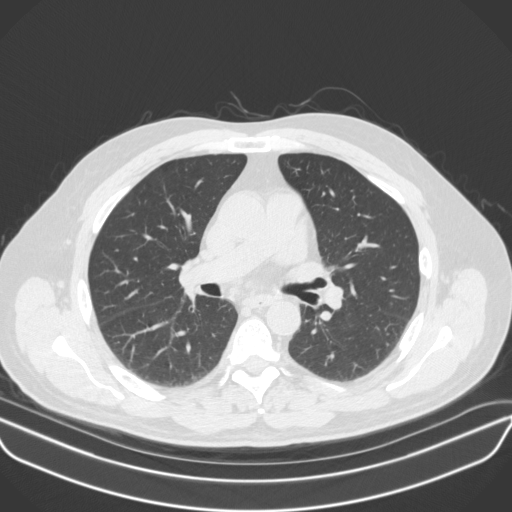
[im 101/152  lung]
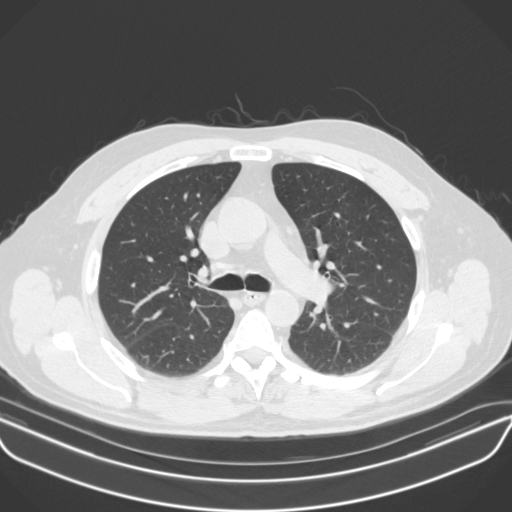
[im 112/152  lung]
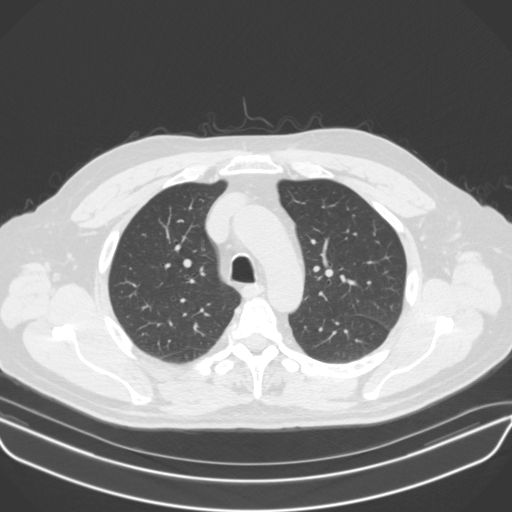
[im 121/152  mediastinal]
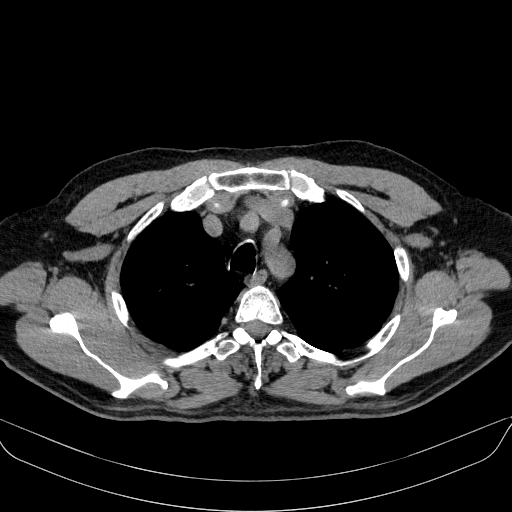
[im 121/152  lung]
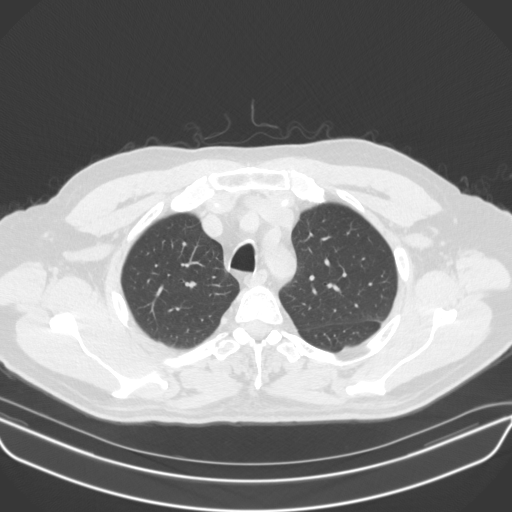
[im 129/152  lung]
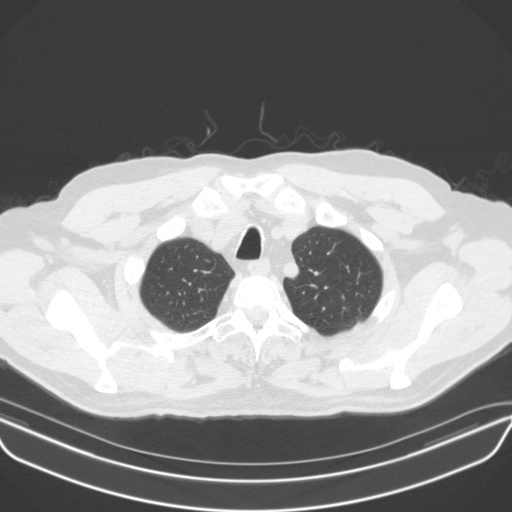
[im 140/152  lung]
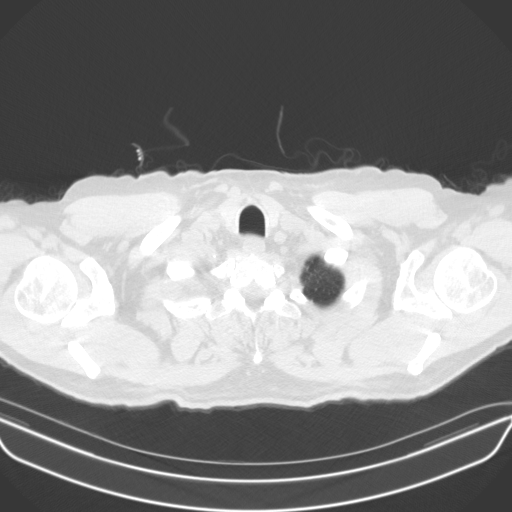

[15 of 34 positions shown; findings below may reference images not displayed]

FINDINGS: Cardiovascular: Heart size is normal. No pericardial effusion.
Aortic atherosclerosis. Coronary stents in place in the left
anterior descending artery.

Mediastinum/Nodes: No enlarged mediastinal or axillary lymph nodes.
Thyroid gland, trachea, and esophagus demonstrate no significant
findings.

Lungs/Pleura: There is bilateral peribronchial thickening with focal
linear atelectasis in the right lower lobe. No consolidative
infiltrates or effusions or pulmonary nodules.

Upper Abdomen: 16 mm low-density lesion in the dome of the left lobe
of the liver, most likely a cyst or small hemangioma. 3.1 cm cyst in
the upper pole of the left kidney, slightly increased in size since
renal ultrasound dated 04/12/2012.

Musculoskeletal: No chest wall mass or suspicious bone lesions
identified.
IMPRESSION: 1. Peribronchial thickening with slight atelectasis in the right
lower lobe suggesting bronchitis.
2. No other acute abnormalities.
3. Coronary artery stents.
4.  Aortic Atherosclerosis ([id number]).

## 2019-11-10 ENCOUNTER — Telehealth: Payer: Self-pay

## 2019-11-10 NOTE — Telephone Encounter (Signed)
   [facility] Pre-operative Risk Assessment   Request for surgical clearance:  1. What type of surgery is being performed? CATARACT EXTRACTION W/IMPLANTATION OF LEFT EYE FOLLOWED BY THE RIGHT EYE   2. When is this surgery scheduled? 11-28-2019   3. What type of clearance is required (medical clearance vs. Pharmacy clearance to hold med vs. Both)? BOTH  4. Are there any medications that need to be held prior to surgery and how long?PT TAKES XARELTO   5. Practice name and name of physician performing surgery? [facility]  ATTN:THERESA  6. What is the office phone number? [phone]   7.   What is the office fax number? [phone]  8.   Anesthesia type (None, local, MAC, general) ?  TOPICAL W/IV SEDATION

## 2019-11-10 NOTE — Telephone Encounter (Signed)
   Primary Cardiologist: Shelva Majestic, MD  Chart reviewed as part of pre-operative protocol coverage. Cataract extractions are recognized in guidelines as low risk surgeries that do not typically require specific preoperative testing or holding of blood thinner therapy. Therefore, given past medical history and time since last visit, based on ACC/AHA guidelines, Jose Warner would be at acceptable risk for the planned procedure without further cardiovascular testing.   I will route this recommendation to the requesting party via Epic fax function and remove from pre-op pool.  Please call with questions.  Kerin Ransom, PA-C 11/10/2019, 10:49 AM

## 2019-11-17 ENCOUNTER — Other Ambulatory Visit: Payer: Self-pay

## 2019-11-17 ENCOUNTER — Encounter (HOSPITAL_COMMUNITY): Payer: Self-pay | Admitting: Physician Assistant

## 2019-11-17 ENCOUNTER — Ambulatory Visit (HOSPITAL_COMMUNITY)
Admission: RE | Admit: 2019-11-17 | Discharge: 2019-11-17 | Disposition: A | Discharge status: Home / Self Care | Payer: Medicare Other | Source: Ambulatory Visit | Attending: Physician Assistant | Admitting: Physician Assistant

## 2019-11-17 VITALS — BP 110/56 | HR 54 | Ht 68.0 in | Wt 182.0 lb

## 2019-11-17 DIAGNOSIS — Z8546 Personal history of malignant neoplasm of prostate: Secondary | ICD-10-CM | POA: Diagnosis not present

## 2019-11-17 DIAGNOSIS — Z888 Allergy status to other drugs, medicaments and biological substances status: Secondary | ICD-10-CM | POA: Insufficient documentation

## 2019-11-17 DIAGNOSIS — Z7901 Long term (current) use of anticoagulants: Secondary | ICD-10-CM | POA: Diagnosis not present

## 2019-11-17 DIAGNOSIS — Z881 Allergy status to other antibiotic agents status: Secondary | ICD-10-CM | POA: Diagnosis not present

## 2019-11-17 DIAGNOSIS — K5790 Diverticulosis of intestine, part unspecified, without perforation or abscess without bleeding: Secondary | ICD-10-CM | POA: Insufficient documentation

## 2019-11-17 DIAGNOSIS — Z7952 Long term (current) use of systemic steroids: Secondary | ICD-10-CM | POA: Insufficient documentation

## 2019-11-17 DIAGNOSIS — Z7951 Long term (current) use of inhaled steroids: Secondary | ICD-10-CM | POA: Diagnosis not present

## 2019-11-17 DIAGNOSIS — I2510 Atherosclerotic heart disease of native coronary artery without angina pectoris: Secondary | ICD-10-CM | POA: Insufficient documentation

## 2019-11-17 DIAGNOSIS — Z792 Long term (current) use of antibiotics: Secondary | ICD-10-CM | POA: Diagnosis not present

## 2019-11-17 DIAGNOSIS — D509 Iron deficiency anemia, unspecified: Secondary | ICD-10-CM | POA: Diagnosis not present

## 2019-11-17 DIAGNOSIS — Z8249 Family history of ischemic heart disease and other diseases of the circulatory system: Secondary | ICD-10-CM | POA: Diagnosis not present

## 2019-11-17 DIAGNOSIS — B3781 Candidal esophagitis: Secondary | ICD-10-CM | POA: Insufficient documentation

## 2019-11-17 DIAGNOSIS — K900 Celiac disease: Secondary | ICD-10-CM | POA: Insufficient documentation

## 2019-11-17 DIAGNOSIS — Z79899 Other long term (current) drug therapy: Secondary | ICD-10-CM | POA: Diagnosis not present

## 2019-11-17 DIAGNOSIS — E785 Hyperlipidemia, unspecified: Secondary | ICD-10-CM | POA: Diagnosis not present

## 2019-11-17 DIAGNOSIS — I4819 Other persistent atrial fibrillation: Secondary | ICD-10-CM | POA: Insufficient documentation

## 2019-11-17 DIAGNOSIS — Z7982 Long term (current) use of aspirin: Secondary | ICD-10-CM | POA: Insufficient documentation

## 2019-11-17 DIAGNOSIS — Z86010 Personal history of colonic polyps: Secondary | ICD-10-CM | POA: Diagnosis not present

## 2019-11-17 DIAGNOSIS — K219 Gastro-esophageal reflux disease without esophagitis: Secondary | ICD-10-CM | POA: Insufficient documentation

## 2019-11-17 DIAGNOSIS — Z955 Presence of coronary angioplasty implant and graft: Secondary | ICD-10-CM | POA: Insufficient documentation

## 2019-11-17 DIAGNOSIS — D6869 Other thrombophilia: Secondary | ICD-10-CM

## 2019-11-17 LAB — BASIC METABOLIC PANEL
Anion gap: 7 (ref 5–15)
BUN: 12 mg/dL (ref 8–23)
CO2: 24 mmol/L (ref 22–32)
Calcium: 8.9 mg/dL (ref 8.9–10.3)
Chloride: 107 mmol/L (ref 98–111)
Creatinine, Ser: 1.01 mg/dL (ref 0.61–1.24)
GFR calc Af Amer: >60 mL/min (ref >60)
GFR calc non Af Amer: >60 mL/min (ref >60)
Glucose, Bld: 114 mg/dL — ABNORMAL HIGH (ref 70–99)
Potassium: 4.7 mmol/L (ref 3.5–5.1)
Sodium: 138 mmol/L (ref 135–145)

## 2019-11-17 LAB — MAGNESIUM: Magnesium: 2.4 mg/dL (ref 1.7–2.4)

## 2019-11-17 NOTE — Progress Notes (Signed)
Primary Care Physician: Derinda Late, MD Referring Physician: [facility] F/u Primary Cardiologist: [name] Primary EP: Dr Becky Sax Jose Warner is a 69 y.o. male with a h/o persistent afib, HLD, CAD, and prostate cancer who presents to the AF Clinic for follow up. Patient loaded on dofetilide 9/15-9/18/20. He converted to SR on the medication and did not require DCCV. He is on Xarelto for a CHADS2VASC score of 2. Patient was seen in the ER on 08/02/19 for acute CP. He was diagnosed with esophageal candidiasis (he had recently been on steroids and abx for a sinus infection). His CP resolved with GI cocktail.   On follow up today, patient reports he has done well since his last visit. He denies any heart racing or palpitations. He denies any bleeding issues on anticoagulation. He is scheduled for cataract surgery early August.   Today, he denies symptoms of palpitations, SOB, orthopnea, PND, lower extremity edema, presyncope, syncope, or neurologic sequela. The patient is tolerating medications without difficulties and is otherwise without complaint today.   Past Medical History:  Diagnosis Date  . Anemia   . Bronchitis 05-10-12   past fall- 4 runs antibiotics due to bronchitis-uses [employer] as needed  . Celiac disease   . Colon polyps    adenomatous  . Coronary artery disease   . Diverticulosis   . GERD (gastroesophageal reflux disease)   . Hepatitis 05-10-12   "was told non A, non B"-unclean dental equip.  Marland Kitchen Hyperlipidemia   . Iron deficiency anemia   . Pericarditis   . Persistent atrial fibrillation ([place]) 06/05/2017  . Prostate cancer ([place]) 05-10-12   ;bx. 04-12-12, dx  . Spasm of esophagus 2013   Past Surgical History:  Procedure Laterality Date  . CARDIAC CATHETERIZATION  09/13/2004   LAD:30%-40% in prox. to mid segment with 20% in the mid segment and 20% in left Circ., mild 20% right common iliac narrowing. medical therapy  . CARDIOVERSION  06/05/2017  .  CARDIOVERSION N/A 11/11/2018   Procedure: CARDIOVERSION;  Surgeon: Pixie Casino, MD;  Location: [facility] ENDOSCOPY;  Service: Cardiovascular;  Laterality: N/A;  . CORONARY STENT INTERVENTION N/A 06/11/2017   Procedure: CORONARY STENT INTERVENTION;  Surgeon: Belva Crome, MD;  Location: [place] CV LAB;  Service: Cardiovascular;  Laterality: N/A;  . HERNIA REPAIR  5 yrs ago   right   . LEFT HEART CATH AND CORONARY ANGIOGRAPHY N/A 06/11/2017   Procedure: LEFT HEART CATH AND CORONARY ANGIOGRAPHY;  Surgeon: Belva Crome, MD;  Location: [place] CV LAB;  Service: Cardiovascular;  Laterality: N/A;  . LEFT HEART CATH AND CORONARY ANGIOGRAPHY N/A 08/18/2017   Procedure: LEFT HEART CATH AND CORONARY ANGIOGRAPHY;  Surgeon: Martinique, Peter M, MD;  Location: [place] CV LAB;  Service: Cardiovascular;  Laterality: N/A;  . NM MYOCAR PERF WALL MOTION  11/30/2007   protocol:Bruce, post EF 67%,mild perfusion defect seen in basal inferior consistant with Attenuation artifact, exercise cap. 12METS  . ROBOT ASSISTED LAPAROSCOPIC RADICAL PROSTATECTOMY  05/20/2012   Procedure: ROBOTIC ASSISTED LAPAROSCOPIC RADICAL PROSTATECTOMY LEVEL 1;  Surgeon: Dutch Gray, MD;  Location: [facility] ORS;  Service: Urology;  Laterality: N/A;     . TRANSURETHRAL RESECTION OF PROSTATE  54yr ago    Current Outpatient Medications  Medication Sig Dispense Refill  . ADVAIR DISKUS 250-50 MCG/DOSE AEPB Inhale 1 puff into the lungs in the morning and at bedtime.     .Marland KitchenamLODipine (NORVASC) 2.5 MG tablet Take  1 tablet (2.5 mg total) by mouth daily. 90 tablet 3  . aspirin EC 81 MG tablet Take 1 tablet (81 mg total) by mouth daily. 90 tablet 3  . b complex vitamins tablet Take 1 tablet by mouth daily with supper.    . bisoprolol (ZEBETA) 5 MG tablet Take 2.5 mg by mouth daily.     Marland Kitchen dofetilide (TIKOSYN) 500 MCG capsule TAKE 1 CAPSULE TWICE A DAY 180 capsule 3  . DUREZOL 0.05 % EMUL Place 1 drop into the left eye 3 (three) times daily.    Marland Kitchen  ezetimibe (ZETIA) 10 MG tablet Take 10 mg by mouth every evening.     . fluocinonide cream (LIDEX) 7.12 % Apply 1 application topically 2 (two) times daily.     . fluticasone (FLONASE) 50 MCG/ACT nasal spray Place 1 spray into both nostrils daily.    Marland Kitchen gatifloxacin (ZYMAXID) 0.5 % SOLN Place 1 drop into the left eye 4 (four) times daily.    Marland Kitchen guaiFENesin (MUCINEX) 600 MG 12 hr tablet Take 600 mg by mouth daily.    . Homeopathic Products (ZICAM COLD REMEDY PO) Take 1 tablet by mouth in the morning and at bedtime.    . Multiple Vitamin (MULTIVITAMIN WITH MINERALS) TABS tablet Take 1 tablet by mouth daily with supper.    . niacin (NIASPAN) 1000 MG CR tablet Take 1,000 mg by mouth at bedtime.     . nitroGLYCERIN (NITROSTAT) 0.4 MG SL tablet Place 1 tablet (0.4 mg total) under the tongue every 5 (five) minutes x 3 doses as needed for chest pain. 30 tablet 0  . pantoprazole (PROTONIX) 40 MG tablet Take 40 mg by mouth 2 (two) times daily.     . pravastatin (PRAVACHOL) 80 MG tablet Take 80 mg by mouth every evening.     Marland Kitchen PROAIR HFA 108 (90 BASE) MCG/ACT inhaler Take 2 puffs by mouth every 4 (four) hours as needed for wheezing or shortness of breath. For shortness of breath.    Marland Kitchen PROLENSA 0.07 % SOLN Place 1 drop into the left eye at bedtime.    . rivaroxaban (XARELTO) 20 MG TABS tablet Take 1 tablet (20 mg total) by mouth daily with supper. 90 tablet 2  . Vitamin D, Ergocalciferol, 2000 units CAPS Take 2,000 Units by mouth at bedtime.      Current Facility-Administered Medications  Medication Dose Route Frequency Provider Last Rate Last Admin  . 0.9 %  sodium chloride infusion  500 mL Intravenous Once Irene Shipper, MD        Allergies  Allergen Reactions  . Gluten Meal Other (See Comments)    Celiac, stomach crams , gas  . Wheat Bran Other (See Comments)    Celiac   . Statins     Leg muscle pains - rosuvastatin, atorvastatin, zetia. Tolerates simvastatin  . Sulfa Antibiotics Rash    Social  History   Socioeconomic History  . Marital status: Married    Spouse name: Not on file  . Number of children: 3  . Years of education: Not on file  . Highest education level: Not on file  Occupational History    Employer: [employer]  Tobacco Use  . Smoking status: Never Smoker  . Smokeless tobacco: Never Used  Vaping Use  . Vaping Use: Never used  Substance and Sexual Activity  . Alcohol use: Never    Alcohol/week: 2.0 standard drinks    Types: 2 Glasses of wine per week  . Drug  use: No  . Sexual activity: Yes    Partners: Female  Other Topics Concern  . Not on file  Social History Narrative  . Not on file   Social Determinants of Health   Financial Resource Strain:   . Difficulty of Paying Living Expenses:   Food Insecurity:   . Worried About Charity fundraiser in the Last Year:   . Arboriculturist in the Last Year:   Transportation Needs:   . Film/video editor (Medical):   Marland Kitchen Lack of Transportation (Non-Medical):   Physical Activity:   . Days of Exercise per Week:   . Minutes of Exercise per Session:   Stress:   . Feeling of Stress :   Social Connections:   . Frequency of Communication with Friends and Family:   . Frequency of Social Gatherings with Friends and Family:   . Attends Religious Services:   . Active Member of Clubs or Organizations:   . Attends Archivist Meetings:   Marland Kitchen Marital Status:   Intimate Partner Violence:   . Fear of Current or Ex-Partner:   . Emotionally Abused:   Marland Kitchen Physically Abused:   . Sexually Abused:     Family History  Problem Relation Age of Onset  . Stroke Mother   . Heart disease Father   . Heart attack Father   . Cancer Sister   . Stroke Maternal Grandfather   . Cancer Paternal Grandmother   . Heart attack Paternal Grandfather   . Heart disease Paternal Grandfather   . Colon cancer Neg Hx   . Esophageal cancer Neg Hx   . Pancreatic cancer Neg Hx   . Rectal cancer Neg Hx   . Stomach cancer Neg Hx      ROS- All systems are reviewed and negative except as per the HPI above  Physical Exam: Vitals:   11/17/19 0834  BP: (!) 110/56  Pulse: 54  Weight: 82.6 kg  Height: 5' 8"  (1.727 m)   Wt Readings from Last 3 Encounters:  11/17/19 82.6 kg  08/18/19 81.9 kg  05/19/19 82.1 kg    Labs: Lab Results  Component Value Date   NA 139 08/02/2019   K 4.4 08/02/2019   CL 107 08/02/2019   CO2 21 (L) 08/02/2019   GLUCOSE 114 (H) 08/02/2019   BUN 13 08/02/2019   CREATININE 1.04 08/02/2019   CALCIUM 9.1 08/02/2019   PHOS 3.7 12/28/2017   MG 2.3 08/18/2019   Lab Results  Component Value Date   INR 1.18 06/11/2017   Lab Results  Component Value Date   CHOL 122 06/10/2017   HDL 41 06/10/2017   LDLCALC 60 06/10/2017   TRIG 103 06/10/2017    GEN- The patient is well appearing, alert and oriented x 3 today.   HEENT-head normocephalic, atraumatic, sclera clear, conjunctiva pink, hearing intact, trachea midline. Lungs- Clear to ausculation bilaterally, normal work of breathing Heart- Regular rate and rhythm, no murmurs, rubs or gallops  GI- soft, NT, ND, + BS Extremities- no clubbing, cyanosis, or edema MS- no significant deformity or atrophy Skin- no rash or lesion Psych- euthymic mood, full affect Neuro- strength and sensation are intact   EKG- SB HR 54, PR 180, QRS 84, QTc 458  Epic records reviewed  Cardiac CT- 9/10-IMPRESSION: 1. Normal left ventricular size, thickness and hyperdynamic systolic function (LVEF = 71%). There are no regional wall motion abnormalities. There is no late gadolinium enhancement in the left ventricular myocardium. 2.  Normal right ventricular size, thickness and systolic function (LVEF = 73%). There are no regional wall motion abnormalities. 3. Mildly dilated left atrium. There is atypical LGE of the left and right atrial walls suspicious for fibrosis. 4. Normal size of the aortic root, ascending aorta and pulmonary artery. 5.  Mild aortic  and mitral and trivial tricuspid regurgitation. 6. No pericardial effusion. There is mild late gadolinium enhancement adjacent to the inferolateral and anterior left ventricular walls and RV free wall.  Collectively, these findings are consistent with an acute pericarditis. LVEF has improved when compared to the prior echocardiogram on 06/10/2017 from 50-55% to 71%.  Echo 01/07/19 demonstrated  1. Left ventricular ejection fraction, by visual estimation, is 55 to 60%. The left ventricle has normal function. Normal left ventricular size. There is mildly increased left ventricular hypertrophy.  2. Elevated left ventricular end-diastolic pressure.  3. Left ventricular diastolic Doppler parameters are consistent with pseudonormalization pattern of LV diastolic filling.  4. Global right ventricle has normal systolic function.The right ventricular size is normal. No increase in right ventricular wall thickness.  5. Left atrial size was moderately dilated.  6. Right atrial size was normal.  7. The mitral valve is normal in structure. Mild mitral valve regurgitation. No evidence of mitral stenosis.  8. The tricuspid valve is normal in structure. Tricuspid valve regurgitation is mild.  9. The aortic valve is normal in structure. Aortic valve regurgitation is mild to moderate by color flow Doppler. Structurally normal aortic valve, with no evidence of sclerosis or stenosis. 10. The pulmonic valve was normal in structure. Pulmonic valve regurgitation is not visualized by color flow Doppler. 11. The inferior vena cava is normal in size with greater than 50% respiratory variability, suggesting right atrial pressure of 3 mmHg.   Assessment and Plan: 1. Persistent atrial fibrillation S/p dofetilide loading 9/15-9/18/20. Patient appears to be maintaining SR. Continue dofetilide 500 mcg BID. QT stable. Check bmet/mag today. Continue Xarelto 20 mg daily Continue bisoprolol to 2.5 mg daily. Patient may be  interested in ablation if he fails dofetilide.   This patients CHA2DS2-VASc Score and unadjusted Ischemic Stroke Rate (% per year) is equal to 2.2 % stroke rate/year from a score of 2  Above score calculated as 1 point each if present [CHF, HTN, DM, Vascular=MI/PAD/Aortic Plaque, Age if 65-74, or Male] Above score calculated as 2 points each if present [Age > 75, or Stroke/TIA/TE]  2. CAD DES to LAD 06/11/17. No anginal symptoms.   Follow up in the AF clinic in 4 months.   [facility] [address] [place], [place] [address] [phone]

## 2019-11-18 NOTE — Telephone Encounter (Signed)
Helene Kelp is calling to follow up on the patient's clearance due to never receiving or hearing anything back. Please fax clearance to [phone].

## 2019-11-23 ENCOUNTER — Telehealth: Payer: Self-pay | Admitting: Cardiovascular Disease

## 2019-11-23 NOTE — Telephone Encounter (Signed)
° ° °  Jose Warner calling to follow up clearance. While on the phone she found the clearance report. She confirmed she received it

## 2020-03-14 NOTE — Progress Notes (Signed)
Primary Care Physician: Derinda Late, MD Referring Physician: [facility] F/u Primary Cardiologist: [name] Primary EP: Dr Becky Sax Jose Warner is a 69 y.o. male with a h/o persistent afib, HLD, CAD, and prostate cancer who presents to the AF Clinic for follow up. Patient loaded on dofetilide 9/15-9/18/20. He converted to SR on the medication and did not require DCCV. He is on Xarelto for a CHADS2VASC score of 2. Patient was seen in the ER on 08/02/19 for acute CP. He was diagnosed with esophageal candidiasis (he had recently been on steroids and abx for a sinus infection). His CP resolved with GI cocktail.   On follow up today, patient reports that he has done very well since his last visit. He denies any heart racing or palpitations. He denies any bleeding issues on anticoagulation.   Today, he denies symptoms of palpitations, SOB, orthopnea, PND, lower extremity edema, presyncope, syncope, or neurologic sequela. The patient is tolerating medications without difficulties and is otherwise without complaint today.   Past Medical History:  Diagnosis Date  . Anemia   . Bronchitis 05-10-12   past fall- 4 runs antibiotics due to bronchitis-uses [employer] as needed  . Celiac disease   . Colon polyps    adenomatous  . Coronary artery disease   . Diverticulosis   . GERD (gastroesophageal reflux disease)   . Hepatitis 05-10-12   "was told non A, non B"-unclean dental equip.  Marland Kitchen Hyperlipidemia   . Iron deficiency anemia   . Pericarditis   . Persistent atrial fibrillation ([place]) 06/05/2017  . Prostate cancer ([place]) 05-10-12   ;bx. 04-12-12, dx  . Spasm of esophagus 2013   Past Surgical History:  Procedure Laterality Date  . CARDIAC CATHETERIZATION  09/13/2004   LAD:30%-40% in prox. to mid segment with 20% in the mid segment and 20% in left Circ., mild 20% right common iliac narrowing. medical therapy  . CARDIOVERSION  06/05/2017  . CARDIOVERSION N/A 11/11/2018   Procedure:  CARDIOVERSION;  Surgeon: Pixie Casino, MD;  Location: [facility] ENDOSCOPY;  Service: Cardiovascular;  Laterality: N/A;  . CORONARY STENT INTERVENTION N/A 06/11/2017   Procedure: CORONARY STENT INTERVENTION;  Surgeon: Belva Crome, MD;  Location: [place] CV LAB;  Service: Cardiovascular;  Laterality: N/A;  . HERNIA REPAIR  5 yrs ago   right   . LEFT HEART CATH AND CORONARY ANGIOGRAPHY N/A 06/11/2017   Procedure: LEFT HEART CATH AND CORONARY ANGIOGRAPHY;  Surgeon: Belva Crome, MD;  Location: [place] CV LAB;  Service: Cardiovascular;  Laterality: N/A;  . LEFT HEART CATH AND CORONARY ANGIOGRAPHY N/A 08/18/2017   Procedure: LEFT HEART CATH AND CORONARY ANGIOGRAPHY;  Surgeon: Martinique, Peter M, MD;  Location: [place] CV LAB;  Service: Cardiovascular;  Laterality: N/A;  . NM MYOCAR PERF WALL MOTION  11/30/2007   protocol:[place], post EF 67%,mild perfusion defect seen in basal inferior consistant with Attenuation artifact, exercise cap. 12METS  . ROBOT ASSISTED LAPAROSCOPIC RADICAL PROSTATECTOMY  05/20/2012   Procedure: ROBOTIC ASSISTED LAPAROSCOPIC RADICAL PROSTATECTOMY LEVEL 1;  Surgeon: Dutch Gray, MD;  Location: [facility] ORS;  Service: Urology;  Laterality: N/A;     . TRANSURETHRAL RESECTION OF PROSTATE  49yr ago    Current Outpatient Medications  Medication Sig Dispense Refill  . amLODipine (NORVASC) 2.5 MG tablet Take 1 tablet (2.5 mg total) by mouth daily. 90 tablet 3  . aspirin EC 81 MG tablet Take 1 tablet (81 mg total) by mouth daily. 9[place]  tablet 3  . b complex vitamins tablet Take 1 tablet by mouth daily with supper.    . bisoprolol (ZEBETA) 5 MG tablet Take 2.5 mg by mouth daily.     . budesonide-formoterol (SYMBICORT) 80-4.5 MCG/ACT inhaler SMARTSIG:By Mouth    . dofetilide (TIKOSYN) 500 MCG capsule TAKE 1 CAPSULE TWICE A DAY 180 capsule 3  . ezetimibe (ZETIA) 10 MG tablet Take 10 mg by mouth every evening.     . fluocinonide cream (LIDEX) 7.61 % Apply 1 application topically 2 (two)  times daily.     . fluticasone (FLONASE) 50 MCG/ACT nasal spray Place 1 spray into both nostrils daily.    Marland Kitchen guaiFENesin (MUCINEX) 600 MG 12 hr tablet Take 600 mg by mouth daily.    . Homeopathic Products (ZICAM COLD REMEDY PO) Take 1 tablet by mouth in the morning and at bedtime.    . Multiple Vitamin (MULTIVITAMIN WITH MINERALS) TABS tablet Take 1 tablet by mouth daily with supper.    . niacin (NIASPAN) 1000 MG CR tablet Take 1,000 mg by mouth at bedtime.     . nitroGLYCERIN (NITROSTAT) 0.4 MG SL tablet Place 1 tablet (0.4 mg total) under the tongue every 5 (five) minutes x 3 doses as needed for chest pain. 30 tablet 0  . pantoprazole (PROTONIX) 40 MG tablet Take 40 mg by mouth 2 (two) times daily.     . pravastatin (PRAVACHOL) 80 MG tablet Take 80 mg by mouth every evening.     Marland Kitchen PROAIR HFA 108 (90 BASE) MCG/ACT inhaler Take 2 puffs by mouth every 4 (four) hours as needed for wheezing or shortness of breath. For shortness of breath.    . rivaroxaban (XARELTO) 20 MG TABS tablet Take 1 tablet (20 mg total) by mouth daily with supper. 90 tablet 2  . Vitamin D, Ergocalciferol, 2000 units CAPS Take 2,000 Units by mouth at bedtime.      Current Facility-Administered Medications  Medication Dose Route Frequency Provider Last Rate Last Admin  . 0.9 %  sodium chloride infusion  500 mL Intravenous Once Irene Shipper, MD        Allergies  Allergen Reactions  . Gluten Meal Other (See Comments)    Celiac, stomach crams , gas  . Wheat Bran Other (See Comments)    Celiac   . Statins     Leg muscle pains - rosuvastatin, atorvastatin, zetia. Tolerates simvastatin  . Sulfa Antibiotics Rash    Social History   Socioeconomic History  . Marital status: Married    Spouse name: Not on file  . Number of children: 3  . Years of education: Not on file  . Highest education level: Not on file  Occupational History    Employer: [employer]  Tobacco Use  . Smoking status: Never Smoker  . Smokeless  tobacco: Never Used  Vaping Use  . Vaping Use: Never used  Substance and Sexual Activity  . Alcohol use: Not Currently    Alcohol/week: 2.0 standard drinks    Types: 2 Glasses of wine per week  . Drug use: No  . Sexual activity: Yes    Partners: Female  Other Topics Concern  . Not on file  Social History Narrative  . Not on file   Social Determinants of Health   Financial Resource Strain:   . Difficulty of Paying Living Expenses: Not on file  Food Insecurity:   . Worried About Charity fundraiser in the Last Year: Not on file  .  Ran Out of Food in the Last Year: Not on file  Transportation Needs:   . Lack of Transportation (Medical): Not on file  . Lack of Transportation (Non-Medical): Not on file  Physical Activity:   . Days of Exercise per Week: Not on file  . Minutes of Exercise per Session: Not on file  Stress:   . Feeling of Stress : Not on file  Social Connections:   . Frequency of Communication with Friends and Family: Not on file  . Frequency of Social Gatherings with Friends and Family: Not on file  . Attends Religious Services: Not on file  . Active Member of Clubs or Organizations: Not on file  . Attends Archivist Meetings: Not on file  . Marital Status: Not on file  Intimate Partner Violence:   . Fear of Current or Ex-Partner: Not on file  . Emotionally Abused: Not on file  . Physically Abused: Not on file  . Sexually Abused: Not on file    Family History  Problem Relation Age of Onset  . Stroke Mother   . Heart disease Father   . Heart attack Father   . Cancer Sister   . Stroke Maternal Grandfather   . Cancer Paternal Grandmother   . Heart attack Paternal Grandfather   . Heart disease Paternal Grandfather   . Colon cancer Neg Hx   . Esophageal cancer Neg Hx   . Pancreatic cancer Neg Hx   . Rectal cancer Neg Hx   . Stomach cancer Neg Hx     ROS- All systems are reviewed and negative except as per the HPI above  Physical  Exam: Vitals:   03/19/20 0828  BP: 110/60  Pulse: (!) 57  Weight: 84.6 kg  Height: 5' 8"  (1.727 m)   Wt Readings from Last 3 Encounters:  03/19/20 84.6 kg  11/17/19 82.6 kg  08/18/19 81.9 kg    Labs: Lab Results  Component Value Date   NA 138 11/17/2019   K 4.7 11/17/2019   CL 107 11/17/2019   CO2 24 11/17/2019   GLUCOSE 114 (H) 11/17/2019   BUN 12 11/17/2019   CREATININE 1.01 11/17/2019   CALCIUM 8.9 11/17/2019   PHOS 3.7 12/28/2017   MG 2.4 11/17/2019   Lab Results  Component Value Date   INR 1.18 06/11/2017   Lab Results  Component Value Date   CHOL 122 06/10/2017   HDL 41 06/10/2017   LDLCALC 60 06/10/2017   TRIG 103 06/10/2017    GEN- The patient is well appearing, alert and oriented x 3 today.   HEENT-head normocephalic, atraumatic, sclera clear, conjunctiva pink, hearing intact, trachea midline. Lungs- Clear to ausculation bilaterally, normal work of breathing Heart- Regular rate and rhythm, no murmurs, rubs or gallops  GI- soft, NT, ND, + BS Extremities- no clubbing, cyanosis, or edema MS- no significant deformity or atrophy Skin- no rash or lesion Psych- euthymic mood, full affect Neuro- strength and sensation are intact   EKG- SB HR 57, PR 168, QRS 90, QTc 463   Epic records reviewed  Cardiac CT- 9/10-IMPRESSION: 1. Normal left ventricular size, thickness and hyperdynamic systolic function (LVEF = 71%). There are no regional wall motion abnormalities. There is no late gadolinium enhancement in the left ventricular myocardium. 2. Normal right ventricular size, thickness and systolic function (LVEF = 73%). There are no regional wall motion abnormalities. 3. Mildly dilated left atrium. There is atypical LGE of the left and right atrial walls suspicious for fibrosis.  4. Normal size of the aortic root, ascending aorta and pulmonary artery. 5.  Mild aortic and mitral and trivial tricuspid regurgitation. 6. No pericardial effusion. There is mild  late gadolinium enhancement adjacent to the inferolateral and anterior left ventricular walls and RV free wall.  Collectively, these findings are consistent with an acute pericarditis. LVEF has improved when compared to the prior echocardiogram on 06/10/2017 from 50-55% to 71%.  Echo 01/07/19 demonstrated  1. Left ventricular ejection fraction, by visual estimation, is 55 to 60%. The left ventricle has normal function. Normal left ventricular size. There is mildly increased left ventricular hypertrophy.  2. Elevated left ventricular end-diastolic pressure.  3. Left ventricular diastolic Doppler parameters are consistent with pseudonormalization pattern of LV diastolic filling.  4. Global right ventricle has normal systolic function.The right ventricular size is normal. No increase in right ventricular wall thickness.  5. Left atrial size was moderately dilated.  6. Right atrial size was normal.  7. The mitral valve is normal in structure. Mild mitral valve regurgitation. No evidence of mitral stenosis.  8. The tricuspid valve is normal in structure. Tricuspid valve regurgitation is mild.  9. The aortic valve is normal in structure. Aortic valve regurgitation is mild to moderate by color flow Doppler. Structurally normal aortic valve, with no evidence of sclerosis or stenosis. 10. The pulmonic valve was normal in structure. Pulmonic valve regurgitation is not visualized by color flow Doppler. 11. The inferior vena cava is normal in size with greater than 50% respiratory variability, suggesting right atrial pressure of 3 mmHg.   Assessment and Plan: 1. Persistent atrial fibrillation S/p dofetilide loading 9/15-9/18/20. Patient appears to be maintaining SR. Check bmet/mag Continue dofetilide 500 mcg BID. QT stable. Continue Xarelto 20 mg daily Continue bisoprolol 2.5 mg daily. Patient may be interested in ablation if he fails dofetilide.   This patients CHA2DS2-VASc Score and unadjusted  Ischemic Stroke Rate (% per year) is equal to 2.2 % stroke rate/year from a score of 2  Above score calculated as 1 point each if present [CHF, HTN, DM, Vascular=MI/PAD/Aortic Plaque, Age if 65-74, or Male] Above score calculated as 2 points each if present [Age > 75, or Stroke/TIA/TE]  2. CAD DES to LAD 06/11/17. No anginal symptoms.   Follow up in the AF clinic in 6 months.    [facility] [address] [place], [place] [address] [phone]

## 2020-03-19 ENCOUNTER — Encounter (HOSPITAL_COMMUNITY): Payer: Self-pay | Admitting: Physician Assistant

## 2020-03-19 ENCOUNTER — Other Ambulatory Visit: Payer: Self-pay

## 2020-03-19 ENCOUNTER — Ambulatory Visit (HOSPITAL_COMMUNITY)
Admission: RE | Admit: 2020-03-19 | Discharge: 2020-03-19 | Disposition: A | Discharge status: Home / Self Care | Payer: Medicare Other | Source: Ambulatory Visit | Attending: Physician Assistant | Admitting: Physician Assistant

## 2020-03-19 VITALS — BP 110/60 | HR 57 | Ht 68.0 in | Wt 186.4 lb

## 2020-03-19 DIAGNOSIS — Z8546 Personal history of malignant neoplasm of prostate: Secondary | ICD-10-CM | POA: Insufficient documentation

## 2020-03-19 DIAGNOSIS — Z823 Family history of stroke: Secondary | ICD-10-CM | POA: Diagnosis not present

## 2020-03-19 DIAGNOSIS — Z8249 Family history of ischemic heart disease and other diseases of the circulatory system: Secondary | ICD-10-CM | POA: Diagnosis not present

## 2020-03-19 DIAGNOSIS — Z7982 Long term (current) use of aspirin: Secondary | ICD-10-CM | POA: Diagnosis not present

## 2020-03-19 DIAGNOSIS — I4819 Other persistent atrial fibrillation: Secondary | ICD-10-CM

## 2020-03-19 DIAGNOSIS — I2510 Atherosclerotic heart disease of native coronary artery without angina pectoris: Secondary | ICD-10-CM | POA: Diagnosis not present

## 2020-03-19 DIAGNOSIS — Z955 Presence of coronary angioplasty implant and graft: Secondary | ICD-10-CM | POA: Insufficient documentation

## 2020-03-19 DIAGNOSIS — Z7901 Long term (current) use of anticoagulants: Secondary | ICD-10-CM | POA: Diagnosis not present

## 2020-03-19 DIAGNOSIS — E785 Hyperlipidemia, unspecified: Secondary | ICD-10-CM | POA: Insufficient documentation

## 2020-03-19 DIAGNOSIS — D6869 Other thrombophilia: Secondary | ICD-10-CM

## 2020-03-19 DIAGNOSIS — Z79899 Other long term (current) drug therapy: Secondary | ICD-10-CM | POA: Diagnosis not present

## 2020-03-19 DIAGNOSIS — Z7951 Long term (current) use of inhaled steroids: Secondary | ICD-10-CM | POA: Insufficient documentation

## 2020-03-19 LAB — BASIC METABOLIC PANEL
Anion gap: 11 (ref 5–15)
BUN: 19 mg/dL (ref 8–23)
CO2: 21 mmol/L — ABNORMAL LOW (ref 22–32)
Calcium: 8.8 mg/dL — ABNORMAL LOW (ref 8.9–10.3)
Chloride: 105 mmol/L (ref 98–111)
Creatinine, Ser: 1 mg/dL (ref 0.61–1.24)
GFR, Estimated: >60 mL/min (ref >60)
Glucose, Bld: 114 mg/dL — ABNORMAL HIGH (ref 70–99)
Potassium: 3.9 mmol/L (ref 3.5–5.1)
Sodium: 137 mmol/L (ref 135–145)

## 2020-03-19 LAB — MAGNESIUM: Magnesium: 2.1 mg/dL (ref 1.7–2.4)

## 2020-03-28 ENCOUNTER — Other Ambulatory Visit: Payer: Self-pay

## 2020-03-28 ENCOUNTER — Encounter: Payer: Self-pay | Admitting: Cardiovascular Disease

## 2020-03-28 ENCOUNTER — Ambulatory Visit (INDEPENDENT_AMBULATORY_CARE_PROVIDER_SITE_OTHER): Payer: Medicare Other | Admitting: Cardiovascular Disease

## 2020-03-28 DIAGNOSIS — I480 Paroxysmal atrial fibrillation: Secondary | ICD-10-CM

## 2020-03-28 DIAGNOSIS — Z5181 Encounter for therapeutic drug level monitoring: Secondary | ICD-10-CM

## 2020-03-28 DIAGNOSIS — Z7901 Long term (current) use of anticoagulants: Secondary | ICD-10-CM

## 2020-03-28 DIAGNOSIS — I2510 Atherosclerotic heart disease of native coronary artery without angina pectoris: Secondary | ICD-10-CM

## 2020-03-28 DIAGNOSIS — I319 Disease of pericardium, unspecified: Secondary | ICD-10-CM

## 2020-03-28 DIAGNOSIS — E782 Mixed hyperlipidemia: Secondary | ICD-10-CM

## 2020-03-28 DIAGNOSIS — Z9861 Coronary angioplasty status: Secondary | ICD-10-CM

## 2020-03-28 DIAGNOSIS — Z8546 Personal history of malignant neoplasm of prostate: Secondary | ICD-10-CM | POA: Diagnosis not present

## 2020-03-28 MED ORDER — NITROGLYCERIN 0.4 MG SL SUBL: 0.4000 mg | SUBLINGUAL_TABLET | SUBLINGUAL | 11 refills | Status: DC | PRN

## 2020-03-28 NOTE — Patient Instructions (Addendum)
Medication Instructions:  No changes *If you need a refill on your cardiac medications before your next appointment, please call your pharmacy*   Lab Work: None ordered If you have labs (blood work) drawn today and your tests are completely normal, you will receive your results only by: Marland Kitchen MyChart Message (if you have MyChart) OR . A paper copy in the mail If you have any lab test that is abnormal or we need to change your treatment, we will call you to review the results.   Testing/Procedures: None ordered   Follow-Up: At [facility] you and your health needs are our priority.  As part of our continuing mission to provide you with exceptional heart care, we have created designated Provider Care Teams.  These Care Teams include your primary Cardiologist (physician) and Advanced Practice Providers (APPs -  Physician Assistants and Nurse Practitioners) who all work together to provide you with the care you need, when you need it.  We recommend signing up for the patient portal called "MyChart".  Sign up information is provided on this After Visit Summary.  MyChart is used to connect with patients for Virtual Visits (Telemedicine).  Patients are able to view lab/test results, encounter notes, upcoming appointments, etc.  Non-urgent messages can be sent to your provider as well.   To learn more about what you can do with MyChart, go to [email or web address].    Your next appointment:   12 month(s)  The format for your next appointment:   In Person  Your physician wants you to follow-up in: 12 months. You will receive a reminder letter in the mail two months in advance. If you don't receive a letter, please call our office to schedule the follow-up appointment.  Provider:   Shelva Majestic, MD   Other Instructions Have Dr. Sandi Mariscal check an EKG for you at your next appointment

## 2020-03-28 NOTE — Progress Notes (Signed)
Patient ID: Jose Warner, male   DOB: 07-08-1950, 69 y.o.   MRN: [id number]     PCP: Dr. Derinda Late   HPI: Jose Warner is a 69 y.o. male who presents for a 59-monthfollow-up cardiology evaluation.   Mr. DNicodemushas a strong family history for premature coronary artery disease. In May 2006 cardiac catheterization showed very mild LAD and circumflex narrowing felt to be not flow limiting at approximately 20-30%. He has a history of low HDL levels. He developed focal prostate cancer and underwent robotic prostatectomy by Dr. GAraceli Bouchein 2014 When I saw him after his prostate surgery in March 2014 he was in atrial fibrillation at which time he was completely unaware of this rhythm disturbance. He was started on eliquis 5 mg twice a day anticoagulation and as well as metoprolol succinate. He subsequently converted spontaneously to sinus rhythm and has been maintaining sinus rhythm since. He remains active. He exercises daily. He denies any chest pain. He denies shortness of breath. He denies palpitations. His echo Doppler study showed an ejection fraction in the 55-65% range. He had  upper normal LA size. There is mild aortic insufficiency. Pulmonary pressures were normal with an estimated RV systolic pressure 15 mm.  In the past, he had been on Niaspan for low HDL levels. This was stopped when he was also put on cialis following his prostate surgery. He no longer takes cialis.  Laboratory done in April 2014 showed cholesterol 131 triglycerides 96 his HDL remains very low at 23 and his LDL was 89. Thyroid function studies were normal as were his hemoglobin hematocrit and chemistries.  [place] is [place] from [place] doing p[employer]wif[place]ork with travel.  He denies any episodes of chest pain.  Dr. BSandi Mariscalhas put him back on his simvastatin 40 mg and niacin 1000 mg based on laboratory that he had done.  He continues to take Bystolic 5 mg for hypertension.  He continues to have  difficulty with erectile function following his prostate surgery.  He also was diagnosed with celiac disease and is extremely sensitive to gluten.   He remains active.  He is not aware of any recurrent arrhythmia.  Dr. BSandi Mariscalchecks laboratory and he was told that his labs most recently were excellent.  He is tolerating Bystolic 5 mg.  He is followed by Dr. BAlinda Moneyfor his prostate.  He is on a gluten-free diet.   He was hospitalized on 06/09/2017 after having experienced intermittent chest pain  He was felt to potentially have unstableand stenting of a 95% mid LAD stenosiswithin a diffusely diseasedlcified segmen  There also was mild 40% narrowing proximal and distal to the stent.  He was discharged on 06/12/2017.  He felt vague recurrent chest pain leading to an additional overnight hospital stayon from 324.  Troponins were negative.  ECG was without ischemic changes and he was   Since he was bradycardic with heart rates in the 40s.    When I saw him for follow-up evaluation in March 2019 well and was gradually gaining his strength.  Because of his PAF, he was on Plavix and Xarelto.  He also was on bisoprolol 2.5 mg daily.  They continue to be on niacin and simvastatin per Dr. BSandi Mariscaland is on Symbicort with a history of asthma.   He developed a different type of chest pain that was lasted tense but had occurred consecutively on 3 days in a row.  He had taken nitroglycerin with  some improvement.  He was rehospitalized on August 17, 2017 and underwent repeat cardiac catheterization August 18, 2017 by Dr. Martinique.  His LAD stent was patent but he had 30% proximal, 40% mid, and 20% distal LAD stenoses.  He has felt improved.  He was treated for possible bronchitis and was given Z-Pak by Dr. Sandi Mariscal.    He was hospitalized from September 9 through December 30, 2017 after developing left upper chest, neck pain associated with left arm numbness not responsive to nitroglycerin.  His ECG did not reveal  ischemic changes.  D-dimer was detectable.  He was diagnosed as having acute pericarditis which was confirmed on MRI imaging and he was treated with ibuprofen in addition to colchicine.  CT also demonstrated cervical spondylosis without acute findings.  His symptoms have gradually improved therapy.   I saw him on January 19, 2018.  At that time he was feeling well but still experienced occasional sharp discomfort but denied any classic pleuritic-like symptoms.  He completed a 50-monthcourse of colchicine for his pericarditis.  He represented to the emergency room on November 1 with recurrent AF and was seen in follow-up in the A. fib clinic with DRoderic Palau  He was not cardioverted in the ER due to his ongoing treatment for pericarditis and he continued to be on bisoprolol 5 mg daily in addition to Xarelto.  He saw Dr. ARayann Hemanin follow-up March 08, 2018 and during that evaluation it was noted that he had converted on his own back to sinus rhythm.   When I last saw him in March 2020 he denied any recurrent awareness of atrial fibrillation.  Dr. PFilbert Bertholdis is primary care physician who checks his laboratory.  When I saw him he was without recurrent pleuritic chest pain.    He developed recurrent atrial fibrillation in July and underwent successful DC cardioversion on November 11, 2018.  Unfortunately, he developed recurrent atrial fibrillation and was therefore admitted to the hospital for Tikosyn loading on September 15 through January 07, 2019.  He converted to sinus rhythm on the medication and did not require repeat DC cardioversion.  I last saw him in follow-up in October 2020.  At that time he was unaware of recurrent episodes of atrial fibrillation since Tikosyn initiation.  He continued to be on anticoagulation therapy.  He denied any recurrent pleuritic symptoms.  He continued to be on pravastatin 80 mg for hyperlipidemia in addition to niacin per Dr. BSandi Mariscalapparently due to particle  size.  He also has been on bisoprolol 5 mg in the morning and 2.5 mg at night as well as amlodipine 2.5 mg daily.  He denies any recurrent anginal symptoms.    Since I last saw him, he has been followed in the atrial fibrillation clinic and also sees Dr. BSandi Mariscal  He believes he may have had a fibrillation 1 day when he was on prednisone therapy and antibiotics for sinus infection.  He was last evaluated in A. fib clinic in March 19, 2020.  Presently, he feels well.  He denies any chest pain or shortness of breath.  He continues to be on amlodipine 2.5 mg daily, bisoprolol 2.5 mg, for hypertension.  He is on Tikosyn 500 mg twice a day for his atrial fibrillation.  He continues to be on Symbicort and as needed albuterol for asthma.  He is on Xarelto 20 mg daily for anticoagulation.  He continues to be on Zetia 10 mg, pravastatin 80 mg, and slow  release niacin 1000 mg daily for mixed hyperlipidemia.  He presents for evaluation  Past Medical History:  Diagnosis Date  . Anemia   . Bronchitis 05-10-12   past fall- 4 runs antibiotics due to bronchitis-uses [employer] as needed  . Celiac disease   . Colon polyps    adenomatous  . Coronary artery disease   . Diverticulosis   . GERD (gastroesophageal reflux disease)   . Hepatitis 05-10-12   "was told non A, non B"-unclean dental equip.  Marland Kitchen Hyperlipidemia   . Iron deficiency anemia   . Pericarditis   . Persistent atrial fibrillation ([place]) 06/05/2017  . Prostate cancer ([place]) 05-10-12   ;bx. 04-12-12, dx  . Spasm of esophagus 2013    Past Surgical History:  Procedure Laterality Date  . CARDIAC CATHETERIZATION  09/13/2004   LAD:30%-40% in prox. to mid segment with 20% in the mid segment and 20% in left Circ., mild 20% right common iliac narrowing. medical therapy  . CARDIOVERSION  06/05/2017  . CARDIOVERSION N/A 11/11/2018   Procedure: CARDIOVERSION;  Surgeon: Pixie Casino, MD;  Location: [facility] ENDOSCOPY;  Service: Cardiovascular;  Laterality: N/A;   . CORONARY STENT INTERVENTION N/A 06/11/2017   Procedure: CORONARY STENT INTERVENTION;  Surgeon: Belva Crome, MD;  Location: [place] CV LAB;  Service: Cardiovascular;  Laterality: N/A;  . HERNIA REPAIR  5 yrs ago   right   . LEFT HEART CATH AND CORONARY ANGIOGRAPHY N/A 06/11/2017   Procedure: LEFT HEART CATH AND CORONARY ANGIOGRAPHY;  Surgeon: Belva Crome, MD;  Location: [place] CV LAB;  Service: Cardiovascular;  Laterality: N/A;  . LEFT HEART CATH AND CORONARY ANGIOGRAPHY N/A 08/18/2017   Procedure: LEFT HEART CATH AND CORONARY ANGIOGRAPHY;  Surgeon: Martinique, Peter M, MD;  Location: [place] CV LAB;  Service: Cardiovascular;  Laterality: N/A;  . NM MYOCAR PERF WALL MOTION  11/30/2007   protocol:Bruce, post EF 67%,mild perfusion defect seen in basal inferior consistant with Attenuation artifact, exercise cap. 12METS  . ROBOT ASSISTED LAPAROSCOPIC RADICAL PROSTATECTOMY  05/20/2012   Procedure: ROBOTIC ASSISTED LAPAROSCOPIC RADICAL PROSTATECTOMY LEVEL 1;  Surgeon: Dutch Gray, MD;  Location: [facility] ORS;  Service: Urology;  Laterality: N/A;     . TRANSURETHRAL RESECTION OF PROSTATE  82yr ago    Allergies  Allergen Reactions  . Gluten Meal Other (See Comments)    Celiac, stomach crams , gas  . Wheat Bran Other (See Comments)    Celiac   . Statins     Leg muscle pains - rosuvastatin, atorvastatin, zetia. Tolerates simvastatin  . Sulfa Antibiotics Rash    Current Outpatient Medications  Medication Sig Dispense Refill  . amLODipine (NORVASC) 2.5 MG tablet Take 1 tablet (2.5 mg total) by mouth daily. 90 tablet 3  . aspirin EC 81 MG tablet Take 1 tablet (81 mg total) by mouth daily. 90 tablet 3  . b complex vitamins tablet Take 1 tablet by mouth daily with supper.    . bisoprolol (ZEBETA) 5 MG tablet Take 2.5 mg by mouth daily.     . budesonide-formoterol (SYMBICORT) 80-4.5 MCG/ACT inhaler SMARTSIG:By Mouth    . dofetilide (TIKOSYN) 500 MCG capsule TAKE 1 CAPSULE TWICE A DAY 180  capsule 3  . ezetimibe (ZETIA) 10 MG tablet Take 10 mg by mouth every evening.     . fluocinonide cream (LIDEX) 06.38% Apply 1 application topically 2 (two) times daily.     . fluticasone (FLONASE) 50 MCG/ACT nasal spray Place 1 spray into  both nostrils daily.    Marland Kitchen guaiFENesin (MUCINEX) 600 MG 12 hr tablet Take 600 mg by mouth daily.    . Homeopathic Products (ZICAM COLD REMEDY PO) Take 1 tablet by mouth in the morning and at bedtime.    . Multiple Vitamin (MULTIVITAMIN WITH MINERALS) TABS tablet Take 1 tablet by mouth daily with supper.    . niacin (NIASPAN) 1000 MG CR tablet Take 1,000 mg by mouth at bedtime.     . nitroGLYCERIN (NITROSTAT) 0.4 MG SL tablet Place 1 tablet (0.4 mg total) under the tongue every 5 (five) minutes x 3 doses as needed for chest pain. 25 tablet 11  . pantoprazole (PROTONIX) 40 MG tablet Take 40 mg by mouth 2 (two) times daily.     . pravastatin (PRAVACHOL) 80 MG tablet Take 80 mg by mouth every evening.     Marland Kitchen PROAIR HFA 108 (90 BASE) MCG/ACT inhaler Take 2 puffs by mouth every 4 (four) hours as needed for wheezing or shortness of breath. For shortness of breath.    . rivaroxaban (XARELTO) 20 MG TABS tablet Take 1 tablet (20 mg total) by mouth daily with supper. 90 tablet 2  . Vitamin D, Ergocalciferol, 2000 units CAPS Take 2,000 Units by mouth at bedtime.      Current Facility-Administered Medications  Medication Dose Route Frequency Provider Last Rate Last Admin  . 0.9 %  sodium chloride infusion  500 mL Intravenous Once Irene Shipper, MD        Social History   Socioeconomic History  . Marital status: Married    Spouse name: Not on file  . Number of children: 3  . Years of education: Not on file  . Highest education level: Not on file  Occupational History    Employer: [employer]  Tobacco Use  . Smoking status: Never Smoker  . Smokeless tobacco: Never Used  Vaping Use  . Vaping Use: Never used  Substance and Sexual Activity  . Alcohol use: Not  Currently    Alcohol/week: 2.0 standard drinks    Types: 2 Glasses of wine per week  . Drug use: No  . Sexual activity: Yes    Partners: Female  Other Topics Concern  . Not on file  Social History Narrative  . Not on file   Social Determinants of Health   Financial Resource Strain: Not on file  Food Insecurity: Not on file  Transportation Needs: Not on file  Physical Activity: Not on file  Stress: Not on file  Social Connections: Not on file  Intimate Partner Violence: Not on file    Socially, he is a PhD and Is now retired from [employer].  He had been in the turf business and floral arrangements.  ROS General: Negative; No fevers, chills, or night sweats;  HEENT: Negative; No changes in vision or hearing, sinus congestion, difficulty swallowing Pulmonary: Negative; No cough, wheezing, shortness of breath, hemoptysis Cardiovascular: see HPI GI: Positive for celiac disease GU: Positive for erectile dysfunction since his robotic prostatic surgery No dysuria, hematuria, or difficulty voiding Musculoskeletal: Negative; no myalgias, joint pain, or weakness Hematologic/Oncology: Negative; no easy bruising, bleeding Endocrine: Negative; no heat/cold intolerance; no diabetes Neuro: Negative; no changes in balance, headaches Skin: Negative; No rashes or skin lesions Psychiatric: Negative; No behavioral problems, depression Sleep: Negative; No snoring, daytime sleepiness, hypersomnolence, bruxism, restless legs, hypnogognic hallucinations, no cataplexy Other comprehensive 14 point system review is negative.   Physical examination BP 120/60   Pulse 61  Ht 5' 8"  (1.727 m)   Wt 187 lb (84.8 kg)   SpO2 99%   BMI 28.43 kg/m     Repeat blood pressure by me was 120/64  Wt Readings from Last 3 Encounters:  03/28/20 187 lb (84.8 kg)  03/19/20 186 lb 6.4 oz (84.6 kg)  11/17/19 182 lb (82.6 kg)   General: Alert, oriented, no distress.  Skin: normal turgor, no rashes, warm and  dry HEENT: Normocephalic, atraumatic. Pupils equal round and reactive to light; sclera anicteric; extraocular muscles intact;  Nose without nasal septal hypertrophy Mouth/Parynx benign; Mallinpatti scale 3 Neck: No JVD, no carotid bruits; normal carotid upstroke Lungs: clear to ausculatation and percussion; no wheezing or rales Chest wall: without tenderness to palpitation Heart: PMI not displaced, RRR, s1 s2 normal, 1/6 systolic murmur, no diastolic murmur, no rubs, gallops, thrills, or heaves Abdomen: soft, nontender; no hepatosplenomehaly, BS+; abdominal aorta nontender and not dilated by palpation. Back: no CVA tenderness Pulses 2+ Musculoskeletal: full range of motion, normal strength, no joint deformities Extremities: no clubbing cyanosis or edema, Homan's sign negative  Neurologic: grossly nonfocal; Cranial nerves grossly wnl Psychologic: Normal mood and affect   ECG (independently read by me): NSR at 61; no ectopy; QTc 493  October 2020 ECG (independently read by me): NSR at 61; no ectopy: QTc 473 msec  March 2019 ECG (independently read by me): Sinus bradycardia 54 bpm.  No ectopy.  Normal intervals.  October 2019 ECG (independently read by me): Sinus bradycardia at 46 bpm.  No ectopy.  Normal intervals.  No ST segment abnormalities  May 2019 ECG (independently read by me): Normal sinus rhythm at 66 bpm.normal sinus rhythm at 66 bpm.  No ectopy.  Normal intervals.  No ST segment depression. No ectopy.  Normal intervals.  No ST segment depression.  July 02, 2017 ECG (independently read by me): sinus bradycardia 51 bpm.  No ST segment changes.  Normal intervals.  November 2018ECG (independently read by me): Sinus bradycardia 56 bpm.  No ectopy.  Normal intervals.  October 2017 ECG (independently read by me): Sinus bradycardia 59 bpm.  Normal intervals.  No ectopy.  May 2015 ECG (and apparently read by me): Normal sinus rhythm at 60 beats per minute.  Early transition.  No  ectopy.  PR interval 174 ms, QTc interval 460 ms.  ECG: (independently read by me): Sinus rhythm at 56 beats per minute. No ectopy. Normal intervals.  LABS:  BMP Latest Ref Rng & Units 03/19/2020 11/17/2019 08/02/2019  Glucose 70 - 99 mg/dL 114(H) 114(H) 114(H)  BUN 8 - 23 mg/dL 19 12 13   Creatinine 0.61 - 1.24 mg/dL 1.00 1.01 1.04  Sodium 135 - 145 mmol/L 137 138 139  Potassium 3.5 - 5.1 mmol/L 3.9 4.7 4.4  Chloride 98 - 111 mmol/L 105 107 107  CO2 22 - 32 mmol/L 21(L) 24 21(L)  Calcium 8.9 - 10.3 mg/dL 8.8(L) 8.9 9.1    Hepatic Function Panel     Component Value Date/Time   PROT 5.7 (L) 06/12/2017 0804     CBC Latest Ref Rng & Units 08/02/2019 11/01/2018 02/19/2018  WBC 4.0 - 10.5 K/uL 7.8 5.7 6.0  Hemoglobin 13.0 - 17.0 g/dL 14.7 15.9 16.3  Hematocrit 39.0 - 52.0 % 46.9 49.2 52.0  Platelets 150 - 400 K/uL 241 236 215    BNP No results found for: PROBNP  Lipid Panel     Component Value Date/Time   CHOL 122 06/10/2017 0108     RADIOLOGY:  No results found.  IMPRESSION:  1. CAD S/P percutaneous coronary angioplasty   2. Paroxysmal atrial fibrillation (HCC)   3. Anticoagulation adequate   4. History of prostate cancer   5. Pericarditis, resolved   6. Mixed hyperlipidemia   7. Therapeutic drug monitoring     ASSESSMENT AND PLAN: Mr. Moure Is a 69 year-old gentleman who had undergone cardiac catheterization in May 2006 and was found to have mild nonobstructive CAD.  He has a history of hyperlipidemia with very low HDL levels and has been on niacin in addition to his statin which previously was simvastatin 40 mg and now he is on pravastatin 80 mg daily followed by Dr. Sandi Mariscal.   He developed atrial fibrillation for which he was unaware following his robotic prostatectomy for his prostate cancer.  He was hospitalized in February 2019 with intermittent chest pain.  Due to worrisome symptoms of unstable angina, catheterization was performed and revealed a 95% mid LAD  stenosis in a calcified segment and mild additional concomitant CAD.  Initially he received triple therapy but ultimately aspirin was discontinued and he was maintained on Plavix and anticoagulation.Marland Kitchen  He developed repeat chest pain leading to repeat catheterization on August 18, 2017.  This chest pain was less intense character but was somewhat nitrate responsive.  He also took a Z-Pak for potential treatment of bronchitis.  He was diagnosed with acute pericarditis confirmed by MRI imaging September 2019. He was on Plavix in addition to Xarelto for his anticoagulation with his recent use of nonsteroidal anti-inflammatory medication and treatment with colchicine.  When I saw him in follow-up since it had been over 6 months since his stent placement I recommended he discontinue Plavix but resume a baby aspirin 81 mg and continue Xarelto.  He was on colchicine for at least 3 months duration.  Over time ultimately his pericarditis symptomatology ultimately resolved.  Unfortunately he has had continued experiences with atrial fibrillation and has required repeat cardioversion and due to recurrence was recently hospitalized and underwent successful Tikosyn loading at 500 mg twice a day with pharmacologic conversion back to sinus rhythm.  Since Tikosyn initiation, he admits to only 1 brief episode of atrial fibrillation which occurred in the setting while he was on prednisone in addition to antibiotics for sinus infection.  Otherwise he has been maintaining sinus rhythm.  His ECG today does show slight increase in his QTc interval at 493 milliseconds.  I have reviewed ECGs over the past year and on May 19, 2019 QTc interval was 492 ms.  On August 18, 2019, 445 ms, on November 17, 2019 458 ms, and more recently on March 19, 2020 463 ms.  He tells me he is scheduled to see Dr. Sandi Mariscal.  I will recommend that a follow-up ECG be obtained to make certain his QT interval is not further prolonging.  If so he will need to  follow-up with EP with probable dose reduction of Tikosyn to 250 twice daily.  He remains asymptomatic with reference to anginal symptoms and denies any recurrent pericarditis symptomatology.  I have renewed his sublingual nitroglycerin which had expired.  He has continued follow-up appointment scheduled in A. fib clinic.  I will see him in 1 year for reevaluation or sooner as needed.   Troy Sine, MD, [facility]  03/31/2020  3:10 PM

## 2020-03-31 ENCOUNTER — Encounter: Payer: Self-pay | Admitting: Cardiovascular Disease

## 2020-04-03 ENCOUNTER — Other Ambulatory Visit (HOSPITAL_COMMUNITY): Payer: Self-pay | Admitting: Physician Assistant

## 2020-06-22 ENCOUNTER — Telehealth (HOSPITAL_COMMUNITY): Payer: Self-pay | Admitting: Physician Assistant

## 2020-06-22 NOTE — Telephone Encounter (Signed)
agree

## 2020-06-22 NOTE — Telephone Encounter (Signed)
Incoming fax from PCP reviewed for QT on dofetilide. Manually calculated the QTc is 460-470 ms. Would recommend continuing present dose of dofetilide for now. F/u in AF clinic as scheduled for continued monitoring.

## 2020-07-16 ENCOUNTER — Other Ambulatory Visit (HOSPITAL_COMMUNITY): Payer: Self-pay | Admitting: *Deleted

## 2020-07-16 MED ORDER — DOFETILIDE 500 MCG PO CAPS: 500.0000 ug | ORAL_CAPSULE | Freq: Two times a day (BID) | ORAL | 2 refills | Status: DC

## 2020-08-21 ENCOUNTER — Other Ambulatory Visit: Payer: Medicare Other

## 2020-08-21 ENCOUNTER — Other Ambulatory Visit: Payer: Self-pay | Admitting: Family Medicine

## 2020-08-21 DIAGNOSIS — R059 Cough, unspecified: Secondary | ICD-10-CM

## 2020-08-23 ENCOUNTER — Ambulatory Visit
Admission: RE | Admit: 2020-08-23 | Discharge: 2020-08-23 | Disposition: A | Discharge status: Home / Self Care | Payer: Medicare Other | Source: Ambulatory Visit | Attending: Family Medicine | Admitting: Family Medicine

## 2020-08-23 ENCOUNTER — Other Ambulatory Visit: Payer: Self-pay

## 2020-08-23 DIAGNOSIS — R059 Cough, unspecified: Secondary | ICD-10-CM

## 2020-09-24 ENCOUNTER — Encounter (HOSPITAL_COMMUNITY): Payer: Self-pay | Admitting: Physician Assistant

## 2020-09-24 ENCOUNTER — Ambulatory Visit (HOSPITAL_COMMUNITY)
Admission: RE | Admit: 2020-09-24 | Discharge: 2020-09-24 | Disposition: A | Discharge status: Home / Self Care | Payer: Medicare Other | Source: Ambulatory Visit | Attending: Physician Assistant | Admitting: Physician Assistant

## 2020-09-24 ENCOUNTER — Other Ambulatory Visit: Payer: Self-pay

## 2020-09-24 VITALS — BP 114/62 | HR 61 | Ht 68.0 in | Wt 181.6 lb

## 2020-09-24 DIAGNOSIS — Z7901 Long term (current) use of anticoagulants: Secondary | ICD-10-CM | POA: Insufficient documentation

## 2020-09-24 DIAGNOSIS — Z881 Allergy status to other antibiotic agents status: Secondary | ICD-10-CM | POA: Diagnosis not present

## 2020-09-24 DIAGNOSIS — Z882 Allergy status to sulfonamides status: Secondary | ICD-10-CM | POA: Insufficient documentation

## 2020-09-24 DIAGNOSIS — I4819 Other persistent atrial fibrillation: Secondary | ICD-10-CM

## 2020-09-24 DIAGNOSIS — Z7951 Long term (current) use of inhaled steroids: Secondary | ICD-10-CM | POA: Diagnosis not present

## 2020-09-24 DIAGNOSIS — Z7982 Long term (current) use of aspirin: Secondary | ICD-10-CM | POA: Insufficient documentation

## 2020-09-24 DIAGNOSIS — Z8546 Personal history of malignant neoplasm of prostate: Secondary | ICD-10-CM | POA: Diagnosis not present

## 2020-09-24 DIAGNOSIS — Z79899 Other long term (current) drug therapy: Secondary | ICD-10-CM | POA: Insufficient documentation

## 2020-09-24 DIAGNOSIS — D6869 Other thrombophilia: Secondary | ICD-10-CM

## 2020-09-24 DIAGNOSIS — I2510 Atherosclerotic heart disease of native coronary artery without angina pectoris: Secondary | ICD-10-CM | POA: Diagnosis not present

## 2020-09-24 DIAGNOSIS — Z955 Presence of coronary angioplasty implant and graft: Secondary | ICD-10-CM | POA: Insufficient documentation

## 2020-09-24 DIAGNOSIS — Z888 Allergy status to other drugs, medicaments and biological substances status: Secondary | ICD-10-CM | POA: Diagnosis not present

## 2020-09-24 DIAGNOSIS — E785 Hyperlipidemia, unspecified: Secondary | ICD-10-CM | POA: Diagnosis not present

## 2020-09-24 LAB — BASIC METABOLIC PANEL
Anion gap: 7 (ref 5–15)
BUN: 9 mg/dL (ref 8–23)
CO2: 22 mmol/L (ref 22–32)
Calcium: 8.5 mg/dL — ABNORMAL LOW (ref 8.9–10.3)
Chloride: 108 mmol/L (ref 98–111)
Creatinine, Ser: 0.96 mg/dL (ref 0.61–1.24)
GFR, Estimated: >60 mL/min (ref >60)
Glucose, Bld: 124 mg/dL — ABNORMAL HIGH (ref 70–99)
Potassium: 4 mmol/L (ref 3.5–5.1)
Sodium: 137 mmol/L (ref 135–145)

## 2020-09-24 LAB — MAGNESIUM: Magnesium: 2.3 mg/dL (ref 1.7–2.4)

## 2020-09-24 NOTE — Progress Notes (Signed)
Primary Care Physician: Derinda Late, MD Referring Physician: [facility] F/u Primary Cardiologist: [name] Primary EP: Dr Becky Sax Jose Warner is a 70 y.o. male with a h/o persistent afib, HLD, CAD, and prostate cancer who presents to the AF Clinic for follow up. Patient loaded on dofetilide 9/15-9/18/20. He converted to SR on the medication and did not require DCCV. He is on Xarelto for a CHADS2VASC score of 2. Patient was seen in the ER on 08/02/19 for acute CP. He was diagnosed with esophageal candidiasis (he had recently been on steroids and abx for a sinus infection). His CP resolved with GI cocktail.   On follow up today, patient reports that he has done well since his last visit. He denies any heart racing or palpitations. He denies any bleeding issues on anticoagulation.   Today, he denies symptoms of palpitations, SOB, orthopnea, PND, lower extremity edema, presyncope, syncope, or neurologic sequela. The patient is tolerating medications without difficulties and is otherwise without complaint today.   Past Medical History:  Diagnosis Date  . Anemia   . Bronchitis 05-10-12   past fall- 4 runs antibiotics due to bronchitis-uses [employer] as needed  . Celiac disease   . Colon polyps    adenomatous  . Coronary artery disease   . Diverticulosis   . GERD (gastroesophageal reflux disease)   . Hepatitis 05-10-12   "was told non A, non B"-unclean dental equip.  Marland Kitchen Hyperlipidemia   . Iron deficiency anemia   . Pericarditis   . Persistent atrial fibrillation ([place]) 06/05/2017  . Prostate cancer ([place]) 05-10-12   ;bx. 04-12-12, dx  . Spasm of esophagus 2013   Past Surgical History:  Procedure Laterality Date  . CARDIAC CATHETERIZATION  09/13/2004   LAD:30%-40% in prox. to mid segment with 20% in the mid segment and 20% in left Circ., mild 20% right common iliac narrowing. medical therapy  . CARDIOVERSION  06/05/2017  . CARDIOVERSION N/A 11/11/2018   Procedure:  CARDIOVERSION;  Surgeon: Pixie Casino, MD;  Location: [facility] ENDOSCOPY;  Service: Cardiovascular;  Laterality: N/A;  . CORONARY STENT INTERVENTION N/A 06/11/2017   Procedure: CORONARY STENT INTERVENTION;  Surgeon: Belva Crome, MD;  Location: [place] CV LAB;  Service: Cardiovascular;  Laterality: N/A;  . HERNIA REPAIR  5 yrs ago   right   . LEFT HEART CATH AND CORONARY ANGIOGRAPHY N/A 06/11/2017   Procedure: LEFT HEART CATH AND CORONARY ANGIOGRAPHY;  Surgeon: Belva Crome, MD;  Location: [place] CV LAB;  Service: Cardiovascular;  Laterality: N/A;  . LEFT HEART CATH AND CORONARY ANGIOGRAPHY N/A 08/18/2017   Procedure: LEFT HEART CATH AND CORONARY ANGIOGRAPHY;  Surgeon: Martinique, Peter M, MD;  Location: [place] CV LAB;  Service: Cardiovascular;  Laterality: N/A;  . NM MYOCAR PERF WALL MOTION  11/30/2007   protocol:Bruce, post EF 67%,mild perfusion defect seen in basal inferior consistant with Attenuation artifact, exercise cap. 12METS  . ROBOT ASSISTED LAPAROSCOPIC RADICAL PROSTATECTOMY  05/20/2012   Procedure: ROBOTIC ASSISTED LAPAROSCOPIC RADICAL PROSTATECTOMY LEVEL 1;  Surgeon: Dutch Gray, MD;  Location: [facility] OR[facility];  Service: Urology;  Laterality: N/A;     . TRANSURETHRAL RESECTION OF PROSTATE  67yr ago    Current Outpatient Medications  Medication Sig Dispense Refill  . amLODipine (NORVASC) 2.5 MG tablet Take 1 tablet (2.5 mg total) by mouth daily. 90 tablet 3  . aspirin EC 81 MG tablet Take 1 tablet (81 mg total) by mouth daily. 90 tablet  3  . b complex vitamins tablet Take 1 tablet by mouth daily with supper.    . bisoprolol (ZEBETA) 5 MG tablet Take 2.5 mg by mouth daily.     . budesonide-formoterol (SYMBICORT) 80-4.5 MCG/ACT inhaler SMARTSIG:By Mouth    . dofetilide (TIKOSYN) 500 MCG capsule Take 1 capsule (500 mcg total) by mouth 2 (two) times daily. 180 capsule 2  . ezetimibe (ZETIA) 10 MG tablet Take 10 mg by mouth every evening.     . fluocinonide cream (LIDEX) 0.38 % Apply  1 application topically 2 (two) times daily.     . fluticasone (FLONASE) 50 MCG/ACT nasal spray Place 1 spray into both nostrils daily.    Marland Kitchen guaiFENesin (MUCINEX) 600 MG 12 hr tablet Take 600 mg by mouth daily.    . Homeopathic Products (ZICAM COLD REMEDY PO) Take 1 tablet by mouth in the morning and at bedtime.    . Multiple Vitamin (MULTIVITAMIN WITH MINERALS) TABS tablet Take 1 tablet by mouth daily with supper.    . nitroGLYCERIN (NITROSTAT) 0.4 MG SL tablet Place 1 tablet (0.4 mg total) under the tongue every 5 (five) minutes x 3 doses as needed for chest pain. 25 tablet 11  . pantoprazole (PROTONIX) 40 MG tablet Take 40 mg by mouth 2 (two) times daily.     . pravastatin (PRAVACHOL) 80 MG tablet Take 80 mg by mouth every evening.     Marland Kitchen PROAIR HFA 108 (90 BASE) MCG/ACT inhaler Take 2 puffs by mouth every 4 (four) hours as needed for wheezing or shortness of breath. For shortness of breath.    . Vitamin D, Ergocalciferol, 2000 units CAPS Take 2,000 Units by mouth at bedtime.     Alveda Reasons 20 MG TABS tablet TAKE 1 TABLET DAILY WITH SUPPER 90 tablet 3   Current Facility-Administered Medications  Medication Dose Route Frequency Provider Last Rate Last Admin  . 0.9 %  sodium chloride infusion  500 mL Intravenous Once Irene Shipper, MD        Allergies  Allergen Reactions  . Doxycycline Hives  . Gluten Meal Other (See Comments)    Celiac, stomach crams , gas  . Wheat Bran Other (See Comments)    Celiac   . Statins     Leg muscle pains - rosuvastatin, atorvastatin, zetia. Tolerates simvastatin  . Sulfa Antibiotics Rash    Social History   Socioeconomic History  . Marital status: Married    Spouse name: Not on file  . Number of children: 3  . Years of education: Not on file  . Highest education level: Not on file  Occupational History    Employer: [employer]  Tobacco Use  . Smoking status: Never Smoker  . Smokeless tobacco: Never Used  Vaping Use  . Vaping Use: Never used   Substance and Sexual Activity  . Alcohol use: Not Currently    Alcohol/week: 2.0 standard drinks    Types: 2 Glasses of wine per week  . Drug use: No  . Sexual activity: Yes    Partners: Female  Other Topics Concern  . Not on file  Social History Narrative  . Not on file   Social Determinants of Health   Financial Resource Strain: Not on file  Food Insecurity: Not on file  Transportation Needs: Not on file  Physical Activity: Not on file  Stress: Not on file  Social Connections: Not on file  Intimate Partner Violence: Not on file    Family History  Problem  Relation Age of Onset  . Stroke Mother   . Heart disease Father   . Heart attack Father   . Cancer Sister   . Stroke Maternal Grandfather   . Cancer Paternal Grandmother   . Heart attack Paternal Grandfather   . Heart disease Paternal Grandfather   . Colon cancer Neg Hx   . Esophageal cancer Neg Hx   . Pancreatic cancer Neg Hx   . Rectal cancer Neg Hx   . Stomach cancer Neg Hx     ROS- All systems are reviewed and negative except as per the HPI above  Physical Exam: Vitals:   09/24/20 0832  BP: 114/62  Pulse: 61  Weight: 82.4 kg  Height: 5' 8"  (1.727 m)   Wt Readings from Last 3 Encounters:  09/24/20 82.4 kg  03/28/20 84.8 kg  03/19/20 84.6 kg    Labs: Lab Results  Component Value Date   NA 137 03/19/2020   K 3.9 03/19/2020   CL 105 03/19/2020   CO2 21 (L) 03/19/2020   GLUCOSE 114 (H) 03/19/2020   BUN 19 03/19/2020   CREATININE 1.00 03/19/2020   CALCIUM 8.8 (L) 03/19/2020   PHOS 3.7 12/28/2017   MG 2.1 03/19/2020   Lab Results  Component Value Date   INR 1.18 06/11/2017   Lab Results  Component Value Date   CHOL 122 06/10/2017   HDL 41 06/10/2017   LDLCALC 60 06/10/2017   TRIG 103 06/10/2017    GEN- The patient is a well appearing male, alert and oriented x 3 today.   HEENT-head normocephalic, atraumatic, sclera clear, conjunctiva pink, hearing intact, trachea midline. Lungs-  Clear to ausculation bilaterally, normal work of breathing Heart- Regular rate and rhythm, no murmurs, rubs or gallops  GI- soft, NT, ND, + BS Extremities- no clubbing, cyanosis, or edema MS- no significant deformity or atrophy Skin- no rash or lesion Psych- euthymic mood, full affect Neuro- strength and sensation are intact   EKG today demonstrates SR Vent. rate 61 BPM PR interval 170 ms QRS duration 88 ms QT/QTcB 440/442 ms  Epic records reviewed  Cardiac CT- 9/10-IMPRESSION: 1. Normal left ventricular size, thickness and hyperdynamic systolic function (LVEF = 71%). There are no regional wall motion abnormalities. There is no late gadolinium enhancement in the left ventricular myocardium. 2. Normal right ventricular size, thickness and systolic function (LVEF = 73%). There are no regional wall motion abnormalities. 3. Mildly dilated left atrium. There is atypical LGE of the left and right atrial walls suspicious for fibrosis. 4. Normal size of the aortic root, ascending aorta and pulmonary artery. 5.  Mild aortic and mitral and trivial tricuspid regurgitation. 6. No pericardial effusion. There is mild late gadolinium enhancement adjacent to the inferolateral and anterior left ventricular walls and RV free wall.  Collectively, these findings are consistent with an acute pericarditis. LVEF has improved when compared to the prior echocardiogram on 06/10/2017 from 50-55% to 71%.  Echo 01/07/19 demonstrated  1. Left ventricular ejection fraction, by visual estimation, is 55 to 60%. The left ventricle has normal function. Normal left ventricular size. There is mildly increased left ventricular hypertrophy.  2. Elevated left ventricular end-diastolic pressure.  3. Left ventricular diastolic Doppler parameters are consistent with pseudonormalization pattern of LV diastolic filling.  4. Global right ventricle has normal systolic function.The right ventricular size is normal. No  increase in right ventricular wall thickness.  5. Left atrial size was moderately dilated.  6. Right atrial size was normal.  7.  The mitral valve is normal in structure. Mild mitral valve regurgitation. No evidence of mitral stenosis.  8. The tricuspid valve is normal in structure. Tricuspid valve regurgitation is mild.  9. The aortic valve is normal in structure. Aortic valve regurgitation is mild to moderate by color flow Doppler. Structurally normal aortic valve, with no evidence of sclerosis or stenosis. 10. The pulmonic valve was normal in structure. Pulmonic valve regurgitation is not visualized by color flow Doppler. 11. The inferior vena cava is normal in size with greater than 50% respiratory variability, suggesting right atrial pressure of 3 mmHg.   Assessment and Plan: 1. Persistent atrial fibrillation S/p dofetilide loading 9/15-9/18/20. Patient appears to be maintaining SR. Check bmet/mag Continue dofetilide 500 mcg BID. QT stable. Continue Xarelto 20 mg daily Continue bisoprolol 2.5 mg daily.  This patients CHA2DS2-VASc Score and unadjusted Ischemic Stroke Rate (% per year) is equal to 2.2 % stroke rate/year from a score of 2  Above score calculated as 1 point each if present [CHF, HTN, DM, Vascular=MI/PAD/Aortic Plaque, Age if 65-74, or Male] Above score calculated as 2 points each if present [Age > 75, or Stroke/TIA/TE]  2. CAD DES to LAD 06/11/17. No anginal symptoms.   Follow up with [name] per recall. AF clinic in one year.    [facility] [address] [place], [place] [address] [phone]

## 2020-12-04 ENCOUNTER — Emergency Department (HOSPITAL_BASED_OUTPATIENT_CLINIC_OR_DEPARTMENT_OTHER): Payer: Medicare Other

## 2020-12-04 ENCOUNTER — Emergency Department (HOSPITAL_BASED_OUTPATIENT_CLINIC_OR_DEPARTMENT_OTHER)
Admission: EM | Admit: 2020-12-04 | Discharge: 2020-12-04 | Disposition: A | Discharge status: Home / Self Care | Payer: Medicare Other | Attending: Emergency Medicine | Admitting: Emergency Medicine

## 2020-12-04 ENCOUNTER — Encounter (HOSPITAL_BASED_OUTPATIENT_CLINIC_OR_DEPARTMENT_OTHER): Payer: Self-pay | Admitting: *Deleted

## 2020-12-04 ENCOUNTER — Other Ambulatory Visit: Payer: Self-pay

## 2020-12-04 DIAGNOSIS — I480 Paroxysmal atrial fibrillation: Secondary | ICD-10-CM | POA: Diagnosis not present

## 2020-12-04 DIAGNOSIS — Z7951 Long term (current) use of inhaled steroids: Secondary | ICD-10-CM | POA: Insufficient documentation

## 2020-12-04 DIAGNOSIS — J45909 Unspecified asthma, uncomplicated: Secondary | ICD-10-CM | POA: Insufficient documentation

## 2020-12-04 DIAGNOSIS — Z8546 Personal history of malignant neoplasm of prostate: Secondary | ICD-10-CM | POA: Insufficient documentation

## 2020-12-04 DIAGNOSIS — Z7901 Long term (current) use of anticoagulants: Secondary | ICD-10-CM | POA: Diagnosis not present

## 2020-12-04 DIAGNOSIS — I2510 Atherosclerotic heart disease of native coronary artery without angina pectoris: Secondary | ICD-10-CM | POA: Insufficient documentation

## 2020-12-04 DIAGNOSIS — R0602 Shortness of breath: Secondary | ICD-10-CM | POA: Insufficient documentation

## 2020-12-04 DIAGNOSIS — R0789 Other chest pain: Secondary | ICD-10-CM | POA: Insufficient documentation

## 2020-12-04 DIAGNOSIS — Z7982 Long term (current) use of aspirin: Secondary | ICD-10-CM | POA: Diagnosis not present

## 2020-12-04 DIAGNOSIS — R079 Chest pain, unspecified: Secondary | ICD-10-CM | POA: Diagnosis present

## 2020-12-04 LAB — TROPONIN I (HIGH SENSITIVITY)
Troponin I (High Sensitivity): 7 ng/L (ref <18)
Troponin I (High Sensitivity): 9 ng/L (ref <18)

## 2020-12-04 LAB — CBC
HCT: 42 % (ref 39.0–52.0)
Hemoglobin: 14.1 g/dL (ref 13.0–17.0)
MCH: 29 pg (ref 26.0–34.0)
MCHC: 33.6 g/dL (ref 30.0–36.0)
MCV: 86.4 fL (ref 80.0–100.0)
Platelets: 238 10*3/uL (ref 150–400)
RBC: 4.86 MIL/uL (ref 4.22–5.81)
RDW: 13.2 % (ref 11.5–15.5)
WBC: 6.5 10*3/uL (ref 4.0–10.5)
nRBC: 0 % (ref 0.0–0.2)

## 2020-12-04 LAB — BASIC METABOLIC PANEL
Anion gap: 7 (ref 5–15)
BUN: 15 mg/dL (ref 8–23)
CO2: 23 mmol/L (ref 22–32)
Calcium: 8.8 mg/dL — ABNORMAL LOW (ref 8.9–10.3)
Chloride: 106 mmol/L (ref 98–111)
Creatinine, Ser: 1.19 mg/dL (ref 0.61–1.24)
GFR, Estimated: >60 mL/min (ref >60)
Glucose, Bld: 95 mg/dL (ref 70–99)
Potassium: 3.9 mmol/L (ref 3.5–5.1)
Sodium: 136 mmol/L (ref 135–145)

## 2020-12-04 MED ORDER — ISOSORBIDE MONONITRATE ER 30 MG PO TB24
30.0000 mg | ORAL_TABLET | Freq: Every day | ORAL | 0 refills | Status: DC
Start: 2020-12-04 — End: 2020-12-12

## 2020-12-04 MED ORDER — ASPIRIN 81 MG PO CHEW
162.0000 mg | CHEWABLE_TABLET | Freq: Once | ORAL | Status: AC
  Administered 2020-12-04: 162 mg via ORAL
  Filled 2020-12-04: qty 2

## 2020-12-04 NOTE — ED Triage Notes (Signed)
C/o  mid sternal chest pain x 1 hr , denies SOB ,n/v

## 2020-12-04 NOTE — ED Notes (Signed)
Patient transported to radiology

## 2020-12-04 NOTE — ED Provider Notes (Signed)
[place] EMERGENCY DEPARTMENT Provider Note   CSN: [id number] Arrival date & time: 12/04/20  1722     History Chief Complaint  Patient presents with   Chest Pain    Jose Warner is a 70 y.o. male.  HPI   Pt is a 70 y/o male with a h/o anemia, bronchitis, celiac disease, CAD, diverticulosis, GERD, HLD, pericarditis, persistent afib, prostate ca, spasm of esophagus, who presents to the ED today for eval of midsternal nonradiating chest pain that started at 3pm. States pain started when he was lifting a shovel full of trash.  States it has been waxing and waning. Initially pain was sharp but now feels like a heaviness/like a push. States it feels like when he had to get a stent in the past. Reports his sob is at baseline. Denies diaphoresis, nausea, lightheadedness, dizziness. Denies any leg swelling or pain.   States a few days ago he was having some left sided chest pain that was intermittent. He states he has not felt well since.   Past Medical History:  Diagnosis Date   Anemia    Bronchitis 05-10-12   past fall- 4 runs antibiotics due to bronchitis-uses [employer] as needed   Celiac disease    Colon polyps    adenomatous   Coronary artery disease    Diverticulosis    GERD (gastroesophageal reflux disease)    Hepatitis 05-10-12   "was told non A, non B"-unclean dental equip.   Hyperlipidemia    Iron deficiency anemia    Pericarditis    Persistent atrial fibrillation ([facility]) 06/05/2017   Prostate cancer ([place]) 05-10-12   ;bx. 04-12-12, dx   Spasm of esophagus 2013    Patient Active Problem List   Diagnosis Date Noted   Secondary hypercoagulable state ([place]) 05/19/2019   Persistent atrial fibrillation ([place]) 01/04/2019   Chronic anticoagulation 08/17/2017   Asthma 08/17/2017   Unstable angina ([place]) 08/17/2017   ACS (acute coronary syndrome) ([place]) 06/14/2017   GERD (gastroesophageal reflux disease) 06/10/2017   Chest pain 06/09/2017   Erectile dysfunction  following radical prostatectomy 06/08/2013   FHx: coronary artery disease 11/26/2012   H/O prostate cancer 11/26/2012   Low HDL (under 40) 11/26/2012   Paroxysmal atrial fibrillation ([place]) 11/26/2012   CAD S/P percutaneous coronary angioplasty 11/26/2012    Past Surgical History:  Procedure Laterality Date   CARDIAC CATHETERIZATION  09/13/2004   LAD:30%-40% in prox. to mid segment with 20% in the mid segment and 20% in left Circ., mild 20% right common iliac narrowing. medical therapy   CARDIOVERSION  06/05/2017   CARDIOVERSION N/A 11/11/2018   Procedure: CARDIOVERSION;  Surgeon: Pixie Casino, MD;  Location: [place];  Service: Cardiovascular;  Laterality: N/A;   CORONARY STENT INTERVENTION N/A 06/11/2017   Procedure: CORONARY STENT INTERVENTION;  Surgeon: Belva Crome, MD;  Location: [place] CV LAB;  Service: Cardiovascular;  Laterality: N/A;   HERNIA REPAIR  5 yrs ago   right    LEFT HEART CATH AND CORONARY ANGIOGRAPHY N/A 06/11/2017   Procedure: LEFT HEART CATH AND CORONARY ANGIOGRAPHY;  Surgeon: Belva Crome, MD;  Location: [place] CV LAB;  Service: Cardiovascular;  Laterality: N/A;   LEFT HEART CATH AND CORONARY ANGIOGRAPHY N/A 08/18/2017   Procedure: LEFT HEART CATH AND CORONARY ANGIOGRAPHY;  Surgeon: Martinique, Peter M, MD;  Location: [place] CV LAB;  Service: Cardiovascular;  Laterality: N/A;   NM MYOCAR PERF WALL MOTION  11/30/2007   protocol:Bruce, post EF  67%,mild perfusion defect seen in basal inferior consistant with Attenuation artifact, exercise cap. 12METS   ROBOT ASSISTED LAPAROSCOPIC RADICAL PROSTATECTOMY  05/20/2012   Procedure: ROBOTIC ASSISTED LAPAROSCOPIC RADICAL PROSTATECTOMY LEVEL 1;  Surgeon: Dutch Gray, MD;  Location: [facility];  Service: Urology;  Laterality: N/A;      TRANSURETHRAL RESECTION OF PROSTATE  30yr ago       Family History  Problem Relation Age of Onset   Stroke Mother    Heart disease Father    Heart attack Father    Cancer  Sister    Stroke Maternal Grandfather    Cancer Paternal Grandmother    Heart attack Paternal Grandfather    Heart disease Paternal Grandfather    Colon cancer Neg Hx    Esophageal cancer Neg Hx    Pancreatic cancer Neg Hx    Rectal cancer Neg Hx    Stomach cancer Neg Hx     Social History   Tobacco Use   Smoking status: Never   Smokeless tobacco: Never  Vaping Use   Vaping Use: Never used  Substance Use Topics   Alcohol use: Not Currently    Alcohol/week: 2.0 standard drinks    Types: 2 Glasses of wine per week   Drug use: No    Home Medications Prior to Admission medications   Medication Sig Start Date End Date Taking? Authorizing Provider  isosorbide mononitrate (IMDUR) 30 MG 24 hr tablet Take 1 tablet (30 mg total) by mouth daily. 12/04/20 01/03/21 Yes Anna Livers S, PA-C  amLODipine (NORVASC) 2.5 MG tablet Take 1 tablet (2.5 mg total) by mouth daily. 08/31/17 03/19/20  KTroy Sine MD  aspirin EC 81 MG tablet Take 1 tablet (81 mg total) by mouth daily. 01/19/18   KTroy Sine MD  b complex vitamins tablet Take 1 tablet by mouth daily with supper.    [provider]  bisoprolol (ZEBETA) 5 MG tablet Take 2.5 mg by mouth daily.     [provider]  budesonide-formoterol (SYMBICORT) 80-4.5 MCG/ACT inhaler SMARTSIG:By Mouth 03/06/20   [provider]  dofetilide (TIKOSYN) 500 MCG capsule Take 1 capsule (500 mcg total) by mouth 2 (two) times daily. 07/16/20   Fenton, Clint R, PA  ezetimibe (ZETIA) 10 MG tablet Take 10 mg by mouth every evening.     [provider]  fluocinonide cream (LIDEX) 02.09% Apply 1 application topically 2 (two) times daily.  05/30/19   [provider]  fluticasone (FLONASE) 50 MCG/ACT nasal spray Place 1 spray into both nostrils daily.    [provider]  guaiFENesin (MUCINEX) 600 MG 12 hr tablet Take 600 mg by mouth daily.    [provider]  Homeopathic Products ([facility] REMEDY PO)  Take 1 tablet by mouth in the morning and at bedtime.    [provider]  Multiple Vitamin (MULTIVITAMIN WITH MINERALS) TABS tablet Take 1 tablet by mouth daily with supper.    [provider]  nitroGLYCERIN (NITROSTAT) 0.4 MG SL tablet Place 1 tablet (0.4 mg total) under the tongue every 5 (five) minutes x 3 doses as needed for chest pain. 03/28/20   KTroy Sine MD  pantoprazole (PROTONIX) 40 MG tablet Take 40 mg by mouth 2 (two) times daily.  06/24/17   [provider]  pravastatin (PRAVACHOL) 80 MG tablet Take 80 mg by mouth every evening.     [provider]  PROAIR HFA 108 (90 BASE) MCG/ACT inhaler Take 2 puffs  by mouth every 4 (four) hours as needed for wheezing or shortness of breath. For shortness of breath. 02/13/12   [provider]  Vitamin D, Ergocalciferol, 2000 units CAPS Take 2,000 Units by mouth at bedtime.     [provider]  XARELTO 20 MG TABS tablet TAKE 1 TABLET DAILY WITH SUPPER 04/03/20   Fenton, Clint R, PA    Allergies    Doxycycline, Gluten meal, Wheat bran, Statins, and Sulfa antibiotics  Review of Systems   Review of Systems  Constitutional:  Negative for diaphoresis and fever.  HENT:  Negative for ear pain and sore throat.   Eyes:  Negative for pain and visual disturbance.  Respiratory:  Negative for cough and shortness of breath.   Cardiovascular:  Positive for chest pain. Negative for leg swelling.  Gastrointestinal:  Negative for abdominal pain, constipation, diarrhea, nausea and vomiting.  Genitourinary:  Negative for dysuria and hematuria.  Musculoskeletal:  Negative for back pain.  Skin:  Negative for color change and rash.  Neurological:  Negative for dizziness and light-headedness.  All other systems reviewed and are negative.  Physical Exam Updated Vital Signs BP (!) 144/76 (BP Location: Right Arm)   Pulse (!) 57   Temp 97.7 F (36.5 C) (Oral)   Resp 18   Ht 5' 8"  (1.727 m)   Wt 81.6 kg    SpO2 97%   BMI 27.37 kg/m   Physical Exam Vitals and nursing note reviewed.  Constitutional:      Appearance: He is well-developed.  HENT:     Head: Normocephalic and atraumatic.  Eyes:     Conjunctiva/sclera: Conjunctivae normal.  Cardiovascular:     Rate and Rhythm: Normal rate and regular rhythm.     Heart sounds: Normal heart sounds. No murmur heard. Pulmonary:     Effort: Pulmonary effort is normal. No respiratory distress.     Breath sounds: Rales (faint bibasilar rales) present. No decreased breath sounds, wheezing or rhonchi.  Abdominal:     Palpations: Abdomen is soft.     Tenderness: There is no abdominal tenderness. There is no guarding or rebound.  Musculoskeletal:     Cervical back: Neck supple.     Right lower leg: No tenderness. No edema.     Left lower leg: No tenderness. No edema.  Skin:    General: Skin is warm and dry.  Neurological:     Mental Status: He is alert.    ED Results / Procedures / Treatments   Labs (all labs ordered are listed, but only abnormal results are displayed) Labs Reviewed  BASIC METABOLIC PANEL - Abnormal; Notable for the following components:      Result Value   Calcium 8.8 (*)    All other components within normal limits  CBC  TROPONIN I (HIGH SENSITIVITY)  TROPONIN I (HIGH SENSITIVITY)    EKG EKG Interpretation  Date/Time:  Tuesday December 04 2020 17:30:57 EDT Ventricular Rate:  75 PR Interval:  160 QRS Duration: 88 QT Interval:  422 QTC Calculation: 471 R Axis:   22 Text Interpretation: Normal sinus rhythm Normal ECG NSR, similar to previous Confirmed by Lavenia Atlas [phone]) on 12/04/2020 7:16:28 PM  Radiology DG Chest 2 View  Result Date: 12/04/2020 CLINICAL DATA:  Chest pain for 1 hour EXAM: CHEST - 2 VIEW COMPARISON:  08/23/2020 FINDINGS: Cardiac shadow is within normal limits. The lungs are well aerated bilaterally. No focal infiltrate or effusion is seen. No bony abnormality is noted. IMPRESSION: No active  cardiopulmonary disease. Electronically Signed   By: Inez Catalina M.D.   On: 12/04/2020 17:48    Procedures Procedures   9:30 PM Cardiac monitoring reveals NSR, HR 57 (Rate & rhythm), as reviewed and interpreted by me. Cardiac monitoring was ordered due to chest pain and to monitor patient for dysrhythmia.   Medications Ordered in ED Medications  aspirin chewable tablet 162 mg (162 mg Oral Given 12/04/20 1834)    ED Course  I have reviewed the triage vital signs and the nursing notes.  Pertinent labs & imaging results that were available during my care of the patient were reviewed by me and considered in my medical decision making (see chart for details).    MDM Rules/Calculators/A&P                          70 y/o M presenting for eval of chest pain  Reviewed/interpreted labs CBC wnl BMP wnl Trop neg, delta trop is negative  EKG - no ischemic changes   Reviewed/interpreted imaging CXR -  No active cardiopulmonary disease  Reviewed records. Last cath 07/2017 showed nonobstructive CAD with patent stent to the LAD.  On reassessment patient is completely pain-free, given his work-up here in the emergency department we will discuss with cardiology.  9:02 PM CONSULT with Dr. Tenna Child with cardiology who recommends starting the patient on sublingual nitroglycerin if he is not currently on it.  Recommended starting him on 30 mg of Imdur once daily.  He feels that the patient does not need to be admitted given that his tropes have ruled out and his EKG does not have any ischemic changes.  Feels he is appropriate to follow-up with outpatient cardiology within the next 1 to 2 weeks.    Reassessed patient.  He is in no acute distress.  Informed him of the conversation with cardiology and plan to start him on Imdur and have him follow-up with cardiology and plan for d/c with imdur. He already has ntg at home. Have advised that if his symptoms regress he needs to return to the emergency  department immediately.  He voiced understanding the plan and reasons to return.  All questions answered. patient stable for discharge.  Final Clinical Impression(s) / ED Diagnoses Final diagnoses:  Atypical chest pain    Rx / DC Orders ED Discharge Orders          Ordered    isosorbide mononitrate (IMDUR) 30 MG 24 hr tablet  Daily        12/04/20 2129             Markeria Goetsch S, PA-C 12/04/20 2130    Lorelle Gibbs, DO 12/05/20 1945

## 2020-12-04 NOTE — Discharge Instructions (Addendum)
Take Imdur daily.    You will need to call your cardiologist tomorrow to schedule an appointment within the next 1 to 2 weeks.  Again if your symptoms progress in the meantime you will need to be seen in the emergency department immediately.

## 2020-12-04 NOTE — ED Notes (Signed)
Patient transported to X-ray 

## 2020-12-04 NOTE — ED Notes (Signed)
Pt took home med Tikosyn 500 mcg

## 2020-12-11 ENCOUNTER — Encounter (HOSPITAL_COMMUNITY): Payer: Self-pay | Admitting: Emergency Medicine

## 2020-12-11 ENCOUNTER — Other Ambulatory Visit: Payer: Self-pay

## 2020-12-11 ENCOUNTER — Telehealth: Payer: Self-pay | Admitting: Cardiovascular Disease

## 2020-12-11 ENCOUNTER — Observation Stay (HOSPITAL_COMMUNITY)
Admission: EM | Admit: 2020-12-11 | Discharge: 2020-12-12 | Disposition: A | Discharge status: Home / Self Care | Payer: Medicare Other | Attending: Cardiovascular Disease | Admitting: Cardiovascular Disease

## 2020-12-11 ENCOUNTER — Emergency Department (HOSPITAL_COMMUNITY): Payer: Medicare Other

## 2020-12-11 DIAGNOSIS — R0789 Other chest pain: Secondary | ICD-10-CM | POA: Diagnosis present

## 2020-12-11 DIAGNOSIS — I25119 Atherosclerotic heart disease of native coronary artery with unspecified angina pectoris: Secondary | ICD-10-CM | POA: Diagnosis not present

## 2020-12-11 DIAGNOSIS — Z955 Presence of coronary angioplasty implant and graft: Secondary | ICD-10-CM | POA: Insufficient documentation

## 2020-12-11 DIAGNOSIS — Z7901 Long term (current) use of anticoagulants: Secondary | ICD-10-CM | POA: Diagnosis not present

## 2020-12-11 DIAGNOSIS — I4819 Other persistent atrial fibrillation: Secondary | ICD-10-CM | POA: Diagnosis not present

## 2020-12-11 DIAGNOSIS — I200 Unstable angina: Secondary | ICD-10-CM

## 2020-12-11 DIAGNOSIS — Z8546 Personal history of malignant neoplasm of prostate: Secondary | ICD-10-CM | POA: Diagnosis not present

## 2020-12-11 DIAGNOSIS — J45909 Unspecified asthma, uncomplicated: Secondary | ICD-10-CM | POA: Diagnosis not present

## 2020-12-11 DIAGNOSIS — R079 Chest pain, unspecified: Secondary | ICD-10-CM | POA: Diagnosis present

## 2020-12-11 DIAGNOSIS — Z79899 Other long term (current) drug therapy: Secondary | ICD-10-CM | POA: Insufficient documentation

## 2020-12-11 DIAGNOSIS — Z7982 Long term (current) use of aspirin: Secondary | ICD-10-CM | POA: Diagnosis not present

## 2020-12-11 DIAGNOSIS — Z20822 Contact with and (suspected) exposure to covid-19: Secondary | ICD-10-CM | POA: Diagnosis not present

## 2020-12-11 LAB — I-STAT CHEM 8, ED
BUN: 14 mg/dL (ref 8–23)
Calcium, Ion: 1.13 mmol/L — ABNORMAL LOW (ref 1.15–1.40)
Chloride: 108 mmol/L (ref 98–111)
Creatinine, Ser: 1 mg/dL (ref 0.61–1.24)
Glucose, Bld: 96 mg/dL (ref 70–99)
HCT: 44 % (ref 39.0–52.0)
Hemoglobin: 15 g/dL (ref 13.0–17.0)
Potassium: 4.2 mmol/L (ref 3.5–5.1)
Sodium: 140 mmol/L (ref 135–145)
TCO2: 23 mmol/L (ref 22–32)

## 2020-12-11 LAB — COMPREHENSIVE METABOLIC PANEL
ALT: 31 U/L (ref 0–44)
AST: 26 U/L (ref 15–41)
Albumin: 3.9 g/dL (ref 3.5–5.0)
Alkaline Phosphatase: 55 U/L (ref 38–126)
Anion gap: 10 (ref 5–15)
BUN: 13 mg/dL (ref 8–23)
CO2: 19 mmol/L — ABNORMAL LOW (ref 22–32)
Calcium: 9.3 mg/dL (ref 8.9–10.3)
Chloride: 107 mmol/L (ref 98–111)
Creatinine, Ser: 1.13 mg/dL (ref 0.61–1.24)
GFR, Estimated: >60 mL/min (ref >60)
Glucose, Bld: 96 mg/dL (ref 70–99)
Potassium: 4.2 mmol/L (ref 3.5–5.1)
Sodium: 136 mmol/L (ref 135–145)
Total Bilirubin: 0.8 mg/dL (ref 0.3–1.2)
Total Protein: 7 g/dL (ref 6.5–8.1)

## 2020-12-11 LAB — CBC WITH DIFFERENTIAL/PLATELET
Abs Immature Granulocytes: 0.03 10*3/uL (ref 0.00–0.07)
Basophils Absolute: 0.1 10*3/uL (ref 0.0–0.1)
Basophils Relative: 1 %
Eosinophils Absolute: 0.2 10*3/uL (ref 0.0–0.5)
Eosinophils Relative: 3 %
HCT: 46.2 % (ref 39.0–52.0)
Hemoglobin: 14.8 g/dL (ref 13.0–17.0)
Immature Granulocytes: 1 %
Lymphocytes Relative: 29 %
Lymphs Abs: 1.9 10*3/uL (ref 0.7–4.0)
MCH: 28.3 pg (ref 26.0–34.0)
MCHC: 32 g/dL (ref 30.0–36.0)
MCV: 88.3 fL (ref 80.0–100.0)
Monocytes Absolute: 0.8 10*3/uL (ref 0.1–1.0)
Monocytes Relative: 12 %
Neutro Abs: 3.6 10*3/uL (ref 1.7–7.7)
Neutrophils Relative %: 54 %
Platelets: 247 10*3/uL (ref 150–400)
RBC: 5.23 MIL/uL (ref 4.22–5.81)
RDW: 13.2 % (ref 11.5–15.5)
WBC: 6.5 10*3/uL (ref 4.0–10.5)
nRBC: 0 % (ref 0.0–0.2)

## 2020-12-11 LAB — HIV ANTIBODY (ROUTINE TESTING W REFLEX): HIV Screen 4th Generation wRfx: NONREACTIVE

## 2020-12-11 LAB — TROPONIN I (HIGH SENSITIVITY)
Troponin I (High Sensitivity): 7 ng/L (ref <18)
Troponin I (High Sensitivity): 7 ng/L (ref <18)

## 2020-12-11 LAB — MAGNESIUM: Magnesium: 2.1 mg/dL (ref 1.7–2.4)

## 2020-12-11 MED ORDER — DOFETILIDE 500 MCG PO CAPS
500.0000 ug | ORAL_CAPSULE | Freq: Two times a day (BID) | ORAL | Status: DC
  Administered 2020-12-11 – 2020-12-12 (×2): 500 ug via ORAL
  Filled 2020-12-11 (×4): qty 1

## 2020-12-11 MED ORDER — PRAVASTATIN SODIUM 40 MG PO TABS
80.0000 mg | ORAL_TABLET | Freq: Every evening | ORAL | Status: DC
  Administered 2020-12-11: 80 mg via ORAL
  Filled 2020-12-11: qty 2

## 2020-12-11 MED ORDER — SODIUM CHLORIDE 0.9% FLUSH
3.0000 mL | Freq: Two times a day (BID) | INTRAVENOUS | Status: DC
  Administered 2020-12-11: 3 mL via INTRAVENOUS

## 2020-12-11 MED ORDER — PANTOPRAZOLE SODIUM 40 MG PO TBEC
40.0000 mg | DELAYED_RELEASE_TABLET | Freq: Two times a day (BID) | ORAL | Status: DC
  Administered 2020-12-11: 40 mg via ORAL
  Filled 2020-12-11: qty 1

## 2020-12-11 MED ORDER — AMLODIPINE BESYLATE 2.5 MG PO TABS
2.5000 mg | ORAL_TABLET | Freq: Every day | ORAL | Status: DC
  Administered 2020-12-11: 2.5 mg via ORAL
  Filled 2020-12-11: qty 1

## 2020-12-11 MED ORDER — FENTANYL CITRATE PF 50 MCG/ML IJ SOSY
50.0000 ug | PREFILLED_SYRINGE | Freq: Once | INTRAMUSCULAR | Status: AC
  Administered 2020-12-11: 50 ug via INTRAVENOUS
  Filled 2020-12-11: qty 1

## 2020-12-11 MED ORDER — ASPIRIN 81 MG PO CHEW
324.0000 mg | CHEWABLE_TABLET | ORAL | Status: AC
  Administered 2020-12-11: 324 mg via ORAL
  Filled 2020-12-11: qty 4

## 2020-12-11 MED ORDER — ASPIRIN EC 81 MG PO TBEC
81.0000 mg | DELAYED_RELEASE_TABLET | Freq: Every day | ORAL | Status: DC
  Administered 2020-12-12: 81 mg via ORAL
  Filled 2020-12-11: qty 1

## 2020-12-11 MED ORDER — ALBUTEROL SULFATE HFA 108 (90 BASE) MCG/ACT IN AERS
2.0000 | INHALATION_SPRAY | RESPIRATORY_TRACT | Status: DC | PRN
  Filled 2020-12-11: qty 6.7

## 2020-12-11 MED ORDER — ENOXAPARIN SODIUM 40 MG/0.4ML IJ SOSY
40.0000 mg | PREFILLED_SYRINGE | INTRAMUSCULAR | Status: DC
  Administered 2020-12-11: 40 mg via SUBCUTANEOUS
  Filled 2020-12-11: qty 0.4

## 2020-12-11 MED ORDER — SODIUM CHLORIDE 0.9 % WEIGHT BASED INFUSION
3.0000 mL/kg/h | INTRAVENOUS | Status: DC
  Administered 2020-12-12: 3 mL/kg/h via INTRAVENOUS

## 2020-12-11 MED ORDER — SODIUM CHLORIDE 0.9 % WEIGHT BASED INFUSION
1.0000 mL/kg/h | INTRAVENOUS | Status: DC
  Administered 2020-12-12: 1 mL/kg/h via INTRAVENOUS

## 2020-12-11 MED ORDER — SODIUM CHLORIDE 0.9 % IV SOLN: 250.0000 mL | INTRAVENOUS | Status: DC | PRN

## 2020-12-11 MED ORDER — BISOPROLOL FUMARATE 5 MG PO TABS
2.5000 mg | ORAL_TABLET | Freq: Every day | ORAL | Status: DC
  Administered 2020-12-11: 2.5 mg via ORAL
  Filled 2020-12-11: qty 0.5
  Filled 2020-12-11: qty 1
  Filled 2020-12-11: qty 0.5

## 2020-12-11 MED ORDER — NITROGLYCERIN 0.4 MG SL SUBL: 0.4000 mg | SUBLINGUAL_TABLET | SUBLINGUAL | Status: DC | PRN

## 2020-12-11 MED ORDER — ACETAMINOPHEN 325 MG PO TABS
650.0000 mg | ORAL_TABLET | ORAL | Status: DC | PRN
  Administered 2020-12-11: 650 mg via ORAL
  Filled 2020-12-11: qty 2

## 2020-12-11 MED ORDER — EZETIMIBE 10 MG PO TABS
10.0000 mg | ORAL_TABLET | Freq: Every evening | ORAL | Status: DC
  Administered 2020-12-11: 10 mg via ORAL
  Filled 2020-12-11 (×2): qty 1

## 2020-12-11 MED ORDER — ONDANSETRON HCL 4 MG/2ML IJ SOLN: 4.0000 mg | Freq: Four times a day (QID) | INTRAMUSCULAR | Status: DC | PRN

## 2020-12-11 MED ORDER — SODIUM CHLORIDE 0.9% FLUSH: 3.0000 mL | INTRAVENOUS | Status: DC | PRN

## 2020-12-11 MED ORDER — ASPIRIN 300 MG RE SUPP: 300.0000 mg | RECTAL | Status: AC

## 2020-12-11 MED ORDER — IOHEXOL 350 MG/ML SOLN
100.0000 mL | Freq: Once | INTRAVENOUS | Status: AC | PRN
  Administered 2020-12-11: 100 mL via INTRAVENOUS

## 2020-12-11 MED ORDER — MOMETASONE FURO-FORMOTEROL FUM 100-5 MCG/ACT IN AERO
2.0000 | INHALATION_SPRAY | Freq: Two times a day (BID) | RESPIRATORY_TRACT | Status: DC
  Administered 2020-12-11 – 2020-12-12 (×2): 2 via RESPIRATORY_TRACT
  Filled 2020-12-11: qty 8.8

## 2020-12-11 MED ORDER — DOFETILIDE 500 MCG PO CAPS: 500.0000 ug | ORAL_CAPSULE | Freq: Two times a day (BID) | ORAL | Status: DC

## 2020-12-11 NOTE — ED Provider Notes (Signed)
This patient presented for chest pain.  He has multiple risk factors for ACS.  Troponins have been negative.  Cardiology has seen him and plans to admit.  While in the ED, he developed some numbness and tingling in his face.  A CTA was ordered, the results of which were unremarkable.  Neurology was engaged at this time.  Patient may need MRI as an inpatient if he has any further symptoms. Physical Exam  BP 128/81   Pulse (!) 58   Temp 97.7 F (36.5 C) (Oral)   Resp 13   SpO2 99%   Physical Exam Constitutional:      General: He is not in acute distress.    Appearance: Normal appearance. He is normal weight. He is not ill-appearing, toxic-appearing or diaphoretic.  Neurological:     Mental Status: He is alert.    ED Course/Procedures     Procedures  MDM  On assessment, patient resting comfortably.  His wife is at bedside.  He reports only slight ongoing chest pain.  Analgesia was offered, which the patient declined.  Patient was amenable to admission.  Patient was admitted to cardiology service for further management.       Godfrey Pick, MD 12/12/20 [phone]

## 2020-12-11 NOTE — ED Triage Notes (Signed)
Pt took asa and nitro at home did not help the pain

## 2020-12-11 NOTE — ED Provider Notes (Signed)
Emergency Medicine Provider Triage Evaluation Note  Jose Warner , a 70 y.o. male  was evaluated in triage.  Pt complains of gradual onset, constant, waxing and waning, gradually worsening, substernal chest pain for the past week - worsened this AM. He was seen in the ED on 08/16 for same with negative troponins x 2. He was started on 30 mg Imdur and NTG and advised to follow up with cardiology. He has appt with cards on 09/08. The pain worsened today prompting return ED visit. He took 1 sublingual NTG today without relief. Rates pain 7/10. Hx stent in LAD in 2019.  Review of Systems  Positive: + chest pain, SOB Negative: - nausea, vomiting  Physical Exam  There were no vitals taken for this visit. Gen:   Awake, uncomfortable appearing. Clutching chest.  Resp:  Normal effort  MSK:   Moves extremities without difficulty  Other:    Medical Decision Making  Medically screening exam initiated at 11:08 AM.  Appropriate orders placed.  Masaki Rothbauer Schnitker was informed that the remainder of the evaluation will be completed by another provider, this initial triage assessment does not replace that evaluation, and the importance of remaining in the ED until their evaluation is complete.  Return ED visit for worsening chest pain. ED workup on 08/16 for same with negative troponin x 2. Pt uncomfortable appearing. ACS workup started at this time. [place] RN attempting to find room for patient at this time.    Eustaquio Maize, PA-C 12/11/20 1112    Malvin Johns, MD 12/11/20 1450

## 2020-12-11 NOTE — ED Triage Notes (Signed)
Pt here from home with c/o med chest pain , was at [facility] on the 16th for same and sent home

## 2020-12-11 NOTE — Telephone Encounter (Signed)
Pt is currently in the [facility] ED for the 2nd time in one week with Chest Pain.  Pt's daughter Lisabeth Pick  states that pt is currently sitting in the waiting room with CP and is unable to talk due to the pain. Pt's daughter Lisabeth Pick wants to know if there is anything that can be done to speed up the process so her father can be seen quicker. Pt went to Urgent Care last week and was advised by the Urgent Care physician to go straight to the ED if the CP start again.

## 2020-12-11 NOTE — H&P (Addendum)
Cardiology Admission History and Physical:    Patient ID: Jose Warner MRN: [id number]; DOB: 31-Jul-1950   Admission date: 12/11/2020  Primary Care Provider: Derinda Late, MD Primary Cardiologist: Shelva Majestic, MD  Primary Electrophysiologist:  None   Chief Complaint:  Chest Pain  Patient Profile:   Jose Warner is a 70 y.o. male with CAD status post PCI, atrial fibrillation on Xarelto, idiopathic pericarditis, asthma, candida esophagitis, celiac disease, GERD, prostate cancer in remission, esophageal spasm who presented to the ED with central chest pain and worsening shortness of breath for the past week.  History of Present Illness:   Jose Warner states that he awoke this morning and had centralized chest pain.  Describes the pain as something gripping around the heart.  He denies radiation of the pain.  Activity made the pain worse.  The pain resolved with rest.  The pain did not resolve with taking nitroglycerin earlier today. He did have some associated shortness of breath with the pain. Upon arrival to the emergency department the patient had left facial numbness and left jaw numbness.  He also had left arm tingling.  The chest pain he is having today is different from his chest pain when he had a stent placed.  The pain at that time was a heavy pressure.  Denies this pain feeling like his acid reflux pain. Endorses worsening shortness of breath over the past week with activity such as walking up the steps or working in the yard.  He denies associated lightheadedness or dizziness.  Does endorse some foggy headedness and tinnitus.  Denies difficulty with vision or speech.  Denies cough, back pain, chest pain at rest, nausea, vomiting.  He does endorse a celiac attack over the past 48 hours with associated diarrhea.  Denies any diarrhea today.  Denies any abdominal or leg swelling.  Denies any fevers or chills or night sweats.  He is able to tell when he is in atrial fibrillation  and he notes that he has not been this abnormal rhythm for the past few years.  He was seen in the ED on 08/16 for midsternal chest pain that was not radiating.  This pain occurred when he was lifting a shovel.  The pain at that time a sharp and then transitioned to a heaviness.  During his work-up at this time patient was evaluated by cardiology fellow who did not feel as though he needed to be admitted.  He had flat troponins and EKG without any ischemic changes.  He was instructed to follow-up with his cardiologist in the next 1 to 2 weeks and scribed Imdur 30 mg daily.  Past Medical History:  Diagnosis Date   Anemia    Bronchitis 05-10-12   past fall- 4 runs antibiotics due to bronchitis-uses Pro Air as needed   Celiac disease    Colon polyps    adenomatous   Coronary artery disease    Diverticulosis    GERD (gastroesophageal reflux disease)    Hepatitis 05-10-12   "was told non A, non B"-unclean dental equip.   Hyperlipidemia    Iron deficiency anemia    Pericarditis    Persistent atrial fibrillation ([place]) 06/05/2017   Prostate cancer ([place]) 05-10-12   ;bx. 04-12-12, dx   Spasm of esophagus 2013    Past Surgical History:  Procedure Laterality Date   CARDIAC CATHETERIZATION  09/13/2004   LAD:30%-40% in prox. to mid segment with 20% in the mid segment and 20% in left Circ., mild 20%  right common iliac narrowing. medical therapy   CARDIOVERSION  06/05/2017   CARDIOVERSION N/A 11/11/2018   Procedure: CARDIOVERSION;  Surgeon: Pixie Casino, MD;  Location: [facility];  Service: Cardiovascular;  Laterality: N/A;   CORONARY STENT INTERVENTION N/A 06/11/2017   Procedure: CORONARY STENT INTERVENTION;  Surgeon: Belva Crome, MD;  Location: [facility];  Service: Cardiovascular;  Laterality: N/A;   HERNIA REPAIR  5 yrs ago   right    LEFT HEART CATH AND CORONARY ANGIOGRAPHY N/A 06/11/2017   Procedure: LEFT HEART CATH AND CORONARY ANGIOGRAPHY;  Surgeon: Belva Crome, MD;   Location: [facility];  Service: Cardiovascular;  Laterality: N/A;   LEFT HEART CATH AND CORONARY ANGIOGRAPHY N/A 08/18/2017   Procedure: LEFT HEART CATH AND CORONARY ANGIOGRAPHY;  Surgeon: Martinique, Peter M, MD;  Location: [facility];  Service: Cardiovascular;  Laterality: N/A;   NM MYOCAR PERF WALL MOTION  11/30/2007   protocol:Bruce, post EF 67%,mild perfusion defect seen in basal inferior consistant with Attenuation artifact, exercise cap. 12METS   ROBOT ASSISTED LAPAROSCOPIC RADICAL PROSTATECTOMY  05/20/2012   Procedure: ROBOTIC ASSISTED LAPAROSCOPIC RADICAL PROSTATECTOMY LEVEL 1;  Surgeon: Dutch Gray, MD;  Location: [facility];  Service: Urology;  Laterality: N/A;      TRANSURETHRAL RESECTION OF PROSTATE  61yr ago     Medications Prior to Admission: Prior to Admission medications   Medication Sig Start Date End Date Taking? Authorizing Provider  amLODipine (NORVASC) 2.5 MG tablet Take 1 tablet (2.5 mg total) by mouth daily. 08/31/17 12/11/20 Yes KTroy Sine MD  aspirin EC 81 MG tablet Take 1 tablet (81 mg total) by mouth daily. 01/19/18  Yes KTroy Sine MD  b complex vitamins tablet Take 1 tablet by mouth daily with supper.   Yes [provider]  bisoprolol (ZEBETA) 5 MG tablet Take 2.5 mg by mouth daily.    Yes [provider]  budesonide-formoterol (SYMBICORT) 80-4.5 MCG/ACT inhaler SMARTSIG:By Mouth 03/06/20  Yes [provider]  dofetilide (TIKOSYN) 500 MCG capsule Take 1 capsule (500 mcg total) by mouth 2 (two) times daily. 07/16/20  Yes Fenton, Clint R, PA  ezetimibe (ZETIA) 10 MG tablet Take 10 mg by mouth every evening.    Yes [provider]  fluticasone (FLONASE) 50 MCG/ACT nasal spray Place 1 spray into both nostrils as needed for allergies.   Yes [provider]  guaiFENesin (MUCINEX) 600 MG 12 hr tablet Take 600 mg by mouth daily.   Yes [provider]  Homeopathic Products ([facility] REMEDY PO) Take 1  tablet by mouth in the morning and at bedtime.   Yes [provider]  Multiple Vitamin (MULTIVITAMIN WITH MINERALS) TABS tablet Take 1 tablet by mouth daily with supper.   Yes [provider]  nitroGLYCERIN (NITROSTAT) 0.4 MG SL tablet Place 1 tablet (0.4 mg total) under the tongue every 5 (five) minutes x 3 doses as needed for chest pain. 03/28/20  Yes KTroy Sine MD  pantoprazole (PROTONIX) 40 MG tablet Take 40 mg by mouth 2 (two) times daily.  06/24/17  Yes [provider]  pravastatin (PRAVACHOL) 80 MG tablet Take 80 mg by mouth every evening.    Yes [provider]  PROAIR HFA 108 (90 BASE) MCG/ACT inhaler Take 2 puffs by mouth every 4 (four) hours as needed for wheezing or shortness of breath. For shortness of breath. 02/13/12  Yes [provider]  Vitamin D, Ergocalciferol, 2000 units  CAPS Take 2,000 Units by mouth at bedtime.    Yes [provider]  XARELTO 20 MG TABS tablet TAKE 1 TABLET DAILY WITH SUPPER 04/03/20  Yes Fenton, Clint R, PA  isosorbide mononitrate (IMDUR) 30 MG 24 hr tablet Take 1 tablet (30 mg total) by mouth daily. Patient not taking: No sig reported 12/04/20 01/03/21  Couture, Cortni S, PA-C     Allergies:    Allergies  Allergen Reactions   Doxycycline Hives   Gluten Meal Other (See Comments)    Celiac, stomach crams , gas   Wheat Bran Other (See Comments)    Celiac    Statins     Leg muscle pains - rosuvastatin, atorvastatin, zetia. Tolerates simvastatin   Sulfa Antibiotics Rash    Social History:   Social History   Socioeconomic History   Marital status: Married    Spouse name: Not on file   Number of children: 3   Years of education: Not on file   Highest education level: Not on file  Occupational History    Employer: [employer]  Tobacco Use   Smoking status: Never   Smokeless tobacco: Never  Vaping Use   Vaping Use: Never used  Substance and Sexual Activity   Alcohol use: Not Currently     Alcohol/week: 2.0 standard drinks    Types: 2 Glasses of wine per week   Drug use: No   Sexual activity: Yes    Partners: Female  Other Topics Concern   Not on file  Social History Narrative   Not on file   Social Determinants of Health   Financial Resource Strain: Not on file  Food Insecurity: Not on file  Transportation Needs: Not on file  Physical Activity: Not on file  Stress: Not on file  Social Connections: Not on file  Intimate Partner Violence: Not on file    Family History:   The patient's family history includes Cancer in his paternal grandmother and sister; Heart attack in his father and paternal grandfather; Heart disease in his father and paternal grandfather; Stroke in his maternal grandfather and mother. There is no history of Colon cancer, Esophageal cancer, Pancreatic cancer, Rectal cancer, or Stomach cancer.  Patient's father as well as grandfather's heart disease was when they were in their 69s.  His brother recently had a quadruple bypass surgery in his 36s.  Patient's mother passed away from a stroke.  ROS:  Please see the history of present illness.  All other ROS reviewed and negative.     Physical Exam/Data:   Vitals:   12/11/20 1530 12/11/20 1545 12/11/20 1615 12/11/20 1645  BP: 103/68 118/69 128/81 127/68  Pulse: (!) 52 (!) 58 (!) 58 (!) 50  Resp: (!) 21 19 13 18   Temp:      TempSrc:      SpO2: 95% 96% 99% 92%   No intake or output data in the 24 hours ending 12/11/20 1717 Last 3 Weights 12/04/2020 09/24/2020 03/28/2020  Weight (lbs) 180 lb 181 lb 9.6 oz 187 lb  Weight (kg) 81.647 kg 82.373 kg 84.823 kg     There is no height or weight on file to calculate BMI.   Constitutional: Well-appearing, no acute distress HENT: normocephalic atraumatic, mucous membranes moist. Eyes: conjunctiva non-erythematous.  PERRL Neck: supple Cardiovascular: regular rate and rhythm, no m/r/g or heaves.  No JVD.  No lower extremity swelling. Pulmonary/Chest: normal  work of breathing on room air, lungs clear to auscultation bilaterally Abdominal: soft, epigastric tenderness,  nondistended. MSK: normal bulk and tone Neurological: alert & oriented x 3, 5/5 strength in bilateral upper and lower extremities, normal sensation.  No facial asymmetry.  No uvular deviation. Skin: warm and dry Psych: Normal mood and thought process  EKG:  The ECG that was done in the emergency department was personally reviewed and demonstrates sinus bradycardia rate of 55 bpm. Normal axis. Normal intervals. Prolonged Qtc. No ST wave abnormalities. Compared to prior from 12/04/20 QT prolongation is new.   Relevant CV Studies:  [place] 2006 Normal ventricular function Mild, not significant obstructive coronary artery disease with luminal irregularity of the left anterior descending and narrowings of 30% to 40% in the proximal to mid segment with 20% in the midsegment; and 20% proximal left circumflex narrowing.  Also found to have mild 20% right common iliac narrowing.  Myocardial Perfusion Imaging 2009 Mild perfusion defect in the basal inferior and mid inferior and basal to inferolateral regions.  Consistent with attenuation artifact.  Abnormal study.  Post-rest EF of 67%.  Global left ventricular systolic function was normal.  There was no significant change from prior study and no significant ischemic demonstration.  Low risk scan.  [facility] 05/2017 Acute coronary syndrome with a pattern of unstable angina due to 95% mid LAD obstruction within the diffusely diseased/calcified segment. Normal left main Normal circumflex Normal right coronary Normal left ventricular function with EF estimated to be 60%.  LVEDP is normal. Successful angioplasty and stenting of the LAD 95% stenosis to 0% using a 16 mm x 2.75 mm Synergy postdilated to high pressure with a 2.75 balloon resulting in TIMI grade III flow and proximal and distal step up.  LHC 07/2017 Mid LAD lesion is 40% stenosed. Prox LAD-1  lesion is 30% stenosed. Prox LAD-2 lesion is 40% stenosed. Previously placed Prox LAD-3 stent (unknown type) is widely patent. The left ventricular systolic function is normal. LV end diastolic pressure is normal. The left ventricular ejection fraction is 55-65% by visual estimate.  TTE 2020 Left ventricular ejection fraction, by visual estimation, is 55 to  60%. The left ventricle has normal function. Normal left ventricular size.  There is mildly increased left ventricular hypertrophy.  Elevated left ventricular end-diastolic pressure.  Left ventricular diastolic Doppler parameters are consistent with  pseudonormalization pattern of LV diastolic filling.  Global right ventricle has normal systolic function.The right  ventricular size is normal. No increase in right ventricular wall  thickness.  Left atrial size was moderately dilated.  Right atrial size was normal.  The mitral valve is normal in structure. Mild mitral valve  regurgitation. No evidence of mitral stenosis.  The tricuspid valve is normal in structure. Tricuspid valve  regurgitation is mild.  The aortic valve is normal in structure. Aortic valve regurgitation is  mild to moderate by color flow Doppler. Structurally normal aortic valve,  with no evidence of sclerosis or stenosis.  The pulmonic valve was normal in structure. Pulmonic valve  regurgitation is not visualized by color flow Doppler.  The inferior vena cava is normal in size with greater than 50%  respiratory variability, suggesting right atrial pressure of 3 mmHg.   Laboratory Data:  Chemistry Recent Labs  Lab 12/04/20 1747 12/11/20 1200 12/11/20 1236  NA 136 136 140  K 3.9 4.2 4.2  CL 106 107 108  CO2 23 19*  --   GLUCOSE 95 96 96  BUN 15 13 14   CREATININE 1.19 1.13 1.00  CALCIUM 8.8* 9.3  --   GFRNONAA >60 >60  --  ANIONGAP 7 10  --     Recent Labs  Lab 12/11/20 1200  PROT 7.0  ALBUMIN 3.9  AST 26  ALT 31  ALKPHOS 55  BILITOT 0.8    Hematology Recent Labs  Lab 12/04/20 1747 12/11/20 1200 12/11/20 1236  WBC 6.5 6.5  --   RBC 4.86 5.23  --   HGB 14.1 14.8 15.0  HCT 42.0 46.2 44.0  MCV 86.4 88.3  --   MCH 29.0 28.3  --   MCHC 33.6 32.0  --   RDW 13.2 13.2  --   PLT 238 247  --    Cardiac EnzymesNo results for input(s): TROPONINI in the last 168 hours. No results for input(s): TROPIPOC in the last 168 hours.  BNPNo results for input(s): BNP, PROBNP in the last 168 hours.  DDimer No results for input(s): DDIMER in the last 168 hours.  Radiology/Studies:  CT Angio Head W/Cm &/Or Wo Cm  Result Date: 12/11/2020 CLINICAL DATA:  Neuro deficit EXAM: CT ANGIOGRAPHY HEAD AND NECK TECHNIQUE: Multidetector CT imaging of the head and neck was performed using the standard protocol during bolus administration of intravenous contrast. Multiplanar CT image reconstructions and MIPs were obtained to evaluate the vascular anatomy. Carotid stenosis measurements (when applicable) are obtained utilizing NASCET criteria, using the distal internal carotid diameter as the denominator. CONTRAST:  133m OMNIPAQUE IOHEXOL 350 MG/ML SOLN COMPARISON:  CT neck 12/29/2017 FINDINGS: CT HEAD FINDINGS Brain: There is no acute intracranial hemorrhage, extra-axial fluid collection, or infarct. The ventricles are not enlarged. There is no midline shift. No mass lesion is identified. Vascular: See below Skull: Normal. Negative for fracture or focal lesion. Sinuses and orbits: The imaged paranasal sinuses are clear. Bilateral lens implants are in place. The globes and orbits are otherwise unremarkable. Other: Mastoid air cells are clear. There is cerumen in the left external auditory canal. Review of the MIP images confirms the above findings CTA NECK FINDINGS Aortic arch: There is minimal calcified atherosclerotic plaque of the aortic arch. The aortic arch is otherwise unremarkable. Right carotid system: There is mild calcified atherosclerotic plaque at the  right carotid bulb without hemodynamically significant stenosis or occlusion. There is no evidence of dissection or aneurysm. Left carotid system: There is mild calcified atherosclerotic plaque of the left carotid bulb without hemodynamically significant stenosis or occlusion. There is no dissection or aneurysm. The left internal carotid artery takes a medialized/retropharyngeal course. Vertebral arteries: There is mild calcified atherosclerotic plaque in the proximal right vertebral artery resulting in up to mild stenosis. The right vertebral artery is dominant, a normal variant. The left vertebral artery is relatively diminutive but patent Skeleton: There is no acute osseous abnormality. There is mild degenerative change of the cervical spine. Other neck: The soft tissues are unremarkable. Upper chest: The lung apices are clear. Review of the MIP images confirms the above findings CTA HEAD FINDINGS Anterior circulation: There is mild calcified atherosclerotic plaque of the bilateral cavernous ICAs without hemodynamically significant stenosis or occlusion. The bilateral MCAs and ACAs are patent. There is no aneurysm. Posterior circulation: There is a PICA termination of the left vertebral artery, a normal variant. The right vertebral artery is patent. The basilar artery is patent. The bilateral PCAs are patent. Venous sinuses: As permitted by contrast timing, patent. Anatomic variants: As above. Review of the MIP images confirms the above findings IMPRESSION: 1. No acute intracranial pathology. 2. Mild calcified atherosclerotic plaque of the bilateral carotid bulbs and cavernous ICAs without hemodynamically  significant stenosis or occlusion. No aneurysm identified. Electronically Signed   By: Valetta Mole M.D.   On: 12/11/2020 14:27   DG Chest 2 View  Result Date: 12/11/2020 CLINICAL DATA:  Chest pain. EXAM: CHEST - 2 VIEW COMPARISON:  12/04/2020. FINDINGS: The heart size and mediastinal contours are within  normal limits. Low lung volumes. No consolidation. No visible pleural effusions or pneumothorax. No acute osseous abnormality. IMPRESSION: No active cardiopulmonary disease. Electronically Signed   By: Margaretha Sheffield M.D.   On: 12/11/2020 12:08   CT Angio Neck W and/or Wo Contrast  Result Date: 12/11/2020 CLINICAL DATA:  Neuro deficit EXAM: CT ANGIOGRAPHY HEAD AND NECK TECHNIQUE: Multidetector CT imaging of the head and neck was performed using the standard protocol during bolus administration of intravenous contrast. Multiplanar CT image reconstructions and MIPs were obtained to evaluate the vascular anatomy. Carotid stenosis measurements (when applicable) are obtained utilizing NASCET criteria, using the distal internal carotid diameter as the denominator. CONTRAST:  162m OMNIPAQUE IOHEXOL 350 MG/ML SOLN COMPARISON:  CT neck 12/29/2017 FINDINGS: CT HEAD FINDINGS Brain: There is no acute intracranial hemorrhage, extra-axial fluid collection, or infarct. The ventricles are not enlarged. There is no midline shift. No mass lesion is identified. Vascular: See below Skull: Normal. Negative for fracture or focal lesion. Sinuses and orbits: The imaged paranasal sinuses are clear. Bilateral lens implants are in place. The globes and orbits are otherwise unremarkable. Other: Mastoid air cells are clear. There is cerumen in the left external auditory canal. Review of the MIP images confirms the above findings CTA NECK FINDINGS Aortic arch: There is minimal calcified atherosclerotic plaque of the aortic arch. The aortic arch is otherwise unremarkable. Right carotid system: There is mild calcified atherosclerotic plaque at the right carotid bulb without hemodynamically significant stenosis or occlusion. There is no evidence of dissection or aneurysm. Left carotid system: There is mild calcified atherosclerotic plaque of the left carotid bulb without hemodynamically significant stenosis or occlusion. There is no  dissection or aneurysm. The left internal carotid artery takes a medialized/retropharyngeal course. Vertebral arteries: There is mild calcified atherosclerotic plaque in the proximal right vertebral artery resulting in up to mild stenosis. The right vertebral artery is dominant, a normal variant. The left vertebral artery is relatively diminutive but patent Skeleton: There is no acute osseous abnormality. There is mild degenerative change of the cervical spine. Other neck: The soft tissues are unremarkable. Upper chest: The lung apices are clear. Review of the MIP images confirms the above findings CTA HEAD FINDINGS Anterior circulation: There is mild calcified atherosclerotic plaque of the bilateral cavernous ICAs without hemodynamically significant stenosis or occlusion. The bilateral MCAs and ACAs are patent. There is no aneurysm. Posterior circulation: There is a PICA termination of the left vertebral artery, a normal variant. The right vertebral artery is patent. The basilar artery is patent. The bilateral PCAs are patent. Venous sinuses: As permitted by contrast timing, patent. Anatomic variants: As above. Review of the MIP images confirms the above findings IMPRESSION: 1. No acute intracranial pathology. 2. Mild calcified atherosclerotic plaque of the bilateral carotid bulbs and cavernous ICAs without hemodynamically significant stenosis or occlusion. No aneurysm identified. Electronically Signed   By: PValetta MoleM.D.   On: 12/11/2020 14:27   CT Angio Chest Aorta w/CM &/OR wo/CM  Result Date: 12/11/2020 CLINICAL DATA:  Chest pain, back pain suspected aortic dissection in a 70year old male. EXAM: CT ANGIOGRAPHY CHEST WITHOUT AND WITH CONTRAST TECHNIQUE: Multidetector CT imaging of the chest was  performed using the standard protocol prior to and during bolus administration of intravenous contrast. Multiplanar CT image reconstructions and MIPs were obtained to evaluate the vascular anatomy. CONTRAST:   146m OMNIPAQUE IOHEXOL 350 MG/ML SOLN COMPARISON:  CT angiography of the neck of December 12, 2019. CT of the chest dated April 29, 2018. FINDINGS: Cardiovascular:  Pre contrast imaging shows no intramural hematoma. Ascending aortic caliber 3.3 cm within 1 mm of previous imaging from 2020. No sign of vasculitis, dissection or aneurysm. Heart size top normal without substantial pericardial fluid. Signs of calcified coronary artery disease. Limited opacification of normal caliber pulmonary vasculature. Mediastinum/Nodes: Esophagus minimally patulous. No thoracic inlet, axillary or mediastinal adenopathy. No signs of hilar lymphadenopathy. Lungs/Pleura: Basilar atelectasis. No effusion. No consolidative changes. Airways are patent. Upper Abdomen: Stable cyst in the LEFT hepatic lobe. Signs of hepatic steatosis. No pericholecystic stranding. The imaged portions of pancreas, spleen and adrenal glands are unremarkable. Cyst in the LEFT kidney, upper pole similar to prior imaging. No upper abdominal lymphadenopathy. Visualized gastrointestinal structures are unremarkable. Musculoskeletal: No acute musculoskeletal finding. Spinal degenerative changes. Review of the MIP images confirms the above findings. IMPRESSION: 1. No evidence of thoracic aortic aneurysm or dissection. 2. Signs of calcified coronary artery disease. 3. Signs of hepatic steatosis. 4. Stable LEFT hepatic and LEFT renal cysts. 5. Aortic atherosclerosis. Aortic Atherosclerosis (ICD10-I70.0). Electronically Signed   By: GZetta BillsM.D.   On: 12/11/2020 14:38     Assessment and Plan:   Typical Chest Pain Hx of CAD s/p mid-LAD PCI Patient presenting with centralized chest pain that is worse with exertion and improved with rest.  He continues to have the pain at this time but it is quite dull.  Does endorse numbness of his left jaw and neck and tingling in his left arm.  Denies any improvement with nitroglycerin.  High-sensitivity troponins are  negative.  EKG without changes from prior and without ST wave change abnormalities.  Physical exam is unremarkable. Imaging without evidence of intracranial abnormality.  With patient's significant history of CAD including stent placement and last heart catheterization being in 2019 30% to 40% stenosis of his LAD, believe patient will need admission for heart catheterization to rule out ischemic etiology. It is concerning he has been to the ED multiple times in the past week for his chest pain. We will plan for cardiac catheterization tomorrow. Last Cath, radial access without complications. TIMI score of 4 points, 20% risk of 14 day all cause mortality.  -cardiac catheterization tomorrow -continue 81 mg aspirin, without EKG changes and negative troponins, will hold on further antiplatelet therapy at this time. -nitroglycerin as needed for continued chest pain -continue home bisoprolol -NPO at midnight  Atrial Fibrillation Dx in 2019. S/p DC cardioversion 2020 however had recurrent disease. Has not required additional cardioversion and has been stable on xarelto and tikosyn. NSR on exam. Susepct QTC prolongation is secondary to tikosyn, but this is new from prior EKG 8 days ago and from initial EKG on presentation. Plan to repeat EKG after evening dose of Tikosyn.  -continue Tikosyn 500 mg BID, repeat EKG this evening at midnight -hold xarelto this evening in anticipation for catheterization tomorrow -magnesium level pending, other electrolytes stable -continue cardiac monitoring  Hx of Celiac Disease Last celiac attack yesterday, no episodes of diarrhea today.  -continue to monitor -Gluten free diet during admission  Asthma Patient without wheezing on examination. Continue home inhalers during admission.   GERD Continue protonix 40 mg BID.  Code Status: Full Code Diet: Gluten Free, NPO at Midnight DVT Prophylaxis: Lovenox   Severity of Illness: The appropriate patient status for this  patient is INPATIENT. Inpatient status is judged to be reasonable and necessary in order to provide the required intensity of service to ensure the patient's safety. The patient's presenting symptoms, physical exam findings, and initial radiographic and laboratory data in the context of their chronic comorbidities is felt to place them at high risk for further clinical deterioration. Furthermore, it is not anticipated that the patient will be medically stable for discharge from the hospital within 2 midnights of admission. The following factors support the patient status of inpatient.   " The patient's presenting symptoms include chest pain, shortness of breath, neck/jaw numbness. " The worrisome physical exam findings include none. " The initial radiographic and laboratory data are worrisome because of n/a. " The chronic co-morbidities include significant history of CAD, HLD, pericarditis.   * I certify that at the point of admission it is my clinical judgment that the patient will require inpatient hospital care spanning beyond 2 midnights from the point of admission due to high intensity of service, high risk for further deterioration and high frequency of surveillance required.Sanjuana Letters DO  Internal Medicine Resident PGY-2 [place]  Pager: [phone]  Patient seen, examined. Available data reviewed. Agree with findings, assessment, and plan as outlined by Dr Johnney Ou.  The patient is independently interviewed and examined.  He is alert, oriented, in no distress.  HEENT is normal.  JVP is normal.  Carotid upstrokes are normal with no bruits.  Lung fields are clear bilaterally.  Heart is regular rate and rhythm no murmur gallop.  Abdomen soft and nontender with no organomegaly.  Extremities have no edema.  Skin is warm and dry with no rash.  Neurologic is grossly intact.  EKG shows normal sinus rhythm with prolonged QT (QTC 508 ms).  There are no ischemic changes.  Troponin  is negative x2.  The patient has now been evaluated twice over the past week for chest pain.  He has a pressure-like sensation in the center of his chest.  There are symptoms with exertion and at rest.  Considering his history of known coronary artery disease with previous LAD stenting, last heart catheterization 2019, and progressive symptoms of angina, I have recommended definitive evaluation with cardiac catheterization and possible PCI.  We have discussed alternatives such as noninvasive assessment since his cardiac biomarkers are negative.  Shared decision making discussion occurs.  We agree that definitive evaluation is appropriate. I have reviewed the risks, indications, and alternatives to cardiac catheterization, possible angioplasty, and stenting with the patient. Risks include but are not limited to bleeding, infection, vascular injury, stroke, myocardial infection, arrhythmia, kidney injury, radiation-related injury in the case of prolonged fluoroscopy use, emergency cardiac surgery, and death. The patient understands the risks of serious complication is 1-2 in 6195 with diagnostic cardiac cath and 1-2% or less with angioplasty/stenting.  Since his troponin is negative, I will not start him on unfractionated heparin tonight.  He has been on rivaroxaban, last dose yesterday.  Rivaroxaban will be held until after his heart catheterization.  The patient has mild QT prolongation and he is on dofetilide 500 mg twice daily.  We will repeat another EKG after his next dose of dofetilide.  For now continue current management.  He will be monitored on telemetry.  His potassium is within normal limits.  We will add a  magnesium.  Further plans/disposition pending his cardiac catheterization tomorrow.  Sherren Mocha, M.D. 12/11/2020 6:25 PM

## 2020-12-11 NOTE — ED Notes (Signed)
Patient transported to CT 

## 2020-12-11 NOTE — ED Notes (Signed)
Patient transported to X-ray 

## 2020-12-11 NOTE — ED Notes (Signed)
RN placed pts dinner tray order

## 2020-12-11 NOTE — Telephone Encounter (Signed)
Dtr informed that there is nothing we can do from office standpoint nor speed up the process. Informed that if dad is currently having CP that he should inform the nurse right away there in the emergency room. She will advise pt to do so She appreciates the return call

## 2020-12-11 NOTE — ED Provider Notes (Signed)
[facility] EMERGENCY DEPARTMENT Provider Note   CSN: [id number] Arrival date & time: 12/11/20  1044     History No chief complaint on file.   Jose Warner is a 70 y.o. male.  Patient is a 70 year old male with a history of coronary artery disease with prior stent placement, atrial fibrillation on Xarelto, celiac disease, pericarditis who presents with chest pain.  He was seen here on August 16 for chest pain.  Cardiology fellow was consulted and had recommended that time starting Imdur.  He has not yet started that.  He says that his pain was a pressure feeling and is been intermittent since that time but today got worse.  He describes a tightness to the center of his chest going to his left side.  He has some associated shortness of breath.  He says that it is worse with exertion and eases off at rest.  He took a nitroglycerin earlier this morning without improvement in symptoms.  He says that while he was in x-ray getting his chest x-ray just prior to my evaluation, he started having some numbness in the left side of the face around his left ear and some tingling in his left hand.  He feels like his left hand may be a little bit weaker than the right although he says he is left-handed so this may be at baseline.  He denies any numbness or weakness to his lower extremities.  No new vision changes.  No speech deficits.      Past Medical History:  Diagnosis Date   Anemia    Bronchitis 05-10-12   past fall- 4 runs antibiotics due to bronchitis-uses Pro Air as needed   Celiac disease    Colon polyps    adenomatous   Coronary artery disease    Diverticulosis    GERD (gastroesophageal reflux disease)    Hepatitis 05-10-12   "was told non A, non B"-unclean dental equip.   Hyperlipidemia    Iron deficiency anemia    Pericarditis    Persistent atrial fibrillation (HCC) 06/05/2017   Prostate cancer ([place]) 05-10-12   ;bx. 04-12-12, dx   Spasm of esophagus 2013     Patient Active Problem List   Diagnosis Date Noted   Secondary hypercoagulable state ([place]) 05/19/2019   Persistent atrial fibrillation ([place]) 01/04/2019   Chronic anticoagulation 08/17/2017   Asthma 08/17/2017   Unstable angina ([place]) 08/17/2017   ACS (acute coronary syndrome) ([place]) 06/14/2017   GERD (gastroesophageal reflux disease) 06/10/2017   Chest pain 06/09/2017   Erectile dysfunction following radical prostatectomy 06/08/2013   FHx: coronary artery disease 11/26/2012   H/O prostate cancer 11/26/2012   Low HDL (under 40) 11/26/2012   Paroxysmal atrial fibrillation ([place]) 11/26/2012   CAD S/P percutaneous coronary angioplasty 11/26/2012    Past Surgical History:  Procedure Laterality Date   CARDIAC CATHETERIZATION  09/13/2004   LAD:30%-40% in prox. to mid segment with 20% in the mid segment and 20% in left Circ., mild 20% right common iliac narrowing. medical therapy   CARDIOVERSION  06/05/2017   CARDIOVERSION N/A 11/11/2018   Procedure: CARDIOVERSION;  Surgeon: Pixie Casino, MD;  Location: [facility];  Service: Cardiovascular;  Laterality: N/A;   CORONARY STENT INTERVENTION N/A 06/11/2017   Procedure: CORONARY STENT INTERVENTION;  Surgeon: Belva Crome, MD;  Location: [facility];  Service: Cardiovascular;  Laterality: N/A;   HERNIA REPAIR  5 yrs ago   right    LEFT HEART CATH AND  CORONARY ANGIOGRAPHY N/A 06/11/2017   Procedure: LEFT HEART CATH AND CORONARY ANGIOGRAPHY;  Surgeon: Belva Crome, MD;  Location: [facility];  Service: Cardiovascular;  Laterality: N/A;   LEFT HEART CATH AND CORONARY ANGIOGRAPHY N/A 08/18/2017   Procedure: LEFT HEART CATH AND CORONARY ANGIOGRAPHY;  Surgeon: Martinique, Peter M, MD;  Location: [facility];  Service: Cardiovascular;  Laterality: N/A;   NM MYOCAR PERF WALL MOTION  11/30/2007   protocol:Bruce, post EF 67%,mild perfusion defect seen in basal inferior consistant with Attenuation artifact, exercise cap. 12METS    ROBOT ASSISTED LAPAROSCOPIC RADICAL PROSTATECTOMY  05/20/2012   Procedure: ROBOTIC ASSISTED LAPAROSCOPIC RADICAL PROSTATECTOMY LEVEL 1;  Surgeon: Dutch Gray, MD;  Location: [facility];  Service: Urology;  Laterality: N/A;      TRANSURETHRAL RESECTION OF PROSTATE  76yr ago       Family History  Problem Relation Age of Onset   Stroke Mother    Heart disease Father    Heart attack Father    Cancer Sister    Stroke Maternal Grandfather    Cancer Paternal Grandmother    Heart attack Paternal Grandfather    Heart disease Paternal Grandfather    Colon cancer Neg Hx    Esophageal cancer Neg Hx    Pancreatic cancer Neg Hx    Rectal cancer Neg Hx    Stomach cancer Neg Hx     Social History   Tobacco Use   Smoking status: Never   Smokeless tobacco: Never  Vaping Use   Vaping Use: Never used  Substance Use Topics   Alcohol use: Not Currently    Alcohol/week: 2.0 standard drinks    Types: 2 Glasses of wine per week   Drug use: No    Home Medications Prior to Admission medications   Medication Sig Start Date End Date Taking? Authorizing Provider  amLODipine (NORVASC) 2.5 MG tablet Take 1 tablet (2.5 mg total) by mouth daily. 08/31/17 12/11/20 Yes KTroy Sine MD  aspirin EC 81 MG tablet Take 1 tablet (81 mg total) by mouth daily. 01/19/18  Yes KTroy Sine MD  b complex vitamins tablet Take 1 tablet by mouth daily with supper.   Yes [provider]  bisoprolol (ZEBETA) 5 MG tablet Take 2.5 mg by mouth daily.    Yes [provider]  budesonide-formoterol (SYMBICORT) 80-4.5 MCG/ACT inhaler SMARTSIG:By Mouth 03/06/20  Yes [provider]  dofetilide (TIKOSYN) 500 MCG capsule Take 1 capsule (500 mcg total) by mouth 2 (two) times daily. 07/16/20  Yes Fenton, Clint R, PA  ezetimibe (ZETIA) 10 MG tablet Take 10 mg by mouth every evening.    Yes [provider]  fluticasone (FLONASE) 50 MCG/ACT nasal spray Place 1 spray into both nostrils as needed for  allergies.   Yes [provider]  guaiFENesin (MUCINEX) 600 MG 12 hr tablet Take 600 mg by mouth daily.   Yes [provider]  Homeopathic Products ([facility]OLD REMEDY PO) Take 1 tablet by mouth in the morning and at bedtime.   Yes [provider]  Multiple Vitamin (MULTIVITAMIN WITH MINERALS) TABS tablet Take 1 tablet by mouth daily with supper.   Yes [provider]  nitroGLYCERIN (NITROSTAT) 0.4 MG SL tablet Place 1 tablet (0.4 mg total) under the tongue every 5 (five) minutes x 3 doses as needed for chest pain. 03/28/20  Yes KTroy Sine MD  pantoprazole (PROTONIX) 40 MG tablet Take 40 mg by mouth 2 (two) times  daily.  06/24/17  Yes [provider]  pravastatin (PRAVACHOL) 80 MG tablet Take 80 mg by mouth every evening.    Yes [provider]  PROAIR HFA 108 (90 BASE) MCG/ACT inhaler Take 2 puffs by mouth every 4 (four) hours as needed for wheezing or shortness of breath. For shortness of breath. 02/13/12  Yes [provider]  Vitamin D, Ergocalciferol, 2000 units CAPS Take 2,000 Units by mouth at bedtime.    Yes [provider]  XARELTO 20 MG TABS tablet TAKE 1 TABLET DAILY WITH SUPPER 04/03/20  Yes Fenton, Clint R, PA  isosorbide mononitrate (IMDUR) 30 MG 24 hr tablet Take 1 tablet (30 mg total) by mouth daily. Patient not taking: No sig reported 12/04/20 01/03/21  Couture, Cortni S, PA-C    Allergies    Doxycycline, Gluten meal, Wheat bran, Statins, and Sulfa antibiotics  Review of Systems   Review of Systems  Constitutional:  Negative for chills, diaphoresis, fatigue and fever.  HENT:  Negative for congestion, rhinorrhea and sneezing.   Eyes: Negative.   Respiratory:  Positive for shortness of breath. Negative for cough and chest tightness.   Cardiovascular:  Positive for chest pain. Negative for leg swelling.  Gastrointestinal:  Negative for abdominal pain, blood in stool, diarrhea, nausea and vomiting.   Genitourinary:  Negative for difficulty urinating, flank pain, frequency and hematuria.  Musculoskeletal:  Negative for arthralgias and back pain.  Skin:  Negative for rash.  Neurological:  Positive for numbness and headaches. Negative for dizziness, speech difficulty and weakness.   Physical Exam Updated Vital Signs BP 127/68   Pulse (!) 50   Temp 97.7 F (36.5 C) (Oral)   Resp 18   SpO2 92%   Physical Exam Constitutional:      Appearance: He is well-developed.  HENT:     Head: Normocephalic and atraumatic.  Eyes:     Pupils: Pupils are equal, round, and reactive to light.  Cardiovascular:     Rate and Rhythm: Normal rate and regular rhythm.     Heart sounds: Normal heart sounds.  Pulmonary:     Effort: Pulmonary effort is normal. No respiratory distress.     Breath sounds: Normal breath sounds. No wheezing or rales.  Chest:     Chest wall: No tenderness.  Abdominal:     General: Bowel sounds are normal.     Palpations: Abdomen is soft.     Tenderness: There is no abdominal tenderness. There is no guarding or rebound.  Musculoskeletal:        General: Normal range of motion.     Cervical back: Normal range of motion and neck supple.  Lymphadenopathy:     Cervical: No cervical adenopathy.  Skin:    General: Skin is warm and dry.     Findings: No rash.  Neurological:     Mental Status: He is alert and oriented to person, place, and time.     Comments: Some slight numbness to the left side of his face as compared to the right.  He has some minor sensory deficit on his left hand as compared to his right.  I do not appreciate any strength deficit.  Motor and sensation are intact to the lower extremities.  No obvious facial drooping.  Visual fields full to confrontation.  No pronator drift    ED Results / Procedures / Treatments   Labs (all labs ordered are listed, but only abnormal results are displayed) Labs Reviewed  COMPREHENSIVE METABOLIC  PANEL - Abnormal; Notable  for the following components:      Result Value   CO2 19 (*)    All other components within normal limits  I-STAT CHEM 8, ED - Abnormal; Notable for the following components:   Calcium, Ion 1.13 (*)    All other components within normal limits  CBC WITH DIFFERENTIAL/PLATELET  TROPONIN I (HIGH SENSITIVITY)  TROPONIN I (HIGH SENSITIVITY)    EKG EKG Interpretation  Date/Time:  Tuesday December 11 2020 12:22:16 EDT Ventricular Rate:  52 PR Interval:  148 QRS Duration: 101 QT Interval:  538 QTC Calculation: 501 R Axis:   20 Text Interpretation: Sinus rhythm Abnormal R-wave progression, early transition Prolonged QT interval Confirmed by Malvin Johns [phone]) on 12/11/2020 1:58:24 PM  Radiology CT Angio Head W/Cm &/Or Wo Cm  Result Date: 12/11/2020 CLINICAL DATA:  Neuro deficit EXAM: CT ANGIOGRAPHY HEAD AND NECK TECHNIQUE: Multidetector CT imaging of the head and neck was performed using the standard protocol during bolus administration of intravenous contrast. Multiplanar CT image reconstructions and MIPs were obtained to evaluate the vascular anatomy. Carotid stenosis measurements (when applicable) are obtained utilizing NASCET criteria, using the distal internal carotid diameter as the denominator. CONTRAST:  157m OMNIPAQUE IOHEXOL 350 MG/ML SOLN COMPARISON:  CT neck 12/29/2017 FINDINGS: CT HEAD FINDINGS Brain: There is no acute intracranial hemorrhage, extra-axial fluid collection, or infarct. The ventricles are not enlarged. There is no midline shift. No mass lesion is identified. Vascular: See below Skull: Normal. Negative for fracture or focal lesion. Sinuses and orbits: The imaged paranasal sinuses are clear. Bilateral lens implants are in place. The globes and orbits are otherwise unremarkable. Other: Mastoid air cells are clear. There is cerumen in the left external auditory canal. Review of the MIP images confirms the above findings CTA NECK FINDINGS Aortic arch: There is minimal  calcified atherosclerotic plaque of the aortic arch. The aortic arch is otherwise unremarkable. Right carotid system: There is mild calcified atherosclerotic plaque at the right carotid bulb without hemodynamically significant stenosis or occlusion. There is no evidence of dissection or aneurysm. Left carotid system: There is mild calcified atherosclerotic plaque of the left carotid bulb without hemodynamically significant stenosis or occlusion. There is no dissection or aneurysm. The left internal carotid artery takes a medialized/retropharyngeal course. Vertebral arteries: There is mild calcified atherosclerotic plaque in the proximal right vertebral artery resulting in up to mild stenosis. The right vertebral artery is dominant, a normal variant. The left vertebral artery is relatively diminutive but patent Skeleton: There is no acute osseous abnormality. There is mild degenerative change of the cervical spine. Other neck: The soft tissues are unremarkable. Upper chest: The lung apices are clear. Review of the MIP images confirms the above findings CTA HEAD FINDINGS Anterior circulation: There is mild calcified atherosclerotic plaque of the bilateral cavernous ICAs without hemodynamically significant stenosis or occlusion. The bilateral MCAs and ACAs are patent. There is no aneurysm. Posterior circulation: There is a PICA termination of the left vertebral artery, a normal variant. The right vertebral artery is patent. The basilar artery is patent. The bilateral PCAs are patent. Venous sinuses: As permitted by contrast timing, patent. Anatomic variants: As above. Review of the MIP images confirms the above findings IMPRESSION: 1. No acute intracranial pathology. 2. Mild calcified atherosclerotic plaque of the bilateral carotid bulbs and cavernous ICAs without hemodynamically significant stenosis or occlusion. No aneurysm identified. Electronically Signed   By: PValetta MoleM.D.   On: 12/11/2020 14:27   DG  Chest  2 View  Result Date: 12/11/2020 CLINICAL DATA:  Chest pain. EXAM: CHEST - 2 VIEW COMPARISON:  12/04/2020. FINDINGS: The heart size and mediastinal contours are within normal limits. Low lung volumes. No consolidation. No visible pleural effusions or pneumothorax. No acute osseous abnormality. IMPRESSION: No active cardiopulmonary disease. Electronically Signed   By: Margaretha Sheffield M.D.   On: 12/11/2020 12:08   CT Angio Neck W and/or Wo Contrast  Result Date: 12/11/2020 CLINICAL DATA:  Neuro deficit EXAM: CT ANGIOGRAPHY HEAD AND NECK TECHNIQUE: Multidetector CT imaging of the head and neck was performed using the standard protocol during bolus administration of intravenous contrast. Multiplanar CT image reconstructions and MIPs were obtained to evaluate the vascular anatomy. Carotid stenosis measurements (when applicable) are obtained utilizing NASCET criteria, using the distal internal carotid diameter as the denominator. CONTRAST:  183m OMNIPAQUE IOHEXOL 350 MG/ML SOLN COMPARISON:  CT neck 12/29/2017 FINDINGS: CT HEAD FINDINGS Brain: There is no acute intracranial hemorrhage, extra-axial fluid collection, or infarct. The ventricles are not enlarged. There is no midline shift. No mass lesion is identified. Vascular: See below Skull: Normal. Negative for fracture or focal lesion. Sinuses and orbits: The imaged paranasal sinuses are clear. Bilateral lens implants are in place. The globes and orbits are otherwise unremarkable. Other: Mastoid air cells are clear. There is cerumen in the left external auditory canal. Review of the MIP images confirms the above findings CTA NECK FINDINGS Aortic arch: There is minimal calcified atherosclerotic plaque of the aortic arch. The aortic arch is otherwise unremarkable. Right carotid system: There is mild calcified atherosclerotic plaque at the right carotid bulb without hemodynamically significant stenosis or occlusion. There is no evidence of dissection or aneurysm.  Left carotid system: There is mild calcified atherosclerotic plaque of the left carotid bulb without hemodynamically significant stenosis or occlusion. There is no dissection or aneurysm. The left internal carotid artery takes a medialized/retropharyngeal course. Vertebral arteries: There is mild calcified atherosclerotic plaque in the proximal right vertebral artery resulting in up to mild stenosis. The right vertebral artery is dominant, a normal variant. The left vertebral artery is relatively diminutive but patent Skeleton: There is no acute osseous abnormality. There is mild degenerative change of the cervical spine. Other neck: The soft tissues are unremarkable. Upper chest: The lung apices are clear. Review of the MIP images confirms the above findings CTA HEAD FINDINGS Anterior circulation: There is mild calcified atherosclerotic plaque of the bilateral cavernous ICAs without hemodynamically significant stenosis or occlusion. The bilateral MCAs and ACAs are patent. There is no aneurysm. Posterior circulation: There is a PICA termination of the left vertebral artery, a normal variant. The right vertebral artery is patent. The basilar artery is patent. The bilateral PCAs are patent. Venous sinuses: As permitted by contrast timing, patent. Anatomic variants: As above. Review of the MIP images confirms the above findings IMPRESSION: 1. No acute intracranial pathology. 2. Mild calcified atherosclerotic plaque of the bilateral carotid bulbs and cavernous ICAs without hemodynamically significant stenosis or occlusion. No aneurysm identified. Electronically Signed   By: PValetta MoleM.D.   On: 12/11/2020 14:27   CT Angio Chest Aorta w/CM &/OR wo/CM  Result Date: 12/11/2020 CLINICAL DATA:  Chest pain, back pain suspected aortic dissection in a 70year old male. EXAM: CT ANGIOGRAPHY CHEST WITHOUT AND WITH CONTRAST TECHNIQUE: Multidetector CT imaging of the chest was performed using the standard protocol prior to  and during bolus administration of intravenous contrast. Multiplanar CT image reconstructions and MIPs were obtained to  evaluate the vascular anatomy. CONTRAST:  175m OMNIPAQUE IOHEXOL 350 MG/ML SOLN COMPARISON:  CT angiography of the neck of December 12, 2019. CT of the chest dated April 29, 2018. FINDINGS: Cardiovascular:  Pre contrast imaging shows no intramural hematoma. Ascending aortic caliber 3.3 cm within 1 mm of previous imaging from 2020. No sign of vasculitis, dissection or aneurysm. Heart size top normal without substantial pericardial fluid. Signs of calcified coronary artery disease. Limited opacification of normal caliber pulmonary vasculature. Mediastinum/Nodes: Esophagus minimally patulous. No thoracic inlet, axillary or mediastinal adenopathy. No signs of hilar lymphadenopathy. Lungs/Pleura: Basilar atelectasis. No effusion. No consolidative changes. Airways are patent. Upper Abdomen: Stable cyst in the LEFT hepatic lobe. Signs of hepatic steatosis. No pericholecystic stranding. The imaged portions of pancreas, spleen and adrenal glands are unremarkable. Cyst in the LEFT kidney, upper pole similar to prior imaging. No upper abdominal lymphadenopathy. Visualized gastrointestinal structures are unremarkable. Musculoskeletal: No acute musculoskeletal finding. Spinal degenerative changes. Review of the MIP images confirms the above findings. IMPRESSION: 1. No evidence of thoracic aortic aneurysm or dissection. 2. Signs of calcified coronary artery disease. 3. Signs of hepatic steatosis. 4. Stable LEFT hepatic and LEFT renal cysts. 5. Aortic atherosclerosis. Aortic Atherosclerosis (ICD10-I70.0). Electronically Signed   By: GZetta BillsM.D.   On: 12/11/2020 14:38    Procedures Procedures   Medications Ordered in ED Medications  fentaNYL (SUBLIMAZE) injection 50 mcg (50 mcg Intravenous Given 12/11/20 1217)  iohexol (OMNIPAQUE) 350 MG/ML injection 100 mL (100 mLs Intravenous Contrast Given  12/11/20 1408)    ED Course  I have reviewed the triage vital signs and the nursing notes.  Pertinent labs & imaging results that were available during my care of the patient were reviewed by me and considered in my medical decision making (see chart for details).    MDM Rules/Calculators/A&P                           Patient is a 70year old male male who presents with chest pain.  His troponins are negative.  No ischemic changes on EKG.  He does have exertional symptoms.  This is a second visit.  CTA shows no evidence of dissection.  He did have some numbness to the left side of his face and his left hand.  CTA of his head and neck are nonrevealing.  He has very minimal symptoms.  I spoke with neurology who did not feel that code stroke should be activated given that his symptoms were minimal and he had no indication for tPA.  Will consult cardiology for admission.  He may need an MRI if his symptoms continue. Final Clinical Impression(s) / ED Diagnoses Final diagnoses:  None    Rx / DC Orders ED Discharge Orders     None        BMalvin Johns MD 12/11/20 1654

## 2020-12-12 ENCOUNTER — Encounter (HOSPITAL_COMMUNITY): Payer: Self-pay | Admitting: Cardiovascular Disease

## 2020-12-12 ENCOUNTER — Encounter (HOSPITAL_COMMUNITY)
Admission: EM | Disposition: A | Discharge status: Home / Self Care | Payer: Self-pay | Source: Home / Self Care | Attending: Emergency Medicine

## 2020-12-12 DIAGNOSIS — R0789 Other chest pain: Secondary | ICD-10-CM | POA: Diagnosis not present

## 2020-12-12 DIAGNOSIS — I2510 Atherosclerotic heart disease of native coronary artery without angina pectoris: Secondary | ICD-10-CM

## 2020-12-12 DIAGNOSIS — I25119 Atherosclerotic heart disease of native coronary artery with unspecified angina pectoris: Secondary | ICD-10-CM

## 2020-12-12 HISTORY — PX: LEFT HEART CATH AND CORONARY ANGIOGRAPHY: CATH118249

## 2020-12-12 LAB — LIPID PANEL
Cholesterol: 147 mg/dL (ref 0–200)
HDL: 40 mg/dL — ABNORMAL LOW
LDL Cholesterol: 77 mg/dL (ref 0–99)
Total CHOL/HDL Ratio: 3.7 ratio
Triglycerides: 150 mg/dL — ABNORMAL HIGH
VLDL: 30 mg/dL (ref 0–40)

## 2020-12-12 LAB — BASIC METABOLIC PANEL WITH GFR
Anion gap: 6 (ref 5–15)
BUN: 13 mg/dL (ref 8–23)
CO2: 23 mmol/L (ref 22–32)
Calcium: 8.5 mg/dL — ABNORMAL LOW (ref 8.9–10.3)
Chloride: 107 mmol/L (ref 98–111)
Creatinine, Ser: 1.13 mg/dL (ref 0.61–1.24)
GFR, Estimated: >60 mL/min
Glucose, Bld: 86 mg/dL (ref 70–99)
Potassium: 4 mmol/L (ref 3.5–5.1)
Sodium: 136 mmol/L (ref 135–145)

## 2020-12-12 LAB — SARS CORONAVIRUS 2 BY RT PCR (HOSPITAL ORDER, PERFORMED IN ~~LOC~~ HOSPITAL LAB): SARS Coronavirus 2: NEGATIVE

## 2020-12-12 LAB — CBC
HCT: 42.5 % (ref 39.0–52.0)
Hemoglobin: 13.7 g/dL (ref 13.0–17.0)
MCH: 28.7 pg (ref 26.0–34.0)
MCHC: 32.2 g/dL (ref 30.0–36.0)
MCV: 88.9 fL (ref 80.0–100.0)
Platelets: 221 K/uL (ref 150–400)
RBC: 4.78 MIL/uL (ref 4.22–5.81)
RDW: 13.4 % (ref 11.5–15.5)
WBC: 5.6 K/uL (ref 4.0–10.5)
nRBC: 0 % (ref 0.0–0.2)

## 2020-12-12 SURGERY — LEFT HEART CATH AND CORONARY ANGIOGRAPHY
Anesthesia: LOCAL

## 2020-12-12 MED ORDER — HEPARIN SODIUM (PORCINE) 1000 UNIT/ML IJ SOLN
INTRAMUSCULAR | Status: DC | PRN
  Administered 2020-12-12: 4000 [IU] via INTRAVENOUS

## 2020-12-12 MED ORDER — SODIUM CHLORIDE 0.9 % IV SOLN: 250.0000 mL | INTRAVENOUS | Status: DC | PRN

## 2020-12-12 MED ORDER — HEPARIN (PORCINE) IN NACL 1000-0.9 UT/500ML-% IV SOLN
INTRAVENOUS | Status: AC
  Filled 2020-12-12: qty 500

## 2020-12-12 MED ORDER — LABETALOL HCL 5 MG/ML IV SOLN: 10.0000 mg | INTRAVENOUS | Status: DC | PRN

## 2020-12-12 MED ORDER — RIVAROXABAN 20 MG PO TABS: ORAL_TABLET | ORAL | 3 refills | Status: DC

## 2020-12-12 MED ORDER — SODIUM CHLORIDE 0.9% FLUSH: 3.0000 mL | Freq: Two times a day (BID) | INTRAVENOUS | Status: DC

## 2020-12-12 MED ORDER — LIDOCAINE HCL (PF) 1 % IJ SOLN
INTRAMUSCULAR | Status: DC | PRN
  Administered 2020-12-12: 2 mL via INTRADERMAL

## 2020-12-12 MED ORDER — ACETAMINOPHEN 325 MG PO TABS: 650.0000 mg | ORAL_TABLET | ORAL | Status: DC | PRN

## 2020-12-12 MED ORDER — VERAPAMIL HCL 2.5 MG/ML IV SOLN
INTRAVENOUS | Status: AC
  Filled 2020-12-12: qty 2

## 2020-12-12 MED ORDER — HEPARIN (PORCINE) IN NACL 1000-0.9 UT/500ML-% IV SOLN
INTRAVENOUS | Status: DC | PRN
  Administered 2020-12-12 (×2): 500 mL

## 2020-12-12 MED ORDER — VERAPAMIL HCL 2.5 MG/ML IV SOLN
INTRAVENOUS | Status: DC | PRN
  Administered 2020-12-12: 10 mL via INTRA_ARTERIAL

## 2020-12-12 MED ORDER — SODIUM CHLORIDE 0.9% FLUSH: 3.0000 mL | INTRAVENOUS | Status: DC | PRN

## 2020-12-12 MED ORDER — SODIUM CHLORIDE 0.9 % IV SOLN: INTRAVENOUS | Status: DC

## 2020-12-12 MED ORDER — MIDAZOLAM HCL 2 MG/2ML IJ SOLN
INTRAMUSCULAR | Status: DC | PRN
  Administered 2020-12-12: 2 mg via INTRAVENOUS

## 2020-12-12 MED ORDER — ASPIRIN 81 MG PO CHEW: 81.0000 mg | CHEWABLE_TABLET | Freq: Every day | ORAL | Status: DC

## 2020-12-12 MED ORDER — HYDRALAZINE HCL 20 MG/ML IJ SOLN: 10.0000 mg | INTRAMUSCULAR | Status: DC | PRN

## 2020-12-12 MED ORDER — IOHEXOL 350 MG/ML SOLN
INTRAVENOUS | Status: DC | PRN
  Administered 2020-12-12: 45 mL

## 2020-12-12 MED ORDER — FENTANYL CITRATE (PF) 100 MCG/2ML IJ SOLN
INTRAMUSCULAR | Status: AC
  Filled 2020-12-12: qty 2

## 2020-12-12 MED ORDER — FENTANYL CITRATE (PF) 100 MCG/2ML IJ SOLN
INTRAMUSCULAR | Status: DC | PRN
  Administered 2020-12-12: 50 ug via INTRAVENOUS

## 2020-12-12 MED ORDER — ONDANSETRON HCL 4 MG/2ML IJ SOLN: 4.0000 mg | Freq: Four times a day (QID) | INTRAMUSCULAR | Status: DC | PRN

## 2020-12-12 MED ORDER — HEPARIN SODIUM (PORCINE) 1000 UNIT/ML IJ SOLN
INTRAMUSCULAR | Status: AC
  Filled 2020-12-12: qty 1

## 2020-12-12 MED ORDER — LIDOCAINE HCL (PF) 1 % IJ SOLN
INTRAMUSCULAR | Status: AC
  Filled 2020-12-12: qty 30

## 2020-12-12 MED ORDER — MIDAZOLAM HCL 2 MG/2ML IJ SOLN
INTRAMUSCULAR | Status: AC
  Filled 2020-12-12: qty 2

## 2020-12-12 SURGICAL SUPPLY — 11 items
CATH INFINITI JR4 5F (CATHETERS) ×2 IMPLANT
CATH OPTITORQUE TIG 4.0 5F (CATHETERS) ×2 IMPLANT
DEVICE RAD COMP TR BAND LRG (VASCULAR PRODUCTS) ×2 IMPLANT
GLIDESHEATH SLEND SS 6F .021 (SHEATH) ×2 IMPLANT
GUIDEWIRE INQWIRE 1.5J.035X260 (WIRE) ×1 IMPLANT
INQWIRE 1.5J .035X260CM (WIRE) ×2
KIT HEART LEFT (KITS) ×2 IMPLANT
PACK CARDIAC CATHETERIZATION (CUSTOM PROCEDURE TRAY) ×2 IMPLANT
SHEATH PROBE COVER 6X72 (BAG) ×2 IMPLANT
TRANSDUCER W/STOPCOCK (MISCELLANEOUS) ×2 IMPLANT
TUBING CIL FLEX 10 FLL-RA (TUBING) ×2 IMPLANT

## 2020-12-12 NOTE — Interval H&P Note (Signed)
Cath Lab Visit (complete for each Cath Lab visit)  Clinical Evaluation Leading to the Procedure:   ACS: No.  Non-ACS:    Anginal Classification: CCS III  Anti-ischemic medical therapy: Maximal Therapy (2 or more classes of medications)  Non-Invasive Test Results: No non-invasive testing performed  Prior CABG: No previous CABG      History and Physical Interval Note:  12/12/2020 10:02 AM  Jose Warner  has presented today for surgery, with the diagnosis of chest pain.  The various methods of treatment have been discussed with the patient and family. After consideration of risks, benefits and other options for treatment, the patient has consented to  Procedure(s): LEFT HEART CATH AND CORONARY ANGIOGRAPHY (N/A) as a surgical intervention.  The patient's history has been reviewed, patient examined, no change in status, stable for surgery.  I have reviewed the patient's chart and labs.  Questions were answered to the patient's satisfaction.     Shelva Majestic

## 2020-12-12 NOTE — Progress Notes (Addendum)
Progress Note  Patient Name: Jose Warner Date of Encounter: 12/12/2020  Primary Cardiologist: Shelva Majestic, MD   Subjective   Jose Warner is resting in bed comfortably with his wife at bedside. He states he has felt better overall, but is still having episodes of centralized chest pain, that it is intermittent and occurs while he is laying in bed. He continues to have left sided jaw numbness, but the neck numbness/left arm tingling has resolved. He did not request any nitroglycerin overnight. Has been NPO since midnight, planning on hearth catheterization today.   Inpatient Medications    Scheduled Meds:  amLODipine  2.5 mg Oral Daily   aspirin EC  81 mg Oral Daily   bisoprolol  2.5 mg Oral Daily   dofetilide  500 mcg Oral BID   enoxaparin (LOVENOX) injection  40 mg Subcutaneous Q24H   ezetimibe  10 mg Oral QPM   mometasone-formoterol  2 puff Inhalation BID   pantoprazole  40 mg Oral BID   pravastatin  80 mg Oral QPM   sodium chloride flush  3 mL Intravenous Q12H   Continuous Infusions:  sodium chloride     sodium chloride 1 mL/kg/hr (12/12/20 0500)   PRN Meds: sodium chloride, acetaminophen, albuterol, nitroGLYCERIN, ondansetron (ZOFRAN) IV, sodium chloride flush   Vital Signs    Vitals:   12/11/20 2100 12/12/20 0008 12/12/20 0459 12/12/20 0700  BP: 136/76 117/68 129/70 107/61  Pulse: (!) 52 (!) 59 (!) 57 (!) 52  Resp: (!) 21 (!) 21 17 18   Temp: 97.9 F (36.6 C) 98.3 F (36.8 C) 97.8 F (36.6 C) 98.2 F (36.8 C)  TempSrc: Oral Oral Oral Oral  SpO2: 99% 99% 95% 96%  Weight: 80.9 kg      No intake or output data in the 24 hours ending 12/12/20 0809 Filed Weights   12/11/20 2100  Weight: 80.9 kg    Telemetry    Sinus bradycardia with Qtc of 395. - Personally Reviewed  ECG    Repeat EKG yesterday evening, sinus bradycardia-. Qtc of 489. Personally Reviewed  Physical Exam   GEN: No acute distress.   Neck: No JVD Cardiac: RRR, no murmurs, rubs, or  gallops.  Respiratory: Clear to auscultation bilaterally. GI: Soft, nontender, non-distended  MS: No edema; No deformity. Neuro:  Nonfocal  Psych: Normal affect   Labs    Chemistry Recent Labs  Lab 12/11/20 1200 12/11/20 1236 12/12/20 0201  NA 136 140 136  K 4.2 4.2 4.0  CL 107 108 107  CO2 19*  --  23  GLUCOSE 96 96 86  BUN 13 14 13   CREATININE 1.13 1.00 1.13  CALCIUM 9.3  --  8.5*  PROT 7.0  --   --   ALBUMIN 3.9  --   --   AST 26  --   --   ALT 31  --   --   ALKPHOS 55  --   --   BILITOT 0.8  --   --   GFRNONAA >60  --  >60  ANIONGAP 10  --  6     Hematology Recent Labs  Lab 12/11/20 1200 12/11/20 1236 12/12/20 0201  WBC 6.5  --  5.6  RBC 5.23  --  4.78  HGB 14.8 15.0 13.7  HCT 46.2 44.0 42.5  MCV 88.3  --  88.9  MCH 28.3  --  28.7  MCHC 32.0  --  32.2  RDW 13.2  --  13.4  PLT  247  --  221    Cardiac Enzymes - negative in last 24 hours  BNP- n/a  DDimer n/a  Radiology    CT Angio Head W/Cm &/Or Wo Cm  Result Date: 12/11/2020 CLINICAL DATA:  Neuro deficit EXAM: CT ANGIOGRAPHY HEAD AND NECK TECHNIQUE: Multidetector CT imaging of the head and neck was performed using the standard protocol during bolus administration of intravenous contrast. Multiplanar CT image reconstructions and MIPs were obtained to evaluate the vascular anatomy. Carotid stenosis measurements (when applicable) are obtained utilizing NASCET criteria, using the distal internal carotid diameter as the denominator. CONTRAST:  18m OMNIPAQUE IOHEXOL 350 MG/ML SOLN COMPARISON:  CT neck 12/29/2017 FINDINGS: CT HEAD FINDINGS Brain: There is no acute intracranial hemorrhage, extra-axial fluid collection, or infarct. The ventricles are not enlarged. There is no midline shift. No mass lesion is identified. Vascular: See below Skull: Normal. Negative for fracture or focal lesion. Sinuses and orbits: The imaged paranasal sinuses are clear. Bilateral lens implants are in place. The globes and orbits  are otherwise unremarkable. Other: Mastoid air cells are clear. There is cerumen in the left external auditory canal. Review of the MIP images confirms the above findings CTA NECK FINDINGS Aortic arch: There is minimal calcified atherosclerotic plaque of the aortic arch. The aortic arch is otherwise unremarkable. Right carotid system: There is mild calcified atherosclerotic plaque at the right carotid bulb without hemodynamically significant stenosis or occlusion. There is no evidence of dissection or aneurysm. Left carotid system: There is mild calcified atherosclerotic plaque of the left carotid bulb without hemodynamically significant stenosis or occlusion. There is no dissection or aneurysm. The left internal carotid artery takes a medialized/retropharyngeal course. Vertebral arteries: There is mild calcified atherosclerotic plaque in the proximal right vertebral artery resulting in up to mild stenosis. The right vertebral artery is dominant, a normal variant. The left vertebral artery is relatively diminutive but patent Skeleton: There is no acute osseous abnormality. There is mild degenerative change of the cervical spine. Other neck: The soft tissues are unremarkable. Upper chest: The lung apices are clear. Review of the MIP images confirms the above findings CTA HEAD FINDINGS Anterior circulation: There is mild calcified atherosclerotic plaque of the bilateral cavernous ICAs without hemodynamically significant stenosis or occlusion. The bilateral MCAs and ACAs are patent. There is no aneurysm. Posterior circulation: There is a PICA termination of the left vertebral artery, a normal variant. The right vertebral artery is patent. The basilar artery is patent. The bilateral PCAs are patent. Venous sinuses: As permitted by contrast timing, patent. Anatomic variants: As above. Review of the MIP images confirms the above findings IMPRESSION: 1. No acute intracranial pathology. 2. Mild calcified atherosclerotic  plaque of the bilateral carotid bulbs and cavernous ICAs without hemodynamically significant stenosis or occlusion. No aneurysm identified. Electronically Signed   By: PValetta MoleM.D.   On: 12/11/2020 14:27   DG Chest 2 View  Result Date: 12/11/2020 CLINICAL DATA:  Chest pain. EXAM: CHEST - 2 VIEW COMPARISON:  12/04/2020. FINDINGS: The heart size and mediastinal contours are within normal limits. Low lung volumes. No consolidation. No visible pleural effusions or pneumothorax. No acute osseous abnormality. IMPRESSION: No active cardiopulmonary disease. Electronically Signed   By: FMargaretha SheffieldM.D.   On: 12/11/2020 12:08   CT Angio Neck W and/or Wo Contrast  Result Date: 12/11/2020 CLINICAL DATA:  Neuro deficit EXAM: CT ANGIOGRAPHY HEAD AND NECK TECHNIQUE: Multidetector CT imaging of the head and neck was performed using the standard protocol  during bolus administration of intravenous contrast. Multiplanar CT image reconstructions and MIPs were obtained to evaluate the vascular anatomy. Carotid stenosis measurements (when applicable) are obtained utilizing NASCET criteria, using the distal internal carotid diameter as the denominator. CONTRAST:  131m OMNIPAQUE IOHEXOL 350 MG/ML SOLN COMPARISON:  CT neck 12/29/2017 FINDINGS: CT HEAD FINDINGS Brain: There is no acute intracranial hemorrhage, extra-axial fluid collection, or infarct. The ventricles are not enlarged. There is no midline shift. No mass lesion is identified. Vascular: See below Skull: Normal. Negative for fracture or focal lesion. Sinuses and orbits: The imaged paranasal sinuses are clear. Bilateral lens implants are in place. The globes and orbits are otherwise unremarkable. Other: Mastoid air cells are clear. There is cerumen in the left external auditory canal. Review of the MIP images confirms the above findings CTA NECK FINDINGS Aortic arch: There is minimal calcified atherosclerotic plaque of the aortic arch. The aortic arch is  otherwise unremarkable. Right carotid system: There is mild calcified atherosclerotic plaque at the right carotid bulb without hemodynamically significant stenosis or occlusion. There is no evidence of dissection or aneurysm. Left carotid system: There is mild calcified atherosclerotic plaque of the left carotid bulb without hemodynamically significant stenosis or occlusion. There is no dissection or aneurysm. The left internal carotid artery takes a medialized/retropharyngeal course. Vertebral arteries: There is mild calcified atherosclerotic plaque in the proximal right vertebral artery resulting in up to mild stenosis. The right vertebral artery is dominant, a normal variant. The left vertebral artery is relatively diminutive but patent Skeleton: There is no acute osseous abnormality. There is mild degenerative change of the cervical spine. Other neck: The soft tissues are unremarkable. Upper chest: The lung apices are clear. Review of the MIP images confirms the above findings CTA HEAD FINDINGS Anterior circulation: There is mild calcified atherosclerotic plaque of the bilateral cavernous ICAs without hemodynamically significant stenosis or occlusion. The bilateral MCAs and ACAs are patent. There is no aneurysm. Posterior circulation: There is a PICA termination of the left vertebral artery, a normal variant. The right vertebral artery is patent. The basilar artery is patent. The bilateral PCAs are patent. Venous sinuses: As permitted by contrast timing, patent. Anatomic variants: As above. Review of the MIP images confirms the above findings IMPRESSION: 1. No acute intracranial pathology. 2. Mild calcified atherosclerotic plaque of the bilateral carotid bulbs and cavernous ICAs without hemodynamically significant stenosis or occlusion. No aneurysm identified. Electronically Signed   By: PValetta MoleM.D.   On: 12/11/2020 14:27   CT Angio Chest Aorta w/CM &/OR wo/CM  Result Date: 12/11/2020 CLINICAL DATA:   Chest pain, back pain suspected aortic dissection in a 70year old male. EXAM: CT ANGIOGRAPHY CHEST WITHOUT AND WITH CONTRAST TECHNIQUE: Multidetector CT imaging of the chest was performed using the standard protocol prior to and during bolus administration of intravenous contrast. Multiplanar CT image reconstructions and MIPs were obtained to evaluate the vascular anatomy. CONTRAST:  1059mOMNIPAQUE IOHEXOL 350 MG/ML SOLN COMPARISON:  CT angiography of the neck of December 12, 2019. CT of the chest dated April 29, 2018. FINDINGS: Cardiovascular:  Pre contrast imaging shows no intramural hematoma. Ascending aortic caliber 3.3 cm within 1 mm of previous imaging from 2020. No sign of vasculitis, dissection or aneurysm. Heart size top normal without substantial pericardial fluid. Signs of calcified coronary artery disease. Limited opacification of normal caliber pulmonary vasculature. Mediastinum/Nodes: Esophagus minimally patulous. No thoracic inlet, axillary or mediastinal adenopathy. No signs of hilar lymphadenopathy. Lungs/Pleura: Basilar atelectasis. No effusion. No consolidative  changes. Airways are patent. Upper Abdomen: Stable cyst in the LEFT hepatic lobe. Signs of hepatic steatosis. No pericholecystic stranding. The imaged portions of pancreas, spleen and adrenal glands are unremarkable. Cyst in the LEFT kidney, upper pole similar to prior imaging. No upper abdominal lymphadenopathy. Visualized gastrointestinal structures are unremarkable. Musculoskeletal: No acute musculoskeletal finding. Spinal degenerative changes. Review of the MIP images confirms the above findings. IMPRESSION: 1. No evidence of thoracic aortic aneurysm or dissection. 2. Signs of calcified coronary artery disease. 3. Signs of hepatic steatosis. 4. Stable LEFT hepatic and LEFT renal cysts. 5. Aortic atherosclerosis. Aortic Atherosclerosis (ICD10-I70.0). Electronically Signed   By: Zetta Bills M.D.   On: 12/11/2020 14:38    Cardiac  Studies   RHC 2006 Normal ventricular function Mild, not significant obstructive coronary artery disease with luminal irregularity of the left anterior descending and narrowings of 30% to 40% in the proximal to mid segment with 20% in the midsegment; and 20% proximal left circumflex narrowing.  Also found to have mild 20% right common iliac narrowing.   Myocardial Perfusion Imaging 2009 Mild perfusion defect in the basal inferior and mid inferior and basal to inferolateral regions.  Consistent with attenuation artifact.  Abnormal study.  Post-rest EF of 67%.  Global left ventricular systolic function was normal.  There was no significant change from prior study and no significant ischemic demonstration.  Low risk scan.   LHC 05/2017 Acute coronary syndrome with a pattern of unstable angina due to 95% mid LAD obstruction within the diffusely diseased/calcified segment. Normal left main Normal circumflex Normal right coronary Normal left ventricular function with EF estimated to be 60%.  LVEDP is normal. Successful angioplasty and stenting of the LAD 95% stenosis to 0% using a 16 mm x 2.75 mm Synergy postdilated to high pressure with a 2.75 balloon resulting in TIMI grade III flow and proximal and distal step up.   LHC 07/2017 Mid LAD lesion is 40% stenosed. Prox LAD-1 lesion is 30% stenosed. Prox LAD-2 lesion is 40% stenosed. Previously placed Prox LAD-3 stent (unknown type) is widely patent. The left ventricular systolic function is normal. LV end diastolic pressure is normal. The left ventricular ejection fraction is 55-65% by visual estimate.   TTE 2020 Left ventricular ejection fraction, by visual estimation, is 55 to  60%. The left ventricle has normal function. Normal left ventricular size.  There is mildly increased left ventricular hypertrophy.  Elevated left ventricular end-diastolic pressure.  Left ventricular diastolic Doppler parameters are consistent with   pseudonormalization pattern of LV diastolic filling.  Global right ventricle has normal systolic function.The right  ventricular size is normal. No increase in right ventricular wall  thickness.  Left atrial size was moderately dilated.  Right atrial size was normal.  The mitral valve is normal in structure. Mild mitral valve  regurgitation. No evidence of mitral stenosis.  The tricuspid valve is normal in structure. Tricuspid valve  regurgitation is mild.  The aortic valve is normal in structure. Aortic valve regurgitation is  mild to moderate by color flow Doppler. Structurally normal aortic valve,  with no evidence of sclerosis or stenosis.  The pulmonic valve was normal in structure. Pulmonic valve  regurgitation is not visualized by color flow Doppler.  The inferior vena cava is normal in size with greater than 50%  respiratory variability, suggesting right atrial pressure of 3 mmHg.   LHC 2022 Ost LAD to Prox LAD lesion is 30% stenosed. Mid LAD to Dist LAD lesion is 30% stenosed.  Previously placed Mid LAD stent (unknown type) is  widely patent. LV end diastolic pressure is low. The left ventricular ejection fraction is 55-65% by visual estimate.   Patient Profile     Jose Warner is a 70 y.o. male with CAD status post PCI, atrial fibrillation on Xarelto, idiopathic pericarditis, asthma, candida esophagitis, celiac disease, GERD, prostate cancer in remission, esophageal spasm who presented to the ED with central chest pain and worsening shortness of breath for the past week.  Assessment & Plan    Chest Pain Hx of CAD s/p mid LAD PCI 2019 Patient continued to have similar episodes of chest pain overnight while resting in bed. They were intermittent and not as severe as the pain prior to admission. Cardiac catheterization without any occlusion amendable to PCI. Reassuring and do not believe his chest pain is of cardiac etiology. Will continue medical management for his CAD.  For further chest pain episodes patient will need to follow up with PCP for further workup of his non-cardiac chest pain. -continue aspirin daily -nitroglycerin PRN for chest pain -continue home bisoprolol -HLD/DM management as per below -follow up PCP within a week for further evaluation of non-cardiac chest pain  Atrial Fibrillation Dx in 2019. S/p DC cardioversion 2020 however had recurrent disease. Stable on tikosyn and xarelto. Plan for patient to restart xarelto tomorrow evening.  -continue Tikosyn 500 mg BID -restart xarelto tomorrow evening -magnesium level normal -continue to follow with Dr. Claiborne Billings on outpatient basis   Hx of Celiac Disease Last celiac attack yesterday, no episodes of diarrhea today.  -continue to monitor -Gluten free diet during admission   Asthma Patient without wheezing on examination. Continue home inhalers during admission.    GERD Continue protonix 40 mg BID.   For questions or updates, please contact [place] Please consult [email or web address] for contact info under Cardiology/STEMI.    Signed, Sanjuana Letters, MD  Internal Medicine Resident PGY-2 12/12/2020, 8:09 AM     Please see DC summary this same date. Thanks  Sherren Mocha 12/12/2020 3:26 PM

## 2020-12-12 NOTE — Discharge Instructions (Signed)
Thank you for allowing [place] to care for you during your hospitalization. You were admitted for your recurrent episodes of chest pain and had a cardiac catheterization performed. There were not occlusions and no worsening from your prior study. We will continue you on your medications and have you restart your xarelto tomorrow evening. Please follow up with your primary care provider for your non-cardiac chest pain.   Radial Site Care Refer to this sheet in the next few weeks. These instructions provide you with information on caring for yourself after your procedure. Your caregiver may also give you more specific instructions. Your treatment has been planned according to current medical practices, but problems sometimes occur. Call your caregiver if you have any problems or questions after your procedure. HOME CARE INSTRUCTIONS You may shower the day after the procedure. Remove the bandage (dressing) and gently wash the site with plain soap and water. Gently pat the site dry.  Do not apply powder or lotion to the site.  Do not submerge the affected site in water for 3 to 5 days.  Inspect the site at least twice daily.  Do not flex or bend the affected arm for 24 hours.  No lifting over 5 pounds (2.3 kg) for 5 days after your procedure.  Do not drive home if you are discharged the same day of the procedure. Have someone else drive you.  You may drive 24 hours after the procedure unless otherwise instructed by your caregiver.  What to expect: Any bruising will usually fade within 1 to 2 weeks.  Blood that collects in the tissue (hematoma) may be painful to the touch. It should usually decrease in size and tenderness within 1 to 2 weeks.  SEEK IMMEDIATE MEDICAL CARE IF: You have unusual pain at the radial site.  You have redness, warmth, swelling, or pain at the radial site.  You have drainage (other than a small amount of blood on the dressing).  You have chills.  You have a fever or persistent  symptoms for more than 72 hours.  You have a fever and your symptoms suddenly get worse.  Your arm becomes pale, cool, tingly, or numb.  You have heavy bleeding from the site. Hold pressure on the site.

## 2020-12-12 NOTE — Discharge Summary (Addendum)
Discharge Summary    Patient ID: Jose Warner MRN: [id number]; DOB: Oct 24, 1950  Admit date: 12/11/2020 Discharge date: 12/12/2020  Primary Care Provider: Derinda Late, MD  Primary Cardiologist: Shelva Majestic, MD  Primary Electrophysiologist:  None   Discharge Diagnoses    Active Problems:   Chest pain   Coronary artery disease involving native coronary artery of native heart with angina pectoris [facility])   Diagnostic Studies/Procedures    LHC 2022 Ost LAD to Prox LAD lesion is 30% stenosed. Mid LAD to Dist LAD lesion is 30% stenosed. Previously placed Mid LAD stent (unknown type) is  widely patent. LV end diastolic pressure is low. The left ventricular ejection fraction is 55-65% by visual estimate. _____________   History of Present Illness     Jose Warner is a 70 y.o. male with  CAD status post PCI, atrial fibrillation on Xarelto, idiopathic pericarditis, asthma, candida esophagitis, celiac disease, GERD, prostate cancer in remission, esophageal spasm who presented to the ED with central chest pain and worsening shortness of breath for the past week.  Hospital Course       Patient presented to the ED with centralized chest pain that had been occurring with exertion and relieved with rest. He had associated shortness of breath for the past week with mild exertion. He was admitted for ACS rule out, high sensitivity troponins were negative and EKG was without changes. He had a LHC performed by Dr. Claiborne Billings and had no obstruction, his prior LAD stent was widely patent. He tolerated the procedure well and had no complications. He was discharged home and instructed to follow up with his PCP for his non-cardiac chest pain symptoms. His xarelto was held the day of discharge and the patient was instructed to restart this the following day. No further medication changes made and no tests for patient to follow up with.   Did the patient have an acute coronary syndrome (MI, NSTEMI,  STEMI, etc) this admission?:  No                               Did the patient have a percutaneous coronary intervention (stent / angioplasty)?:  No.     CBC Recent Labs    12/11/20 1200 12/11/20 1236 12/12/20 0201  WBC 6.5  --  5.6  NEUTROABS 3.6  --   --   HGB 14.8 15.0 13.7  HCT 46.2 44.0 42.5  MCV 88.3  --  88.9  PLT 247  --  638   Basic Metabolic Panel Recent Labs    12/11/20 1200 12/11/20 1236 12/11/20 1830 12/12/20 0201  NA 136 140  --  136  K 4.2 4.2  --  4.0  CL 107 108  --  107  CO2 19*  --   --  23  GLUCOSE 96 96  --  86  BUN 13 14  --  13  CREATININE 1.13 1.00  --  1.13  CALCIUM 9.3  --   --  8.5*  MG  --   --  2.1  --    Liver Function Tests Recent Labs    12/11/20 1200  AST 26  ALT 31  ALKPHOS 55  BILITOT 0.8  PROT 7.0  ALBUMIN 3.9   No results for input(s): LIPASE, AMYLASE in the last 72 hours. High Sensitivity Troponin:   Recent Labs  Lab 12/04/20 1747 12/04/20 1943 12/11/20 1200 12/11/20 1336  TROPONINIHS 7 9  7 7    BNP Invalid input(s): POCBNP D-Dimer No results for input(s): DDIMER in the last 72 hours. Hemoglobin A1C No results for input(s): HGBA1C in the last 72 hours. Fasting Lipid Panel Recent Labs    12/12/20 0201  CHOL 147  HDL 40*  LDLCALC 77  TRIG 150*  CHOLHDL 3.7   Thyroid Function Tests No results for input(s): TSH, T4TOTAL, T3FREE, THYROIDAB in the last 72 hours.  Invalid input(s): FREET3 _____________  CT Angio Head W/Cm &/Or Wo Cm  Result Date: 12/11/2020 CLINICAL DATA:  Neuro deficit EXAM: CT ANGIOGRAPHY HEAD AND NECK TECHNIQUE: Multidetector CT imaging of the head and neck was performed using the standard protocol during bolus administration of intravenous contrast. Multiplanar CT image reconstructions and MIPs were obtained to evaluate the vascular anatomy. Carotid stenosis measurements (when applicable) are obtained utilizing NASCET criteria, using the distal internal carotid diameter as the denominator.  CONTRAST:  130m OMNIPAQUE IOHEXOL 350 MG/ML SOLN COMPARISON:  CT neck 12/29/2017 FINDINGS: CT HEAD FINDINGS Brain: There is no acute intracranial hemorrhage, extra-axial fluid collection, or infarct. The ventricles are not enlarged. There is no midline shift. No mass lesion is identified. Vascular: See below Skull: Normal. Negative for fracture or focal lesion. Sinuses and orbits: The imaged paranasal sinuses are clear. Bilateral lens implants are in place. The globes and orbits are otherwise unremarkable. Other: Mastoid air cells are clear. There is cerumen in the left external auditory canal. Review of the MIP images confirms the above findings CTA NECK FINDINGS Aortic arch: There is minimal calcified atherosclerotic plaque of the aortic arch. The aortic arch is otherwise unremarkable. Right carotid system: There is mild calcified atherosclerotic plaque at the right carotid bulb without hemodynamically significant stenosis or occlusion. There is no evidence of dissection or aneurysm. Left carotid system: There is mild calcified atherosclerotic plaque of the left carotid bulb without hemodynamically significant stenosis or occlusion. There is no dissection or aneurysm. The left internal carotid artery takes a medialized/retropharyngeal course. Vertebral arteries: There is mild calcified atherosclerotic plaque in the proximal right vertebral artery resulting in up to mild stenosis. The right vertebral artery is dominant, a normal variant. The left vertebral artery is relatively diminutive but patent Skeleton: There is no acute osseous abnormality. There is mild degenerative change of the cervical spine. Other neck: The soft tissues are unremarkable. Upper chest: The lung apices are clear. Review of the MIP images confirms the above findings CTA HEAD FINDINGS Anterior circulation: There is mild calcified atherosclerotic plaque of the bilateral cavernous ICAs without hemodynamically significant stenosis or occlusion.  The bilateral MCAs and ACAs are patent. There is no aneurysm. Posterior circulation: There is a PICA termination of the left vertebral artery, a normal variant. The right vertebral artery is patent. The basilar artery is patent. The bilateral PCAs are patent. Venous sinuses: As permitted by contrast timing, patent. Anatomic variants: As above. Review of the MIP images confirms the above findings IMPRESSION: 1. No acute intracranial pathology. 2. Mild calcified atherosclerotic plaque of the bilateral carotid bulbs and cavernous ICAs without hemodynamically significant stenosis or occlusion. No aneurysm identified. Electronically Signed   By: PValetta MoleM.D.   On: 12/11/2020 14:27   DG Chest 2 View  Result Date: 12/11/2020 CLINICAL DATA:  Chest pain. EXAM: CHEST - 2 VIEW COMPARISON:  12/04/2020. FINDINGS: The heart size and mediastinal contours are within normal limits. Low lung volumes. No consolidation. No visible pleural effusions or pneumothorax. No acute osseous abnormality. IMPRESSION: No active cardiopulmonary  disease. Electronically Signed   By: Margaretha Sheffield M.D.   On: 12/11/2020 12:08   DG Chest 2 View  Result Date: 12/04/2020 CLINICAL DATA:  Chest pain for 1 hour EXAM: CHEST - 2 VIEW COMPARISON:  08/23/2020 FINDINGS: Cardiac shadow is within normal limits. The lungs are well aerated bilaterally. No focal infiltrate or effusion is seen. No bony abnormality is noted. IMPRESSION: No active cardiopulmonary disease. Electronically Signed   By: Inez Catalina M.D.   On: 12/04/2020 17:48   CT Angio Neck W and/or Wo Contrast  Result Date: 12/11/2020 CLINICAL DATA:  Neuro deficit EXAM: CT ANGIOGRAPHY HEAD AND NECK TECHNIQUE: Multidetector CT imaging of the head and neck was performed using the standard protocol during bolus administration of intravenous contrast. Multiplanar CT image reconstructions and MIPs were obtained to evaluate the vascular anatomy. Carotid stenosis measurements (when  applicable) are obtained utilizing NASCET criteria, using the distal internal carotid diameter as the denominator. CONTRAST:  145m OMNIPAQUE IOHEXOL 350 MG/ML SOLN COMPARISON:  CT neck 12/29/2017 FINDINGS: CT HEAD FINDINGS Brain: There is no acute intracranial hemorrhage, extra-axial fluid collection, or infarct. The ventricles are not enlarged. There is no midline shift. No mass lesion is identified. Vascular: See below Skull: Normal. Negative for fracture or focal lesion. Sinuses and orbits: The imaged paranasal sinuses are clear. Bilateral lens implants are in place. The globes and orbits are otherwise unremarkable. Other: Mastoid air cells are clear. There is cerumen in the left external auditory canal. Review of the MIP images confirms the above findings CTA NECK FINDINGS Aortic arch: There is minimal calcified atherosclerotic plaque of the aortic arch. The aortic arch is otherwise unremarkable. Right carotid system: There is mild calcified atherosclerotic plaque at the right carotid bulb without hemodynamically significant stenosis or occlusion. There is no evidence of dissection or aneurysm. Left carotid system: There is mild calcified atherosclerotic plaque of the left carotid bulb without hemodynamically significant stenosis or occlusion. There is no dissection or aneurysm. The left internal carotid artery takes a medialized/retropharyngeal course. Vertebral arteries: There is mild calcified atherosclerotic plaque in the proximal right vertebral artery resulting in up to mild stenosis. The right vertebral artery is dominant, a normal variant. The left vertebral artery is relatively diminutive but patent Skeleton: There is no acute osseous abnormality. There is mild degenerative change of the cervical spine. Other neck: The soft tissues are unremarkable. Upper chest: The lung apices are clear. Review of the MIP images confirms the above findings CTA HEAD FINDINGS Anterior circulation: There is mild  calcified atherosclerotic plaque of the bilateral cavernous ICAs without hemodynamically significant stenosis or occlusion. The bilateral MCAs and ACAs are patent. There is no aneurysm. Posterior circulation: There is a PICA termination of the left vertebral artery, a normal variant. The right vertebral artery is patent. The basilar artery is patent. The bilateral PCAs are patent. Venous sinuses: As permitted by contrast timing, patent. Anatomic variants: As above. Review of the MIP images confirms the above findings IMPRESSION: 1. No acute intracranial pathology. 2. Mild calcified atherosclerotic plaque of the bilateral carotid bulbs and cavernous ICAs without hemodynamically significant stenosis or occlusion. No aneurysm identified. Electronically Signed   By: PValetta MoleM.D.   On: 12/11/2020 14:27   CARDIAC CATHETERIZATION  Result Date: 12/12/2020   Ost LAD to Prox LAD lesion is 30% stenosed.   Mid LAD to Dist LAD lesion is 30% stenosed.   Previously placed Mid LAD stent (unknown type) is  widely patent.   LV end  diastolic pressure is low.   The left ventricular ejection fraction is 55-65% by visual estimate. Mild nonobstructive residual CAD with mild to moderate proximal to mid LAD calcification with 30% smooth proximal stenosis, widely patent LAD stent, mild 30% smooth stenosis beyond the stented segment; normal left circumflex and normal dominant RCA. Normal LV function with EF estimated at 55 to 65% without focal segmental wall motion abnormalities.  LVEDP 8 mmHg. RECOMMENDATION: Suspect nonischemic chest pain.  Patient is currently in sinus rhythm.  Recommend resumption of Xarelto tomorrow.  Probably okay for discharge later today.  Continue aggressive lipid-lowering therapy with target LDL less than 70 and optimal blood pressure control.   CT Angio Chest Aorta w/CM &/OR wo/CM  Result Date: 12/11/2020 CLINICAL DATA:  Chest pain, back pain suspected aortic dissection in a 70 year old male. EXAM: CT  ANGIOGRAPHY CHEST WITHOUT AND WITH CONTRAST TECHNIQUE: Multidetector CT imaging of the chest was performed using the standard protocol prior to and during bolus administration of intravenous contrast. Multiplanar CT image reconstructions and MIPs were obtained to evaluate the vascular anatomy. CONTRAST:  182m OMNIPAQUE IOHEXOL 350 MG/ML SOLN COMPARISON:  CT angiography of the neck of December 12, 2019. CT of the chest dated April 29, 2018. FINDINGS: Cardiovascular:  Pre contrast imaging shows no intramural hematoma. Ascending aortic caliber 3.3 cm within 1 mm of previous imaging from 2020. No sign of vasculitis, dissection or aneurysm. Heart size top normal without substantial pericardial fluid. Signs of calcified coronary artery disease. Limited opacification of normal caliber pulmonary vasculature. Mediastinum/Nodes: Esophagus minimally patulous. No thoracic inlet, axillary or mediastinal adenopathy. No signs of hilar lymphadenopathy. Lungs/Pleura: Basilar atelectasis. No effusion. No consolidative changes. Airways are patent. Upper Abdomen: Stable cyst in the LEFT hepatic lobe. Signs of hepatic steatosis. No pericholecystic stranding. The imaged portions of pancreas, spleen and adrenal glands are unremarkable. Cyst in the LEFT kidney, upper pole similar to prior imaging. No upper abdominal lymphadenopathy. Visualized gastrointestinal structures are unremarkable. Musculoskeletal: No acute musculoskeletal finding. Spinal degenerative changes. Review of the MIP images confirms the above findings. IMPRESSION: 1. No evidence of thoracic aortic aneurysm or dissection. 2. Signs of calcified coronary artery disease. 3. Signs of hepatic steatosis. 4. Stable LEFT hepatic and LEFT renal cysts. 5. Aortic atherosclerosis. Aortic Atherosclerosis (ICD10-I70.0). Electronically Signed   By: GZetta BillsM.D.   On: 12/11/2020 14:38   Disposition   Pt is being discharged home today in good condition.  Follow-up Plans &  Appointments     Follow-up Information     BDerinda Late MD Follow up.   Specialty: Family Medicine Why: please call and schedule an appointment within 1 week Contact information: [phone]         KTroy Sine MD .   Specialty: Cardiology Contact information: [address] [place]renG[place] [phone]               Discharge Instructions     (HHope Call MD:  Anytime you have any of the following symptoms: 1) 3 pound weight gain in 24 hours or 5 pounds in 1 week 2) shortness of breath, with or without a dry hacking cough 3) swelling in the hands, feet or stomach 4) if you have to sleep on extra pillows at night in order to breathe.   Complete by: As directed    Call MD for:  difficulty breathing, headache or visual disturbances   Complete by: As directed  Call MD for:  extreme fatigue   Complete by: As directed    Call MD for:  persistant nausea and vomiting   Complete by: As directed    Call MD for:  redness, tenderness, or signs of infection (pain, swelling, redness, odor or green/yellow discharge around incision site)   Complete by: As directed    Call MD for:  severe uncontrolled pain   Complete by: As directed    Call MD for:  temperature >100.4   Complete by: As directed    Diet - low sodium heart healthy   Complete by: As directed    Increase activity slowly   Complete by: As directed        Discharge Medications   Allergies as of 12/12/2020       Reactions   Doxycycline Hives   Gluten Meal Other (See Comments)   Celiac, stomach crams , gas   Wheat Bran Other (See Comments)   Celiac   Statins    Leg muscle pains - rosuvastatin, atorvastatin, zetia. Tolerates simvastatin   Sulfa Antibiotics Rash        Medication List     STOP taking these medications    isosorbide mononitrate 30 MG 24 hr tablet Commonly known as: IMDUR       TAKE these  medications    amLODipine 2.5 MG tablet Commonly known as: NORVASC Take 1 tablet (2.5 mg total) by mouth daily.   aspirin EC 81 MG tablet Take 1 tablet (81 mg total) by mouth daily.   b complex vitamins tablet Take 1 tablet by mouth daily with supper.   bisoprolol 5 MG tablet Commonly known as: ZEBETA Take 2.5 mg by mouth daily.   budesonide-formoterol 80-4.5 MCG/ACT inhaler Commonly known as: SYMBICORT SMARTSIG:By Mouth   dofetilide 500 MCG capsule Commonly known as: TIKOSYN Take 1 capsule (500 mcg total) by mouth 2 (two) times daily.   ezetimibe 10 MG tablet Commonly known as: ZETIA Take 10 mg by mouth every evening.   fluticasone 50 MCG/ACT nasal spray Commonly known as: FLONASE Place 1 spray into both nostrils as needed for allergies.   guaiFENesin 600 MG 12 hr tablet Commonly known as: MUCINEX Take 600 mg by mouth daily.   multivitamin with minerals Tabs tablet Take 1 tablet by mouth daily with supper.   nitroGLYCERIN 0.4 MG SL tablet Commonly known as: NITROSTAT Place 1 tablet (0.4 mg total) under the tongue every 5 (five) minutes x 3 doses as needed for chest pain.   pantoprazole 40 MG tablet Commonly known as: PROTONIX Take 40 mg by mouth 2 (two) times daily.   pravastatin 80 MG tablet Commonly known as: PRAVACHOL Take 80 mg by mouth every evening.   ProAir HFA 108 (90 Base) MCG/ACT inhaler Generic drug: albuterol Take 2 puffs by mouth every 4 (four) hours as needed for wheezing or shortness of breath. For shortness of breath.   rivaroxaban 20 MG Tabs tablet Commonly known as: Xarelto TAKE 1 TABLET DAILY WITH SUPPER Start taking on: December 13, 2020   Vitamin D (Ergocalciferol) 50 MCG (2000 UT) Caps Take 2,000 Units by mouth at bedtime.   ZICAM COLD REMEDY PO Take 1 tablet by mouth in the morning and at bedtime.           Outstanding Labs/Studies   None  Duration of Discharge Encounter   Greater than 30 minutes including physician  time.  [place]  Internal Medicine Resident PGY-2 [place]  Pager: [phone]  Patient seen, examined. Available data reviewed. Agree with findings, assessment, and plan as outlined by Dr Johnney Ou.  The patient is independently interviewed and examined.  He is alert, oriented, in no distress.  Neck: JVP normal, lungs are clear to auscultation bilaterally, heart is regular rate and rhythm with no murmur or gallop, right radial site is clear with no ecchymosis or hematoma, abdomen is soft and nontender, extremities have no edema.  The patient's cardiac catheterization films were reviewed and demonstrate patency of his LAD stent and mild plaquing without significant stenoses in the other vessels.  He has undergone a CT angiogram of the chest that showed no acute abnormality.  I think he is medically stable for hospital discharge with plans as outlined above.  We have recommended that he resume rivaroxaban tomorrow evening at his normal dosing schedule.  I think it would be a good idea for him to have an outpatient GI evaluation.  He plans to reach out to her gastroenterologist in [place] who has taken care of his daughter and they prefer to follow-up because of the excellent care that he has provided to the patient's family.  Sherren Mocha, M.D. 12/12/2020 3:27 PM

## 2020-12-12 NOTE — Care Management Obs Status (Signed)
[place] NOTIFICATION   Patient Details  Name: Jose Warner MRN: [id number] Date of Birth: Jul 08, 1950   Medicare Observation Status Notification Given:  Yes    Bethena Roys, RN 12/12/2020, 3:09 PM

## 2020-12-12 NOTE — Care Management CC44 (Signed)
Condition Code 44 Documentation Completed  Patient Details  Name: Jose Warner MRN: [id number] Date of Birth: [birth year]   Condition Code 44 given:  Yes Patient signature on Condition Code 44 notice:  Yes Documentation of 2 MD's agreement:  Yes Code 44 added to claim:  Yes    Bethena Roys, RN 12/12/2020, 3:09 PM

## 2020-12-27 ENCOUNTER — Encounter: Payer: Self-pay | Admitting: Physician Assistant

## 2020-12-27 ENCOUNTER — Ambulatory Visit (INDEPENDENT_AMBULATORY_CARE_PROVIDER_SITE_OTHER): Payer: Medicare Other | Admitting: Physician Assistant

## 2020-12-27 ENCOUNTER — Other Ambulatory Visit: Payer: Self-pay

## 2020-12-27 VITALS — BP 110/52 | HR 58 | Ht 68.0 in | Wt 182.6 lb

## 2020-12-27 DIAGNOSIS — I2510 Atherosclerotic heart disease of native coronary artery without angina pectoris: Secondary | ICD-10-CM

## 2020-12-27 DIAGNOSIS — E785 Hyperlipidemia, unspecified: Secondary | ICD-10-CM

## 2020-12-27 DIAGNOSIS — I4819 Other persistent atrial fibrillation: Secondary | ICD-10-CM

## 2020-12-27 NOTE — Progress Notes (Signed)
Cardiology Office Note:    Date:  12/29/2020   ID:  Jose Warner, DOB June 13, 1950, MRN [id number]  PCP:  Jose Late, MD   [facility] Providers Cardiologist:  Jose Majestic, MD     Referring MD: Jose Late, MD   Chief Complaint  Patient presents with   Follow-up    Seen for Dr. Claiborne Warner     History of Present Illness:    Jose Warner is a 71 y.o. male with a hx of persistent A. Fib on Xarelto, idiopathic pericarditis, Candida esophagitis, celiac disease, hyperlipidemia, CAD, and prostate cancer.  He was loaded on dofetilide in September 2020.  He converted to sinus rhythm on Jose Warner.  More recently, patient presented to the hospital on 12/12/2018 with chest pain.  CTA showed no evidence of aneurysm or dissection.  There was signs of hepatic steatosis and calcified coronary artery disease.  Stable left hepatic and left renal cysts.  Troponin was negative.  Cardiac catheterization performed on 12/12/2020 showed 30% ostial to proximal LAD lesion, 30% mid to distal LAD lesion, widely patent mid LAD stent, EF 55 to 65%.  Normal left circumflex artery and normal dominant RCA.  Xarelto was restarted on the following day.  Patient's chest pain was felt to be noncardiac in nature.  Patient presents today for follow-up.  He still has occasional pain however the location of the pain is inconsistent.  He denies any chest discomfort with ambulation.  He is currently having left flank pain.  It is not worse with palpation or body rotation.  It is not worse with deep inspiration.  Given the recent reassuring cardiac catheterization, I would not recommend any further work-up.  He has no lower extremity edema, orthopnea or PND.  He can follow-up with Dr. Claiborne Warner in 4 months.  Past Medical History:  Diagnosis Date   Anemia    Bronchitis 05-10-12   past fall- 4 runs antibiotics due to bronchitis-uses Pro Air as needed   Celiac disease    Colon polyps    adenomatous   Coronary artery disease     Diverticulosis    GERD (gastroesophageal reflux disease)    Hepatitis 05-10-12   "was told non A, non B"-unclean dental equip.   Hyperlipidemia    Iron deficiency anemia    Pericarditis    Persistent atrial fibrillation (Jose Warner) 06/05/2017   Prostate cancer (Jose Warner) 05-10-12   ;bx. 04-12-12, dx   Spasm of esophagus 2013    Past Surgical History:  Procedure Laterality Date   CARDIAC CATHETERIZATION  09/13/2004   LAD:30%-40% in prox. to mid segment with 20% in the mid segment and 20% in left Circ., mild 20% right common iliac narrowing. medical therapy   CARDIOVERSION  06/05/2017   CARDIOVERSION N/A 11/11/2018   Procedure: CARDIOVERSION;  Surgeon: Jose Casino, MD;  Location: [facility];  Service: Cardiovascular;  Laterality: N/A;   CORONARY STENT INTERVENTION N/A 06/11/2017   Procedure: CORONARY STENT INTERVENTION;  Surgeon: Jose Crome, MD;  Location: [facility];  Service: Cardiovascular;  Laterality: N/A;   HERNIA REPAIR  5 yrs ago   right    LEFT HEART CATH AND CORONARY ANGIOGRAPHY N/A 06/11/2017   Procedure: LEFT HEART CATH AND CORONARY ANGIOGRAPHY;  Surgeon: Jose Crome, MD;  Location: [facility];  Service: Cardiovascular;  Laterality: N/A;   LEFT HEART CATH AND CORONARY ANGIOGRAPHY N/A 08/18/2017   Procedure: LEFT HEART CATH AND CORONARY ANGIOGRAPHY;  Surgeon: Martinique, Peter M, MD;  Location: [facility];  Service: Cardiovascular;  Laterality: N/A;   LEFT HEART CATH AND CORONARY ANGIOGRAPHY N/A 12/12/2020   Procedure: LEFT HEART CATH AND CORONARY ANGIOGRAPHY;  Surgeon: Jose Sine, MD;  Location: [place] [facility];  Service: Cardiovascular;  Laterality: N/A;   NM MYOCAR PERF WALL MOTION  11/30/2007   protocol:[place], post EF 67%,mild perfusion defect seen in basal inferior consistant with Jose Warner, exercise cap. 12METS   ROBOT ASSISTED LAPAROSCOPIC RADICAL PROSTATECTOMY  05/20/2012   Procedure: ROBOTIC ASSISTED LAPAROSCOPIC RADICAL  PROSTATECTOMY LEVEL 1;  Surgeon: Jose Gray, MD;  Location: [facility];  Service: Urology;  Laterality: N/A;      TRANSURETHRAL RESECTION OF PROSTATE  13yr ago    Current Medications: Current Meds  Medication Sig   amLODipine (NORVASC) 2.5 MG tablet Take 1 tablet (2.5 mg total) by mouth daily.   aspirin EC 81 MG tablet Take 1 tablet (81 mg total) by mouth daily.   b complex vitamins tablet Take 1 tablet by mouth daily with supper.   bisoprolol (ZEBETA) 5 MG tablet Take 2.5 mg by mouth daily.    budesonide-formoterol (SYMBICORT) 80-4.5 MCG/ACT inhaler SMARTSIG:By Mouth   dofetilide (Jose [place]) 500 MCG capsule Take 1 capsule (500 mcg total) by mouth 2 (two) times daily.   ezetimibe (ZETIA) 10 MG tablet Take 10 mg by mouth every evening.    fluticasone (FLONASE) 50 MCG/ACT nasal spray Place 1 spray into both nostrils as needed for allergies.   guaiFENesin (MUCINEX) 600 MG 12 hr tablet Take 600 mg by mouth daily.   Homeopathic Products (ZICAM COLD REMEDY PO) Take 1 tablet by mouth in the morning and at bedtime.   Multiple Vitamin (MULTIVITAMIN WITH MINERALS) TABS tablet Take 1 tablet by mouth daily with supper.   nitroGLYCERIN (NITROSTAT) 0.4 MG SL tablet Place 1 tablet (0.4 mg total) under the tongue every 5 (five) minutes x 3 doses as needed for chest pain.   pantoprazole (PROTONIX) 40 MG tablet Take 40 mg by mouth 2 (two) times daily.    pravastatin (PRAVACHOL) 80 MG tablet Take 80 mg by mouth every evening.    PROAIR HFA 108 (90 BASE) MCG/ACT inhaler Take 2 puffs by mouth every 4 (four) hours as needed for wheezing or shortness of breath. For shortness of breath.   rivaroxaban (XARELTO) 20 MG TABS tablet TAKE 1 TABLET DAILY WITH SUPPER   Vitamin D, Ergocalciferol, 2000 units CAPS Take 2,000 Units by mouth at bedtime.      Allergies:   Doxycycline, Gluten meal, Wheat bran, Statins, and Sulfa antibiotics   Social History   Socioeconomic History   Marital status: Married    Spouse name: Not  on file   Number of children: 3   Years of education: Not on file   Highest education level: Not on file  Occupational History    Employer: [employer]  Tobacco Use   Smoking status: Never   Smokeless tobacco: Never  Vaping Use   Vaping Use: Never used  Substance and Sexual Activity   Alcohol use: Not Currently    Alcohol/week: 2.0 standard drinks    Types: 2 Glasses of wine per week   Drug use: No   Sexual activity: Yes    Partners: Female  Other Topics Concern   Not on file  Social History Narrative   Not on file   Social Determinants of Health   Financial Resource Strain: Not on file  Food Insecurity: Not on file  Transportation Needs:  Not on file  Physical Activity: Not on file  Stress: Not on file  Social Connections: Not on file     Family History: The patient's family history includes Cancer in his paternal grandmother and sister; Heart attack in his father and paternal grandfather; Heart disease in his father and paternal grandfather; Stroke in his maternal grandfather and mother. There is no history of Colon cancer, Esophageal cancer, Pancreatic cancer, Rectal cancer, or Stomach cancer.  ROS:   Please see the history of present illness.     All other systems reviewed and are negative.  EKGs/Labs/Other Studies Reviewed:    The following studies were reviewed today:  Cath 12/12/2020   Ost LAD to Prox LAD lesion is 30% stenosed.   Mid LAD to Dist LAD lesion is 30% stenosed.   Previously placed Mid LAD stent (unknown type) is  widely patent.   LV end diastolic pressure is low.   The left ventricular ejection fraction is 55-65% by visual estimate.   Mild nonobstructive residual CAD with mild to moderate proximal to mid LAD calcification with 30% smooth proximal stenosis, widely patent LAD stent, mild 30% smooth stenosis beyond the stented segment; normal left circumflex and normal dominant RCA.   Normal LV function with EF estimated at 55 to 65% without focal  segmental wall motion abnormalities.  LVEDP 8 mmHg.   RECOMMENDATION: Suspect nonischemic chest pain.  Patient is currently in sinus rhythm.  Recommend resumption of Xarelto tomorrow.  Probably okay for discharge later today.  Continue aggressive lipid-lowering therapy with target LDL less than 70 and optimal blood pressure control.  EKG:  EKG is not ordered today.    Recent Labs: 12/11/2020: ALT 31; Magnesium 2.1 12/12/2020: BUN 13; Creatinine, Ser 1.13; Hemoglobin 13.7; Platelets 221; Potassium 4.0; Sodium 136  Recent Lipid Panel    Component Value Date/Time   CHOL 147 12/12/2020 0201   TRIG 150 (H) 12/12/2020 0201   HDL 40 (L) 12/12/2020 0201   CHOLHDL 3.7 12/12/2020 0201   VLDL 30 12/12/2020 0201   LDLCALC 77 12/12/2020 0201     Risk Assessment/Calculations:           Physical Exam:    VS:  BP (!) 110/52   Pulse (!) 58   Ht 5' 8"  (1.727 m)   Wt 182 lb 9.6 oz (82.8 kg)   SpO2 97%   BMI 27.76 kg/m     Wt Readings from Last 3 Encounters:  12/27/20 182 lb 9.6 oz (82.8 kg)  12/11/20 178 lb 5.6 oz (80.9 kg)  12/04/20 180 lb (81.6 kg)     GEN:  Well nourished, well developed in no acute distress HEENT: Normal NECK: No JVD; No carotid bruits LYMPHATICS: No lymphadenopathy CARDIAC: RRR, no murmurs, rubs, gallops RESPIRATORY:  Clear to auscultation without rales, wheezing or rhonchi  ABDOMEN: Soft, non-tender, non-distended MUSCULOSKELETAL:  No edema; No deformity  SKIN: Warm and dry NEUROLOGIC:  Alert and oriented x 3 PSYCHIATRIC:  Normal affect   ASSESSMENT:    1. Coronary artery disease involving native coronary artery of native heart without angina pectoris   2. Persistent atrial fibrillation ([place])   3. Hyperlipidemia LDL goal <70    PLAN:    In order of problems listed above:  CAD: Recent cardiac catheterization showed nonobstructive disease.  He continues to have atypical chest pain and left flank pain despite negative cath.  Symptom does not occur with  physical exertion.  No further work-up is needed.  Persistent atrial fibrillation:  On Jose Warner and Xarelto.    Hyperlipidemia: On pravastatin.     Medication Adjustments/Labs and Tests Ordered: Current medicines are reviewed at length with the patient today.  Concerns regarding medicines are outlined above.  No orders of the defined types were placed in this encounter.  No orders of the defined types were placed in this encounter.   Patient Instructions  Medication Instructions:  Your physician recommends that you continue on your current medications as directed. Please refer to the Current Medication list given to you today.  *If you need a refill on your cardiac medications before your next appointment, please call your pharmacy*  Warner Work: NONE ordered at this time of appointment   If you have labs (blood work) drawn today and your tests are completely normal, you will receive your results only by: [place] (if you have MyChart) OR A paper copy in the mail If you have any Warner test that is abnormal or we need to change your treatment, we will call you to review the results.  Testing/Procedures: NONE ordered at this time of appointment   Follow-Up: At [facility], you and your health needs are our priority.  As part of our continuing mission to provide you with exceptional heart care, we have created designated Provider Care Teams.  These Care Teams include your primary Cardiologist (physician) and Advanced Practice Providers (APPs -  Physician Assistants and Nurse Practitioners) who all work together to provide you with the care you need, when you need it.  Your next appointment:   4 month(s)  The format for your next appointment:   In Person  Provider:   Shelva Majestic, MD  Other Instructions    Signed, Almyra Deforest, [place]  12/29/2020 11:22 PM    [place]

## 2020-12-27 NOTE — Patient Instructions (Signed)
Medication Instructions:  Your physician recommends that you continue on your current medications as directed. Please refer to the Current Medication list given to you today.  *If you need a refill on your cardiac medications before your next appointment, please call your pharmacy*  Lab Work: NONE ordered at this time of appointment   If you have labs (blood work) drawn today and your tests are completely normal, you will receive your results only by: [place] (if you have MyChart) OR A paper copy in the mail If you have any lab test that is abnormal or we need to change your treatment, we will call you to review the results.  Testing/Procedures: NONE ordered at this time of appointment   Follow-Up: At [facility], you and your health needs are our priority.  As part of our continuing mission to provide you with exceptional heart care, we have created designated Provider Care Teams.  These Care Teams include your primary Cardiologist (physician) and Advanced Practice Providers (APPs -  Physician Assistants and Nurse Practitioners) who all work together to provide you with the care you need, when you need it.  Your next appointment:   4 month(s)  The format for your next appointment:   In Person  Provider:   Shelva Majestic, MD  Other Instructions

## 2020-12-29 ENCOUNTER — Encounter: Payer: Self-pay | Admitting: Physician Assistant

## 2021-03-29 ENCOUNTER — Other Ambulatory Visit (HOSPITAL_COMMUNITY): Payer: Self-pay | Admitting: Physician Assistant

## 2021-05-16 ENCOUNTER — Other Ambulatory Visit: Payer: Self-pay

## 2021-05-16 ENCOUNTER — Encounter: Payer: Self-pay | Admitting: Cardiovascular Disease

## 2021-05-16 ENCOUNTER — Ambulatory Visit (INDEPENDENT_AMBULATORY_CARE_PROVIDER_SITE_OTHER): Payer: Medicare Other | Admitting: Cardiovascular Disease

## 2021-05-16 DIAGNOSIS — Z8546 Personal history of malignant neoplasm of prostate: Secondary | ICD-10-CM

## 2021-05-16 DIAGNOSIS — E782 Mixed hyperlipidemia: Secondary | ICD-10-CM

## 2021-05-16 DIAGNOSIS — Z7901 Long term (current) use of anticoagulants: Secondary | ICD-10-CM | POA: Diagnosis not present

## 2021-05-16 DIAGNOSIS — I2510 Atherosclerotic heart disease of native coronary artery without angina pectoris: Secondary | ICD-10-CM | POA: Diagnosis not present

## 2021-05-16 DIAGNOSIS — Z9861 Coronary angioplasty status: Secondary | ICD-10-CM

## 2021-05-16 DIAGNOSIS — Z5181 Encounter for therapeutic drug level monitoring: Secondary | ICD-10-CM

## 2021-05-16 DIAGNOSIS — I480 Paroxysmal atrial fibrillation: Secondary | ICD-10-CM | POA: Diagnosis not present

## 2021-05-16 DIAGNOSIS — I319 Disease of pericardium, unspecified: Secondary | ICD-10-CM

## 2021-05-16 DIAGNOSIS — I4819 Other persistent atrial fibrillation: Secondary | ICD-10-CM

## 2021-05-16 NOTE — Patient Instructions (Signed)
Medication Instructions:  NO CHANGES  *If you need a refill on your cardiac medications before your next appointment, please call your pharmacy*  Follow-Up: At [facility], you and your health needs are our priority.  As part of our continuing mission to provide you with exceptional heart care, we have created designated Provider Care Teams.  These Care Teams include your primary Cardiologist (physician) and Advanced Practice Providers (APPs -  Physician Assistants and Nurse Practitioners) who all work together to provide you with the care you need, when you need it.  We recommend signing up for the patient portal called "MyChart".  Sign up information is provided on this After Visit Summary.  MyChart is used to connect with patients for Virtual Visits (Telemedicine).  Patients are able to view lab/test results, encounter notes, upcoming appointments, etc.  Non-urgent messages can be sent to your provider as well.   To learn more about what you can do with MyChart, go to [email or web address].    Your next appointment:   12 months with Dr. Claiborne Billings

## 2021-05-16 NOTE — Progress Notes (Signed)
Patient ID: Jose Warner, male   DOB: [birth year], 71 y.o.   MRN: [id number]     PCP: Dr. Derinda Late   HPI: Jose Warner is a 71 y.o. male who presents for a 42-monthfollow-up cardiology evaluation.   Jose Warner a strong family history for premature coronary artery disease. In May 2006 cardiac catheterization showed very mild LAD and circumflex narrowing felt to be not flow limiting at approximately 20-30%. He has a history of low HDL levels. He developed focal prostate cancer and underwent robotic prostatectomy by Dr. GAraceli Bouchein 2014 When I saw him after his prostate surgery in March 2014 he was in atrial fibrillation at which time he was completely unaware of this rhythm disturbance. He was started on eliquis 5 mg twice a day anticoagulation and as well as metoprolol succinate. He subsequently converted spontaneously to sinus rhythm and has been maintaining sinus rhythm since. He remains active. He exercises daily. He denies any chest pain. He denies shortness of breath. He denies palpitations. His echo Doppler study showed an ejection fraction in the 55-65% range. He had  upper normal LA size. There is mild aortic insufficiency. Pulmonary pressures were normal with an estimated RV systolic pressure 15 mm.  In the past, he had been on Niaspan for low HDL levels. This was stopped when he was also put on cialis following his prostate surgery. He no longer takes cialis.  Laboratory done in April 2014 showed cholesterol 131 triglycerides 96 his HDL remains very low at 23 and his LDL was 89. Thyroid function studies were normal as were his hemoglobin hematocrit and chemistries.  He is [place] from [place] was doing p[employer]wifework with travel.  He denies any episodes of chest pain.  Dr. BSandi Mariscalhas put him back on his simvastatin 40 mg and niacin 1000 mg based on laboratory that he had done.  He continues to take Bystolic 5 mg for hypertension.  He continues to have  difficulty with erectile function following his prostate surgery.  He also was diagnosed with celiac disease and is extremely sensitive to gluten.   He remains active.  He is not aware of any recurrent arrhythmia.  Dr. BSandi Mariscalchecks laboratory and he was told that his labs most recently were excellent.  He is tolerating Bystolic 5 mg.  He is followed by Dr. BAlinda Moneyfor his prostate.  He is on a gluten-free diet.   He was hospitalized on 06/09/2017 after having experienced intermittent chest pain  He was felt to potentially have unstableand stenting of a 95% mid LAD stenosiswithin a diffusely diseasedlcified segmen  There also was mild 40% narrowing proximal and distal to the stent.  He was discharged on 06/12/2017.  He felt vague recurrent chest pain leading to an additional overnight hospital stayon from 324.  Troponins were negative.  ECG was without ischemic changes and he was   Since he was bradycardic with heart rates in the 40s.    When I saw him for follow-up evaluation in March 2019 well and was gradually gaining his strength.  Because of his PAF, he was on Plavix and Xarelto.  He also was on bisoprolol 2.5 mg daily.  They continue to be on niacin and simvastatin per Dr. BSandi Mariscaland is on Symbicort with a history of asthma.   He developed a different type of chest pain that was lasted tense but had occurred consecutively on 3 days in a row.  He had taken nitroglycerin with  some improvement.  He was rehospitalized on August 17, 2017 and underwent repeat cardiac catheterization August 18, 2017 by Dr. Martinique.  His LAD stent was patent but he had 30% proximal, 40% mid, and 20% distal LAD stenoses.  He has felt improved.  He was treated for possible bronchitis and was given Z-Pak by Dr. Sandi Mariscal.    He was hospitalized from September 9 through December 30, 2017 after developing left upper chest, neck pain associated with left arm numbness not responsive to nitroglycerin.  His ECG did not reveal  ischemic changes.  D-dimer was detectable.  He was diagnosed as having acute pericarditis which was confirmed on MRI imaging and he was treated with ibuprofen in addition to colchicine.  CT also demonstrated cervical spondylosis without acute findings.  His symptoms have gradually improved therapy.   I saw him on January 19, 2018.  At that time he was feeling well but still experienced occasional sharp discomfort but denied any classic pleuritic-like symptoms.  He completed a 8-monthcourse of colchicine for his pericarditis.  He represented to the emergency room on November 1 with recurrent AF and was seen in follow-up in the A. fib clinic with DRoderic Palau  He was not cardioverted in the ER due to his ongoing treatment for pericarditis and he continued to be on bisoprolol 5 mg daily in addition to Xarelto.  He saw Dr. ARayann Hemanin follow-up March 08, 2018 and during that evaluation it was noted that he had converted on his own back to sinus rhythm.   When I last saw him in March 2020 he denied any recurrent awareness of atrial fibrillation.  Dr. PFilbert Bertholdis is primary care physician who checks his laboratory.  When I saw him he was without recurrent pleuritic chest pain.    He developed recurrent atrial fibrillation in July and underwent successful DC cardioversion on November 11, 2018.  Unfortunately, he developed recurrent atrial fibrillation and was therefore admitted to the hospital for Tikosyn loading on September 15 through January 07, 2019.  He converted to sinus rhythm on the medication and did not require repeat DC cardioversion.  I saw him in follow-up in October 2020.  At that time he was unaware of recurrent episodes of atrial fibrillation since Tikosyn initiation.  He continued to be on anticoagulation therapy.  He denied any recurrent pleuritic symptoms.  He continued to be on pravastatin 80 mg for hyperlipidemia in addition to niacin per Dr. BSandi Mariscalapparently due to particle size.   He also has been on bisoprolol 5 mg in the morning and 2.5 mg at night as well as amlodipine 2.5 mg daily.  He denies any recurrent anginal symptoms.    I last saw him on March 28, 2020.  He has been followed in the atrial fibrillation clinic and also sees Dr. BSandi Mariscal  He believes he may have had atrial fibrillation for one day when he was on prednisone therapy and antibiotics for sinus infection.  He was last evaluated in A. fib clinic in March 19, 2020.  Presently, he feels well.  He denies any chest pain or shortness of breath.  He continues to be on amlodipine 2.5 mg daily, bisoprolol 2.5 mg, for hypertension.  He is on Tikosyn 500 mg twice a day for his atrial fibrillation.  He continues to be on Symbicort and as needed albuterol for asthma.  He is on Xarelto 20 mg daily for anticoagulation.  He continues to be on Zetia 10 mg, pravastatin  80 mg, and slow release niacin 1000 mg daily for mixed hyperlipidemia.   Since I last saw him, he has continued to be seen in the atrial fibrillation clinic was evaluated by Jose Warner on September 24, 2020.  He developed recurrent chest pain in August and presented to [facility] in December 11, 2020 for central chest pain and worsening shortness of breath.  He underwent repeat cardiac catheterization which demonstrated patency of his LAD stent and mild plaque without significant stenoses in other vessels.  A CT angiogram of his chest did not show acute abnormality.  His Xarelto has been held for the procedure and was resumed following evening.  Possible GI evaluation was recommended.  He was seen by Almyra Deforest, PA in follow-up of his hospitalization and remained stable.  Presently, he feels well.  He denies any chest pain or shortness of breath.  He has not had any recurrent pericarditis and is no longer on colchicine.  He continues to be on amlodipine 2.5 mg and bisoprolol 2.5 mg daily.  He is on pravastatin 80 mg in addition to Zetia 10 mg.  He continues to take  Xarelto 20 mg.  He is now on Protonix 40 mg twice a day.  He has not had any recurrent atrial fibrillation on dofetilide 500 mg twice a day.  He presents for reevaluation.  Past Medical History:  Diagnosis Date   Anemia    Bronchitis 05-10-12   past fall- 4 runs antibiotics due to bronchitis-uses Pro Air as needed   Celiac disease    Colon polyps    adenomatous   Coronary artery disease    Diverticulosis    GERD (gastroesophageal reflux disease)    Hepatitis 05-10-12   "was told non A, non B"-unclean dental equip.   Hyperlipidemia    Iron deficiency anemia    Pericarditis    Persistent atrial fibrillation ([place]) 06/05/2017   Prostate cancer ([place]) 05-10-12   ;bx. 04-12-12, dx   Spasm of esophagus 2013    Past Surgical History:  Procedure Laterality Date   CARDIAC CATHETERIZATION  09/13/2004   LAD:30%-40% in prox. to mid segment with 20% in the mid segment and 20% in left Circ., mild 20% right common iliac narrowing. medical therapy   CARDIOVERSION  06/05/2017   CARDIOVERSION N/A 11/11/2018   Procedure: CARDIOVERSION;  Surgeon: Pixie Casino, MD;  Location: [place];  Service: Cardiovascular;  Laterality: N/A;   CORONARY STENT INTERVENTION N/A 06/11/2017   Procedure: CORONARY STENT INTERVENTION;  Surgeon: Belva Crome, MD;  Location: [place] [facility];  Service: Cardiovascular;  Laterality: N/A;   HERNIA REPAIR  5 yrs ago   right    LEFT HEART CATH AND CORONARY ANGIOGRAPHY N/A 06/11/2017   Procedure: LEFT HEART CATH AND CORONARY ANGIOGRAPHY;  Surgeon: Belva Crome, MD;  Location: [place] [facility];  Service: Cardiovascular;  Laterality: N/A;   LEFT HEART CATH AND CORONARY ANGIOGRAPHY N/A 08/18/2017   Procedure: LEFT HEART CATH AND CORONARY ANGIOGRAPHY;  Surgeon: Martinique, Peter M, MD;  Location: [place] [facility];  Service: Cardiovascular;  Laterality: N/A;   LEFT HEART CATH AND CORONARY ANGIOGRAPHY N/A 12/12/2020   Procedure: LEFT HEART CATH AND CORONARY ANGIOGRAPHY;   Surgeon: Troy Sine, MD;  Location: [place] [facility];  Service: Cardiovascular;  Laterality: N/A;   NM MYOCAR PERF WALL MOTION  11/30/2007   protocol:Bruce, post EF 67%,mild perfusion defect seen in basal inferior consistant with Attenuation artifact, exercise cap. 12METS  ROBOT ASSISTED LAPAROSCOPIC RADICAL PROSTATECTOMY  05/20/2012   Procedure: ROBOTIC ASSISTED LAPAROSCOPIC RADICAL PROSTATECTOMY LEVEL 1;  Surgeon: Dutch Gray, MD;  Location: [facility];  Service: Urology;  Laterality: N/A;      TRANSURETHRAL RESECTION OF PROSTATE  78yr ago    Allergies  Allergen Reactions   Doxycycline Hives   Gluten Meal Other (See Comments)    Celiac, stomach crams , gas   Wheat Bran Other (See Comments)    Celiac    Statins     Leg muscle pains - rosuvastatin, atorvastatin, zetia. Tolerates simvastatin   Sulfa Antibiotics Rash    Current Outpatient Medications  Medication Sig Dispense Refill   amLODipine (NORVASC) 2.5 MG tablet Take 1 tablet (2.5 mg total) by mouth daily. 90 tablet 3   aspirin EC 81 MG tablet Take 1 tablet (81 mg total) by mouth daily. 90 tablet 3   b complex vitamins tablet Take 1 tablet by mouth daily with supper.     bisoprolol (ZEBETA) 5 MG tablet Take 2.5 mg by mouth daily.      budesonide-formoterol (SYMBICORT) 80-4.5 MCG/ACT inhaler SMARTSIG:By Mouth     dofetilide (TIKOSYN) 500 MCG capsule Take 1 capsule (500 mcg total) by mouth 2 (two) times daily. 180 capsule 2   ezetimibe (ZETIA) 10 MG tablet Take 10 mg by mouth every evening.      fluticasone (FLONASE) 50 MCG/ACT nasal spray Place 1 spray into both nostrils as needed for allergies.     guaiFENesin (MUCINEX) 600 MG 12 hr tablet Take 600 mg by mouth daily.     Homeopathic Products (ZICAM COLD REMEDY PO) Take 1 tablet by mouth in the morning and at bedtime.     Multiple Vitamin (MULTIVITAMIN WITH MINERALS) TABS tablet Take 1 tablet by mouth daily with supper.     nitroGLYCERIN (NITROSTAT) 0.4 MG SL tablet Place 1  tablet (0.4 mg total) under the tongue every 5 (five) minutes x 3 doses as needed for chest pain. 25 tablet 11   pantoprazole (PROTONIX) 40 MG tablet Take 40 mg by mouth 2 (two) times daily.      pravastatin (PRAVACHOL) 80 MG tablet Take 80 mg by mouth every evening.      PROAIR HFA 108 (90 BASE) MCG/ACT inhaler Take 2 puffs by mouth every 4 (four) hours as needed for wheezing or shortness of breath. For shortness of breath.     Vitamin D, Ergocalciferol, 2000 units CAPS Take 2,000 Units by mouth at bedtime.      XARELTO 20 MG TABS tablet TAKE 1 TABLET DAILY WITH SUPPER 90 tablet 3   No current facility-administered medications for this visit.    Social History   Socioeconomic History   Marital status: Married    Spouse name: Not on file   Number of children: 3   Years of education: Not on file   Highest education level: Not on file  Occupational History    Employer: [employer]  Tobacco Use   Smoking status: Never   Smokeless tobacco: Never  Vaping Use   Vaping Use: Never used  Substance and Sexual Activity   Alcohol use: Not Currently    Alcohol/week: 2.0 standard drinks    Types: 2 Glasses of wine per week   Drug use: No   Sexual activity: Yes    Partners: Female  Other Topics Concern   Not on file  Social History Narrative   Not on file   Social Determinants of HRadio broadcast assistant  Strain: Not on file  Food Insecurity: Not on file  Transportation Needs: Not on file  Physical Activity: Not on file  Stress: Not on file  Social Connections: Not on file  Intimate Partner Violence: Not on file    Socially, he is a PhD and Is now retired from [employer].  He had been in the turf business and floral arrangements.  ROS General: Negative; No fevers, chills, or night sweats;  HEENT: Negative; No changes in vision or hearing, sinus congestion, difficulty swallowing Pulmonary: Negative; No cough, wheezing, shortness of breath, hemoptysis Cardiovascular: see HPI GI:  Positive for celiac disease GU: Positive for erectile dysfunction since his robotic prostatic surgery No dysuria, hematuria, or difficulty voiding Musculoskeletal: Negative; no myalgias, joint pain, or weakness Hematologic/Oncology: Negative; no easy bruising, bleeding Endocrine: Negative; no heat/cold intolerance; no diabetes Neuro: Negative; no changes in balance, headaches Skin: Negative; No rashes or skin lesions Psychiatric: Negative; No behavioral problems, depression Sleep: Negative; No snoring, daytime sleepiness, hypersomnolence, bruxism, restless legs, hypnogognic hallucinations, no cataplexy Other comprehensive 14 point system review is negative.   Physical examination BP 128/62    Pulse (!) 53    Ht 5' 8"  (1.727 m)    Wt 179 lb (81.2 kg)    SpO2 97%    BMI 27.22 kg/m     Repeat blood pressure by me was 132/66  Wt Readings from Last 3 Encounters:  05/16/21 179 lb (81.2 kg)  12/27/20 182 lb 9.6 oz (82.8 kg)  12/11/20 178 lb 5.6 oz (80.9 kg)   General: Alert, oriented, no distress.  Skin: normal turgor, no rashes, warm and dry HEENT: Normocephalic, atraumatic. Pupils equal round and reactive to light; sclera anicteric; extraocular muscles intact;  Nose without nasal septal hypertrophy Mouth/Parynx benign; Mallinpatti scale 3 Neck: No JVD, no carotid bruits; normal carotid upstroke Lungs: clear to ausculatation and percussion; no wheezing or rales Chest wall: without tenderness to palpitation Heart: PMI not displaced, RRR, s1 s2 normal, 1/6 systolic murmur, no diastolic murmur, no rubs, gallops, thrills, or heaves Abdomen: soft, nontender; no hepatosplenomehaly, BS+; abdominal aorta nontender and not dilated by palpation. Back: no CVA tenderness Pulses 2+ Musculoskeletal: full range of motion, normal strength, no joint deformities Extremities: no clubbing cyanosis or edema, Homan's sign negative  Neurologic: grossly nonfocal; Cranial nerves grossly wnl Psychologic:  Normal mood and affect  May 16, 2021 ECG (independently read by me): SInus bradycardia at 53, QTc 456 msec  March 28, 2020 ECG (independently read by me): NSR at 61; no ectopy; QTc 493  October 2020 ECG (independently read by me): NSR at 61; no ectopy: QTc 473 msec  March 2019 ECG (independently read by me): Sinus bradycardia 54 bpm.  No ectopy.  Normal intervals.  October 2019 ECG (independently read by me): Sinus bradycardia at 46 bpm.  No ectopy.  Normal intervals.  No ST segment abnormalities  May 2019 ECG (independently read by me): Normal sinus rhythm at 66 bpm.normal sinus rhythm at 66 bpm.  No ectopy.  Normal intervals.  No ST segment depression. No ectopy.  Normal intervals.  No ST segment depression.  July 02, 2017 ECG (independently read by me): sinus bradycardia 51 bpm.  No ST segment changes.  Normal intervals.  November 2018ECG (independently read by me): Sinus bradycardia 56 bpm.  No ectopy.  Normal intervals.  October 2017 ECG (independently read by me): Sinus bradycardia 59 bpm.  Normal intervals.  No ectopy.  May 2015 ECG (and apparently read by me): Normal  sinus rhythm at 60 beats per minute.  Early transition.  No ectopy.  PR interval 174 ms, QTc interval 460 ms.  ECG: (independently read by me): Sinus rhythm at 56 beats per minute. No ectopy. Normal intervals.  LABS:  BMP Latest Ref Rng & Units 12/12/2020 12/11/2020 12/11/2020  Glucose 70 - 99 mg/dL 86 96 96  BUN 8 - 23 mg/dL 13 14 13   Creatinine 0.61 - 1.24 mg/dL 1.13 1.00 1.13  Sodium 135 - 145 mmol/L 136 140 136  Potassium 3.5 - 5.1 mmol/L 4.0 4.2 4.2  Chloride 98 - 111 mmol/L 107 108 107  CO2 22 - 32 mmol/L 23 - 19(L)  Calcium 8.9 - 10.3 mg/dL 8.5(L) - 9.3    Hepatic Function Panel     Component Value Date/Time   PROT 7.0 12/11/2020 1200     CBC Latest Ref Rng & Units 12/12/2020 12/11/2020 12/11/2020  WBC 4.0 - 10.5 K/uL 5.6 - 6.5  Hemoglobin 13.0 - 17.0 g/dL 13.7 15.0 14.8  Hematocrit 39.0 -  52.0 % 42.5 44.0 46.2  Platelets 150 - 400 K/uL 221 - 247    BNP No results found for: PROBNP  Lipid Panel     Component Value Date/Time   CHOL 147 12/12/2020 0201     RADIOLOGY: No results found.  IMPRESSION:  1. Coronary artery disease involving native coronary artery of native heart without angina pectoris   2. CAD S/P percutaneous coronary angioplasty   3. Paroxysmal atrial fibrillation (HCC)   4. Anticoagulation adequate   5. Mixed hyperlipidemia   6. Therapeutic drug monitoring   7. History of prostate cancer   8. Pericarditis, resolved     ASSESSMENT AND PLAN: Mr. Candelas Is a 71 year-old gentleman who had undergone cardiac catheterization in May 2006 and was found to have mild nonobstructive CAD.  He has a history of hyperlipidemia with very low HDL levels and had been on niacin in addition to his statin which previously was simvastatin 40 mg and now he is on pravastatin 80 mg daily followed by Dr. Sandi Mariscal.   He developed atrial fibrillation for which he was unaware following his robotic prostatectomy for his prostate cancer.  He was hospitalized in February 2019 with intermittent chest pain.  Due to worrisome symptoms of unstable angina, catheterization was performed and revealed a 95% mid LAD stenosis in a calcified segment and mild additional concomitant CAD.  Initially he received triple therapy but ultimately aspirin was discontinued and he was maintained on Plavix and anticoagulation.Marland Kitchen  He developed repeat chest pain leading to repeat catheterization on August 18, 2017.  This chest pain was less intense character but was somewhat nitrate responsive.  He also took a Z-Pak for potential treatment of bronchitis.  He was diagnosed with acute pericarditis confirmed by MRI imaging September 2019. He was on Plavix in addition to Xarelto for his anticoagulation with his recent use of nonsteroidal anti-inflammatory medication and treatment with colchicine.  When I saw him in  follow-up since it had been over 6 months since his stent placement I recommended he discontinue Plavix but resume a baby aspirin 81 mg and continue Xarelto.  He was on colchicine for at least 3 months duration.  Over time ultimately his pericarditis symptomatology ultimately resolved.  Unfortunately he continued experiences with atrial fibrillation and has required repeat cardioversion and due to recurrence was r hospitalized and underwent successful Tikosyn loading at 500 mg twice a day with pharmacologic conversion back to sinus rhythm.  Since  Tikosyn initiation, he admits to only 1 brief episode of atrial fibrillation which occurred in the setting while he was on prednisone in addition to antibiotics for sinus infection.  Since my last evaluation, he had developed recurrent chest pain symptomatology and underwent repeat catheterization in August 2022 which revealed a patent stent and mild concomitant CAD.  Presently he feels well and is asymptomatic.  His blood pressure today is stable on low-dose amlodipine and bisoprolol.  He is maintaining sinus rhythm.  QTc interval is not prolonged on dofetilide.  He continues to be on pravastatin 80 mg and Zetia for hyperlipidemia.  LDL cholesterol in August 2022 was 77.  He continues to see Dr. Ihor Austin.  His pericarditis is finally resolved.  He is tolerating anticoagulation.  As long as he remains stable I will see him in 1 year for reevaluation or sooner as needed.   Troy Sine, MD, [facility]  05/23/2021  3:51 PM

## 2021-05-23 ENCOUNTER — Encounter: Payer: Self-pay | Admitting: Cardiovascular Disease

## 2021-06-07 ENCOUNTER — Other Ambulatory Visit (HOSPITAL_COMMUNITY): Payer: Self-pay | Admitting: Physician Assistant

## 2021-08-01 ENCOUNTER — Other Ambulatory Visit (HOSPITAL_COMMUNITY): Payer: Self-pay | Admitting: *Deleted

## 2021-08-01 MED ORDER — DOFETILIDE 500 MCG PO CAPS: 500.0000 ug | ORAL_CAPSULE | Freq: Two times a day (BID) | ORAL | 2 refills | Status: DC

## 2021-10-02 ENCOUNTER — Encounter (HOSPITAL_COMMUNITY): Payer: Self-pay | Admitting: Physician Assistant

## 2021-10-02 ENCOUNTER — Ambulatory Visit (HOSPITAL_COMMUNITY)
Admission: RE | Admit: 2021-10-02 | Discharge: 2021-10-02 | Disposition: A | Discharge status: Home / Self Care | Payer: Medicare Other | Source: Ambulatory Visit | Attending: Physician Assistant | Admitting: Physician Assistant

## 2021-10-02 VITALS — BP 118/60 | HR 55 | Ht 68.0 in | Wt 175.8 lb

## 2021-10-02 DIAGNOSIS — Z79899 Other long term (current) drug therapy: Secondary | ICD-10-CM | POA: Diagnosis not present

## 2021-10-02 DIAGNOSIS — E785 Hyperlipidemia, unspecified: Secondary | ICD-10-CM | POA: Insufficient documentation

## 2021-10-02 DIAGNOSIS — D6869 Other thrombophilia: Secondary | ICD-10-CM | POA: Diagnosis not present

## 2021-10-02 DIAGNOSIS — Z7901 Long term (current) use of anticoagulants: Secondary | ICD-10-CM | POA: Diagnosis not present

## 2021-10-02 DIAGNOSIS — I2510 Atherosclerotic heart disease of native coronary artery without angina pectoris: Secondary | ICD-10-CM | POA: Insufficient documentation

## 2021-10-02 DIAGNOSIS — I4819 Other persistent atrial fibrillation: Secondary | ICD-10-CM

## 2021-10-02 DIAGNOSIS — Z8546 Personal history of malignant neoplasm of prostate: Secondary | ICD-10-CM | POA: Diagnosis not present

## 2021-10-02 LAB — BASIC METABOLIC PANEL
Anion gap: 6 (ref 5–15)
BUN: 14 mg/dL (ref 8–23)
CO2: 23 mmol/L (ref 22–32)
Calcium: 8.7 mg/dL — ABNORMAL LOW (ref 8.9–10.3)
Chloride: 108 mmol/L (ref 98–111)
Creatinine, Ser: 1.14 mg/dL (ref 0.61–1.24)
GFR, Estimated: >60 mL/min (ref >60)
Glucose, Bld: 111 mg/dL — ABNORMAL HIGH (ref 70–99)
Potassium: 4.6 mmol/L (ref 3.5–5.1)
Sodium: 137 mmol/L (ref 135–145)

## 2021-10-02 LAB — MAGNESIUM: Magnesium: 2 mg/dL (ref 1.7–2.4)

## 2021-10-02 NOTE — Progress Notes (Signed)
Primary Care Physician: Derinda Late, MD Referring Physician: [facility] F/u Primary Cardiologist: [name] Primary EP: Dr Becky Sax Jose Warner is a 71 y.o. male with a h/o persistent afib, HLD, CAD, and prostate cancer who presents to the AF Clinic for follow up. Patient loaded on dofetilide 9/15-9/18/20. He converted to SR on the medication and did not require DCCV. He is on Xarelto for a CHADS2VASC score of 2. Patient was seen in the ER on 08/02/19 for acute CP. He was diagnosed with esophageal candidiasis (he had recently been on steroids and abx for a sinus infection). His CP resolved with GI cocktail.   Patient seen at ED several times 11/2020 for CP and had LHC on 12/12/20 which showed nonobstructive CAD.   On follow up today, patient reports that he has done well from a cardiac standpoint. He denies any heart racing or palpitations. His smart watch has shown only sinus rhythm. No bleeding issues on anticoagulation.   Today, he denies symptoms of palpitations, SOB, orthopnea, PND, lower extremity edema, presyncope, syncope, or neurologic sequela. The patient is tolerating medications without difficulties and is otherwise without complaint today.   Past Medical History:  Diagnosis Date   Anemia    Bronchitis 05-10-12   past fall- 4 runs antibiotics due to bronchitis-uses Pro Air as needed   Celiac disease    Colon polyps    adenomatous   Coronary artery disease    Diverticulosis    GERD (gastroesophageal reflux disease)    Hepatitis 05-10-12   "was told non A, non B"-unclean dental equip.   Hyperlipidemia    Iron deficiency anemia    Pericarditis    Persistent atrial fibrillation ([place]) 06/05/2017   Prostate cancer ([place]) 05-10-12   ;bx. 04-12-12, dx   Spasm of esophagus 2013   Past Surgical History:  Procedure Laterality Date   CARDIAC CATHETERIZATION  09/13/2004   LAD:30%-40% in prox. to mid segment with 20% in the mid segment and 20% in left Circ., mild  20% right common iliac narrowing. medical therapy   CARDIOVERSION  06/05/2017   CARDIOVERSION N/A 11/11/2018   Procedure: CARDIOVERSION;  Surgeon: Pixie Casino, MD;  Location: [place];  Service: Cardiovascular;  Laterality: N/A;   CORONARY STENT INTERVENTION N/A 06/11/2017   Procedure: CORONARY STENT INTERVENTION;  Surgeon: Belva Crome, MD;  Location: [place] [facility];  Service: Cardiovascular;  Laterality: N/A;   HERNIA REPAIR  5 yrs ago   right    LEFT HEART CATH AND CORONARY ANGIOGRAPHY N/A 06/11/2017   Procedure: LEFT HEART CATH AND CORONARY ANGIOGRAPHY;  Surgeon: Belva Crome, MD;  Location: [place] [facility];  Service: Cardiovascular;  Laterality: N/A;   LEFT HEART CATH AND CORONARY ANGIOGRAPHY N/A 08/18/2017   Procedure: LEFT HEART CATH AND CORONARY ANGIOGRAPHY;  Surgeon: Martinique, Peter M, MD;  Location: [place] [facility];  Service: Cardiovascular;  Laterality: N/A;   LEFT HEART CATH AND CORONARY ANGIOGRAPHY N/A 12/12/2020   Procedure: LEFT HEART CATH AND CORONARY ANGIOGRAPHY;  Surgeon: Troy Sine, MD;  Location: [place] [facility];  Service: Cardiovascular;  Laterality: N/A;   NM MYOCAR PERF WALL MOTION  11/30/2007   protocol:[place]ruce, post EF 67%,mild perfusion defect seen in basal inferior consistant with Attenuation artifact, exercise cap. 12METS   ROBOT ASSISTED LAPAROSCOPIC RADICAL PROSTATECTOMY  05/20/2012   Procedure: ROBOTIC ASSISTED LAPAROSCOPIC RADICAL PROSTATECTOMY LEVEL 1;  Surgeon: Dutch Gray, MD;  Location: [facility];  Service: Urology;  Laterality: N/A;      TRANSURETHRAL RESECTION OF PROSTATE  30yr ago    Current Outpatient Medications  Medication Sig Dispense Refill   amLODipine (NORVASC) 2.5 MG tablet Take 1 tablet (2.5 mg total) by mouth daily. 90 tablet 3   aspirin EC 81 MG tablet Take 1 tablet (81 mg total) by mouth daily. 90 tablet 3   b complex vitamins tablet Take 1 tablet by mouth daily with supper.     bisoprolol (ZEBETA) 5 MG tablet Take 2.5 mg  by mouth daily.      budesonide-formoterol (SYMBICORT) 80-4.5 MCG/ACT inhaler SMARTSIG:By Mouth     dofetilide (TIKOSYN) 500 MCG capsule Take 1 capsule (500 mcg total) by mouth 2 (two) times daily. 180 capsule 2   ezetimibe (ZETIA) 10 MG tablet Take 10 mg by mouth every evening.      fluticasone (FLONASE) 50 MCG/ACT nasal spray Place 1 spray into both nostrils as needed for allergies.     guaiFENesin (MUCINEX) 600 MG 12 hr tablet Take 600 mg by mouth daily.     Homeopathic Products (ZICAM COLD REMEDY PO) Take 1 tablet by mouth in the morning and at bedtime.     Multiple Vitamin (MULTIVITAMIN WITH MINERALS) TABS tablet Take 1 tablet by mouth daily with supper.     nitroGLYCERIN (NITROSTAT) 0.4 MG SL tablet Place 1 tablet (0.4 mg total) under the tongue every 5 (five) minutes x 3 doses as needed for chest pain. 25 tablet 11   pantoprazole (PROTONIX) 40 MG tablet Take 40 mg by mouth 2 (two) times daily.      pravastatin (PRAVACHOL) 80 MG tablet Take 80 mg by mouth every evening.      PROAIR HFA 108 (90 BASE) MCG/ACT inhaler Take 2 puffs by mouth every 4 (four) hours as needed for wheezing or shortness of breath. For shortness of breath.     Vitamin D, Ergocalciferol, 2000 units CAPS Take 2,000 Units by mouth at bedtime.      XARELTO 20 MG TABS tablet TAKE 1 TABLET DAILY WITH SUPPER 90 tablet 3   No current facility-administered medications for this encounter.    Allergies  Allergen Reactions   Doxycycline Hives   Gluten Meal Other (See Comments)    Celiac, stomach crams , gas   Wheat Bran Other (See Comments)    Celiac    Statins     Leg muscle pains - rosuvastatin, atorvastatin, zetia. Tolerates simvastatin   Sulfa Antibiotics Rash    Social History   Socioeconomic History   Marital status: Married    Spouse name: Not on file   Number of children: 3   Years of education: Not on file   Highest education level: Not on file  Occupational History    Employer: [employer]  Tobacco Use    Smoking status: Never   Smokeless tobacco: Never   Tobacco comments:    Never smoke 10/02/21  Vaping Use   Vaping Use: Never used  Substance and Sexual Activity   Alcohol use: Not Currently    Alcohol/week: 2.0 standard drinks of alcohol    Types: 2 Glasses of wine per week   Drug use: No   Sexual activity: Yes    Partners: Female  Other Topics Concern   Not on file  Social History Narrative   Not on file   Social Determinants of Health   Financial Resource Strain: Not on file  Food Insecurity: Unknown (06/10/2017)   Hunger Vital Sign  Worried About Charity fundraiser in the Last Year: Patient refused    Arboriculturist in the Last Year: Patient refused  Transportation Needs: Unknown (06/10/2017)   PRAPARE - Hydrologist (Medical): Patient refused    Lack of Transportation (Non-Medical): Patient refused  Physical Activity: Not on file  Stress: Not on file  Social Connections: Unknown (06/10/2017)   Social Connection and Isolation Panel [NHANES]    Frequency of Communication with Friends and Family: Patient refused    Frequency of Social Gatherings with Friends and Family: Patient refused    Attends Religious Services: Patient refused    Active Member of Clubs or Organizations: Patient refused    Attends Archivist Meetings: Patient refused    Marital Status: Patient refused  Intimate Partner Violence: Unknown (06/10/2017)   Humiliation, Afraid, Rape, and Kick questionnaire    Fear of Current or Ex-Partner: Patient refused    Emotionally Abused: Patient refused    Physically Abused: Patient refused    Sexually Abused: Patient refused    Family History  Problem Relation Age of Onset   Stroke Mother    Heart disease Father    Heart attack Father    Cancer Sister    Stroke Maternal Grandfather    Cancer Paternal Grandmother    Heart attack Paternal Grandfather    Heart disease Paternal Grandfather    Colon cancer Neg Hx     Esophageal cancer Neg Hx    Pancreatic cancer Neg Hx    Rectal cancer Neg Hx    Stomach cancer Neg Hx     ROS- All systems are reviewed and negative except as per the HPI above  Physical Exam: Vitals:   10/02/21 1315  BP: 118/60  Pulse: (!) 55  Weight: 79.7 kg  Height: 5' 8"  (1.727 m)    Wt Readings from Last 3 Encounters:  10/02/21 79.7 kg  05/16/21 81.2 kg  12/27/20 82.8 kg    Labs: Lab Results  Component Value Date   NA 136 12/12/2020   K 4.0 12/12/2020   CL 107 12/12/2020   CO2 23 12/12/2020   GLUCOSE 86 12/12/2020   BUN 13 12/12/2020   CREATININE 1.13 12/12/2020   CALCIUM 8.5 (L) 12/12/2020   PHOS 3.7 12/28/2017   MG 2.1 12/11/2020   Lab Results  Component Value Date   INR 1.18 06/11/2017   Lab Results  Component Value Date   CHOL 147 12/12/2020   HDL 40 (L) 12/12/2020   LDLCALC 77 12/12/2020   TRIG 150 (H) 12/12/2020    GEN- The patient is a well appearing male, alert and oriented x 3 today.   HEENT-head normocephalic, atraumatic, sclera clear, conjunctiva pink, hearing intact, trachea midline. Lungs- Clear to ausculation bilaterally, normal work of breathing Heart- Regular rate and rhythm, no murmurs, rubs or gallops  GI- soft, NT, ND, + BS Extremities- no clubbing, cyanosis, or edema MS- no significant deformity or atrophy Skin- no rash or lesion Psych- euthymic mood, full affect Neuro- strength and sensation are intact    EKG today demonstrates SB Vent. rate 55 BPM PR interval 164 ms QRS duration 86 ms QT/QTcB 482/461 ms   Epic records reviewed   Echo 01/07/19 demonstrated  1. Left ventricular ejection fraction, by visual estimation, is 55 to 60%. The left ventricle has normal function. Normal left ventricular size. There is mildly increased left ventricular hypertrophy.  2. Elevated left ventricular end-diastolic pressure.  3. Left  ventricular diastolic Doppler parameters are consistent with pseudonormalization pattern of LV  diastolic filling.  4. Global right ventricle has normal systolic function.The right ventricular size is normal. No increase in right ventricular wall thickness.  5. Left atrial size was moderately dilated.  6. Right atrial size was normal.  7. The mitral valve is normal in structure. Mild mitral valve regurgitation. No evidence of mitral stenosis.  8. The tricuspid valve is normal in structure. Tricuspid valve regurgitation is mild.  9. The aortic valve is normal in structure. Aortic valve regurgitation is mild to moderate by color flow Doppler. Structurally normal aortic valve, with no evidence of sclerosis or stenosis. 10. The pulmonic valve was normal in structure. Pulmonic valve regurgitation is not visualized by color flow Doppler. 11. The inferior vena cava is normal in size with greater than 50% respiratory variability, suggesting right atrial pressure of 3 mmHg.   Assessment and Plan: 1. Persistent atrial fibrillation S/p dofetilide loading 9/15-9/18/20. Patient appears to be maintaining SR. Check bmet/mag today. Continue dofetilide 500 mcg BID. QT stable. Continue Xarelto 20 mg daily Continue bisoprolol 2.5 mg daily.  This patients CHA2DS2-VASc Score and unadjusted Ischemic Stroke Rate (% per year) is equal to 2.2 % stroke rate/year from a score of 2  Above score calculated as 1 point each if present [CHF, HTN, DM, Vascular=MI/PAD/Aortic Plaque, Age if 65-74, or Male] Above score calculated as 2 points each if present [Age > 75, or Stroke/TIA/TE]  2. CAD DES to LAD 06/11/17. LHC 11/2020 showed nonobstructive disease.   Follow up with [name] per recall, AF clinic in one year.    [facility] [address] [place], [place] [address] [phone]

## 2022-03-24 ENCOUNTER — Other Ambulatory Visit (HOSPITAL_COMMUNITY): Payer: Self-pay | Admitting: Physician Assistant

## 2022-05-12 ENCOUNTER — Other Ambulatory Visit (HOSPITAL_COMMUNITY): Payer: Self-pay | Admitting: Physician Assistant

## 2022-06-18 ENCOUNTER — Telehealth: Payer: Self-pay | Admitting: Cardiovascular Disease

## 2022-06-18 NOTE — Telephone Encounter (Signed)
Called pt, he states my daughter called in. Asked pt how he is feeling he states "I feel fine now. I;m back to normal." Pt asked about his April 12th appt with Denyse Amass. I explained Denyse Amass works with Dr. Claiborne Billings. He verbalized understanding and thanked me for calling.

## 2022-06-18 NOTE — Telephone Encounter (Signed)
STAT if HR is under 50 or over 120 (normal HR is 60-100 beats per minute)  What is your heart rate? 128  Do you have a log of your heart rate readings (document readings)? Yes   Do you have any other symptoms? Fatigue,

## 2022-07-30 NOTE — Progress Notes (Signed)
Cardiology Clinic Note   Patient Name: Jose Warner Date of Encounter: 08/01/2022  Primary Care Provider:  Mosetta Putt, MD Primary Cardiologist:  Nicki Guadalajara, MD  Patient Profile    Jose Warner 72 year old male presents the clinic today for follow-up evaluation of his persistent atrial fibrillation and coronary artery disease.  Past Medical History    Past Medical History:  Diagnosis Date   Anemia    Bronchitis 05-10-12   past fall- 4 runs antibiotics due to bronchitis-uses Pro Air as needed   Celiac disease    Colon polyps    adenomatous   Coronary artery disease    Diverticulosis    GERD (gastroesophageal reflux disease)    Hepatitis 05-10-12   "was told non A, non B"-unclean dental equip.   Hyperlipidemia    Iron deficiency anemia    Pericarditis    Persistent atrial fibrillation 06/05/2017   Prostate cancer 05-10-12   ;bx. 04-12-12, dx   Spasm of esophagus 2013   Past Surgical History:  Procedure Laterality Date   CARDIAC CATHETERIZATION  09/13/2004   LAD:30%-40% in prox. to mid segment with 20% in the mid segment and 20% in left Circ., mild 20% right common iliac narrowing. medical therapy   CARDIOVERSION  06/05/2017   CARDIOVERSION N/A 11/11/2018   Procedure: CARDIOVERSION;  Surgeon: Chrystie Nose, MD;  Location: [facility] ENDOSCOPY;  Service: Cardiovascular;  Laterality: N/A;   CORONARY STENT INTERVENTION N/A 06/11/2017   Procedure: CORONARY STENT INTERVENTION;  Surgeon: Lyn Records, MD;  Location: [facility] INVASIVE CV LAB;  Service: Cardiovascular;  Laterality: N/A;   HERNIA REPAIR  5 yrs ago   right    LEFT HEART CATH AND CORONARY ANGIOGRAPHY N/A 06/11/2017   Procedure: LEFT HEART CATH AND CORONARY ANGIOGRAPHY;  Surgeon: Lyn Records, MD;  Location: [facility] INVASIVE CV LAB;  Service: Cardiovascular;  Laterality: N/A;   LEFT HEART CATH AND CORONARY ANGIOGRAPHY N/A 08/18/2017   Procedure: LEFT HEART CATH AND CORONARY ANGIOGRAPHY;  Surgeon: Swaziland, Peter M,  MD;  Location: [facility] INVASIVE CV LAB;  Service: Cardiovascular;  Laterality: N/A;   LEFT HEART CATH AND CORONARY ANGIOGRAPHY N/A 12/12/2020   Procedure: LEFT HEART CATH AND CORONARY ANGIOGRAPHY;  Surgeon: Lennette Bihari, MD;  Location: [facility] INVASIVE CV LAB;  Service: Cardiovascular;  Laterality: N/A;   NM MYOCAR PERF WALL MOTION  11/30/2007   protocol:Bruce, post EF 67%,mild perfusion defect seen in basal inferior consistant with Attenuation artifact, exercise cap.   ROBOT ASSISTED LAPAROSCOPIC RADICAL PROSTATECTOMY  05/20/2012   Procedure: ROBOTIC ASSISTED LAPAROSCOPIC RADICAL PROSTATECTOMY LEVEL 1;  Surgeon: Crecencio Mc, MD;  Location: [facility] ORS;  Service: Urology;  Laterality: N/A;      TRANSURETHRAL RESECTION OF PROSTATE  15yrs ago    Allergies  Allergies  Allergen Reactions   Doxycycline Hives   Gluten Meal Other (See Comments)    Celiac, stomach crams , gas   Wheat Other (See Comments)    Celiac    Statins     Leg muscle pains - rosuvastatin, atorvastatin, zetia. Tolerates simvastatin   Sulfa Antibiotics Rash    History of Present Illness    Jose Warner has a PMH of hyperlipidemia, coronary artery disease, prostate CA, persistent atrial fibrillation on Xarelto, and asthma.  CHA2DS2-VASc score 2.  He was loaded on Tikosyn 9/15 - 01/07/2019 and converted to sinus rhythm.  He did not require cardioversion.  He was seen in the emergency department 08/02/2019 for acute chest pain.  He was diagnosed with esophageal candidiasis.  He had been on steroids and antibiotics for sinus infection.  His chest pain resolved with GI cocktail.  He presented to the emergency department several x 8/22 and underwent cardiac catheterization on 12/12/2020.  He was noted to have nonobstructive CAD.  He followed up with Alphonzo Severance, PA-C 10/02/2021.  During that time he was doing well from a cardiac standpoint he denied palpitations and accelerated heart rate.  He was using a smart watch.  It was showing  sinus rhythm.  He denied bleeding issues.  He presents to the clinic today for follow-up evaluation and states he has had intermittent episodes of brief chest discomfort lasting from 5-10 seconds.  His episodes produce sharp type chest pain and dissipate without intervention/rest.  He reports taking nitroglycerin x 1 for chest discomfort and is unsure if it helped.  He was able to move house in July and denies exertional chest pain with those activities.  We reviewed his recent lab work and he expressed understanding.  He is now taking Zebeta quarter tablet and his EKG shows 58 bpm.  We discussed taking an 0.25 extra tablet for sustained episodes of elevated heart rate.  I also reviewed triggers and encouraged increased p.o. hydration.  I will have him increase his physical activity as tolerated and plan follow-up in 12 months.  Today he denies increased shortness of breath, lower extremity edema, fatigue, palpitations, melena, hematuria, hemoptysis, diaphoresis, weakness, presyncope, syncope, orthopnea, and PND.     Home Medications    Prior to Admission medications   Medication Sig Start Date End Date Taking? Authorizing Provider  amLODipine (NORVASC) 2.5 MG tablet Take 1 tablet (2.5 mg total) by mouth daily. 08/31/17 10/02/21  Lennette Bihari, MD  aspirin EC 81 MG tablet Take 1 tablet (81 mg total) by mouth daily. 01/19/18   Lennette Bihari, MD  b complex vitamins tablet Take 1 tablet by mouth daily with supper.    [provider]  bisoprolol (ZEBETA) 5 MG tablet Take 2.5 mg by mouth daily.     [provider]  budesonide-formoterol [facility]) 80-4.5 MCG/ACT inhaler SMARTSIG:By Mouth 03/06/20   [provider]  dofetilide (TIKOSYN) 500 MCG capsule TAKE 1 CAPSULE TWICE A DAY 05/12/22   Fenton, Clint R, PA  ezetimibe (ZETIA) 10 MG tablet Take 10 mg by mouth every evening.     [provider]  fluticasone (FLONASE) 50 MCG/ACT nasal spray Place 1 spray into both  nostrils as needed for allergies.    [provider]  guaiFENesin (MUCINEX) 600 MG 12 hr tablet Take 600 mg by mouth daily.    [provider]  Homeopathic Products [facility] COLD REMEDY PO) Take 1 tablet by mouth in the morning and at bedtime.    [provider]  Multiple Vitamin (MULTIVITAMIN WITH MINERALS) TABS tablet Take 1 tablet by mouth daily with supper.    [provider]  nitroGLYCERIN (NITROSTAT) 0.4 MG SL tablet Place 1 tablet (0.4 mg total) under the tongue every 5 (five) minutes x 3 doses as needed for chest pain. 03/28/20   Lennette Bihari, MD  pantoprazole (PROTONIX) 40 MG tablet Take 40 mg by mouth 2 (two) times daily.  06/24/17   [provider]  pravastatin (PRAVACHOL) 80 MG tablet Take 80 mg by mouth every evening.     [provider]  PROAIR HFA 108 (90 BASE) MCG/ACT inhaler Take 2 puffs by mouth every 4 (four) hours as  needed for wheezing or shortness of breath. For shortness of breath. 02/13/12   [provider]  rivaroxaban (XARELTO) 20 MG TABS tablet Take 1 tablet (20 mg total) by mouth daily with supper. 03/24/22   Fenton, Clint R, PA  Vitamin D, Ergocalciferol, 2000 units CAPS Take 2,000 Units by mouth at bedtime.     [provider]    Family History    Family History  Problem Relation Age of Onset   Stroke Mother    Heart disease Father    Heart attack Father    Cancer Sister    Stroke Maternal Grandfather    Cancer Paternal Grandmother    Heart attack Paternal Grandfather    Heart disease Paternal Grandfather    Colon cancer Neg Hx    Esophageal cancer Neg Hx    Pancreatic cancer Neg Hx    Rectal cancer Neg Hx    Stomach cancer Neg Hx    He indicated that his mother is deceased. He indicated that his father is deceased. He indicated that both of his sisters are deceased. He indicated that both of his brothers are alive. He indicated that his maternal grandfather is deceased. He indicated that  his paternal grandmother is deceased. He indicated that his paternal grandfather is deceased. He indicated that the status of his neg hx is unknown.  Social History    Social History   Socioeconomic History   Marital status: Married    Spouse name: Not on file   Number of children: 3   Years of education: Not on file   Highest education level: Not on file  Occupational History    Employer: [employer]  Tobacco Use   Smoking status: Never   Smokeless tobacco: Never   Tobacco comments:    Never smoke 10/02/21  Vaping Use   Vaping Use: Never used  Substance and Sexual Activity   Alcohol use: Not Currently    Alcohol/week: 2.0 standard drinks of alcohol    Types: 2 Glasses of wine per week   Drug use: No   Sexual activity: Yes    Partners: Female  Other Topics Concern   Not on file  Social History Narrative   Not on file   Social Determinants of Health   Financial Resource Strain: Not on file  Food Insecurity: Unknown (06/10/2017)   Hunger Vital Sign    Worried About Running Out of Food in the Last Year: Patient declined    Barista in the Last Year: Patient declined  Transportation Needs: Unknown (06/10/2017)   PRAPARE - Administrator, Civil Service (Medical): Patient declined    Lack of Transportation (Non-Medical): Patient declined  Physical Activity: Not on file  Stress: Not on file  Social Connections: Unknown (06/10/2017)   Social Connection and Isolation Panel [NHANES]    Frequency of Communication with Friends and Family: Patient declined    Frequency of Social Gatherings with Friends and Family: Patient declined    Attends Religious Services: Patient declined    Database administrator or Organizations: Patient declined    Attends Banker Meetings: Patient declined    Marital Status: Patient declined  Intimate Partner Violence: Unknown (06/10/2017)   Humiliation, Afraid, Rape, and Kick questionnaire    Fear of Current or Ex-Partner:  Patient declined    Emotionally Abused: Patient declined    Physically Abused: Patient declined    Sexually Abused: Patient declined     Review of Systems  General:  No chills, fever, night sweats or weight changes.  Cardiovascular:  No chest pain, dyspnea on exertion, edema, orthopnea, palpitations, paroxysmal nocturnal dyspnea. Dermatological: No rash, lesions/masses Respiratory: No cough, dyspnea Urologic: No hematuria, dysuria Abdominal:   No nausea, vomiting, diarrhea, bright red blood per rectum, melena, or hematemesis Neurologic:  No visual changes, wkns, changes in mental status. All other systems reviewed and are otherwise negative except as noted above.  Physical Exam    VS:  BP 124/74   Ht 5\' 8"  (1.727 m)   Wt 181 lb 12.8 oz (82.5 kg)   SpO2 96%   BMI 27.64 kg/m  , BMI Body mass index is 27.64 kg/m. GEN: Well nourished, well developed, in no acute distress. HEENT: normal. Neck: Supple, no JVD, carotid bruits, or masses. Cardiac: RRR, no murmurs, rubs, or gallops. No clubbing, cyanosis, edema.  Radials/DP/PT 2+ and equal bilaterally.  Respiratory:  Respirations regular and unlabored, clear to auscultation bilaterally. GI: Soft, nontender, nondistended, BS + x 4. MS: no deformity or atrophy. Skin: warm and dry, no rash. Neuro:  Strength and sensation are intact. Psych: Normal affect.  Accessory Clinical Findings    Recent Labs: 10/02/2021: BUN 14; Creatinine, Ser 1.14; Magnesium 2.0; Potassium 4.6; Sodium 137   Recent Lipid Panel    Component Value Date/Time   CHOL 147 12/12/2020 0201   TRIG 150 (H) 12/12/2020 0201   HDL 40 (L) 12/12/2020 0201   CHOLHDL 3.7 12/12/2020 0201   VLDL 30 12/12/2020 0201   LDLCALC 77 12/12/2020 0201         ECG personally reviewed by me today-sinus bradycardia 58 bpm  Echocardiogram 01/07/2019 IMPRESSIONS     1. Left ventricular ejection fraction, by visual estimation, is 55 to  60%. The left ventricle has normal  function. Normal left ventricular size.  There is mildly increased left ventricular hypertrophy.   2. Elevated left ventricular end-diastolic pressure.   3. Left ventricular diastolic Doppler parameters are consistent with  pseudonormalization pattern of LV diastolic filling.   4. Global right ventricle has normal systolic function.The right  ventricular size is normal. No increase in right ventricular wall  thickness.   5. Left atrial size was moderately dilated.   6. Right atrial size was normal.   7. The mitral valve is normal in structure. Mild mitral valve  regurgitation. No evidence of mitral stenosis.   8. The tricuspid valve is normal in structure. Tricuspid valve  regurgitation is mild.   9. The aortic valve is normal in structure. Aortic valve regurgitation is  mild to moderate by color flow Doppler. Structurally normal aortic valve,  with no evidence of sclerosis or stenosis.  10. The pulmonic valve was normal in structure. Pulmonic valve  regurgitation is not visualized by color flow Doppler.  11. The inferior vena cava is normal in size with greater than 50%  respiratory variability, suggesting right atrial pressure of 3 mmHg.   FINDINGS   Left Ventricle: Left ventricular ejection fraction, by visual estimation,  is 55 to 60%. The left ventricle has normal function. There is mildly  increased left ventricular hypertrophy. Concentric left ventricular  hypertrophy. Normal left ventricular  size. Spectral Doppler shows Left ventricular diastolic Doppler parameters  are consistent with pseudonormalization pattern of LV diastolic filling.  Elevated left ventricular end-diastolic pressure.   Right Ventricle: The right ventricular size is normal. No increase in  right ventricular wall thickness. Global RV systolic function is has  normal systolic function.   Left  Atrium: Left atrial size was moderately dilated.   Right Atrium: Right atrial size was normal in size    Pericardium: There is no evidence of pericardial effusion.   Mitral Valve: The mitral valve is normal in structure. No evidence of  mitral valve stenosis by observation. Mild mitral valve regurgitation.   Tricuspid Valve: The tricuspid valve is normal in structure. Tricuspid  valve regurgitation is mild by color flow Doppler.   Aortic Valve: The aortic valve is normal in structure. Aortic valve  regurgitation is mild to moderate by color flow Doppler. Aortic  regurgitation PHT measures 498 msec. The aortic valve is structurally  normal, with no evidence of sclerosis or stenosis.   Pulmonic Valve: The pulmonic valve was normal in structure. Pulmonic valve  regurgitation is not visualized by color flow Doppler.   Aorta: The aortic root, ascending aorta and aortic arch are all  structurally normal, with no evidence of dilitation or obstruction.   Venous: The inferior vena cava is normal in size with greater than 50%  respiratory variability, suggesting right atrial pressure of 3 mmHg.   IAS/Shunts: No atrial level shunt detected by color flow Doppler. No  ventricular septal defect is seen or detected. There is no evidence of an  atrial septal defect.    Cardiac catheterization 12/12/2020   Ost LAD to Prox LAD lesion is 30% stenosed.   Mid LAD to Dist LAD lesion is 30% stenosed.   Previously placed Mid LAD stent (unknown type) is  widely patent.   LV end diastolic pressure is low.   The left ventricular ejection fraction is 55-65% by visual estimate.   Mild nonobstructive residual CAD with mild to moderate proximal to mid LAD calcification with 30% smooth proximal stenosis, widely patent LAD stent, mild 30% smooth stenosis beyond the stented segment; normal left circumflex and normal dominant RCA.   Normal LV function with EF estimated at 55 to 65% without focal segmental wall motion abnormalities.  LVEDP 8 mmHg.   RECOMMENDATION: Suspect nonischemic chest pain.  Patient is  currently in sinus rhythm.  Recommend resumption of Xarelto tomorrow.  Probably okay for discharge later today.  Continue aggressive lipid-lowering therapy with target LDL less than 70 and optimal blood pressure control.   Assessment & Plan   1.  Persistent atrial fibrillation-EKG today shows sinus bradycardia with prolonged QTc.  Reports compliance with Xarelto and denies bleeding issues. Continue Tikosyn, Xarelto Avoid triggers caffeine, chocolate, EtOH, dehydration etc.  Coronary artery disease-no chest pain today.  Underwent left heart cath 2022 which showed nonobstructive CAD. Continue amlodipine, aspirin, bisoprolol, ezetimibe, pravastatin Heart healthy low-sodium diet  Hyperlipidemia-LDL 77 on 12/12/20. Heart healthy low-sodium high-fiber diet Continue Zetia, pravastatin Follows with PCP  Essential hypertension-BP today 124/72. Continue bisoprolol, amlodipine Heart healthy low-sodium diet-salty 6 given Increase physical activity as tolerated Maintain blood pressure log  Disposition: Follow-up with Dr. Tresa EndoKelly or me in 1 year.   Thomasene RippleJesse M. Dyamon Sosinski NP-C     08/01/2022, 2:03 PM [facility] [address] Office [phone] Fax [phone]    I spent 14 minutes examining this patient, reviewing medications, and using patient centered shared decision making involving her cardiac care.  Prior to her visit I spent greater than 20 minutes reviewing her past medical history,  medications, and prior cardiac tests.

## 2022-08-01 ENCOUNTER — Ambulatory Visit: Payer: Medicare Other | Attending: General Practice | Admitting: General Practice

## 2022-08-01 ENCOUNTER — Encounter: Payer: Self-pay | Admitting: General Practice

## 2022-08-01 VITALS — BP 124/74 | Ht 68.0 in | Wt 181.8 lb

## 2022-08-01 DIAGNOSIS — E782 Mixed hyperlipidemia: Secondary | ICD-10-CM | POA: Diagnosis not present

## 2022-08-01 DIAGNOSIS — I4819 Other persistent atrial fibrillation: Secondary | ICD-10-CM

## 2022-08-01 DIAGNOSIS — I2510 Atherosclerotic heart disease of native coronary artery without angina pectoris: Secondary | ICD-10-CM | POA: Diagnosis not present

## 2022-08-01 DIAGNOSIS — I10 Essential (primary) hypertension: Secondary | ICD-10-CM

## 2022-08-01 MED ORDER — NITROGLYCERIN 0.4 MG SL SUBL: 0.4000 mg | SUBLINGUAL_TABLET | SUBLINGUAL | 1 refills | Status: AC | PRN

## 2022-08-01 NOTE — Patient Instructions (Signed)
Medication Instructions:  MAY TAKE EXTRA 1/4 TAB FOR SUSTAINED INCREASED HEART RATE *If you need a refill on your cardiac medications before your next appointment, please call your pharmacy*  Lab Work: NONE If you have labs (blood work) drawn today and your tests are completely normal, you will receive your results only by:  MyChart Message (if you have MyChart) OR A paper copy in the mail If you have any lab test that is abnormal or we need to change your treatment, we will call you to review the results.  Other Instructions Please try to avoid these triggers: Do not use any products that have nicotine or tobacco in them. These include cigarettes, e-cigarettes, and chewing tobacco. If you need help quitting, ask your doctor. Eat heart-healthy foods. Talk with your doctor about the right eating plan for you. Exercise regularly as told by your doctor. Stay hydrated Do not drink alcohol, Caffeine or chocolate. Lose weight if you are overweight. Do not use drugs, including cannabis   MAINTAIN HYDRATION   INCREASE PHYSICAL ACTIVITY AS TOLERATED  Follow-Up: At [facility], you and your health needs are our priority.  As part of our continuing mission to provide you with exceptional heart care, we have created designated Provider Care Teams.  These Care Teams include your primary Cardiologist (physician) and Advanced Practice Providers (APPs -  Physician Assistants and Nurse Practitioners) who all work together to provide you with the care you need, when you need it.  Your next appointment:   12 month(s)  Provider:   Nicki Guadalajara, MD  or Edd Fabian, FNP

## 2022-08-04 ENCOUNTER — Other Ambulatory Visit (INDEPENDENT_AMBULATORY_CARE_PROVIDER_SITE_OTHER): Payer: Medicare Other

## 2022-08-04 DIAGNOSIS — I2510 Atherosclerotic heart disease of native coronary artery without angina pectoris: Secondary | ICD-10-CM | POA: Diagnosis not present

## 2022-08-04 DIAGNOSIS — Z9861 Coronary angioplasty status: Secondary | ICD-10-CM

## 2022-08-04 DIAGNOSIS — Z5181 Encounter for therapeutic drug level monitoring: Secondary | ICD-10-CM

## 2022-08-04 DIAGNOSIS — Z8546 Personal history of malignant neoplasm of prostate: Secondary | ICD-10-CM

## 2022-08-04 DIAGNOSIS — I10 Essential (primary) hypertension: Secondary | ICD-10-CM | POA: Diagnosis not present

## 2022-08-04 DIAGNOSIS — D6869 Other thrombophilia: Secondary | ICD-10-CM

## 2022-08-04 DIAGNOSIS — E785 Hyperlipidemia, unspecified: Secondary | ICD-10-CM

## 2022-08-04 DIAGNOSIS — E782 Mixed hyperlipidemia: Secondary | ICD-10-CM | POA: Diagnosis not present

## 2022-08-04 DIAGNOSIS — I4819 Other persistent atrial fibrillation: Secondary | ICD-10-CM | POA: Diagnosis not present

## 2022-08-04 DIAGNOSIS — Z7901 Long term (current) use of anticoagulants: Secondary | ICD-10-CM

## 2022-08-04 DIAGNOSIS — I480 Paroxysmal atrial fibrillation: Secondary | ICD-10-CM

## 2022-08-04 DIAGNOSIS — I319 Disease of pericardium, unspecified: Secondary | ICD-10-CM

## 2022-09-16 ENCOUNTER — Ambulatory Visit
Admission: RE | Admit: 2022-09-16 | Discharge: 2022-09-16 | Disposition: A | Discharge status: Home / Self Care | Payer: Medicare Other | Source: Ambulatory Visit | Attending: Family Medicine | Admitting: Family Medicine

## 2022-09-16 ENCOUNTER — Other Ambulatory Visit: Payer: Self-pay | Admitting: Family Medicine

## 2022-09-16 DIAGNOSIS — R059 Cough, unspecified: Secondary | ICD-10-CM

## 2022-10-02 ENCOUNTER — Ambulatory Visit (HOSPITAL_COMMUNITY)
Admission: RE | Admit: 2022-10-02 | Discharge: 2022-10-02 | Disposition: A | Discharge status: Home / Self Care | Payer: Medicare Other | Source: Ambulatory Visit | Attending: Physician Assistant | Admitting: Physician Assistant

## 2022-10-02 VITALS — BP 122/64 | HR 64 | Ht 68.0 in | Wt 173.0 lb

## 2022-10-02 DIAGNOSIS — D6869 Other thrombophilia: Secondary | ICD-10-CM | POA: Insufficient documentation

## 2022-10-02 DIAGNOSIS — I2510 Atherosclerotic heart disease of native coronary artery without angina pectoris: Secondary | ICD-10-CM | POA: Diagnosis not present

## 2022-10-02 DIAGNOSIS — Z7982 Long term (current) use of aspirin: Secondary | ICD-10-CM | POA: Insufficient documentation

## 2022-10-02 DIAGNOSIS — I4819 Other persistent atrial fibrillation: Secondary | ICD-10-CM | POA: Diagnosis not present

## 2022-10-02 DIAGNOSIS — I083 Combined rheumatic disorders of mitral, aortic and tricuspid valves: Secondary | ICD-10-CM | POA: Diagnosis not present

## 2022-10-02 DIAGNOSIS — Z8249 Family history of ischemic heart disease and other diseases of the circulatory system: Secondary | ICD-10-CM | POA: Diagnosis not present

## 2022-10-02 DIAGNOSIS — Z5181 Encounter for therapeutic drug level monitoring: Secondary | ICD-10-CM

## 2022-10-02 DIAGNOSIS — Z79899 Other long term (current) drug therapy: Secondary | ICD-10-CM | POA: Insufficient documentation

## 2022-10-02 DIAGNOSIS — Z7901 Long term (current) use of anticoagulants: Secondary | ICD-10-CM | POA: Insufficient documentation

## 2022-10-02 DIAGNOSIS — E785 Hyperlipidemia, unspecified: Secondary | ICD-10-CM | POA: Insufficient documentation

## 2022-10-02 LAB — BASIC METABOLIC PANEL
Anion gap: 11 (ref 5–15)
BUN: 10 mg/dL (ref 8–23)
CO2: 22 mmol/L (ref 22–32)
Calcium: 9.2 mg/dL (ref 8.9–10.3)
Chloride: 107 mmol/L (ref 98–111)
Creatinine, Ser: 1.03 mg/dL (ref 0.61–1.24)
GFR, Estimated: >60 mL/min (ref >60)
Glucose, Bld: 106 mg/dL — ABNORMAL HIGH (ref 70–99)
Potassium: 4.4 mmol/L (ref 3.5–5.1)
Sodium: 140 mmol/L (ref 135–145)

## 2022-10-02 LAB — MAGNESIUM: Magnesium: 2.3 mg/dL (ref 1.7–2.4)

## 2022-10-02 NOTE — Progress Notes (Signed)
Primary Care Physician: Mosetta Putt, MD Referring Physician: [facility] F/u Primary Cardiologist: Dr Tresa Endo Primary EP: Dr Franciso Bend Jose Warner is a 72 y.o. male with a h/o persistent afib, HLD, CAD, and prostate cancer who presents to the [facility] Clinic for follow up. Patient loaded on dofetilide 9/15-9/18/20. He converted to SR on the medication and did not require DCCV. He is on Xarelto for a CHADS2VASC score of 2. Patient was seen in the ER on 08/02/19 for acute CP. He was diagnosed with esophageal candidiasis (he had recently been on steroids and abx for a sinus infection). His CP resolved with GI cocktail.   Patient seen at ED several times 11/2020 for CP and had LHC on 12/12/20 which showed nonobstructive CAD.   On follow up today, patient reports that he has done well since his last visit. He has not had any symptoms of afib. No bleeding issues on anticoagulation.   Today, he denies symptoms of palpitations, SOB, orthopnea, PND, lower extremity edema, presyncope, syncope, or neurologic sequela. The patient is tolerating medications without difficulties and is otherwise without complaint today.   Past Medical History:  Diagnosis Date   Anemia    Bronchitis 05-10-12   past fall- 4 runs antibiotics due to bronchitis-uses Pro Air as needed   Celiac disease    Colon polyps    adenomatous   Coronary artery disease    Diverticulosis    GERD (gastroesophageal reflux disease)    Hepatitis 05-10-12   "was told non A, non B"-unclean dental equip.   Hyperlipidemia    Iron deficiency anemia    Pericarditis    Persistent atrial fibrillation (HCC) 06/05/2017   Prostate cancer (HCC) 05-10-12   ;bx. 04-12-12, dx   Spasm of esophagus 2013   Past Surgical History:  Procedure Laterality Date   CARDIAC CATHETERIZATION  09/13/2004   LAD:30%-40% in prox. to mid segment with 20% in the mid segment and 20% in left Circ., mild 20% right common iliac narrowing. medical therapy    CARDIOVERSION  06/05/2017   CARDIOVERSION N/A 11/11/2018   Procedure: CARDIOVERSION;  Surgeon: Chrystie Nose, MD;  Location: [facility] ENDOSCOPY;  Service: Cardiovascular;  Laterality: N/A;   CORONARY STENT INTERVENTION N/A 06/11/2017   Procedure: CORONARY STENT INTERVENTION;  Surgeon: Lyn Records, MD;  Location: [facility] INVASIVE CV LAB;  Service: Cardiovascular;  Laterality: N/A;   HERNIA REPAIR  5 yrs ago   right    LEFT HEART CATH AND CORONARY ANGIOGRAPHY N/A 06/11/2017   Procedure: LEFT HEART CATH AND CORONARY ANGIOGRAPHY;  Surgeon: Lyn Records, MD;  Location: [facility] INVASIVE CV LAB;  Service: Cardiovascular;  Laterality: N/A;   LEFT HEART CATH AND CORONARY ANGIOGRAPHY N/A 08/18/2017   Procedure: LEFT HEART CATH AND CORONARY ANGIOGRAPHY;  Surgeon: Swaziland, Peter M, MD;  Location: [facility] INVASIVE CV LAB;  Service: Cardiovascular;  Laterality: N/A;   LEFT HEART CATH AND CORONARY ANGIOGRAPHY N/A 12/12/2020   Procedure: LEFT HEART CATH AND CORONARY ANGIOGRAPHY;  Surgeon: Lennette Bihari, MD;  Location: [facility] INVASIVE CV LAB;  Service: Cardiovascular;  Laterality: N/A;   NM MYOCAR PERF WALL MOTION  11/30/2007   protocol:Bruce, post EF 67%,mild perfusion defect seen in basal inferior consistant with Attenuation artifact, exercise cap.   ROBOT ASSISTED LAPAROSCOPIC RADICAL PROSTATECTOMY  05/20/2012   Procedure: ROBOTIC ASSISTED LAPAROSCOPIC RADICAL PROSTATECTOMY LEVEL 1;  Surgeon: Crecencio Mc, MD;  Location: [facility] ORS;  Service: Urology;  Laterality: N/A;  TRANSURETHRAL RESECTION OF PROSTATE  43yrs ago    Current Outpatient Medications  Medication Sig Dispense Refill   amLODipine (NORVASC) 2.5 MG tablet Take 1 tablet (2.5 mg total) by mouth daily. 90 tablet 3   aspirin EC 81 MG tablet Take 1 tablet (81 mg total) by mouth daily. 90 tablet 3   b complex vitamins tablet Take 1 tablet by mouth daily with supper.     bisoprolol (ZEBETA) 5 MG tablet Take 1.7 mg by mouth daily. This is 1/4 tab      budesonide-formoterol (SYMBICORT) 80-4.5 MCG/ACT inhaler SMARTSIG:By Mouth     dofetilide (TIKOSYN) 500 MCG capsule TAKE 1 CAPSULE TWICE A DAY 180 capsule 1   ezetimibe (ZETIA) 10 MG tablet Take 10 mg by mouth every evening.      fluticasone (FLONASE) 50 MCG/ACT nasal spray Place 1 spray into both nostrils as needed for allergies.     guaiFENesin (MUCINEX) 600 MG 12 hr tablet Take 600 mg by mouth daily.     Homeopathic Products (ZICAM COLD REMEDY PO) Take 1 tablet by mouth in the morning and at bedtime.     Multiple Vitamin (MULTIVITAMIN WITH MINERALS) TABS tablet Take 1 tablet by mouth daily with supper.     nitroGLYCERIN (NITROSTAT) 0.4 MG SL tablet Place 1 tablet (0.4 mg total) under the tongue every 5 (five) minutes x 3 doses as needed for chest pain. 25 tablet 1   pantoprazole (PROTONIX) 40 MG tablet Take 40 mg by mouth 2 (two) times daily.      pravastatin (PRAVACHOL) 80 MG tablet Take 80 mg by mouth every evening.      PROAIR HFA 108 (90 BASE) MCG/ACT inhaler Take 2 puffs by mouth every 4 (four) hours as needed for wheezing or shortness of breath. For shortness of breath.     rivaroxaban (XARELTO) 20 MG TABS tablet Take 1 tablet (20 mg total) by mouth daily with supper. 90 tablet 2   Vitamin D, Ergocalciferol, 2000 units CAPS Take 2,000 Units by mouth at bedtime.      No current facility-administered medications for this encounter.    Allergies  Allergen Reactions   Doxycycline Hives   Gluten Meal Other (See Comments)    Celiac, stomach crams , gas   Wheat Other (See Comments)    Celiac    Statins     Leg muscle pains - rosuvastatin, atorvastatin, zetia. Tolerates simvastatin   Sulfa Antibiotics Rash    Social History   Socioeconomic History   Marital status: Married    Spouse name: Not on file   Number of children: 3   Years of education: Not on file   Highest education level: Not on file  Occupational History    Employer: [employer]  Tobacco Use   Smoking status: Never    Smokeless tobacco: Never   Tobacco comments:    Never smoke 10/02/21  Vaping Use   Vaping Use: Never used  Substance and Sexual Activity   Alcohol use: Not Currently    Alcohol/week: 2.0 standard drinks of alcohol    Types: 2 Glasses of wine per week   Drug use: No   Sexual activity: Yes    Partners: Female  Other Topics Concern   Not on file  Social History Narrative   Not on file   Social Determinants of Health   Financial Resource Strain: Not on file  Food Insecurity: Unknown (06/10/2017)   Hunger Vital Sign    Worried About Running Out of  Food in the Last Year: Patient declined    Ran Out of Food in the Last Year: Patient declined  Transportation Needs: Unknown (06/10/2017)   PRAPARE - Administrator, Civil Service (Medical): Patient declined    Lack of Transportation (Non-Medical): Patient declined  Physical Activity: Not on file  Stress: Not on file  Social Connections: Unknown (06/10/2017)   Social Connection and Isolation Panel [NHANES]    Frequency of Communication with Friends and Family: Patient declined    Frequency of Social Gatherings with Friends and Family: Patient declined    Attends Religious Services: Patient declined    Database administrator or Organizations: Patient declined    Attends Banker Meetings: Patient declined    Marital Status: Patient declined  Intimate Partner Violence: Unknown (06/10/2017)   Humiliation, Afraid, Rape, and Kick questionnaire    Fear of Current or Ex-Partner: Patient declined    Emotionally Abused: Patient declined    Physically Abused: Patient declined    Sexually Abused: Patient declined    Family History  Problem Relation Age of Onset   Stroke Mother    Heart disease Father    Heart attack Father    Cancer Sister    Stroke Maternal Grandfather    Cancer Paternal Grandmother    Heart attack Paternal Grandfather    Heart disease Paternal Grandfather    Colon cancer Neg Hx    Esophageal  cancer Neg Hx    Pancreatic cancer Neg Hx    Rectal cancer Neg Hx    Stomach cancer Neg Hx     ROS- All systems are reviewed and negative except as per the HPI above  Physical Exam: Vitals:   10/02/22 0830  Height: 5\' 8"  (1.727 m)   Wt Readings from Last 3 Encounters:  08/01/22 82.5 kg  10/02/21 79.7 kg  05/16/21 81.2 kg    GEN- The patient is a well appearing male, alert and oriented x 3 today.   HEENT-head normocephalic, atraumatic, sclera clear, conjunctiva pink, hearing intact, trachea midline. Lungs- Clear to ausculation bilaterally, normal work of breathing Heart- Regular rate and rhythm, no murmurs, rubs or gallops  GI- soft, NT, ND, + BS Extremities- no clubbing, cyanosis, or edema MS- no significant deformity or atrophy Skin- no rash or lesion Psych- euthymic mood, full affect Neuro- strength and sensation are intact   EKG today demonstrates SR Vent. rate 64 BPM PR interval 166 ms QRS duration 86 ms QT/QTcB 444/458 ms   Epic records reviewed   Echo 01/07/19 demonstrated  1. Left ventricular ejection fraction, by visual estimation, is 55 to 60%. The left ventricle has normal function. Normal left ventricular size. There is mildly increased left ventricular hypertrophy.  2. Elevated left ventricular end-diastolic pressure.  3. Left ventricular diastolic Doppler parameters are consistent with pseudonormalization pattern of LV diastolic filling.  4. Global right ventricle has normal systolic function.The right ventricular size is normal. No increase in right ventricular wall thickness.  5. Left atrial size was moderately dilated.  6. Right atrial size was normal.  7. The mitral valve is normal in structure. Mild mitral valve regurgitation. No evidence of mitral stenosis.  8. The tricuspid valve is normal in structure. Tricuspid valve regurgitation is mild.  9. The aortic valve is normal in structure. Aortic valve regurgitation is mild to moderate by color flow  Doppler. Structurally normal aortic valve, with no evidence of sclerosis or stenosis. 10. The pulmonic valve was normal in structure. Pulmonic  valve regurgitation is not visualized by color flow Doppler. 11. The inferior vena cava is normal in size with greater than 50% respiratory variability, suggesting right atrial pressure of 3 mmHg.   CHA2DS2-VASc Score = 2  The patient's score is based upon: CHF History: 0 HTN History: 0 Diabetes History: 0 Stroke History: 0 Vascular Disease History: 1 Age Score: 1 Gender Score: 0       ASSESSMENT AND PLAN: 1. Persistent Atrial Fibrillation (ICD10:  I48.19) The patient's CHA2DS2-VASc score is 2, indicating a 2.2% annual risk of stroke.   S/p dofetilide loading 9/15-9/18/20. Patient appears to be maintaining SR.  He will need ECG and labs at each follow up visit for dofetilide.  Check bmet/mag today.  Continue dofetilide 500 mcg BID. QT stable.  Continue Xarelto 20 mg daily Continue bisoprolol 2.5 mg daily.  2. Secondary Hypercoagulable State (ICD10:  D68.69) The patient is at significant risk for stroke/thromboembolism based upon his CHA2DS2-VASc Score of 2.  Continue Rivaroxaban (Xarelto).   3. CAD DES to LAD 06/11/17. LHC 11/2020 showed nonobstructive disease. On statin and ASA. No anginal symptoms.   Follow up with Dr Tresa Endo or APP per recall. AF clinic in one year.    Jorja Loa PA-C [facility] [address] [place], [place] [address] [phone]

## 2022-10-31 ENCOUNTER — Other Ambulatory Visit (HOSPITAL_COMMUNITY): Payer: Self-pay

## 2022-10-31 ENCOUNTER — Encounter (HOSPITAL_BASED_OUTPATIENT_CLINIC_OR_DEPARTMENT_OTHER): Payer: Self-pay | Admitting: Pulmonary Disease

## 2022-10-31 ENCOUNTER — Ambulatory Visit (HOSPITAL_BASED_OUTPATIENT_CLINIC_OR_DEPARTMENT_OTHER): Payer: Medicare Other | Admitting: Pulmonary Disease

## 2022-10-31 VITALS — BP 136/70 | HR 56 | Temp 97.8°F | Ht 68.0 in | Wt 175.0 lb

## 2022-10-31 DIAGNOSIS — J4540 Moderate persistent asthma, uncomplicated: Secondary | ICD-10-CM | POA: Diagnosis not present

## 2022-10-31 MED ORDER — BUDESONIDE-FORMOTEROL FUMARATE 160-4.5 MCG/ACT IN AERO: 2.0000 | INHALATION_SPRAY | Freq: Two times a day (BID) | RESPIRATORY_TRACT | 2 refills | Status: DC

## 2022-10-31 NOTE — Patient Instructions (Signed)
Moderate persistent asthma - not controlled, not in exacerbation --STOP Breyna 80 --START Breyna 160-4.5 mcg TWO puffs in the morning and evening. Rinse mouth after use --CONTINUE Albuterol every 4 hours AS NEEDED --Consider pulmonary function tests  Asthma Action Plan Increase Albuterol 2 puffs three times a day for worsening shortness of breath, wheezing and cough. If you symptoms do not improve in 24-48 hours, please our office for evaluation and/or prednisone taper.

## 2022-10-31 NOTE — Progress Notes (Signed)
Subjective:   PATIENT ID: Jose Warner GENDER: male DOB: May 20, 1950, MRN: [id number]  Chief Complaint  Patient presents with   Consult    Cough since May 2024.  Productive with white sputum/mucus.  Pt has Afib.  Asthma hx x 10 years.    Reason for Visit: New consult for asthma  Jose Warner is  72 year old never smoker with asthma, CAD s/p stent, atrial fibrillation, HTN, hx pericarditis, GERD, hx prostate cancer who presents for evaluation for asthma.  He was diagnosed with asthma 10 years ago. Denies childhood asthma. Has cough at baseline. He has been on Breyna/Symbicort 80 1 puff twice a day and albuterol 2 puffs twice a day since 2022. Will increase to 2 puffs on Symbicort/Breyna when symptoms worsen, usually in March. Primary symptom is productive cough, followed by shortness of breath and wheezing. Associated with chest tightness. Has had frequent infections with prolonged cough. His last treatment for exacerbation was in April/May 2024. Per communication with Dr. Duaine Dredge, he has been cefdinir, doxcycline and Augmentin. Was then prescribed low dose prednisone 10 mg BID for congestion and cough. He reports that his asthma attacks are usually preceded by sinus infections.  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  Past Medical History:  Diagnosis Date   Anemia    Bronchitis 05-10-12   past fall- 4 runs antibiotics due to bronchitis-uses Pro Air as needed   Celiac disease    Colon polyps    adenomatous   Coronary artery disease    Diverticulosis    GERD (gastroesophageal reflux disease)    Hepatitis 05-10-12   "was told non A, non B"-unclean dental equip.   Hyperlipidemia     Iron deficiency anemia    Pericarditis    Persistent atrial fibrillation (HCC) 06/05/2017   Prostate cancer (HCC) 05-10-12   ;bx. 04-12-12, dx   Spasm of esophagus 2013     Family History  Problem Relation Age of Onset   Stroke Mother    Heart disease Father    Heart attack Father    Cancer Sister    Stroke Maternal Grandfather    Cancer Paternal Grandmother    Heart attack Paternal Grandfather    Heart disease Paternal Grandfather    Colon cancer Neg Hx    Esophageal cancer Neg Hx    Pancreatic cancer Neg Hx    Rectal cancer Neg Hx    Stomach cancer Neg Hx      Social History   Occupational History    Employer: [employer]  Tobacco Use   Smoking status: Never   Smokeless tobacco: Never   Tobacco comments:    Never smoke 10/02/21  Vaping Use   Vaping status: Never Used  Substance and Sexual Activity   Alcohol use: Not Currently    Alcohol/week: 2.0 standard drinks of alcohol    Types: 2 Glasses of wine per week   Drug use: No   Sexual activity: Yes    Partners: Female    Allergies  Allergen Reactions   Doxycycline Hives   Gluten Meal Other (See Comments)    Celiac, stomach crams , gas   Wheat Other (See Comments)    Celiac  Statins     Leg muscle pains - rosuvastatin, atorvastatin, zetia. Tolerates simvastatin   Sulfa Antibiotics Rash     Outpatient Medications Prior to Visit  Medication Sig Dispense Refill   aspirin EC 81 MG tablet Take 1 tablet (81 mg total) by mouth daily. 90 tablet 3   b complex vitamins tablet Take 1 tablet by mouth daily with supper.     bisoprolol (ZEBETA) 5 MG tablet Take 1.7 mg by mouth daily. This is 1/4 tab     dofetilide (TIKOSYN) 500 MCG capsule TAKE 1 CAPSULE TWICE A DAY 180 capsule 1   ezetimibe (ZETIA) 10 MG tablet Take 10 mg by mouth every evening.      fluticasone (FLONASE) 50 MCG/ACT nasal spray Place 1 spray into both nostrils daily.     guaiFENesin (MUCINEX) 600 MG 12 hr tablet Take 600 mg by mouth daily.      Homeopathic Products (ZICAM COLD REMEDY PO) Take 1 tablet by mouth in the morning and at bedtime.     Multiple Vitamin (MULTIVITAMIN WITH MINERALS) TABS tablet Take 1 tablet by mouth daily with supper.     nitroGLYCERIN (NITROSTAT) 0.4 MG SL tablet Place 1 tablet (0.4 mg total) under the tongue every 5 (five) minutes x 3 doses as needed for chest pain. 25 tablet 1   pantoprazole (PROTONIX) 40 MG tablet Take 40 mg by mouth 2 (two) times daily.      pravastatin (PRAVACHOL) 80 MG tablet Take 80 mg by mouth every evening.      PROAIR HFA 108 (90 BASE) MCG/ACT inhaler Take 2 puffs by mouth every 4 (four) hours as needed for wheezing or shortness of breath. For shortness of breath.     rivaroxaban (XARELTO) 20 MG TABS tablet Take 1 tablet (20 mg total) by mouth daily with supper. 90 tablet 2   Vitamin D, Ergocalciferol, 2000 units CAPS Take 2,000 Units by mouth at bedtime.      budesonide-formoterol (SYMBICORT) 80-4.5 MCG/ACT inhaler Inhale 2 puffs into the lungs in the morning and at bedtime.     amLODipine (NORVASC) 2.5 MG tablet Take 1 tablet (2.5 mg total) by mouth daily. 90 tablet 3   No facility-administered medications prior to visit.    Review of Systems  Constitutional:  Negative for chills, diaphoresis, fever, malaise/fatigue and weight loss.  HENT:  Positive for congestion.   Respiratory:  Positive for cough. Negative for hemoptysis, sputum production, shortness of breath and wheezing.   Cardiovascular:  Negative for chest pain, palpitations and leg swelling.     Objective:   Vitals:   10/31/22 0827  BP: 136/70  Pulse: (!) 56  Temp: 97.8 F (36.6 C)  TempSrc: Oral  SpO2: 98%  Weight: 175 lb (79.4 kg)  Height: 5\' 8"  (1.727 m)   SpO2: 98 %  Physical Exam: General: Well-appearing, no acute distress HENT: [place], AT Eyes: EOMI, no scleral icterus Respiratory: Clear to auscultation bilaterally.  No crackles, wheezing or rales Cardiovascular: RRR, -M/R/G, no  JVD Extremities:-Edema,-tenderness Neuro: AAO x4, CNII-XII grossly intact Psych: Normal mood, normal affect  Data Reviewed:  Imaging: CT Chest 04/29/18 - Minimal apical scarring. Minimal bibasilar atelectasis CXR 09/16/22 - No acute infiltrate effusion or edema  PFT: None on file  Labs: CBC    Component Value Date/Time   WBC 5.6 12/12/2020 0201   RBC 4.78 12/12/2020 0201   HGB 13.7 12/12/2020 0201   HCT 42.5 12/12/2020 0201   PLT 221 12/12/2020 0201   MCV  88.9 12/12/2020 0201   MCH 28.7 12/12/2020 0201   MCHC 32.2 12/12/2020 0201   RDW 13.4 12/12/2020 0201   LYMPHSABS 1.9 12/11/2020 1200   MONOABS 0.8 12/11/2020 1200   EOSABS 0.2 12/11/2020 1200   BASOSABS 0.1 12/11/2020 1200   Absolute eos 12/11/20 - 200     Assessment & Plan:   Discussion: 72 year old never smoker with asthma, CAD s/p stent, atrial fibrillation, HTN, hx pericarditis, GERD, hx prostate cancer who presents for evaluation for asthma. Discussed clinical course and management ofasthma including bronchodilator regimen, preventive care and action plan for exacerbation. Pharmacy price investigation: Jerral Ralph copay is $38.21, Advair HFA copay is $99.20.   Moderate persistent asthma - not controlled, not in exacerbation --STOP Breyna 80 --START Breyna 160-4.5 mcg TWO puffs in the morning and evening. Rinse mouth after use --CONTINUE Albuterol every 4 hours AS NEEDED --Consider pulmonary function tests  Asthma Action Plan Increase Albuterol 2 puffs three times a day for worsening shortness of breath, wheezing and cough. If you symptoms do not improve in 24-48 hours, please our office for evaluation and/or prednisone taper.  >Does not tolerate high doses of prednisone. Consider 20-30 mg dosing  Health Maintenance Immunization History  Administered Date(s) Administered   Fluad Quad(high Dose 65+) 01/07/2019   CT Lung Screen - never smoker. Not qualified  No orders of the defined types were placed in this  encounter.  Meds ordered this encounter  Medications   budesonide-formoterol (BREYNA) 160-4.5 MCG/ACT inhaler    Sig: Inhale 2 puffs into the lungs 2 (two) times daily.    Dispense:  10.2 each    Refill:  2    Return end of September.  I have spent a total time of 45-minutes on the day of the appointment reviewing prior documentation, coordinating care and discussing medical diagnosis and plan with the patient/family. Imaging, labs and tests included in this note have been reviewed and interpreted independently by me.  Trenita Hulme Mechele Collin, MD [facility] 10/31/2022 9:08 AM  Office Number [phone]

## 2022-11-06 ENCOUNTER — Other Ambulatory Visit (HOSPITAL_COMMUNITY): Payer: Self-pay | Admitting: Physician Assistant

## 2022-12-02 ENCOUNTER — Institutional Professional Consult (permissible substitution): Payer: Medicare Other | Admitting: Internal Medicine

## 2022-12-19 ENCOUNTER — Other Ambulatory Visit (HOSPITAL_COMMUNITY): Payer: Self-pay | Admitting: Physician Assistant

## 2023-01-05 ENCOUNTER — Other Ambulatory Visit: Payer: Self-pay | Admitting: Family Medicine

## 2023-01-05 ENCOUNTER — Ambulatory Visit
Admission: RE | Admit: 2023-01-05 | Discharge: 2023-01-05 | Disposition: A | Discharge status: Home / Self Care | Payer: Medicare Other | Source: Ambulatory Visit | Attending: Family Medicine | Admitting: Family Medicine

## 2023-01-05 DIAGNOSIS — M5440 Lumbago with sciatica, unspecified side: Secondary | ICD-10-CM

## 2023-01-14 ENCOUNTER — Encounter (HOSPITAL_BASED_OUTPATIENT_CLINIC_OR_DEPARTMENT_OTHER): Payer: Self-pay | Admitting: Pulmonary Disease

## 2023-01-14 ENCOUNTER — Ambulatory Visit (HOSPITAL_BASED_OUTPATIENT_CLINIC_OR_DEPARTMENT_OTHER): Payer: Medicare Other | Admitting: Pulmonary Disease

## 2023-01-14 VITALS — BP 118/60 | HR 69 | Resp 16 | Ht 68.0 in | Wt 183.7 lb

## 2023-01-14 DIAGNOSIS — J4540 Moderate persistent asthma, uncomplicated: Secondary | ICD-10-CM

## 2023-01-14 MED ORDER — BUDESONIDE-FORMOTEROL FUMARATE 160-4.5 MCG/ACT IN AERO: 2.0000 | INHALATION_SPRAY | Freq: Two times a day (BID) | RESPIRATORY_TRACT | 2 refills | Status: DC

## 2023-01-14 NOTE — Progress Notes (Signed)
Subjective:   PATIENT ID: Jose Warner GENDER: male DOB: 1951/02/11, MRN: [id number]  Chief Complaint  Patient presents with   Follow-up    Asthma- breathing is much better    Reason for Visit: F/u asthma  Mr. Jose Warner is  72 year old never smoker with asthma, CAD s/p stent, atrial fibrillation, HTN, hx pericarditis, GERD, hx prostate cancer who presents for evaluation for asthma.  Initial consult He was diagnosed with asthma 10 years ago. Denies childhood asthma. Has cough at baseline. He has been on Breyna/Symbicort 80 1 puff twice a day and albuterol 2 puffs twice a day since 2022. Will increase to 2 puffs on Symbicort/Breyna when symptoms worsen, usually in March. Primary symptom is productive cough, followed by shortness of breath and wheezing. Associated with chest tightness. Has had frequent infections with prolonged cough. His last treatment for exacerbation was in April/May 2024. Per communication with Dr. Duaine Dredge, he has been cefdinir, doxcycline and Augmentin. Was then prescribed low dose prednisone 10 mg BID for congestion and cough. He reports that his asthma attacks are usually preceded by sinus infections.  01/14/23 Since our last visit we increased the [facility] and has felt it has made a difference. He is less short of breath. Able to do more activities without stopping. Denies wheezing. Continues to clear his throat. Unchanged chest tightness.  He reports that his asthma attacks are usually preceded by sinus infections and has not had any sinus infections since then.  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   Hyperlipidemia    Iron deficiency anemia    Pericarditis    Persistent atrial fibrillation (HCC) 06/05/2017   Prostate cancer (HCC) 05-10-12   ;bx. 04-12-12, dx   Spasm of esophagus 2013     Family History  Problem Relation Age of Onset   Stroke Mother    Heart disease Father    Heart attack Father    Cancer Sister    Stroke Maternal Grandfather    Cancer Paternal Grandmother    Heart attack Paternal Grandfather    Heart disease Paternal Grandfather    Colon cancer Neg Hx    Esophageal cancer Neg Hx    Pancreatic cancer Neg Hx    Rectal cancer Neg Hx    Stomach cancer Neg Hx      Social History   Occupational History    Employer: [employer]  Tobacco Use   Smoking status: Never    Passive exposure: Never   Smokeless tobacco: Never   Tobacco comments:    Never smoke 10/02/21  Vaping Use   Vaping status: Never Used  Substance and Sexual Activity   Alcohol use: Not Currently    Alcohol/week: 2.0 standard drinks of alcohol    Types: 2 Glasses of wine per week   Drug use: No  Sexual activity: Yes    Partners: Female    Allergies  Allergen Reactions   Doxycycline Hives   Gluten Meal Other (See Comments)    Celiac, stomach crams , gas   Wheat Other (See Comments)    Celiac    Statins     Leg muscle pains - rosuvastatin, atorvastatin, zetia. Tolerates simvastatin   Sulfa Antibiotics Rash     Outpatient Medications Prior to Visit  Medication Sig Dispense Refill   aspirin EC 81 MG tablet Take 1 tablet (81 mg total) by mouth daily. 90 tablet 3   b complex vitamins tablet Take 1 tablet by mouth daily with supper.     bisoprolol (ZEBETA) 5 MG tablet Take 1.7 mg by mouth daily. This is 1/4 tab     dofetilide (TIKOSYN) 500 MCG capsule TAKE 1 CAPSULE  TWICE A DAY 180 capsule 2   ezetimibe (ZETIA) 10 MG tablet Take 10 mg by mouth every evening.      fluticasone (FLONASE) 50 MCG/ACT nasal spray Place 1 spray into both nostrils daily.     Homeopathic Products (ZICAM COLD REMEDY PO) Take 1 tablet by mouth in the morning and at bedtime.     Multiple Vitamin (MULTIVITAMIN WITH MINERALS) TABS tablet Take 1 tablet by mouth daily with supper.     nitroGLYCERIN (NITROSTAT) 0.4 MG SL tablet Place 1 tablet (0.4 mg total) under the tongue every 5 (five) minutes x 3 doses as needed for chest pain. 25 tablet 1   pantoprazole (PROTONIX) 40 MG tablet Take 40 mg by mouth 2 (two) times daily.      pravastatin (PRAVACHOL) 80 MG tablet Take 80 mg by mouth every evening.      PROAIR HFA 108 (90 BASE) MCG/ACT inhaler Take 2 puffs by mouth every 4 (four) hours as needed for wheezing or shortness of breath. For shortness of breath.     Vitamin D, Ergocalciferol, 2000 units CAPS Take 2,000 Units by mouth at bedtime.      XARELTO 20 MG TABS tablet TAKE 1 TABLET DAILY WITH SUPPER 90 tablet 3   budesonide-formoterol (BREYNA) 160-4.5 MCG/ACT inhaler Inhale 2 puffs into the lungs 2 (two) times daily. 10.2 each 2   amLODipine (NORVASC) 2.5 MG tablet Take 1 tablet (2.5 mg total) by mouth daily. 90 tablet 3   guaiFENesin (MUCINEX) 600 MG 12 hr tablet Take 600 mg by mouth daily.     No facility-administered medications prior to visit.    Review of Systems  Constitutional:  Negative for chills, diaphoresis, fever, malaise/fatigue and weight loss.  HENT:  Negative for congestion.   Respiratory:  Positive for cough and shortness of breath. Negative for hemoptysis, sputum production and wheezing.   Cardiovascular:  Negative for chest pain, palpitations and leg swelling.     Objective:   Vitals:   01/14/23 1332  BP: 118/60  Pulse: 69  Resp: 16  SpO2: 96%  Weight: 183 lb 11.2 oz (83.3 kg)  Height: 5\' 8"  (1.727 m)   SpO2: 96 %  Physical Exam: General:  Well-appearing, no acute distress HENT: [place], AT Eyes: EOMI, no scleral icterus Respiratory: Clear to auscultation bilaterally.  No crackles, wheezing or rales Cardiovascular: RRR, -M/R/G, no JVD Extremities:-Edema,-tenderness Neuro: AAO x4, CNII-XII grossly intact Psych: Normal mood, normal affect  Data Reviewed:  Imaging: CT Chest 04/29/18 - Minimal apical scarring. Minimal bibasilar atelectasis CXR 09/16/22 - No acute infiltrate effusion or edema  PFT: None on file  Labs: CBC  Component Value Date/Time   WBC 5.6 12/12/2020 0201   RBC 4.78 12/12/2020 0201   HGB 13.7 12/12/2020 0201   HCT 42.5 12/12/2020 0201   PLT 221 12/12/2020 0201   MCV 88.9 12/12/2020 0201   MCH 28.7 12/12/2020 0201   MCHC 32.2 12/12/2020 0201   RDW 13.4 12/12/2020 0201   LYMPHSABS 1.9 12/11/2020 1200   MONOABS 0.8 12/11/2020 1200   EOSABS 0.2 12/11/2020 1200   BASOSABS 0.1 12/11/2020 1200   Absolute eos 12/11/20 - 200     Assessment & Plan:   Discussion: 72 year old never smoker with asthma, CAD s/p stent, atrial fibrillation, HTN, hx pericarditis, GERD, hx prostate cancer who presents for asthma. Improved on increased Breyna dose. Discussed clinical course and management of asthma including bronchodilator regimen, preventive care and action plan for exacerbation. Pharmacy price investigation: Jerral Ralph copay is $38.21, Advair HFA copay is $99.20.   Moderate persistent asthma - improved control, not in exacerbation ---CONTINUE Breyna 160-4.5 mcg TWO puffs in the morning and evening. Rinse mouth after use --CONTINUE Albuterol every 4 hours AS NEEDED --Consider pulmonary function tests  Asthma Action Plan Increase Albuterol 2 puffs three times a day for worsening shortness of breath, wheezing and cough. If you symptoms do not improve in 24-48 hours, please our office for evaluation and/or prednisone taper.  >Does not tolerate high doses of prednisone. Consider 20-30 mg dosing  Health  Maintenance Immunization History  Administered Date(s) Administered   Fluad Quad(high Dose 65+) 01/07/2019   CT Lung Screen - never smoker. Not qualified  No orders of the defined types were placed in this encounter.  Meds ordered this encounter  Medications   budesonide-formoterol (BREYNA) 160-4.5 MCG/ACT inhaler    Sig: Inhale 2 puffs into the lungs 2 (two) times daily.    Dispense:  30.6 g    Refill:  2    Dispense 3 inhalers each time    No follow-ups on file.  I have spent a total time of 32-minutes on the day of the appointment including chart review, data review, collecting history, coordinating care and discussing medical diagnosis and plan with the patient/family. Past medical history, allergies, medications were reviewed. Pertinent imaging, labs and tests included in this note have been reviewed and interpreted independently by me.  Khushboo Chuck Mechele Collin, MD [facility] 01/14/2023 2:08 PM  Office Number [phone]

## 2023-01-14 NOTE — Patient Instructions (Signed)
Moderate persistent asthma - improved control, not in exacerbation ---CONTINUE Breyna 160-4.5 mcg TWO puffs in the morning and evening. Rinse mouth after use --CONTINUE Albuterol every 4 hours AS NEEDED --Consider pulmonary function tests  Asthma Action Plan Increase Albuterol 2 puffs three times a day for worsening shortness of breath, wheezing and cough. If you symptoms do not improve in 24-48 hours, please our office for evaluation and/or prednisone taper.

## 2023-01-17 ENCOUNTER — Emergency Department (HOSPITAL_COMMUNITY)
Admission: EM | Admit: 2023-01-17 | Discharge: 2023-01-17 | Disposition: A | Discharge status: Home / Self Care | Payer: Medicare Other | Attending: Emergency Medicine | Admitting: Emergency Medicine

## 2023-01-17 ENCOUNTER — Emergency Department (HOSPITAL_COMMUNITY): Payer: Medicare Other

## 2023-01-17 DIAGNOSIS — Z79899 Other long term (current) drug therapy: Secondary | ICD-10-CM | POA: Insufficient documentation

## 2023-01-17 DIAGNOSIS — Z7901 Long term (current) use of anticoagulants: Secondary | ICD-10-CM | POA: Diagnosis not present

## 2023-01-17 DIAGNOSIS — J45909 Unspecified asthma, uncomplicated: Secondary | ICD-10-CM | POA: Diagnosis not present

## 2023-01-17 DIAGNOSIS — I10 Essential (primary) hypertension: Secondary | ICD-10-CM | POA: Insufficient documentation

## 2023-01-17 DIAGNOSIS — R079 Chest pain, unspecified: Secondary | ICD-10-CM

## 2023-01-17 DIAGNOSIS — I25119 Atherosclerotic heart disease of native coronary artery with unspecified angina pectoris: Secondary | ICD-10-CM | POA: Diagnosis not present

## 2023-01-17 DIAGNOSIS — I209 Angina pectoris, unspecified: Secondary | ICD-10-CM

## 2023-01-17 DIAGNOSIS — Z7982 Long term (current) use of aspirin: Secondary | ICD-10-CM | POA: Diagnosis not present

## 2023-01-17 DIAGNOSIS — Z7951 Long term (current) use of inhaled steroids: Secondary | ICD-10-CM | POA: Diagnosis not present

## 2023-01-17 LAB — TROPONIN I (HIGH SENSITIVITY)
Troponin I (High Sensitivity): 5 ng/L (ref <18)
Troponin I (High Sensitivity): 7 ng/L (ref <18)

## 2023-01-17 LAB — BASIC METABOLIC PANEL
Anion gap: 11 (ref 5–15)
BUN: 19 mg/dL (ref 8–23)
CO2: 20 mmol/L — ABNORMAL LOW (ref 22–32)
Calcium: 8.7 mg/dL — ABNORMAL LOW (ref 8.9–10.3)
Chloride: 105 mmol/L (ref 98–111)
Creatinine, Ser: 1.13 mg/dL (ref 0.61–1.24)
GFR, Estimated: >60 mL/min (ref >60)
Glucose, Bld: 105 mg/dL — ABNORMAL HIGH (ref 70–99)
Potassium: 4 mmol/L (ref 3.5–5.1)
Sodium: 136 mmol/L (ref 135–145)

## 2023-01-17 LAB — CBC
HCT: 40.7 % (ref 39.0–52.0)
Hemoglobin: 13.3 g/dL (ref 13.0–17.0)
MCH: 27.9 pg (ref 26.0–34.0)
MCHC: 32.7 g/dL (ref 30.0–36.0)
MCV: 85.5 fL (ref 80.0–100.0)
Platelets: 244 10*3/uL (ref 150–400)
RBC: 4.76 MIL/uL (ref 4.22–5.81)
RDW: 14 % (ref 11.5–15.5)
WBC: 8.9 10*3/uL (ref 4.0–10.5)
nRBC: 0 % (ref 0.0–0.2)

## 2023-01-17 MED ORDER — NITROGLYCERIN 0.4 MG SL SUBL: 0.4000 mg | SUBLINGUAL_TABLET | SUBLINGUAL | Status: DC | PRN

## 2023-01-17 MED ORDER — NITROGLYCERIN 2 % TD OINT: 1.0000 [in_us] | TOPICAL_OINTMENT | Freq: Once | TRANSDERMAL | Status: DC

## 2023-01-17 NOTE — ED Provider Notes (Signed)
[place] EMERGENCY DEPARTMENT AT [facility] Provider Note   CSN: [id number] Arrival date & time: 01/17/23  1100     History  No chief complaint on file.   Jose Warner is a 72 y.o. male.  HPI    72 year old never smoker with asthma, CAD s/p stent, atrial fibrillation, HTN, pericarditis and GERD.  Patient comes in with chief complaint of chest pain that started at 9:30 AM when patient was still sitting.  The pain is described as heaviness.  It started on the left side but then shifted to the middle of the chest.  Pain is constant, nonradiating, intensity is waxing and waning between mild to moderately severe.  Patient also reports associated diaphoresis on 1 occasion and having some shortness of breath type feeling.  He does have a history of GERD, but the current symptoms are not consistent to it.  Patient has previous history of angina.  This pain is different, and more typical of the pain he had when he required stent.  He has taken 3 nitroglycerin so far and the pain did ease off a little bit with it.  Patient has already taken full dose aspirin prior to ED arrival.  Home Medications Prior to Admission medications   Medication Sig Start Date End Date Taking? Authorizing Provider  amLODipine (NORVASC) 2.5 MG tablet Take 1 tablet (2.5 mg total) by mouth daily. 08/31/17 10/02/22  Lennette Bihari, MD  aspirin EC 81 MG tablet Take 1 tablet (81 mg total) by mouth daily. 01/19/18   Lennette Bihari, MD  b complex vitamins tablet Take 1 tablet by mouth daily with supper.    [provider]  bisoprolol (ZEBETA) 5 MG tablet Take 1.7 mg by mouth daily. This is 1/4 tab    [provider]  budesonide-formoterol (BREYNA) 160-4.5 MCG/ACT inhaler Inhale 2 puffs into the lungs 2 (two) times daily. 01/14/23   Luciano Cutter, MD  dofetilide (TIKOSYN) 500 MCG capsule TAKE 1 CAPSULE TWICE A DAY 11/06/22   Fenton, Clint R, PA  ezetimibe (ZETIA) 10 MG tablet Take 10 mg by  mouth every evening.     [provider]  fluticasone (FLONASE) 50 MCG/ACT nasal spray Place 1 spray into both nostrils daily.    [provider]  Homeopathic Products [facility] COLD REMEDY PO) Take 1 tablet by mouth in the morning and at bedtime.    [provider]  Multiple Vitamin (MULTIVITAMIN WITH MINERALS) TABS tablet Take 1 tablet by mouth daily with supper.    [provider]  nitroGLYCERIN (NITROSTAT) 0.4 MG SL tablet Place 1 tablet (0.4 mg total) under the tongue every 5 (five) minutes x 3 doses as needed for chest pain. 08/01/22   Lennette Bihari, MD  pantoprazole (PROTONIX) 40 MG tablet Take 40 mg by mouth 2 (two) times daily.  06/24/17   [provider]  pravastatin (PRAVACHOL) 80 MG tablet Take 80 mg by mouth every evening.     [provider]  PROAIR HFA 108 (90 BASE) MCG/ACT inhaler Take 2 puffs by mouth every 4 (four) hours as needed for wheezing or shortness of breath. For shortness of breath. 02/13/12   [provider]  Vitamin D, Ergocalciferol, 2000 units CAPS Take 2,000 Units by mouth at bedtime.     [provider]  XARELTO 20 MG TABS tablet TAKE 1 TABLET DAILY WITH SUPPER 12/19/22   Fenton, [place] R, PA      Allergies  Doxycycline, Gluten meal, Wheat, Statins, and Sulfa antibiotics    Review of Systems   Review of Systems  All other systems reviewed and are negative.   Physical Exam Updated Vital Signs BP 122/67   Pulse (!) 54   Temp 98.7 F (37.1 C) (Oral)   Resp 17   SpO2 96%  Physical Exam Vitals and nursing note reviewed.  Constitutional:      Appearance: He is well-developed.  HENT:     Head: Atraumatic.  Eyes:     Extraocular Movements: Extraocular movements intact.     Pupils: Pupils are equal, round, and reactive to light.  Cardiovascular:     Rate and Rhythm: Normal rate.  Pulmonary:     Effort: Pulmonary effort is normal.  Musculoskeletal:        General: No swelling.      Cervical back: Neck supple.     Right lower leg: No edema.     Left lower leg: No edema.  Skin:    General: Skin is warm.  Neurological:     Mental Status: He is alert and oriented to person, place, and time.     ED Results / Procedures / Treatments   Labs (all labs ordered are listed, but only abnormal results are displayed) Labs Reviewed  BASIC METABOLIC PANEL - Abnormal; Notable for the following components:      Result Value   CO2 20 (*)    Glucose, Bld 105 (*)    Calcium 8.7 (*)    All other components within normal limits  CBC  TROPONIN I (HIGH SENSITIVITY)  TROPONIN I (HIGH SENSITIVITY)    EKG EKG Interpretation Date/Time:  Saturday January 17 2023 11:22:08 EDT Ventricular Rate:  52 PR Interval:  164 QRS Duration:  100 QT Interval:  528 QTC Calculation: 492 R Axis:   10  Text Interpretation: Sinus rhythm Abnormal R-wave progression, early transition Borderline prolonged QT interval No acute changes No significant change since last tracing Confirmed by Derwood Kaplan ([id number]) on 01/17/2023 11:33:18 AM  Radiology DG Chest Port 1 View  Result Date: 01/17/2023 CLINICAL DATA:  Chest pain EXAM: PORTABLE CHEST - 1 VIEW COMPARISON:  09/16/2022 FINDINGS: Relatively low lung volumes with some bronchovascular crowding at the lung bases. No overt edema. No pneumothorax. Heart size and mediastinal contours are within normal limits. No effusion. Visualized bones unremarkable. IMPRESSION: No acute cardiopulmonary disease. Electronically Signed   By: Corlis Leak M.D.   On: 01/17/2023 12:59    Procedures Procedures    Medications Ordered in ED Medications - No data to display  ED Course/ Medical Decision Making/ A&P Clinical Course as of 01/17/23 1505  Sat Jan 17, 2023  1400 Patient reassessed.  Patient indicates that now he is chest pain-free.  His chest pain just resolved about 30 minutes prior to my assessment.  Second troponin pending.  He is made aware that cardiology  team will see him and disposition will be based on their recommendations. [AN]  1502 Troponin I (High Sensitivity): 7 Repeat troponin is normal.  Cardiology team has seen the patient and recommend discharge if both troponins are normal.  Patient comfortable with this plan. [AN]    Clinical Course User Index [AN] Derwood Kaplan, MD             HEART Score: 6                    Medical Decision Making Amount and/or Complexity of Data Reviewed Labs:  ordered. Radiology: ordered.  Risk Prescription drug management.  This patient presents to the ED with chief complaint(s) of chest pain with pertinent past medical history of CAD with LAD stent.The complaint involves an extensive differential diagnosis and also carries with it a high risk of complications and morbidity.    The differential diagnosis considered for this patient includes  ACS syndrome Aortic dissection Myocarditis Pericarditis Endocarditis Pleural effusion / Pulmonary edema PE Pneumothorax Musculoskeletal pain PUD / Gastritis / Esophagitis Esophageal spasm  The initial plan is to get basic labs, chest x-ray.  Initial EKG is reassuring.  We have ordered repeat EKG given ongoing chest pain.   Additional history obtained: Records reviewed previous admission documents and previous cath from 2022 which revealed non-occlusive CAD.  Independent labs interpretation:  The following labs were independently interpreted: Delta troponin are flat and below institutional cutoff for myocardial injury.  Independent visualization and interpretation of imaging: - I independently visualized the following imaging with scope of interpretation limited to determining acute life threatening conditions related to emergency care: X-ray of the chest, which revealed no pneumothorax  Treatment and Reassessment: Patient reassessed after the first troponin.  He is made aware that cardiology team will see him.  Patient currently chest  pain-free.  Consultation: - Consulted or discussed management/test interpretation with external professional: Cardiology team, they recommend discharge.  Patient's second troponin is normal.  If patient remains chest pain-free, then we can discharge him.  Hear score is 6.  Outpatient cardiology follow-up provided. Final Clinical Impression(s) / ED Diagnoses Final diagnoses:  Angina pectoris (HCC)    Rx / DC Orders ED Discharge Orders          Ordered    Ambulatory referral to Cardiology       Comments: If you have not heard from the Cardiology office within the next 72 hours please call [phone].   01/17/23 1501              Derwood Kaplan, MD 01/17/23 1505

## 2023-01-17 NOTE — ED Triage Notes (Signed)
Pt bib ems from home c/o CP that is sharp with pressure. Pt says pain doesn't change with palpation. Pain started at 09:40 Pt has hx of blockages and afib. Pt took 2 nitroglycerin prior to ems arrival and received 2 nitroglycerin and 324 mg aspirin.   BP 130/70 3L Nasal Canula 99% RR 24

## 2023-01-17 NOTE — Consult Note (Addendum)
Cardiology Consultation   Patient ID: Jose Warner MRN: [id number]; DOB: 04-07-1951  Admit date: 01/17/2023 Date of Consult: 01/17/2023  PCP:  Mosetta Putt, MD   [facility] Providers Cardiologist:  Nicki Guadalajara, MD        Patient Profile:   Jose Warner is a 72 y.o. male with a hx of pAF 2014 on AC,  unstable angina/mid LAD PCI for 95% lesion, recurrent CP with non obstructive disease on LHC and patent stent, normal EF,  idiopathic pericarditis, asthma, candida esophagitis, celiac disease, GERD, prostate cancer in remission, esophageal spasm  who is being seen 01/17/2023 for the evaluation of CP at the request of Dr. Rhunette Croft.  History of Present Illness:   Jose Warner presents with sudden onset of chest pain that began on the left side as a sharp pain near the lower rib and moved to the center of his chest, resolving within an hour. This started at 9:30 AM this morning. He denies prior symptoms to today. He states this pain is similar to his episode of ACS.  Patient is HDS on RA.  Rhythm is sinus bradycardia.  Kidney fxn is normal. Troponin is negative. Cxray is unremarkable.  He has hx of ACS with 95% mid LAD lesion s/p PCI. He has had recurrent CP/LHC showing non obstructive CAD and a patent stent. Last admitted in 11/2020. He has hx of pAF post prostate sx in 2019. He is a patient of Dr. Tresa Endo.   Past Medical History:  Diagnosis Date   Anemia    Bronchitis 05-10-12   past fall- 4 runs antibiotics due to bronchitis-uses Pro Air as needed   Celiac disease    Colon polyps    adenomatous   Coronary artery disease    Diverticulosis    GERD (gastroesophageal reflux disease)    Hepatitis 05-10-12   "was told non A, non B"-unclean dental equip.   Hyperlipidemia    Iron deficiency anemia    Pericarditis    Persistent atrial fibrillation (HCC) 06/05/2017   Prostate cancer (HCC) 05-10-12   ;bx. 04-12-12, dx   Spasm of esophagus 2013    Past Surgical  History:  Procedure Laterality Date   CARDIAC CATHETERIZATION  09/13/2004   LAD:30%-40% in prox. to mid segment with 20% in the mid segment and 20% in left Circ., mild 20% right common iliac narrowing. medical therapy   CARDIOVERSION  06/05/2017   CARDIOVERSION N/A 11/11/2018   Procedure: CARDIOVERSION;  Surgeon: Chrystie Nose, MD;  Location: [facility] ENDOSCOPY;  Service: Cardiovascular;  Laterality: N/A;   CORONARY STENT INTERVENTION N/A 06/11/2017   Procedure: CORONARY STENT INTERVENTION;  Surgeon: Lyn Records, MD;  Location: [facility] INVASIVE CV LAB;  Service: Cardiovascular;  Laterality: N/A;   HERNIA REPAIR  5 yrs ago   right    LEFT HEART CATH AND CORONARY ANGIOGRAPHY N/A 06/11/2017   Procedure: LEFT HEART CATH AND CORONARY ANGIOGRAPHY;  Surgeon: Lyn Records, MD;  Location: [facility] INVASIVE CV LAB;  Service: Cardiovascular;  Laterality: N/A;   LEFT HEART CATH AND CORONARY ANGIOGRAPHY N/A 08/18/2017   Procedure: LEFT HEART CATH AND CORONARY ANGIOGRAPHY;  Surgeon: [place], Peter M, MD;  Location: [facility] INVASIVE CV LAB;  Service: Cardiovascular;  Laterality: N/A;   LEFT HEART CATH AND CORONARY ANGIOGRAPHY N/A 12/12/2020   Procedure: LEFT HEART CATH AND CORONARY ANGIOGRAPHY;  Surgeon: Lennette Bihari, MD;  Location: [facility] INVASIVE CV LAB;  Service: Cardiovascular;  Laterality: N/A;   NM MYOCAR PERF WALL  MOTION  11/30/2007   protocol:Bruce, post EF 67%,mild perfusion defect seen in basal inferior consistant with Attenuation artifact, exercise cap.   ROBOT ASSISTED LAPAROSCOPIC RADICAL PROSTATECTOMY  05/20/2012   Procedure: ROBOTIC ASSISTED LAPAROSCOPIC RADICAL PROSTATECTOMY LEVEL 1;  Surgeon: Crecencio Mc, MD;  Location: [facility] ORS;  Service: Urology;  Laterality: N/A;      TRANSURETHRAL RESECTION OF PROSTATE  24yrs ago     Home Medications:  Prior to Admission medications   Medication Sig Start Date End Date Taking? Authorizing Provider  amLODipine (NORVASC) 2.5 MG tablet Take 1 tablet (2.5 mg total) by  mouth daily. 08/31/17 10/02/22  Lennette Bihari, MD  aspirin EC 81 MG tablet Take 1 tablet (81 mg total) by mouth daily. 01/19/18   Lennette Bihari, MD  b complex vitamins tablet Take 1 tablet by mouth daily with supper.    [provider]  bisoprolol (ZEBETA) 5 MG tablet Take 1.7 mg by mouth daily. This is 1/4 tab    [provider]  budesonide-formoterol (BREYNA) 160-4.5 MCG/ACT inhaler Inhale 2 puffs into the lungs 2 (two) times daily. 01/14/23   Luciano Cutter, MD  dofetilide (TIKOSYN) 500 MCG capsule TAKE 1 CAPSULE TWICE A DAY 11/06/22   Fenton, Clint R, PA  ezetimibe (ZETIA) 10 MG tablet Take 10 mg by mouth every evening.     [provider]  fluticasone (FLONASE) 50 MCG/ACT nasal spray Place 1 spray into both nostrils daily.    [provider]  [facility] COLD REMEDY PO) Take 1 tablet by mouth in the morning and at bedtime.    [provider]  Multiple Vitamin (MULTIVITAMIN WITH MINERALS) TABS tablet Take 1 tablet by mouth daily with supper.    [provider]  nitroGLYCERIN (NITROSTAT) 0.4 MG SL tablet Place 1 tablet (0.4 mg total) under the tongue every 5 (five) minutes x 3 doses as needed for chest pain. 08/01/22   Lennette Bihari, MD  pantoprazole (PROTONIX) 40 MG tablet Take 40 mg by mouth 2 (two) times daily.  06/24/17   [provider]  pravastatin (PRAVACHOL) 80 MG tablet Take 80 mg by mouth every evening.     [provider]  PROAIR HFA 108 (90 BASE) MCG/ACT inhaler Take 2 puffs by mouth every 4 (four) hours as needed for wheezing or shortness of breath. For shortness of breath. 02/13/12   [provider]  Vitamin D, Ergocalciferol, 2000 units CAPS Take 2,000 Units by mouth at bedtime.     [provider]  XARELTO 20 MG TABS tablet TAKE 1 TABLET DAILY WITH SUPPER 12/19/22   Fenton, [place] R, PA    Inpatient Medications: Scheduled Meds:  Continuous Infusions:  PRN  Meds:   Allergies:    Allergies  Allergen Reactions   Doxycycline Hives   Gluten Meal Other (See Comments)    Celiac, stomach crams , gas   Wheat Other (See Comments)    Celiac    Statins     Leg muscle pains - rosuvastatin, atorvastatin, zetia. Tolerates simvastatin   Sulfa Antibiotics Rash    Social History:   Social History   Socioeconomic History   Marital status: Married    Spouse name: Not on file   Number of children: 3   Years of education: Not on file   Highest education level: Not on file  Occupational History    Employer: [employer]  Tobacco Use   Smoking status: Never    Passive exposure:  Never   Smokeless tobacco: Never   Tobacco comments:    Never smoke 10/02/21  Vaping Use   Vaping status: Never Used  Substance and Sexual Activity   Alcohol use: Not Currently    Alcohol/week: 2.0 standard drinks of alcohol    Types: 2 Glasses of wine per week   Drug use: No   Sexual activity: Yes    Partners: Female  Other Topics Concern   Not on file  Social History Narrative   Not on file   Social Determinants of Health   Financial Resource Strain: Not on file  Food Insecurity: Unknown (06/10/2017)   Hunger Vital Sign    Worried About Running Out of Food in the Last Year: Patient declined    Barista in the Last Year: Patient declined  Transportation Needs: Unknown (06/10/2017)   PRAPARE - Administrator, Civil Service (Medical): Patient declined    Lack of Transportation (Non-Medical): Patient declined  Physical Activity: Not on file  Stress: Not on file  Social Connections: Unknown (06/10/2017)   Social Connection and Isolation Panel [NHANES]    Frequency of Communication with Friends and Family: Patient declined    Frequency of Social Gatherings with Friends and Family: Patient declined    Attends Religious Services: Patient declined    Database administrator or Organizations: Patient declined    Attends Banker Meetings:  Patient declined    Marital Status: Patient declined  Intimate Partner Violence: Unknown (06/10/2017)   Humiliation, Afraid, Rape, and Kick questionnaire    Fear of Current or Ex-Partner: Patient declined    Emotionally Abused: Patient declined    Physically Abused: Patient declined    Sexually Abused: Patient declined    Family History:    Family History  Problem Relation Age of Onset   Stroke Mother    Heart disease Father    Heart attack Father    Cancer Sister    Stroke Maternal Grandfather    Cancer Paternal Grandmother    Heart attack Paternal Grandfather    Heart disease Paternal Grandfather    Colon cancer Neg Hx    Esophageal cancer Neg Hx    Pancreatic cancer Neg Hx    Rectal cancer Neg Hx    Stomach cancer Neg Hx      ROS:  Please see the history of present illness.   All other ROS reviewed and negative.     Physical Exam/Data:   Vitals:   01/17/23 1230 01/17/23 1300 01/17/23 1330 01/17/23 1400  BP: 130/70 106/69 120/67 122/67  Pulse: (!) 51 60 (!) 56 (!) 54  Resp: (!) 21 19 20 17   Temp:      TempSrc:      SpO2: 98% 97% 97% 96%   No intake or output data in the 24 hours ending 01/17/23 1406    01/14/2023    1:32 PM 10/31/2022    8:27 AM 10/02/2022    8:30 AM  Last 3 Weights  Weight (lbs) 183 lb 11.2 oz 175 lb 173 lb  Weight (kg) 83.326 kg 79.379 kg 78.472 kg     There is no height or weight on file to calculate BMI.  General:  Well nourished, well developed, in no acute distress HEENT: normal Neck: no JVD Vascular: No carotid bruits; Distal pulses 2+ bilaterally Cardiac:  normal S1, S2; RRR; no murmur  Lungs:  clear to auscultation bilaterally, no wheezing, rhonchi or rales  Abd: soft, nontender,  no hepatomegaly  Ext: no edema Musculoskeletal:  No deformities, BUE and BLE strength normal and equal Skin: warm and dry  Neuro:  CNs 2-12 intact, no focal abnormalities noted Psych:  Normal affect   EKG:  The EKG was personally reviewed and  demonstrates:  sinus bradycardia  Telemetry:  Telemetry was personally reviewed and demonstrates:  sinus bradycardia  Relevant CV Studies:   [facility] 12/12/2020    Ost LAD to Prox LAD lesion is 30% stenosed.   Mid LAD to Dist LAD lesion is 30% stenosed.   Previously placed Mid LAD stent (unknown type) is  widely patent.   LV end diastolic pressure is low.   The left ventricular ejection fraction is 55-65% by visual estimate.   Mild nonobstructive residual CAD with mild to moderate proximal to mid LAD calcification with 30% smooth proximal stenosis, widely patent LAD stent, mild 30% smooth stenosis beyond the stented segment; normal left circumflex and normal dominant RCA.   Normal LV function with EF estimated at 55 to 65% without focal segmental wall motion abnormalities.  LVEDP 8 mmHg.   RECOMMENDATION: Suspect nonischemic chest pain.  Patient is currently in sinus rhythm.  Recommend resumption of Xarelto tomorrow.  Probably okay for discharge later today.  Continue aggressive lipid-lowering therapy with target LDL less than 70 and optimal blood pressure control.  TTE 01/07/2019 EF 55-60% Mild LVH E/e' 12 RV fxn and size nl Mild MR No pulmonary HTN   Laboratory Data:  High Sensitivity Troponin:   Recent Labs  Lab 01/17/23 1200  TROPONINIHS 5     Chemistry Recent Labs  Lab 01/17/23 1200  NA 136  K 4.0  CL 105  CO2 20*  GLUCOSE 105*  BUN 19  CREATININE 1.13  CALCIUM 8.7*  GFRNONAA >60  ANIONGAP 11    No results for input(s): "PROT", "ALBUMIN", "AST", "ALT", "ALKPHOS", "BILITOT" in the last 168 hours. Lipids No results for input(s): "CHOL", "TRIG", "HDL", "LABVLDL", "LDLCALC", "CHOLHDL" in the last 168 hours.  Hematology Recent Labs  Lab 01/17/23 1200  WBC 8.9  RBC 4.76  HGB 13.3  HCT 40.7  MCV 85.5  MCH 27.9  MCHC 32.7  RDW 14.0  PLT 244   Thyroid No results for input(s): "TSH", "FREET4" in the last 168 hours.  BNPNo results for input(s): "BNP",  "PROBNP" in the last 168 hours.  DDimer No results for input(s): "DDIMER" in the last 168 hours.   Radiology/Studies:  DG Chest Port 1 View  Result Date: 01/17/2023 CLINICAL DATA:  Chest pain EXAM: PORTABLE CHEST - 1 VIEW COMPARISON:  09/16/2022 FINDINGS: Relatively low lung volumes with some bronchovascular crowding at the lung bases. No overt edema. No pneumothorax. Heart size and mediastinal contours are within normal limits. No effusion. Visualized bones unremarkable. IMPRESSION: No acute cardiopulmonary disease. Electronically Signed   By: Corlis Leak M.D.   On: 01/17/2023 12:59     Assessment and Plan:   CP He has hx of chronic cp. Has had non obstructive CAD. He has hx of esophageal spasm which can contribute. Although, this was not typical of his spasms. He has negative high sensitivity troponins. EKG has no ischemic changes. Since still within about 6 hours, can get another troponin level. If this remains negative, he can go home. We discussed observation overnight, however since the pain has resolved he opts to go home and return if having persistent CP.  pAF -  sinus bradycardia  HTN - well controlled  No changes in his  home medications.    Risk Assessment/Risk Scores:       [facility] if his troponin remains negative, will sign off.   Medication Recommendations:  per above Other recommendations (labs, testing, etc):  none Follow up as an outpatient:  [facility] cardiology APP in 2-4 weeks  For questions or updates, please contact [facility] Please consult [email or web address] for contact info under    Signed, Maisie Fus, MD  01/17/2023 2:06 PM

## 2023-01-17 NOTE — Discharge Instructions (Signed)
We saw you in the ER for the chest pain.  All of our cardiac workup is normal, including labs, EKG and chest X-RAY are normal.  We are not sure what is causing your discomfort, but we feel comfortable sending you home at this time. The workup in the ER is not complete, and you should follow up with the cardiology team for additional workup.  Please return to the ER if you have worsening chest pain, shortness of breath, pain radiating to your jaw, shoulder, or back, sweats or fainting. Otherwise see the Cardiologist or your primary care doctor as requested.

## 2023-01-21 ENCOUNTER — Encounter: Payer: Self-pay | Admitting: Cardiovascular Disease

## 2023-01-21 ENCOUNTER — Ambulatory Visit: Payer: Medicare Other | Attending: Cardiovascular Disease | Admitting: Cardiovascular Disease

## 2023-01-21 DIAGNOSIS — I480 Paroxysmal atrial fibrillation: Secondary | ICD-10-CM | POA: Diagnosis present

## 2023-01-21 DIAGNOSIS — R079 Chest pain, unspecified: Secondary | ICD-10-CM | POA: Insufficient documentation

## 2023-01-21 DIAGNOSIS — J4540 Moderate persistent asthma, uncomplicated: Secondary | ICD-10-CM | POA: Diagnosis present

## 2023-01-21 DIAGNOSIS — D6869 Other thrombophilia: Secondary | ICD-10-CM | POA: Diagnosis present

## 2023-01-21 DIAGNOSIS — E785 Hyperlipidemia, unspecified: Secondary | ICD-10-CM | POA: Diagnosis present

## 2023-01-21 DIAGNOSIS — R0789 Other chest pain: Secondary | ICD-10-CM

## 2023-01-21 DIAGNOSIS — Z9861 Coronary angioplasty status: Secondary | ICD-10-CM | POA: Insufficient documentation

## 2023-01-21 DIAGNOSIS — I2510 Atherosclerotic heart disease of native coronary artery without angina pectoris: Secondary | ICD-10-CM | POA: Diagnosis present

## 2023-01-21 DIAGNOSIS — I25119 Atherosclerotic heart disease of native coronary artery with unspecified angina pectoris: Secondary | ICD-10-CM | POA: Diagnosis present

## 2023-01-21 DIAGNOSIS — Z8679 Personal history of other diseases of the circulatory system: Secondary | ICD-10-CM | POA: Insufficient documentation

## 2023-01-21 NOTE — Patient Instructions (Signed)
Medication Instructions:  No medication changes *If you need a refill on your cardiac medications before your next appointment, please call your pharmacy*   Lab Work: none If you have labs (blood work) drawn today and your tests are completely normal, you will receive your results only by: MyChart Message (if you have MyChart) OR A paper copy in the mail If you have any lab test that is abnormal or we need to change your treatment, we will call you to review the results.   Testing/Procedures: Your physician has requested that you have en exercise stress myoview. For further information please visit [email or web address] Please follow instruction sheet, as given.    Follow-Up: At [facility], you and your health needs are our priority.  As part of our continuing mission to provide you with exceptional heart care, we have created designated Provider Care Teams.  These Care Teams include your primary Cardiologist (physician) and Advanced Practice Providers (APPs -  Physician Assistants and Nurse Practitioners) who all work together to provide you with the care you need, when you need it.  We recommend signing up for the patient portal called "MyChart".  Sign up information is provided on this After Visit Summary.  MyChart is used to connect with patients for Virtual Visits (Telemedicine).  Patients are able to view lab/test results, encounter notes, upcoming appointments, etc.  Non-urgent messages can be sent to your provider as well.   To learn more about what you can do with MyChart, go to [email or web address].    Your next appointment:   7 month(s) or based on the results of the Myoview  Provider:   Nicki Guadalajara, MD

## 2023-01-21 NOTE — Progress Notes (Signed)
Patient ID: Jose Warner, male   DOB: March 21, 1951, 72 y.o.   MRN: [id number]        PCP: Jose Warner   HPI: Jose Warner is a 72 y.o. male who presents for a 21 month follow-up cardiology evaluation.   Jose Warner has a strong family history for premature coronary artery disease. In May 2006 cardiac catheterization showed very mild LAD and circumflex narrowing felt to be not flow limiting at approximately 20-30%. He has a history of low HDL levels. He developed focal prostate cancer and underwent robotic prostatectomy by Dr. Roger Warner in 2014 When I saw him after his prostate surgery in March 2014 he was in atrial fibrillation at which time he was completely unaware of this rhythm disturbance. He was started on eliquis 5 mg twice a day anticoagulation and as well as metoprolol succinate. He subsequently converted spontaneously to sinus rhythm and has been maintaining sinus rhythm since. He remains active. He exercises daily. He denies any chest pain. He denies shortness of breath. He denies palpitations. His echo Doppler study showed an ejection fraction in the 55-65% range. He had  upper normal LA size. There is mild aortic insufficiency. Pulmonary pressures were normal with an estimated RV systolic pressure 15 mm.  In the past, he had been on Niaspan for low HDL levels. This was stopped when he was also put on cialis following his prostate surgery. He no longer takes cialis.  Laboratory done in April 2014 showed cholesterol 131 triglycerides 96 his HDL remains very low at 23 and his LDL was 89. Thyroid function studies were normal as were his hemoglobin hematocrit and chemistries.  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  He denies any episodes of chest pain.  Dr. Duaine Warner has put him back on his simvastatin 40 mg and niacin 1000 mg based on laboratory that he had done.  He continues to take Bystolic 5 mg for hypertension.  He continues to have  difficulty with erectile function following his prostate surgery.  He also was diagnosed with celiac disease and is extremely sensitive to gluten.   He remains active.  He is not aware of any recurrent arrhythmia.  Dr. Duaine Warner checks laboratory and he was told that his labs most recently were excellent.  He is tolerating Bystolic 5 mg.  He is followed by Dr. Laverle Warner for his prostate.  He is on a gluten-free diet.   He was hospitalized on 06/09/2017 after having experienced intermittent chest pain  He was felt to potentially have unstableand stenting of a 95% mid LAD stenosiswithin a diffusely diseasedlcified segmen  There also was mild 40% narrowing proximal and distal to the stent.  He was discharged on 06/12/2017.  He felt vague recurrent chest pain leading to an additional overnight hospital stayon from 324.  Troponins were negative.  ECG was without ischemic changes and he was he was bradycardic with heart rates in the 40s.    When I saw him for follow-up evaluation in March 2019 well and was gradually gaining his strength.  Because of his PAF, he was on Plavix and Xarelto.  He also was on bisoprolol 2.5 mg daily.  They continue to be on niacin and simvastatin per Dr. Duaine Warner and is on Symbicort with a history of asthma.   He developed a different type of chest pain that was lasted tense but had occurred consecutively on 3 days in a row.  He had taken nitroglycerin  with some improvement.  He was rehospitalized on August 17, 2017 and underwent repeat cardiac catheterization August 18, 2017 by [employer].  His LAD stent was patent but he had 30% proximal, 40% mid, and 20% distal LAD stenoses.  He has felt improved.  He was treated for possible bronchitis and was given Z-Pak by Dr. Duaine Warner.    He was hospitalized from September 9 through December 30, 2017 after developing left upper chest, neck pain associated with left arm numbness not responsive to nitroglycerin.  His ECG did not reveal ischemic  changes.  D-dimer was detectable.  He was diagnosed as having acute pericarditis which was confirmed on MRI imaging and he was treated with ibuprofen in addition to colchicine.  CT also demonstrated cervical spondylosis without acute findings.  His symptoms have gradually improved therapy.   I saw him on January 19, 2018.  At that time he was feeling well but still experienced occasional sharp discomfort but denied any classic pleuritic-like symptoms.  He completed a 60-month course of colchicine for his pericarditis.  He represented to the emergency room on November 1 with recurrent AF and was seen in follow-up in the A. fib clinic with Jose Warner.  He was not cardioverted in the ER due to his ongoing treatment for pericarditis and he continued to be on bisoprolol 5 mg daily in addition to Xarelto.  He saw Dr. Johney Warner in follow-up March 08, 2018 and during that evaluation it was noted that he had converted on his own back to sinus rhythm.   When I last saw him in March 2020 he denied any recurrent awareness of atrial fibrillation.  Jose Warner is is primary care physician who checks his laboratory.  When I saw him he was without recurrent pleuritic chest pain.    He developed recurrent atrial fibrillation in July 2020 and underwent successful DC cardioversion on November 11, 2018.  Unfortunately, he developed recurrent atrial fibrillation and was therefore admitted to the hospital for Tikosyn loading on September 15 through January 07, 2019.  He converted to sinus rhythm on the medication and did not require repeat DC cardioversion.  I saw him in follow-up in October 2020.  At that time he was unaware of recurrent episodes of atrial fibrillation since Tikosyn initiation.  He continued to be on anticoagulation therapy.  He denied any recurrent pleuritic symptoms.  He continued to be on pravastatin 80 mg for hyperlipidemia in addition to niacin per Dr. Duaine Warner apparently due to particle size.  He  also has been on bisoprolol 5 mg in the morning and 2.5 mg at night as well as amlodipine 2.5 mg daily.  He denies any recurrent anginal symptoms.    I last saw him on March 28, 2020.  He has been followed in the atrial fibrillation clinic and also sees Dr. Duaine Warner.  He believes he may have had atrial fibrillation for one day when he was on prednisone therapy and antibiotics for sinus infection.  He was last evaluated in A. fib clinic in March 19, 2020.  Presently, he feels well.  He denies any chest pain or shortness of breath.  He continues to be on amlodipine 2.5 mg daily, bisoprolol 2.5 mg, for hypertension.  He is on Tikosyn 500 mg twice a day for his atrial fibrillation.  He continues to be on Symbicort and as needed albuterol for asthma.  He is on Xarelto 20 mg daily for anticoagulation.  He continues to be on Zetia 10  mg, pravastatin 80 mg, and slow release niacin 1000 mg daily for mixed hyperlipidemia.   Since I last saw him, he has continued to be seen in the atrial fibrillation clinic was evaluated by Alphonzo Severance on September 24, 2020.  He developed recurrent chest pain in August and presented to [facility] in December 11, 2020 for central chest pain and worsening shortness of breath.  He underwent repeat cardiac catheterization which demonstrated patency of his LAD stent and mild plaque without significant stenoses in other vessels.  A CT angiogram of his chest did not show acute abnormality.  His Xarelto has been held for the procedure and was resumed following evening.  Possible GI evaluation was recommended.  He was seen by Azalee Course, PA in follow-up of his hospitalization and remained stable.  I last saw him on May 16, 2021 at which time he felt well and denied any chest pain or shortness of breath.  He had not experienced any recurrent pericarditis and was no longer on colchicine.  He continued  to be on amlodipine 2.5 mg and bisoprolol 2.5 mg daily.  He is on pravastatin 80 mg in  addition to Zetia 10 mg.  He continues to take Xarelto 20 mg.  He is now on Protonix 40 mg twice a day.  He has not had any recurrent atrial fibrillation on dofetilide 500 mg twice a day.  He presents for reevaluation.  Since I last saw him, he has been evaluated by Edd Fabian in April 2024 as well as Alphonzo Severance in [place] clinic in June 2024.  He was maintaining sinus rhythm may continue to be on dofetilide 500 mg twice a day with stable QT interval, Xarelto 20 mg daily, and bisoprolol 2.5 mg.  He was not having any anginal symptomatology.  Mr. Labelle was recently evaluated by Dr. Everardo All of pulmonary with his history of asthma and apparently was stable on Breyna 160/4.5 mcg 2 puffs in the morning in addition to as needed albuterol.  2 days ago, Mr. Merita Norton presented  to the [facility] emergency room on January 17, 2023 after he had developed some nonexertional chest discomfort at 9:30 AM while he was sitting.  The pain initially started on his left side of his chest and back but then shifted to the midline of the chest.  It was constant nonradiating and not exertionally precipitated.  He took 3 sublingual nitroglycerin with questionable minimal benefit.  ECG and chest x-ray were normal.  Troponins were normal.  His chest pain ultimately abated and has not returned.  He continues to be on amlodipine 2.5 mg, bisoprolol and apparently takes one quarter of a bisoprolol 5 mg tablet.  He is on dofetilide 500 mg twice a day.  He is anticoagulated with Xarelto 20 mg.  He is on pravastatin 80 mg for hyperlipidemia in addition to Zetia 10 mg and takes pantoprazole for GERD.  He presents for evaluation.  Past Medical History:  Diagnosis Date   Anemia    Bronchitis 05-10-12   past fall- 4 runs antibiotics due to bronchitis-uses Pro Air as needed   Celiac disease    Colon polyps    adenomatous   Coronary artery disease    Diverticulosis    GERD (gastroesophageal reflux disease)    Hepatitis 05-10-12   "was told  non A, non B"-unclean dental equip.   Hyperlipidemia    Iron deficiency anemia    Pericarditis    Persistent atrial fibrillation (HCC) 06/05/2017   Prostate  cancer (HCC) 05-10-12   ;bx. 04-12-12, dx   Spasm of esophagus 2013    Past Surgical History:  Procedure Laterality Date   CARDIAC CATHETERIZATION  09/13/2004   LAD:30%-40% in prox. to mid segment with 20% in the mid segment and 20% in left Circ., mild 20% right common iliac narrowing. medical therapy   CARDIOVERSION  06/05/2017   CARDIOVERSION N/A 11/11/2018   Procedure: CARDIOVERSION;  Surgeon: Chrystie Nose, MD;  Location: [facility] ENDOSCOPY;  Service: Cardiovascular;  Laterality: N/A;   CORONARY STENT INTERVENTION N/A 06/11/2017   Procedure: CORONARY STENT INTERVENTION;  Surgeon: Lyn Records, MD;  Location[facility] INVASIVE CV LAB;  Service: Cardiovascular;  Laterality: N/A;   HERNIA REPAIR  5 yrs ago   right    LEFT HEART CATH AND CORONARY ANGIOGRAPHY N/A 06/11/2017   Procedure: LEFT HEART CATH AND CORONARY ANGIOGRAPHY;  Surgeon: Lyn Records, MD;  Location: [facility] INVASIVE CV LAB;  Service: Cardiovascular;  Laterality: N/A;   LEFT HEART CATH AND CORONARY ANGIOGRAPHY N/A 08/18/2017   Procedure: LEFT HEART CATH AND CORONARY ANGIOGRAPHY;  Surgeon: Warner, Peter M, MD;  Location: [facility] INVASIVE CV LAB;  Service: Cardiovascular;  Laterality: N/A;   LEFT HEART CATH AND CORONARY ANGIOGRAPHY N/A 12/12/2020   Procedure: LEFT HEART CATH AND CORONARY ANGIOGRAPHY;  Surgeon: Lennette Bihari, MD;  Location: [facility] INVASIVE CV LAB;  Service: Cardiovascular;  Laterality: N/A;   NM MYOCAR PERF WALL MOTION  11/30/2007   protocol:Bruce, post EF 67%,mild perfusion defect seen in basal inferior consistant with Attenuation artifact, exercise cap.   ROBOT ASSISTED LAPAROSCOPIC RADICAL PROSTATECTOMY  05/20/2012   Procedure: ROBOTIC ASSISTED LAPAROSCOPIC RADICAL PROSTATECTOMY LEVEL 1;  Surgeon: Crecencio Mc, MD;  Location: [facility] ORS;  Service: Urology;  Laterality: N/A;       TRANSURETHRAL RESECTION OF PROSTATE  107yrs ago    Allergies  Allergen Reactions   Doxycycline Hives   Gluten Meal Other (See Comments)    Celiac, stomach crams , gas   Wheat Other (See Comments)    Celiac    Statins     Leg muscle pains - rosuvastatin, atorvastatin, zetia. Tolerates simvastatin   Sulfa Antibiotics Rash    Current Outpatient Medications  Medication Sig Dispense Refill   aspirin EC 81 MG tablet Take 1 tablet (81 mg total) by mouth daily. 90 tablet 3   b complex vitamins tablet Take 1 tablet by mouth daily with supper.     bisoprolol (ZEBETA) 5 MG tablet Take 1.7 mg by mouth daily. This is 1/4 tab     budesonide-formoterol (BREYNA) 160-4.5 MCG/ACT inhaler Inhale 2 puffs into the lungs 2 (two) times daily. 30.6 g 2   dofetilide (TIKOSYN) 500 MCG capsule TAKE 1 CAPSULE TWICE A DAY 180 capsule 2   ezetimibe (ZETIA) 10 MG tablet Take 10 mg by mouth every evening.      Homeopathic Products (ZICAM COLD REMEDY PO) Take 1 tablet by mouth in the morning and at bedtime.     Multiple Vitamin (MULTIVITAMIN WITH MINERALS) TABS tablet Take 1 tablet by mouth daily with supper.     nitroGLYCERIN (NITROSTAT) 0.4 MG SL tablet Place 1 tablet (0.4 mg total) under the tongue every 5 (five) minutes x 3 doses as needed for chest pain. 25 tablet 1   pantoprazole (PROTONIX) 40 MG tablet Take 40 mg by mouth 2 (two) times daily.      pravastatin (PRAVACHOL) 80 MG tablet Take 80 mg by mouth every evening.  PROAIR HFA 108 (90 BASE) MCG/ACT inhaler Take 2 puffs by mouth every 4 (four) hours as needed for wheezing or shortness of breath. For shortness of breath.     Vitamin D, Ergocalciferol, 2000 units CAPS Take 2,000 Units by mouth at bedtime.      XARELTO 20 MG TABS tablet TAKE 1 TABLET DAILY WITH SUPPER 90 tablet 3   amLODipine (NORVASC) 2.5 MG tablet Take 1 tablet (2.5 mg total) by mouth daily. 90 tablet 3   fluticasone (FLONASE) 50 MCG/ACT nasal spray Place 1 spray into both nostrils  daily. (Patient not taking: Reported on 01/21/2023)     No current facility-administered medications for this visit.    Social History   Socioeconomic History   Marital status: Married    Spouse name: Not on file   Number of children: 3   Years of education: Not on file   Highest education level: Not on file  Occupational History    Employer: [employer]  Tobacco Use   Smoking status: Never    Passive exposure: Never   Smokeless tobacco: Never   Tobacco comments:    Never smoke 10/02/21  Vaping Use   Vaping status: Never Used  Substance and Sexual Activity   Alcohol use: Not Currently    Alcohol/week: 2.0 standard drinks of alcohol    Types: 2 Glasses of wine per week   Drug use: No   Sexual activity: Yes    Partners: Female  Other Topics Concern   Not on file  Social History Narrative   Not on file   Social Determinants of Health   Financial Resource Strain: Not on file  Food Insecurity: Unknown (06/10/2017)   Hunger Vital Sign    Worried About Running Out of Food in the Last Year: Patient declined    Barista in the Last Year: Patient declined  Transportation Needs: Unknown (06/10/2017)   PRAPARE - Administrator, Civil Service (Medical): Patient declined    Lack of Transportation (Non-Medical): Patient declined  Physical Activity: Not on file  Stress: Not on file  Social Connections: Unknown (06/10/2017)   Social Connection and Isolation Panel [NHANES]    Frequency of Communication with Friends and Family: Patient declined    Frequency of Social Gatherings with Friends and Family: Patient declined    Attends Religious Services: Patient declined    Database administrator or Organizations: Patient declined    Attends Banker Meetings: Patient declined    Marital Status: Patient declined  Intimate Partner Violence: Unknown (06/10/2017)   Humiliation, Afraid, Rape, and Kick questionnaire    Fear of Current or Ex-Partner: Patient declined     Emotionally Abused: Patient declined    Physically Abused: Patient declined    Sexually Abused: Patient declined    Socially, he is a PhD and Is now retired from [employer].  He had been in the turf business and floral arrangements.  He is married.  He has 3 sons who are successful in their work.  1 son is a pediatric liver transplant doctor at [employer] [place].  ROS General: Negative; No fevers, chills, or night sweats;  HEENT: Negative; No changes in vision or hearing, sinus congestion, difficulty swallowing Pulmonary: Negative; No cough, wheezing, shortness of breath, hemoptysis Cardiovascular: see HPI GI: Positive for celiac disease GU: Positive for erectile dysfunction since his robotic prostatic surgery No dysuria, hematuria, or difficulty voiding Musculoskeletal: Negative; no myalgias, joint pain, or weakness Hematologic/Oncology: Negative; no  easy bruising, bleeding Endocrine: Negative; no heat/cold intolerance; no diabetes Neuro: Negative; no changes in balance, headaches Skin: Negative; No rashes or skin lesions Psychiatric: Negative; No behavioral problems, depression Sleep: Negative; No snoring, daytime sleepiness, hypersomnolence, bruxism, restless legs, hypnogognic hallucinations, no cataplexy Other comprehensive 14 point system review is negative.   Physical examination BP 124/72   Pulse (!) 55   Ht 5\' 8"  (1.727 m)   Wt 183 lb (83 kg)   SpO2 98%   BMI 27.83 kg/m     Repeat blood pressure by me was 122/70  Wt Readings from Last 3 Encounters:  01/21/23 183 lb (83 kg)  01/14/23 183 lb 11.2 oz (83.3 kg)  10/31/22 175 lb (79.4 kg)   General: Alert, oriented, no distress.  Skin: normal turgor, no rashes, warm and dry HEENT: Normocephalic, atraumatic. Pupils equal round and reactive to light; sclera anicteric; extraocular muscles intact;  Nose without nasal septal hypertrophy Mouth/Parynx benign; Mallinpatti scale 3 Neck: No JVD, no carotid bruits; normal  carotid upstroke Lungs: clear to ausculatation and percussion; no wheezing or rales Chest wall: without tenderness to palpitation Heart: PMI not displaced, RRR, s1 s2 normal, 1/6 systolic murmur, no diastolic murmur, no rubs, gallops, thrills, or heaves Abdomen: soft, nontender; no hepatosplenomehaly, BS+; abdominal aorta nontender and not dilated by palpation. Back: no CVA tenderness Pulses 2+ Musculoskeletal: full range of motion, normal strength, no joint deformities Extremities: no clubbing cyanosis or edema, Homan's sign negative  Neurologic: grossly nonfocal; Cranial nerves grossly wnl Psychologic: Normal mood and affect     EKG Interpretation Date/Time:  Wednesday January 21 2023 10:57:47 EDT Ventricular Rate:  55 PR Interval:  172 QRS Duration:  92 QT Interval:  496 QTC Calculation: 474 R Axis:   -8  Text Interpretation: Sinus bradycardia When compared with ECG of 17-Jan-2023 11:51, PREVIOUS ECG IS PRESENT Confirmed by Nicki Guadalajara ([id number]) on 01/21/2023 11:52:56 AM     May 16, 2021 ECG (independently read by me): SInus bradycardia at 53, QTc 456 msec  March 28, 2020 ECG (independently read by me): NSR at 61; no ectopy; QTc 493  October 2020 ECG (independently read by me): NSR at 61; no ectopy: QTc 473 msec  March 2019 ECG (independently read by me): Sinus bradycardia 54 bpm.  No ectopy.  Normal intervals.  October 2019 ECG (independently read by me): Sinus bradycardia at 46 bpm.  No ectopy.  Normal intervals.  No ST segment abnormalities  May 2019 ECG (independently read by me): Normal sinus rhythm at 66 bpm.normal sinus rhythm at 66 bpm.  No ectopy.  Normal intervals.  No ST segment depression. No ectopy.  Normal intervals.  No ST segment depression.  July 02, 2017 ECG (independently read by me): sinus bradycardia 51 bpm.  No ST segment changes.  Normal intervals.  November 2018ECG (independently read by me): Sinus bradycardia 56 bpm.  No ectopy.  Normal  intervals.  October 2017 ECG (independently read by me): Sinus bradycardia 59 bpm.  Normal intervals.  No ectopy.  May 2015 ECG (and apparently read by me): Normal sinus rhythm at 60 beats per minute.  Early transition.  No ectopy.  PR interval 174 ms, QTc interval 460 ms.  ECG: (independently read by me): Sinus rhythm at 56 beats per minute. No ectopy. Normal intervals.  LABS:     Latest Ref Rng & Units 01/17/2023   12:00 PM 10/02/2022    9:07 AM 10/02/2021    1:44 PM  BMP  Glucose 70 -  99 mg/dL 161  096  045   BUN 8 - 23 mg/dL 19  10  14    Creatinine 0.61 - 1.24 mg/dL 4.09  8.11  9.14   Sodium 135 - 145 mmol/L 136  140  137   Potassium 3.5 - 5.1 mmol/L 4.0  4.4  4.6   Chloride 98 - 111 mmol/L 105  107  108   CO2 22 - 32 mmol/L 20  22  23    Calcium 8.9 - 10.3 mg/dL 8.7  9.2  8.7     Hepatic Function Panel     Component Value Date/Time   PROT 7.0 12/11/2020 1200   ALBUMIN 3.9 12/11/2020 1200   AST 26 12/11/2020 1200   ALT 31 12/11/2020 1200   ALKPHOS 55 12/11/2020 1200   BILITOT 0.8 12/11/2020 1200   BILIDIR 0.2 06/12/2017 0804   IBILI 0.5 06/12/2017 0804        Latest Ref Rng & Units 01/17/2023   12:00 PM 12/12/2020    2:01 AM 12/11/2020   12:36 PM  CBC  WBC 4.0 - 10.5 K/uL 8.9  5.6    Hemoglobin 13.0 - 17.0 g/dL 78.2  95.6  21.3   Hematocrit 39.0 - 52.0 % 40.7  42.5  44.0   Platelets 150 - 400 K/uL 244  221      BNP No results found for: "PROBNP"  Lipid Panel     Component Value Date/Time   CHOL 147 12/12/2020 0201   TRIG 150 (H) 12/12/2020 0201   HDL 40 (L) 12/12/2020 0201   CHOLHDL 3.7 12/12/2020 0201   VLDL 30 12/12/2020 0201   LDLCALC 77 12/12/2020 0201     RADIOLOGY: No results found.  IMPRESSION:  1. History of CAD involving native coronary artery of native heart with angina pectoris (HCC)   2. CAD S/P percutaneous coronary angioplasty   3. Chest pain, unspecified type   4. Paroxysmal atrial fibrillation (HCC)   5. Secondary  hypercoagulable state (HCC)   6. Hyperlipidemia LDL goal <70   7. Moderate persistent asthma without complication   8. History of pericarditis     ASSESSMENT AND PLAN: Mr. Molinelli Is a 72 year-old gentleman who had undergone cardiac catheterization in May 2006 and was found to have mild nonobstructive CAD.  He has a history of hyperlipidemia with very low HDL levels and remotely was on niacin in addition to his statin which previously was simvastatin 40 mg and now he is on pravastatin 80 mg daily followed by Dr. Duaine Warner.   He developed atrial fibrillation for which he was unaware following his robotic prostatectomy for his prostate cancer.  He was hospitalized in February 2019 with intermittent chest pain.  Due to worrisome symptoms of unstable angina, catheterization was performed and revealed a 95% mid LAD stenosis in a calcified segment and mild additional concomitant CAD.  Initially he received triple therapy but ultimately aspirin was discontinued and he was maintained on Plavix and anticoagulation.Marland Kitchen  He developed repeat chest pain leading to repeat catheterization on August 18, 2017.  This chest pain was less intense character but was somewhat nitrate responsive.  He also took a Z-Pak for potential treatment of bronchitis.  He was diagnosed with acute pericarditis confirmed by MRI imaging September 2019. He was on Plavix in addition to Xarelto for his anticoagulation with his recent use of nonsteroidal anti-inflammatory medication and treatment with colchicine.  When I saw him in follow-up since it had been over 6 months since his  stent placement I recommended he discontinue Plavix but resume a baby aspirin 81 mg and continue Xarelto.  He was on colchicine for at least 3 months duration.  Over time ultimately his pericarditis symptomatology ultimately resolved.  Unfortunately he continued experiences with atrial fibrillation and has required repeat cardioversion and due to recurrence was  hospitalized  and underwent successful Tikosyn loading at 500 mg twice a day with pharmacologic conversion back to sinus rhythm.  Since Tikosyn initiation, he admits to only 1 brief episode of atrial fibrillation which occurred in the setting while he was on prednisone in addition to antibiotics for sinus infection.  He developed recurrent chest pain symptomatology and underwent repeat catheterization in August 2022 which revealed a patent stent and mild concomitant CAD.  Presently, he has been maintaining sinus rhythm on Tikosyn and apparently is taking 1/4 tablet of bisoprolol 5 mg.  QTc interval today is 474.  He recently had developed left sided pain in the region of the latissimus dorsi which then progressed to the center of the chest.  This led to his ER evaluation several days ago.  Troponins were negative.  ECG was normal.  His chest pain ultimately resolved and has not recurred.  His chest pain was nonexertional.  His ECG today is stable.  Blood pressure is normal.  I do not believe his chest pain was ischemic in etiology.  It certainly possible this could have been esophageal spasm.  However, with his coronary history, I have recommended he undergo an exercise Myoview study for further evaluation of myocardial perfusion.  He continues to see Dr. Lynnette Caffey  who checks laboratory.  I will contact him regarding the results of his nuclear study.  I will see him in 6 to 7 months for follow-up evaluation or sooner as needed.   Lennette Bihari, MD, [facility]  01/23/2023  6:01 PM

## 2023-01-23 ENCOUNTER — Encounter: Payer: Self-pay | Admitting: Cardiovascular Disease

## 2023-01-28 ENCOUNTER — Telehealth (HOSPITAL_COMMUNITY): Payer: Self-pay | Admitting: *Deleted

## 2023-01-28 NOTE — Telephone Encounter (Signed)
Patient given detailed instructions per Myocardial Perfusion Study Information Sheet for the test on 02/02/2023 at 10:00 a.m. Patient notified to arrive 15 minutes early and that it is imperative to arrive on time for appointment to keep from having the test rescheduled.  If you need to cancel or reschedule your appointment, please call the office within 24 hours of your appointment. . Patient verbalized understanding.Daneil Dolin

## 2023-02-02 ENCOUNTER — Ambulatory Visit (HOSPITAL_COMMUNITY): Payer: Medicare Other | Attending: Cardiovascular Disease

## 2023-02-02 DIAGNOSIS — R079 Chest pain, unspecified: Secondary | ICD-10-CM | POA: Diagnosis not present

## 2023-02-02 DIAGNOSIS — I25119 Atherosclerotic heart disease of native coronary artery with unspecified angina pectoris: Secondary | ICD-10-CM | POA: Insufficient documentation

## 2023-02-02 LAB — MYOCARDIAL PERFUSION IMAGING
Angina Index: 0
Duke Treadmill Score: 8
Estimated workload: 10.1
Exercise duration (min): 8 min
Exercise duration (sec): 16 s
LV dias vol: 74 mL (ref 62–150)
LV sys vol: 33 mL
MPHR: 149 {beats}/min
Nuc Stress EF: 56 %
Peak HR: 129 {beats}/min
Percent HR: 86 %
Rest HR: 52 {beats}/min
Rest Nuclear Isotope Dose: 10.4 mCi
SDS: 0
SRS: 0
SSS: 0
ST Depression (mm): 0 mm
Stress Nuclear Isotope Dose: 31.5 mCi
TID: 0.88

## 2023-02-02 MED ORDER — TECHNETIUM TC 99M TETROFOSMIN IV KIT
10.4000 | PACK | Freq: Once | INTRAVENOUS | Status: AC | PRN
  Administered 2023-02-02: 10.4 via INTRAVENOUS

## 2023-02-02 MED ORDER — TECHNETIUM TC 99M TETROFOSMIN IV KIT
31.5000 | PACK | Freq: Once | INTRAVENOUS | Status: AC | PRN
  Administered 2023-02-02: 31.5 via INTRAVENOUS

## 2023-02-11 ENCOUNTER — Telehealth: Payer: Self-pay | Admitting: Cardiovascular Disease

## 2023-02-11 NOTE — Telephone Encounter (Signed)
Follow Up:     Patient is returning a call from today, concerning his test results.

## 2023-02-11 NOTE — Telephone Encounter (Signed)
Below message relayed to patient. No questions st this time.     Normal exercise myocardial perfusion study with the patient exercising to a peak heart rate of 129.  Normal perfusion.  Normal EF at 56%

## 2023-06-18 ENCOUNTER — Encounter (HOSPITAL_BASED_OUTPATIENT_CLINIC_OR_DEPARTMENT_OTHER): Payer: Self-pay | Admitting: Pulmonary Disease

## 2023-06-18 NOTE — Telephone Encounter (Signed)
Can you schedule

## 2023-07-13 ENCOUNTER — Encounter: Payer: Self-pay | Admitting: Cardiovascular Disease

## 2023-07-13 ENCOUNTER — Ambulatory Visit: Payer: Medicare Other | Attending: Cardiovascular Disease | Admitting: Cardiovascular Disease

## 2023-07-13 DIAGNOSIS — Z9861 Coronary angioplasty status: Secondary | ICD-10-CM | POA: Diagnosis present

## 2023-07-13 DIAGNOSIS — Z8679 Personal history of other diseases of the circulatory system: Secondary | ICD-10-CM | POA: Diagnosis present

## 2023-07-13 DIAGNOSIS — I2510 Atherosclerotic heart disease of native coronary artery without angina pectoris: Secondary | ICD-10-CM | POA: Insufficient documentation

## 2023-07-13 DIAGNOSIS — I25119 Atherosclerotic heart disease of native coronary artery with unspecified angina pectoris: Secondary | ICD-10-CM | POA: Diagnosis present

## 2023-07-13 DIAGNOSIS — D6869 Other thrombophilia: Secondary | ICD-10-CM | POA: Insufficient documentation

## 2023-07-13 DIAGNOSIS — E785 Hyperlipidemia, unspecified: Secondary | ICD-10-CM | POA: Diagnosis present

## 2023-07-13 DIAGNOSIS — J4540 Moderate persistent asthma, uncomplicated: Secondary | ICD-10-CM | POA: Diagnosis present

## 2023-07-13 DIAGNOSIS — I480 Paroxysmal atrial fibrillation: Secondary | ICD-10-CM | POA: Insufficient documentation

## 2023-07-13 NOTE — Patient Instructions (Signed)
 Medication Instructions:  No medication changes were made during today's visit.  *If you need a refill on your cardiac medications before your next appointment, please call your pharmacy*   Lab Work: No labs were ordered during today's visit.  If you have labs (blood work) drawn today and your tests are completely normal, you will receive your results only by: MyChart Message (if you have MyChart) OR A paper copy in the mail If you have any lab test that is abnormal or we need to change your treatment, we will call you to review the results.   Testing/Procedures: No procedures were ordered during today's visit.    Follow-Up: At [facility], you and your health needs are our priority.  As part of our continuing mission to provide you with exceptional heart care, we have created designated Provider Care Teams.  These Care Teams include your primary Cardiologist (physician) and Advanced Practice Providers (APPs -  Physician Assistants and Nurse Practitioners) who all work together to provide you with the care you need, when you need it.  We recommend signing up for the patient portal called "MyChart".  Sign up information is provided on this After Visit Summary.  MyChart is used to connect with patients for Virtual Visits (Telemedicine).  Patients are able to view lab/test results, encounter notes, upcoming appointments, etc.  Non-urgent messages can be sent to your provider as well.   To learn more about what you can do with MyChart, go to [email or web address].    Your next appointment:   6-8 month(s)  Provider:   Dr, Thurmon Fair     Other Instructions [facility]  [address] [place], [place] [address] OPENING [address] [phone]       1st Floor: - Lobby - Registration  - Pharmacy  - Lab - Cafe   2nd Floor: - PV Lab - Diagnostic Testing (echo, CT, nuclear med)   3rd Floor: - Vacant   4th Floor: - TCTS (cardiothoracic surgery) -  [facility] - Structural Heart Clinic - Vascular Surgery  - Vascular Ultrasound   5th Floor: - [facility] (general and EP) - Clinical Pharmacy for coumadin, hypertension, lipid, weight-loss medications, and med management appointments      Valet parking services will be available as well.       Thank you for choosing [facility]!

## 2023-07-17 ENCOUNTER — Encounter: Payer: Self-pay | Admitting: Cardiovascular Disease

## 2023-07-17 NOTE — Progress Notes (Signed)
 Patient ID: Jose Warner, male   DOB: 1950/05/24, 73 y.o.   MRN: [id number]        PCP: Dr. Mosetta Warner   HPI: Jose Warner is a 73 y.o. male who presents for a 5 month follow-up cardiology evaluation.   Jose Warner has a strong family history for premature coronary artery disease. In May 2006 cardiac catheterization showed very mild LAD and circumflex narrowing felt to be not flow limiting at approximately 20-30%. He has a history of low HDL levels. He developed focal prostate cancer and underwent robotic prostatectomy by Dr. Roger Warner in 2014 When I saw him after his prostate surgery in March 2014 he was in atrial fibrillation at which time he was completely unaware of this rhythm disturbance. He was started on eliquis 5 mg twice a day anticoagulation and as well as metoprolol succinate. He subsequently converted spontaneously to sinus rhythm and has been maintaining sinus rhythm since. He remains active. He exercises daily. He denies any chest pain. He denies shortness of breath. He denies palpitations. His echo Doppler study showed an ejection fraction in the 55-65% range. He had  upper normal LA size. There is mild aortic insufficiency. Pulmonary pressures were normal with an estimated RV systolic pressure 15 mm.  In the past, he had been on Niaspan for low HDL levels. This was stopped when he was also put on cialis following his prostate surgery. He no longer takes cialis.  Laboratory done in April 2014 showed cholesterol 131 triglycerides 96 his HDL remains very low at 23 and his LDL was 89. Thyroid function studies were normal as were his hemoglobin hematocrit and chemistries.  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  He denies any episodes of chest pain.  Jose Warner has put him back on his simvastatin 40 mg and niacin 1000 mg based on laboratory that he had done.  He continues to take Bystolic 5 mg for hypertension.  He continues to have  difficulty with erectile function following his prostate surgery.  He also was diagnosed with celiac disease and is extremely sensitive to gluten.   He remains active.  He is not aware of any recurrent arrhythmia.  Jose Warner checks laboratory and he was told that his labs most recently were excellent.  He is tolerating Bystolic 5 mg.  He is followed by Dr. Laverle Warner for his prostate.  He is on a gluten-free diet.   He was hospitalized on 06/09/2017 after having experienced intermittent chest pain  He was felt to potentially have unstableand stenting of a 95% mid LAD stenosiswithin a diffusely diseasedlcified segmen  There also was mild 40% narrowing proximal and distal to the stent.  He was discharged on 06/12/2017.  He felt vague recurrent chest pain leading to an additional overnight hospital stayon from 324.  Troponins were negative.  ECG was without ischemic changes and he was he was bradycardic with heart rates in the 40s.    When I saw him for follow-up evaluation in March 2019 well and was gradually gaining his strength.  Because of his PAF, he was on Plavix and Xarelto.  He also was on bisoprolol 2.5 mg daily.  They continue to be on niacin and simvastatin per Jose Warner and is on Symbicort with a history of asthma.   He developed a different type of chest pain that had occurred consecutively on 3 days in a row.  He had taken nitroglycerin with some improvement.  He was rehospitalized on August 17, 2017 and underwent repeat cardiac catheterization August 18, 2017 by Dr. [place].  His LAD stent was patent but he had 30% proximal, 40% mid, and 20% distal LAD stenoses.  He has felt improved.  He was treated for possible bronchitis and was given Z-Pak by Jose Warner.    He was hospitalized from September 9 through December 30, 2017 after developing left upper chest, neck pain associated with left arm numbness not responsive to nitroglycerin.  His ECG did not reveal ischemic changes.  D-dimer was  detectable.  He was diagnosed as having acute pericarditis which was confirmed on MRI imaging and he was treated with ibuprofen in addition to colchicine.  CT also demonstrated cervical spondylosis without acute findings.  His symptoms have gradually improved therapy.   I saw him on January 19, 2018.  At that time he was feeling well but still experienced occasional sharp discomfort but denied any classic pleuritic-like symptoms.  He completed a 30-month course of colchicine for his pericarditis.  He represented to the emergency room on November 1 with recurrent AF and was seen in follow-up in the A. fib clinic with Jose Warner.  He was not cardioverted in the ER due to his ongoing treatment for pericarditis and he continued to be on bisoprolol 5 mg daily in addition to Xarelto.  He saw Dr. Johney Warner in follow-up March 08, 2018 and during that evaluation it was noted that he had converted on his own back to sinus rhythm.   When I last saw him in March 2020 he denied any recurrent awareness of atrial fibrillation.  Dr. Durwin Warner is is primary care physician who checks his laboratory.  When I saw him he was without recurrent pleuritic chest pain.    He developed recurrent atrial fibrillation in July 2020 and underwent successful DC cardioversion on November 11, 2018.  Unfortunately, he developed recurrent atrial fibrillation and was therefore admitted to the hospital for Tikosyn loading on September 15 through January 07, 2019.  He converted to sinus rhythm on the medication and did not require repeat DC cardioversion.  I saw him in follow-up in October 2020.  At that time he was unaware of recurrent episodes of atrial fibrillation since Tikosyn initiation.  He continued to be on anticoagulation therapy.  He denied any recurrent pleuritic symptoms.  He continued to be on pravastatin 80 mg for hyperlipidemia in addition to niacin per Jose Warner apparently due to particle size.  He also has been on  bisoprolol 5 mg in the morning and 2.5 mg at night as well as amlodipine 2.5 mg daily.  He denies any recurrent anginal symptoms.    I saw him on March 28, 2020.  He has been followed in the atrial fibrillation clinic and also sees Jose Warner.  He believes he may have had atrial fibrillation for one day when he was on prednisone therapy and antibiotics for sinus infection.  He was last evaluated in A. fib clinic in March 19, 2020.  Presently, he feels well.  He denies any chest pain or shortness of breath.  He continues to be on amlodipine 2.5 mg daily, bisoprolol 2.5 mg, for hypertension.  He is on Tikosyn 500 mg twice a day for his atrial fibrillation.  He continues to be on Symbicort and as needed albuterol for asthma.  He is on Xarelto 20 mg daily for anticoagulation.  He continues to be on Zetia 10 mg, pravastatin 80 mg, and  slow release niacin 1000 mg daily for mixed hyperlipidemia.   Since I last saw him, he has continued to be seen in the atrial fibrillation clinic was evaluated by Alphonzo Severance on September 24, 2020.  He developed recurrent chest pain in August and presented to [facility] in December 11, 2020 for central chest pain and worsening shortness of breath.  He underwent repeat cardiac catheterization which demonstrated patency of his LAD stent and mild plaque without significant stenoses in other vessels.  A CT angiogram of his chest did not show acute abnormality.  His Xarelto has been held for the procedure and was resumed following evening.  Possible GI evaluation was recommended.  He was seen by Azalee Course, PA in follow-up of his hospitalization and remained stable.  I saw him on May 16, 2021 at which time he felt well and denied any chest pain or shortness of breath.  He had not experienced any recurrent pericarditis and was no longer on colchicine.  He continued  to be on amlodipine 2.5 mg and bisoprolol 2.5 mg daily.  He is on pravastatin 80 mg in addition to Zetia 10 mg.  He  continues to take Xarelto 20 mg.  He is now on Protonix 40 mg twice a day.  He has not had any recurrent atrial fibrillation on dofetilide 500 mg twice a day.  He presents for reevaluation.  He has been evaluated by Edd Fabian in April 2024 as well as Alphonzo Severance in [place] clinic in June 2024.  He was maintaining sinus rhythm may continue to be on dofetilide 500 mg twice a day with stable QT interval, Xarelto 20 mg daily, and bisoprolol 2.5 mg.  He was not having any anginal symptomatology.  I last saw him on January 21, 2023.  Jose Warner was recently evaluated by Dr. Everardo All of pulmonary with his history of asthma and apparently was stable on Breyna 160/4.5 mcg 2 puffs in the morning in addition to as needed albuterol.  2 days ago, Jose Warner presented  to the [facility] emergency room on January 17, 2023 after he had developed some nonexertional chest discomfort at 9:30 AM while he was sitting.  The pain initially started on his left side of his chest and back but then shifted to the midline of the chest.  It was constant nonradiating and not exertionally precipitated.  He took 3 sublingual nitroglycerin with questionable minimal benefit.  ECG and chest x-ray were normal.  Troponins were normal.  His chest pain ultimately abated and has not returned.  He continues to be on amlodipine 2.5 mg, bisoprolol and apparently takes one quarter of a bisoprolol 5 mg tablet.  He is on dofetilide 500 mg twice a day.  He is anticoagulated with Xarelto 20 mg.  He is on pravastatin 80 mg for hyperlipidemia in addition to Zetia 10 mg and takes pantoprazole for GERD.  Although I did not feel his chest pain was ischemic in etiology I recommended he undergo a subsequent nuclear stress test for further evaluation.  His nuclear stress test on February 02, 2023 showed normal myocardial perfusion and he was able exercised to a peak heart rate of 129.  EF was 56%.  Presently, Jose Warner feels well.  He has been retired for  approximately 9 years.  His son who is a physician still works at [employer] of [place] and is a pediatric liver transplant specialist.  Jose Warner is unaware of any recurrent atrial fibrillation and sees Jorja Loa in  A-fib clinic yearly.  He continues to be followed by Dr. Mosetta Warner who checks laboratory regularly.  He continues to be on amlodipine 2.5 mg, bisoprolol which she takes one quarter of a 5 mg tablet, Tikosyn 500 mg twice a day, pravastatin 80 mg daily, Xarelto 20 mg for anticoagulation, and pantoprazole 40 mg for GERD.  He presents for evaluation.   Past Medical History:  Diagnosis Date   Anemia    Bronchitis 05-10-12   past fall- 4 runs antibiotics due to bronchitis-uses Pro Air as needed   Celiac disease    Colon polyps    adenomatous   Coronary artery disease    Diverticulosis    GERD (gastroesophageal reflux disease)    Hepatitis 05-10-12   "was told non A, non B"-unclean dental equip.   Hyperlipidemia    Iron deficiency anemia    Pericarditis    Persistent atrial fibrillation (HCC) 06/05/2017   Prostate cancer (HCC) 05-10-12   ;bx. 04-12-12, dx   Spasm of esophagus 2013    Past Surgical History:  Procedure Laterality Date   CARDIAC CATHETERIZATION  09/13/2004   LAD:30%-40% in prox. to mid segment with 20% in the mid segment and 20% in left Circ., mild 20% right common iliac narrowing. medical therapy   CARDIOVERSION  06/05/2017   CARDIOVERSION N/A 11/11/2018   Procedure: CARDIOVERSION;  Surgeon: Chrystie Nose, MD;  Location: [facility] ENDOSCOPY;  Service: Cardiovascular;  Laterality: N/A;   CORONARY STENT INTERVENTION N/A 06/11/2017   Procedure: CORONARY STENT INTERVENTION;  Surgeon: Lyn Records, MD;  Location: [facility] INVASIVE CV LAB;  Service: Cardiovascular;  Laterality: N/A;   HERNIA REPAIR  5 yrs ago   right    LEFT HEART CATH AND CORONARY ANGIOGRAPHY N/A 06/11/2017   Procedure: LEFT HEART CATH AND CORONARY ANGIOGRAPHY;  Surgeon: Lyn Records, MD;   Location: [facility] INVASIVE CV LAB;  Service: Cardiovascular;  Laterality: N/A;   LEFT HEART CATH AND CORONARY ANGIOGRAPHY N/A 08/18/2017   Procedure: LEFT HEART CATH AND CORONARY ANGIOGRAPHY;  Surgeon: Jose Warner, Peter M, MD;  Location: [facility] INVASIVE CV LAB;  Service: Cardiovascular;  Laterality: N/A;   LEFT HEART CATH AND CORONARY ANGIOGRAPHY N/A 12/12/2020   Procedure: LEFT HEART CATH AND CORONARY ANGIOGRAPHY;  Surgeon: Lennette Bihari, MD;  Location: [facility] INVASIVE CV LAB;  Service: Cardiovascular;  Laterality: N/A;   NM MYOCAR PERF WALL MOTION  11/30/2007   protocol:Bruce, post EF 67%,mild perfusion defect seen in basal inferior consistant with Attenuation artifact, exercise cap.   ROBOT ASSISTED LAPAROSCOPIC RADICAL PROSTATECTOMY  05/20/2012   Procedure: ROBOTIC ASSISTED LAPAROSCOPIC RADICAL PROSTATECTOMY LEVEL 1;  Surgeon: Crecencio Mc, MD;  Location: [facility] ORS;  Service: Urology;  Laterality: N/A;      TRANSURETHRAL RESECTION OF PROSTATE  32yrs ago    Allergies  Allergen Reactions   Doxycycline Hives   Gluten Meal Other (See Comments)    Celiac, stomach crams , gas   Wheat Other (See Comments)    Celiac    Statins     Leg muscle pains - rosuvastatin, atorvastatin, zetia. Tolerates simvastatin   Sulfa Antibiotics Rash    Current Outpatient Medications  Medication Sig Dispense Refill   amLODipine (NORVASC) 2.5 MG tablet Take 1 tablet (2.5 mg total) by mouth daily. 90 tablet 3   aspirin EC 81 MG tablet Take 1 tablet (81 mg total) by mouth daily. 90 tablet 3   b complex vitamins tablet Take 1 tablet by mouth daily with supper.  bisoprolol (ZEBETA) 5 MG tablet Take 1.7 mg by mouth daily. This is 1/4 tab     budesonide-formoterol (BREYNA) 160-4.5 MCG/ACT inhaler Inhale 2 puffs into the lungs 2 (two) times daily. 30.6 g 2   dofetilide (TIKOSYN) 500 MCG capsule TAKE 1 CAPSULE TWICE A DAY 180 capsule 2   ezetimibe (ZETIA) 10 MG tablet Take 10 mg by mouth every evening.      fluticasone (FLONASE)  50 MCG/ACT nasal spray Place 1 spray into both nostrils daily.     Homeopathic Products (ZICAM COLD REMEDY PO) Take 1 tablet by mouth in the morning and at bedtime.     Multiple Vitamin (MULTIVITAMIN WITH MINERALS) TABS tablet Take 1 tablet by mouth daily with supper.     nitroGLYCERIN (NITROSTAT) 0.4 MG SL tablet Place 1 tablet (0.4 mg total) under the tongue every 5 (five) minutes x 3 doses as needed for chest pain. 25 tablet 1   pantoprazole (PROTONIX) 40 MG tablet Take 40 mg by mouth 2 (two) times daily.      pravastatin (PRAVACHOL) 80 MG tablet Take 80 mg by mouth every evening.      PROAIR HFA 108 (90 BASE) MCG/ACT inhaler Take 2 puffs by mouth every 4 (four) hours as needed for wheezing or shortness of breath. For shortness of breath.     Vitamin D, Ergocalciferol, 2000 units CAPS Take 2,000 Units by mouth at bedtime.      XARELTO 20 MG TABS tablet TAKE 1 TABLET DAILY WITH SUPPER 90 tablet 3   No current facility-administered medications for this visit.    Social History   Socioeconomic History   Marital status: Married    Spouse name: Not on file   Number of children: 3   Years of education: Not on file   Highest education level: Not on file  Occupational History    Employer: [employer]  Tobacco Use   Smoking status: Never    Passive exposure: Never   Smokeless tobacco: Never   Tobacco comments:    Never smoke 10/02/21  Vaping Use   Vaping status: Never Used  Substance and Sexual Activity   Alcohol use: Not Currently    Alcohol/week: 2.0 standard drinks of alcohol    Types: 2 Glasses of wine per week   Drug use: No   Sexual activity: Yes    Partners: Female  Other Topics Concern   Not on file  Social History Narrative   Not on file   Social Drivers of Health   Financial Resource Strain: Not on file  Food Insecurity: Unknown (06/10/2017)   Hunger Vital Sign    Worried About Running Out of Food in the Last Year: Patient declined    Barista in the Last Year:  Patient declined  Transportation Needs: Unknown (06/10/2017)   PRAPARE - Administrator, Civil Service (Medical): Patient declined    Lack of Transportation (Non-Medical): Patient declined  Physical Activity: Not on file  Stress: Not on file  Social Connections: Unknown (06/10/2017)   Social Connection and Isolation Panel [NHANES]    Frequency of Communication with Friends and Family: Patient declined    Frequency of Social Gatherings with Friends and Family: Patient declined    Attends Religious Services: Patient declined    Active Member of Clubs or Organizations: Patient declined    Attends Banker Meetings: Patient declined    Marital Status: Patient declined  Intimate Partner Violence: Unknown (06/10/2017)   Humiliation, Afraid,  Rape, and Kick questionnaire    Fear of Current or Ex-Partner: Patient declined    Emotionally Abused: Patient declined    Physically Abused: Patient declined    Sexually Abused: Patient declined    Socially, he has a PhD and Is now retired from [employer].  He had been in the turf business and floral arrangements.  He is married.  He has 3 sons who are successful in their work.  1 son is a pediatric liver transplant doctor at [employer] [place].  ROS General: Negative; No fevers, chills, or night sweats;  HEENT: Negative; No changes in vision or hearing, sinus congestion, difficulty swallowing Pulmonary: Negative; No cough, wheezing, shortness of breath, hemoptysis Cardiovascular: see HPI GI: Positive for celiac disease GU: Positive for erectile dysfunction since his robotic prostatic surgery No dysuria, hematuria, or difficulty voiding Musculoskeletal: Negative; no myalgias, joint pain, or weakness Hematologic/Oncology: Negative; no easy bruising, bleeding Endocrine: Negative; no heat/cold intolerance; no diabetes Neuro: Negative; no changes in balance, headaches Skin: Negative; No rashes or skin lesions Psychiatric:  Negative; No behavioral problems, depression Sleep: Negative; No snoring, daytime sleepiness, hypersomnolence, bruxism, restless legs, hypnogognic hallucinations, no cataplexy Other comprehensive 14 point system review is negative.   Physical examination BP 116/60 (BP Location: Left Arm, Patient Position: Sitting, Cuff Size: Normal)   Pulse (!) 58   Ht 5\' 8"  (1.727 m)   Wt 184 lb (83.5 kg)   SpO2 97%   BMI 27.98 kg/m     Repeat blood pressure by me was 120/64  Wt Readings from Last 3 Encounters:  07/13/23 184 lb (83.5 kg)  02/02/23 183 lb (83 kg)  01/21/23 183 lb (83 kg)   General: Alert, oriented, no distress.  Skin: normal turgor, no rashes, warm and dry HEENT: Normocephalic, atraumatic. Pupils equal round and reactive to light; sclera anicteric; extraocular muscles intact;  Nose without nasal septal hypertrophy Mouth/Parynx benign; Mallinpatti scale 3 Neck: No JVD, no carotid bruits; normal carotid upstroke Lungs: clear to ausculatation and percussion; no wheezing or rales Chest wall: without tenderness to palpitation Heart: PMI not displaced, RRR, s1 s2 normal, 1/6 systolic murmur, no diastolic murmur, no rubs, gallops, thrills, or heaves Abdomen: soft, nontender; no hepatosplenomehaly, BS+; abdominal aorta nontender and not dilated by palpation. Back: no CVA tenderness Pulses 2+ Musculoskeletal: full range of motion, normal strength, no joint deformities Extremities: no clubbing cyanosis or edema, Homan's sign negative  Neurologic: grossly nonfocal; Cranial nerves grossly wnl Psychologic: Normal mood and affect   EKG Interpretation Date/Time:  Monday July 13 2023 10:51:22 EDT Ventricular Rate:  58 PR Interval:  184 QRS Duration:  90 QT Interval:  460 QTC Calculation: 451 R Axis:   -1  Text Interpretation: Sinus bradycardia When compared with ECG of 21-Jan-2023 10:57, No significant change was found Confirmed by Nicki Guadalajara ([id number]) on 07/17/2023 10:50:55 AM     January 21, 2023 ECG (independently read by me): Sinus bradycardia 55 bpm.  QTc interval 4 7 4  ms  May 16, 2021 ECG (independently read by me): SInus bradycardia at 53, QTc 456 msec  March 28, 2020 ECG (independently read by me): NSR at 61; no ectopy; QTc 493  October 2020 ECG (independently read by me): NSR at 61; no ectopy: QTc 473 msec  March 2019 ECG (independently read by me): Sinus bradycardia 54 bpm.  No ectopy.  Normal intervals.  October 2019 ECG (independently read by me): Sinus bradycardia at 46 bpm.  No ectopy.  Normal intervals.  No ST  segment abnormalities  May 2019 ECG (independently read by me): Normal sinus rhythm at 66 bpm.normal sinus rhythm at 66 bpm.  No ectopy.  Normal intervals.  No ST segment depression. No ectopy.  Normal intervals.  No ST segment depression.  July 02, 2017 ECG (independently read by me): sinus bradycardia 51 bpm.  No ST segment changes.  Normal intervals.  November 2018ECG (independently read by me): Sinus bradycardia 56 bpm.  No ectopy.  Normal intervals.  October 2017 ECG (independently read by me): Sinus bradycardia 59 bpm.  Normal intervals.  No ectopy.  May 2015 ECG (and apparently read by me): Normal sinus rhythm at 60 beats per minute.  Early transition.  No ectopy.  PR interval 174 ms, QTc interval 460 ms.  ECG: (independently read by me): Sinus rhythm at 56 beats per minute. No ectopy. Normal intervals.  LABS:     Latest Ref Rng & Units 01/17/2023   12:00 PM 10/02/2022    9:07 AM 10/02/2021    1:44 PM  BMP  Glucose 70 - 99 mg/dL 161  096  045   BUN 8 - 23 mg/dL 19  10  14    Creatinine 0.61 - 1.24 mg/dL 4.09  8.11  9.14   Sodium 135 - 145 mmol/L 136  140  137   Potassium 3.5 - 5.1 mmol/L 4.0  4.4  4.6   Chloride 98 - 111 mmol/L 105  107  108   CO2 22 - 32 mmol/L 20  22  23    Calcium 8.9 - 10.3 mg/dL 8.7  9.2  8.7     Hepatic Function Panel     Component Value Date/Time   PROT 7.0 12/11/2020 1200   ALBUMIN 3.9  12/11/2020 1200   AST 26 12/11/2020 1200   ALT 31 12/11/2020 1200   ALKPHOS 55 12/11/2020 1200   BILITOT 0.8 12/11/2020 1200   BILIDIR 0.2 06/12/2017 0804   IBILI 0.5 06/12/2017 0804        Latest Ref Rng & Units 01/17/2023   12:00 PM 12/12/2020    2:01 AM 12/11/2020   12:36 PM  CBC  WBC 4.0 - 10.5 K/uL 8.9  5.6    Hemoglobin 13.0 - 17.0 g/dL 78.2  95.6  21.3   Hematocrit 39.0 - 52.0 % 40.7  42.5  44.0   Platelets 150 - 400 K/uL 244  221      BNP No results found for: "PROBNP"  Lipid Panel     Component Value Date/Time   CHOL 147 12/12/2020 0201   TRIG 150 (H) 12/12/2020 0201   HDL 40 (L) 12/12/2020 0201   CHOLHDL 3.7 12/12/2020 0201   VLDL 30 12/12/2020 0201   LDLCALC 77 12/12/2020 0201     RADIOLOGY: No results found.  IMPRESSION:  1. History of CAD involving native coronary artery of native heart with angina pectoris (HCC)   2. CAD S/P percutaneous coronary angioplasty: LAD, 2019   3. Paroxysmal atrial fibrillation (HCC)   4. Secondary hypercoagulable state (HCC)   5. Hyperlipidemia LDL goal <70   6. Moderate persistent asthma without complication   7. History of pericarditis     ASSESSMENT AND PLAN: Jose Warner Is a 73 year-old gentleman who had undergone cardiac catheterization in May 2006 and was found to have mild nonobstructive CAD.  He has a history of hyperlipidemia with very low HDL levels and remotely was on niacin in addition to his statin which previously was simvastatin 40 mg and now he  is on pravastatin 80 mg daily followed by Jose Warner.  He developed atrial fibrillation for which he was unaware following his robotic prostatectomy for his prostate cancer.  He was hospitalized in February 2019 with intermittent chest pain.  Due to worrisome symptoms of unstable angina, catheterization was performed and revealed a 95% mid LAD stenosis in a calcified segment and mild additional concomitant CAD.  Initially he received triple therapy but ultimately  aspirin was discontinued and he was maintained on Plavix and anticoagulation.Marland Kitchen  He developed repeat chest pain leading to repeat catheterization on August 18, 2017.  This chest pain was less intense character but was somewhat nitrate responsive.  He also took a Z-Pak for potential treatment of bronchitis.  He was diagnosed with acute pericarditis confirmed by MRI imaging September 2019. He was on Plavix in addition to Xarelto for his anticoagulation with his recent use of nonsteroidal anti-inflammatory medication and treatment with colchicine.  When I saw him in follow-up since it had been over 6 months since his stent placement I recommended he discontinue Plavix but resume a baby aspirin 81 mg and continue Xarelto.  He was on colchicine for at least 3 months duration.  Over time ultimately his pericarditis symptomatology ultimately resolved.  Unfortunately he continued experiences with atrial fibrillation and has required repeat cardioversion and due to recurrence was  hospitalized and underwent successful Tikosyn loading at 500 mg twice a day with pharmacologic conversion back to sinus rhythm.  Since Tikosyn initiation, he admits to only 1 brief episode of atrial fibrillation which occurred in the setting while he was on prednisone in addition to antibiotics for sinus infection.  He developed recurrent chest pain symptomatology and underwent repeat catheterization in August 2022 which revealed a patent stent and mild concomitant CAD.  Presently, he is maintaining sinus rhythm on Tikosyn and continues to take one quarter of a tablet of bisoprolol 5 mg.  ECG today remains stable with normal QT interval.  He had undergone a follow-up nuclear perfusion study for recurrent chest pain in 2024 which showed normal perfusion without scar or ischemia.  He continues to see Jose Warner who checks his laboratory regularly.  Blood pressure today is stable on current therapy.  He is aware of my upcoming retirement in several  months.  I will transition him to the care of Dr. Thurmon Fair and we will schedule an appointment in approximately 6 to 8 months for follow-up Cardiologic evaluation.   Lennette Bihari, MD, [facility]  07/17/2023  10:56 AM

## 2023-07-22 ENCOUNTER — Encounter: Payer: Self-pay | Admitting: Internal Medicine

## 2023-07-27 ENCOUNTER — Ambulatory Visit (HOSPITAL_BASED_OUTPATIENT_CLINIC_OR_DEPARTMENT_OTHER): Admitting: Pulmonary Disease

## 2023-07-27 ENCOUNTER — Encounter (HOSPITAL_BASED_OUTPATIENT_CLINIC_OR_DEPARTMENT_OTHER): Payer: Self-pay | Admitting: Pulmonary Disease

## 2023-07-27 VITALS — BP 122/80 | HR 67 | Ht 68.0 in | Wt 183.5 lb

## 2023-07-27 DIAGNOSIS — J4540 Moderate persistent asthma, uncomplicated: Secondary | ICD-10-CM

## 2023-07-27 MED ORDER — BUDESONIDE-FORMOTEROL FUMARATE 160-4.5 MCG/ACT IN AERO: 2.0000 | INHALATION_SPRAY | Freq: Two times a day (BID) | RESPIRATORY_TRACT | 3 refills | Status: AC

## 2023-07-27 NOTE — Patient Instructions (Signed)
 Moderate persistent asthma - improved control, not in exacerbation --CONTINUE Breyna 160-4.5 mcg TWO puffs in the morning and evening. Rinse mouth after use --CONTINUE Albuterol every 4 hours AS NEEDED --Consider pulmonary function tests  Asthma Action Plan Increase Albuterol 2 puffs three times a day for worsening shortness of breath, wheezing and cough. If you symptoms do not improve in 24-48 hours, please our office for evaluation and/or prednisone taper.  >Does not tolerate high doses of prednisone. Consider 20-30 mg dosing

## 2023-07-27 NOTE — Progress Notes (Signed)
 Subjective:   PATIENT ID: Jose Warner GENDER: male DOB: 10-24-1950, MRN: [id number]  Chief Complaint  Patient presents with   Follow-up    Asthma    Reason for Visit: F/u asthma  Mr. Jose Warner is  73 year old never smoker with asthma, CAD s/p stent, atrial fibrillation, HTN, hx pericarditis, GERD, hx prostate cancer who presents for evaluation for asthma.  Initial consult He was diagnosed with asthma 10 years ago. Denies childhood asthma. Has cough at baseline. He has been on Breyna/Symbicort 80 1 puff twice a day and albuterol 2 puffs twice a day since 2022. Will increase to 2 puffs on Symbicort/Breyna when symptoms worsen, usually in March. Primary symptom is productive cough, followed by shortness of breath and wheezing. Associated with chest tightness. Has had frequent infections with prolonged cough. His last treatment for exacerbation was in April/May 2024. Per communication with Dr. Duaine Dredge, he has been cefdinir, doxcycline and Augmentin. Was then prescribed low dose prednisone 10 mg BID for congestion and cough. He reports that his asthma attacks are usually preceded by sinus infections.  01/14/23 Since our last visit we increased the [facility] and has felt it has made a difference. He is less short of breath. Able to do more activities without stopping. Denies wheezing. Continues to clear his throat. Unchanged chest tightness.  He reports that his asthma attacks are usually preceded by sinus infections and has not had any sinus infections since then.  07/27/23 Since our last visit he reports that he is happy with his current dosing of Breyna. Has not had any exacerbations. He still has productive in the morning and seems to occur with drinking coffee. Still has shortness of breath uphill and when laying down. Denies reflux and taking antiacids.   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   Hyperlipidemia    Iron deficiency anemia    Pericarditis    Persistent atrial fibrillation (HCC) 06/05/2017   Prostate cancer (HCC) 05-10-12   ;bx. 04-12-12, dx   Spasm of esophagus 2013     Family History  Problem Relation Age of Onset   Stroke Mother    Heart disease Father    Heart attack Father    Cancer Sister    Stroke Maternal Grandfather    Cancer Paternal Grandmother    Heart attack Paternal Grandfather    Heart disease Paternal Grandfather    Colon cancer Neg Hx    Esophageal cancer Neg Hx    Pancreatic cancer Neg Hx    Rectal cancer Neg Hx    Stomach cancer Neg Hx      Social History   Occupational History    Employer: [employer]  Tobacco Use   Smoking status: Never    Passive exposure: Never   Smokeless tobacco: Never   Tobacco comments:  Never smoke 10/02/21  Vaping Use   Vaping status: Never Used  Substance and Sexual Activity   Alcohol use: Not Currently    Alcohol/week: 2.0 standard drinks of alcohol    Types: 2 Glasses of wine per week   Drug use: No   Sexual activity: Yes    Partners: Female    Allergies  Allergen Reactions   Doxycycline Hives   Gluten Meal Other (See Comments)    Celiac, stomach crams , gas   Wheat Other (See Comments)    Celiac    Statins     Leg muscle pains - rosuvastatin, atorvastatin, zetia. Tolerates simvastatin   Sulfa Antibiotics Rash     Outpatient Medications Prior to Visit  Medication Sig Dispense Refill   aspirin EC 81 MG  tablet Take 1 tablet (81 mg total) by mouth daily. 90 tablet 3   b complex vitamins tablet Take 1 tablet by mouth daily with supper.     bisoprolol (ZEBETA) 5 MG tablet Take 1.7 mg by mouth daily. This is 1/4 tab     dofetilide (TIKOSYN) 500 MCG capsule TAKE 1 CAPSULE TWICE A DAY 180 capsule 2   ezetimibe (ZETIA) 10 MG tablet Take 10 mg by mouth every evening.      fluticasone (FLONASE) 50 MCG/ACT nasal spray Place 1 spray into both nostrils daily.     Homeopathic Products (ZICAM COLD REMEDY PO) Take 1 tablet by mouth in the morning and at bedtime.     Multiple Vitamin (MULTIVITAMIN WITH MINERALS) TABS tablet Take 1 tablet by mouth daily with supper.     nitroGLYCERIN (NITROSTAT) 0.4 MG SL tablet Place 1 tablet (0.4 mg total) under the tongue every 5 (five) minutes x 3 doses as needed for chest pain. 25 tablet 1   pantoprazole (PROTONIX) 40 MG tablet Take 40 mg by mouth 2 (two) times daily.      pravastatin (PRAVACHOL) 80 MG tablet Take 80 mg by mouth every evening.      PROAIR HFA 108 (90 BASE) MCG/ACT inhaler Take 2 puffs by mouth every 4 (four) hours as needed for wheezing or shortness of breath. For shortness of breath.     Vitamin D, Ergocalciferol, 2000 units CAPS Take 2,000 Units by mouth at bedtime.      XARELTO 20 MG TABS tablet TAKE 1 TABLET DAILY WITH SUPPER 90 tablet 3   budesonide-formoterol (BREYNA) 160-4.5 MCG/ACT inhaler Inhale 2 puffs into the lungs 2 (two) times daily. 30.6 g 2   amLODipine (NORVASC) 2.5 MG tablet Take 1 tablet (2.5 mg total) by mouth daily. 90 tablet 3   No facility-administered medications prior to visit.    Review of Systems  Constitutional:  Negative for chills, diaphoresis, fever, malaise/fatigue and weight loss.  HENT:  Negative for congestion.   Respiratory:  Positive for cough and shortness of breath. Negative for hemoptysis, sputum production and wheezing.   Cardiovascular:  Negative for chest pain, palpitations and leg swelling.     Objective:    Vitals:   07/27/23 1414  BP: 122/80  Pulse: 67  SpO2: 98%  Weight: 183 lb 8 oz (83.2 kg)  Height: 5\' 8"  (1.727 m)   SpO2: 98 %  Physical Exam: General: Well-appearing, no acute distress HENT: [place], AT Eyes: EOMI, no scleral icterus Respiratory: Clear to auscultation bilaterally.  No crackles, wheezing or rales Cardiovascular: RRR, -M/R/G, no JVD Extremities:-Edema,-tenderness Neuro: AAO x4, CNII-XII grossly intact Psych: Normal mood, normal affect  Data Reviewed:  Imaging: CT  Chest 04/29/18 - Minimal apical scarring. Minimal bibasilar atelectasis CXR 09/16/22 - No acute infiltrate effusion or edema CXR 01/17/23 - No acute cardiopulmonary disease  PFT: None on file  Labs: CBC    Component Value Date/Time   WBC 8.9 01/17/2023 1200   RBC 4.76 01/17/2023 1200   HGB 13.3 01/17/2023 1200   HCT 40.7 01/17/2023 1200   PLT 244 01/17/2023 1200   MCV 85.5 01/17/2023 1200   MCH 27.9 01/17/2023 1200   MCHC 32.7 01/17/2023 1200   RDW 14.0 01/17/2023 1200   LYMPHSABS 1.9 12/11/2020 1200   MONOABS 0.8 12/11/2020 1200   EOSABS 0.2 12/11/2020 1200   BASOSABS 0.1 12/11/2020 1200   Absolute eos 12/11/20 - 200     Assessment & Plan:   Discussion: 73 year old never smoker with asthma, CAD s/p stent, atrial fibrillation, HTN, hx pericarditis, GERD, hx prostate cancer who presents for asthma follow-up. Improved on increased Breyna dose. Symptoms overall well controlled with some breakthrough that is positional.   Discussed clinical course and management of asthma including bronchodilator regimen, preventive care and action plan for exacerbation. Prior pharmacy price investigation: Jerral Ralph copay is $38.21, Advair HFA copay is $99.20.   Moderate persistent asthma - improved control, not in exacerbation --CONTINUE Breyna 160-4.5 mcg TWO puffs in the morning and evening. Rinse mouth after use --CONTINUE Albuterol every 4 hours AS NEEDED --Consider pulmonary function tests  Asthma  Action Plan Increase Albuterol 2 puffs three times a day for worsening shortness of breath, wheezing and cough. If you symptoms do not improve in 24-48 hours, please our office for evaluation and/or prednisone taper.  >Does not tolerate high doses of prednisone. Consider 20-30 mg dosing  Health Maintenance Immunization History  Administered Date(s) Administered   Fluad Quad(high Dose 65+) 01/07/2019   CT Lung Screen - never smoker. Not qualified  No orders of the defined types were placed in this encounter.  Meds ordered this encounter  Medications   budesonide-formoterol (BREYNA) 160-4.5 MCG/ACT inhaler    Sig: Inhale 2 puffs into the lungs 2 (two) times daily.    Dispense:  30.6 g    Refill:  3    Dispense 3 inhalers each time    Return in about 1 year (around 07/26/2024).  I have spent a total time of 20-minutes on the day of the appointment including chart review, data review, collecting history, coordinating care and discussing medical diagnosis and plan with the patient/family. Past medical history, allergies, medications were reviewed. Pertinent imaging, labs and tests included in this note have been reviewed and interpreted independently by me.  Aarish Rockers Mechele Collin, MD [facility] 07/27/2023 2:36 PM

## 2023-08-03 ENCOUNTER — Other Ambulatory Visit (HOSPITAL_COMMUNITY): Payer: Self-pay | Admitting: Physician Assistant

## 2023-10-05 ENCOUNTER — Encounter (HOSPITAL_COMMUNITY): Payer: Self-pay | Admitting: Physician Assistant

## 2023-10-05 ENCOUNTER — Ambulatory Visit (HOSPITAL_COMMUNITY)
Admission: RE | Admit: 2023-10-05 | Discharge: 2023-10-05 | Disposition: A | Discharge status: Home / Self Care | Source: Ambulatory Visit | Attending: Physician Assistant | Admitting: Physician Assistant

## 2023-10-05 VITALS — BP 118/68 | HR 59 | Ht 68.0 in | Wt 184.6 lb

## 2023-10-05 DIAGNOSIS — Z5181 Encounter for therapeutic drug level monitoring: Secondary | ICD-10-CM | POA: Diagnosis not present

## 2023-10-05 DIAGNOSIS — Z8546 Personal history of malignant neoplasm of prostate: Secondary | ICD-10-CM | POA: Insufficient documentation

## 2023-10-05 DIAGNOSIS — I4819 Other persistent atrial fibrillation: Secondary | ICD-10-CM | POA: Diagnosis not present

## 2023-10-05 DIAGNOSIS — D6869 Other thrombophilia: Secondary | ICD-10-CM | POA: Insufficient documentation

## 2023-10-05 DIAGNOSIS — I2510 Atherosclerotic heart disease of native coronary artery without angina pectoris: Secondary | ICD-10-CM | POA: Diagnosis not present

## 2023-10-05 DIAGNOSIS — Z955 Presence of coronary angioplasty implant and graft: Secondary | ICD-10-CM | POA: Diagnosis not present

## 2023-10-05 DIAGNOSIS — Z79899 Other long term (current) drug therapy: Secondary | ICD-10-CM | POA: Insufficient documentation

## 2023-10-05 DIAGNOSIS — Z7901 Long term (current) use of anticoagulants: Secondary | ICD-10-CM | POA: Diagnosis not present

## 2023-10-05 DIAGNOSIS — E785 Hyperlipidemia, unspecified: Secondary | ICD-10-CM | POA: Insufficient documentation

## 2023-10-05 NOTE — Progress Notes (Signed)
 Primary Care Physician: Candise Chambers, MD Referring Physician: [facility] Primary Cardiologist: Dr Alvis Ba (new) Primary EP: Dr Nunzio Belch previously   Jose Warner is a 73 y.o. male with a h/o persistent afib, HLD, CAD, and prostate cancer who presents to the AF Clinic for follow up. Patient loaded on dofetilide  9/15-9/18/20. He converted to SR on the medication and did not require DCCV. He is on Xarelto  for stroke prevention.   Patient seen at ED several times 11/2020 for CP and had LHC on 12/12/20 which showed nonobstructive CAD.   Patient returns for follow up for atrial fibrillation and dofetilide  monitoring. He reports that he has done well since his last visit. He denies any interim symptoms of afib. No bleeding issues on anticoagulation.   Today, he  denies symptoms of palpitations, chest pain, shortness of breath, orthopnea, PND, lower extremity edema, dizziness, presyncope, syncope, snoring, daytime somnolence, bleeding, or neurologic sequela. The patient is tolerating medications without difficulties and is otherwise without complaint today.    Past Medical History:  Diagnosis Date   Anemia    Bronchitis 05-10-12   past fall- 4 runs antibiotics due to bronchitis-uses [employer] as needed   Celiac disease    Colon polyps    adenomatous   Coronary artery disease    Diverticulosis    GERD (gastroesophageal reflux disease)    Hepatitis 05-10-12   was told non A, non B-unclean dental equip.   Hyperlipidemia    Iron deficiency anemia    Pericarditis    Persistent atrial fibrillation (HCC) 06/05/2017   Prostate cancer (HCC) 05-10-12   ;bx. 04-12-12, dx   Spasm of esophagus 2013    Current Outpatient Medications  Medication Sig Dispense Refill   amLODipine  (NORVASC ) 2.5 MG tablet Take 1 tablet (2.5 mg total) by mouth daily. 90 tablet 3   aspirin  EC 81 MG tablet Take 1 tablet (81 mg total) by mouth daily. 90 tablet 3   b complex vitamins tablet Take 1  tablet by mouth daily with supper.     bisoprolol  (ZEBETA ) 5 MG tablet Take 1.7 mg by mouth daily. This is 1/4 tab     budesonide -formoterol  (BREYNA ) 160-4.5 MCG/ACT inhaler Inhale 2 puffs into the lungs 2 (two) times daily. 30.6 g 3   dofetilide  (TIKOSYN ) 500 MCG capsule TAKE 1 CAPSULE TWICE A DAY 180 capsule 1   ezetimibe  (ZETIA ) 10 MG tablet Take 10 mg by mouth every evening.      fluticasone  (FLONASE) 50 MCG/ACT nasal spray Place 1 spray into both nostrils daily.     Multiple Vitamin (MULTIVITAMIN WITH MINERALS) TABS tablet Take 1 tablet by mouth daily with supper.     nitroGLYCERIN  (NITROSTAT ) 0.4 MG SL tablet Place 1 tablet (0.4 mg total) under the tongue every 5 (five) minutes x 3 doses as needed for chest pain. 25 tablet 1   pantoprazole  (PROTONIX ) 40 MG tablet Take 40 mg by mouth 2 (two) times daily.      pravastatin  (PRAVACHOL ) 80 MG tablet Take 80 mg by mouth every evening.      PROAIR  HFA 108 (90 BASE) MCG/ACT inhaler Take 2 puffs by mouth every 4 (four) hours as needed for wheezing or shortness of breath. For shortness of breath.     Vitamin D , Ergocalciferol , 2000 units CAPS Take 2,000 Units by mouth at bedtime.      XARELTO  20 MG TABS tablet TAKE 1 TABLET DAILY WITH SUPPER 90 tablet 3   No  current facility-administered medications for this encounter.    ROS- All systems are reviewed and negative except as per the HPI above  Physical Exam: Vitals:   10/05/23 1411  BP: 118/68  Pulse: (!) 59  Weight: 83.7 kg  Height: 5' 8 (1.727 m)   Wt Readings from Last 3 Encounters:  10/05/23 83.7 kg  07/27/23 83.2 kg  07/13/23 83.5 kg    GEN: Well nourished, well developed in no acute distress CARDIAC: Regular rate and rhythm, no murmurs, rubs, gallops RESPIRATORY:  Clear to auscultation without rales, wheezing or rhonchi  ABDOMEN: Soft, non-tender, non-distended EXTREMITIES:  No edema; No deformity    EKG today demonstrates SB Vent. rate 59 BPM PR interval 156 ms QRS  duration 86 ms QT/QTcB 480/475 ms   Epic records reviewed   Echo 01/07/19 demonstrated  1. Left ventricular ejection fraction, by visual estimation, is 55 to 60%. The left ventricle has normal function. Normal left ventricular size. There is mildly increased left ventricular hypertrophy.  2. Elevated left ventricular end-diastolic pressure.  3. Left ventricular diastolic Doppler parameters are consistent with pseudonormalization pattern of LV diastolic filling.  4. Global right ventricle has normal systolic function.The right ventricular size is normal. No increase in right ventricular wall thickness.  5. Left atrial size was moderately dilated.  6. Right atrial size was normal.  7. The mitral valve is normal in structure. Mild mitral valve regurgitation. No evidence of mitral stenosis.  8. The tricuspid valve is normal in structure. Tricuspid valve regurgitation is mild.  9. The aortic valve is normal in structure. Aortic valve regurgitation is mild to moderate by color flow Doppler. Structurally normal aortic valve, with no evidence of sclerosis or stenosis. 10. The pulmonic valve was normal in structure. Pulmonic valve regurgitation is not visualized by color flow Doppler. 11. The inferior vena cava is normal in size with greater than 50% respiratory variability, suggesting right atrial pressure of 3 mmHg.   CHA2DS2-VASc Score = 2  The patient's score is based upon: CHF History: 0 HTN History: 0 Diabetes History: 0 Stroke History: 0 Vascular Disease History: 1 Age Score: 1 Gender Score: 0       ASSESSMENT AND PLAN: Persistent Atrial Fibrillation (ICD10:  I48.19) The patient's CHA2DS2-VASc score is 2, indicating a 2.2% annual risk of stroke.   S/p dofetilide  loading 12/2018 Patient appears to be maintaining SR. Continue dofetilide  500 mcg BID Continue bisoprolol  1.7 mg daily (1/4 tablet) Continue Xarelto  20 mg daily  Secondary Hypercoagulable State (ICD10:  D68.69) The  patient is at significant risk for stroke/thromboembolism based upon his CHA2DS2-VASc Score of 2.  Continue Rivaroxaban  (Xarelto ). No bleeding issues.  High Risk Medication Monitoring (ICD 10: Z79.899) QT interval on ECG acceptable for dofetilide  monitoring. Check bmet/mag today.     CAD DES to LAD 2019 No anginal symptoms Followed by Dr Alvis Ba    Follow up with Dr Alvis Ba per recall. AF clinic in one year.    Myrtha Ates PA-C [facility] [address] [place], [place] [address] [phone]

## 2023-10-06 ENCOUNTER — Ambulatory Visit (HOSPITAL_COMMUNITY): Payer: Self-pay | Admitting: Physician Assistant

## 2023-10-06 LAB — BASIC METABOLIC PANEL WITH GFR
BUN/Creatinine Ratio: 16 (ref 10–24)
BUN: 17 mg/dL (ref 8–27)
CO2: 18 mmol/L — ABNORMAL LOW (ref 20–29)
Calcium: 8.8 mg/dL (ref 8.6–10.2)
Chloride: 106 mmol/L (ref 96–106)
Creatinine, Ser: 1.08 mg/dL (ref 0.76–1.27)
Glucose: 90 mg/dL (ref 70–99)
Potassium: 4.5 mmol/L (ref 3.5–5.2)
Sodium: 139 mmol/L (ref 134–144)
eGFR: 73 mL/min/{1.73_m2} (ref >59)

## 2023-10-06 LAB — MAGNESIUM: Magnesium: 2.1 mg/dL (ref 1.6–2.3)

## 2023-12-14 ENCOUNTER — Other Ambulatory Visit (HOSPITAL_COMMUNITY): Payer: Self-pay | Admitting: Physician Assistant

## 2023-12-30 ENCOUNTER — Encounter: Payer: Self-pay | Admitting: Cardiovascular Disease

## 2024-02-01 ENCOUNTER — Other Ambulatory Visit (HOSPITAL_COMMUNITY): Payer: Self-pay | Admitting: Physician Assistant

## 2024-04-19 ENCOUNTER — Ambulatory Visit: Attending: Cardiovascular Disease | Admitting: Cardiovascular Disease

## 2024-04-19 ENCOUNTER — Encounter: Payer: Self-pay | Admitting: Cardiovascular Disease

## 2024-04-19 VITALS — BP 122/66 | HR 59 | Ht 68.0 in | Wt 185.0 lb

## 2024-04-19 DIAGNOSIS — I4819 Other persistent atrial fibrillation: Secondary | ICD-10-CM | POA: Diagnosis not present

## 2024-04-19 DIAGNOSIS — Z79899 Other long term (current) drug therapy: Secondary | ICD-10-CM | POA: Diagnosis present

## 2024-04-19 DIAGNOSIS — Z5181 Encounter for therapeutic drug level monitoring: Secondary | ICD-10-CM | POA: Insufficient documentation

## 2024-04-19 DIAGNOSIS — D6869 Other thrombophilia: Secondary | ICD-10-CM | POA: Insufficient documentation

## 2024-04-19 DIAGNOSIS — Z8679 Personal history of other diseases of the circulatory system: Secondary | ICD-10-CM | POA: Insufficient documentation

## 2024-04-19 DIAGNOSIS — E785 Hyperlipidemia, unspecified: Secondary | ICD-10-CM | POA: Insufficient documentation

## 2024-04-19 DIAGNOSIS — I25119 Atherosclerotic heart disease of native coronary artery with unspecified angina pectoris: Secondary | ICD-10-CM | POA: Insufficient documentation

## 2024-04-19 DIAGNOSIS — R7303 Prediabetes: Secondary | ICD-10-CM | POA: Diagnosis present

## 2024-04-19 NOTE — Progress Notes (Signed)
 " Cardiology Office Note   Date:  04/23/2024  ID:  Read, Bonelli 01/14/1951, MRN [id number] PCP: Windy Coy, MD  [facility] Providers Cardiologist:  Jerel Balding, MD     History of Present Illness Jose Warner is a 73 y.o. male transitioning Cardiology care after Dr. Joesphine retirement.  He has coronary artery disease (2019 mid LAD stent, acute pericarditis 5 months later; stent patent at repeat cath June 2022)  and infrequent episodes of paroxysmal atrial fibrillation who presents for cardiovascular follow-up. Additional problems include asthma, HTN, dyslipidemia, prediabetes, GERD  His previous angina was an intermittent chest pain described as a retrosternal 'squeeze' or 'push' sensation, sometimes radiating to his back. He still has occasional chest discomfort, mostly nonexertional, inconsistent in nature and location. He has a history of pericarditis, when the pain was stabbing and has also had GERD related discomfort. He continues to take amlodipine  for possible angina benefit.  He experienced some shortness of breath and chest pain with exertion, such as walking up a small incline in cold weather, occurring for the past three weeks. No recent episodes of the 'push' sensation associated with coronary issues.  He first had atrial fibrillation in 2014, became persistent in 2020, treated with cardioversion and started on dofetilide . He is compliant with xarelto  and denies bleeding issues.  He experiences shortness of breath and chest pain with exertion, such as walking up a small incline in cold weather, occurring for the past three weeks. No recent episodes of the 'push' sensation associated with coronary issues.  He has prediabetes with blood sugar levels in the prediabetes range. He has lost weight over the past few years, attributed to reduced food intake, and his triglyceride levels have improved.  He denies smoking. He is married and does most of the cooking at  home, focusing on healthier food choices.  Metabolic control is fair on 01/25/2024 labs: HDL 52, LDL 50, LDL particle number 1003. A1c was 6.1%. Creatinine 1.1.  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  One of his sons is a liver transplant physician at [employer].   Studies Reviewed EKG Interpretation Date/Time:  Tuesday April 19 2024 16:26:47 EST Ventricular Rate:  59 PR Interval:  172 QRS Duration:  88 QT Interval:  456 QTC Calculation: 451 R Axis:   10  Text Interpretation: Sinus bradycardia When compared with ECG of 05-Oct-2023 14:13, No significant change was found Confirmed by Ronnell Makarewicz [phone]) on 04/19/2024 4:41:29 PM     Risk Assessment/Calculations  CHA2DS2-VASc Score = 2   This indicates a 2.2% annual risk of stroke. The patient's score is based upon: CHF History: 0 HTN History: 0 Diabetes History: 0 Stroke History: 0 Vascular Disease History: 1 Age Score: 1 Gender Score: 0     Physical Exam VS:  BP 122/66   Pulse (!) 59   Ht 5' 8 (1.727 m)   Wt 185 lb (83.9 kg)   SpO2 97%   BMI 28.13 kg/m        Wt Readings from Last 3 Encounters:  04/19/24 185 lb (83.9 kg)  10/05/23 184 lb 9.6 oz (83.7 kg)  07/27/23 183 lb 8 oz (83.2 kg)    GEN: Well nourished, well developed in no acute distress. Appears younger than stated age NECK: No JVD; No carotid bruits CARDIAC: RRR, no murmurs, rubs, gallops RESPIRATORY:  Clear to auscultation without rales, wheezing or rhonchi  ABDOMEN: Soft, non-tender, non-distended EXTREMITIES:  No edema; No deformity   ASSESSMENT AND  PLAN Persistent atrial fibrillation:  No recent episodes of atrial fibrillation. Heart rhythm is normal on dofetilide  with a QT interval within acceptable limits. Bradycardia present with a heart rate of 59 bpm, limiting beta blocker use (also has asthma so using bisoprolol ). Continue current management and monitoring.   Anticoagulation: No bleeding problems on Xarelto .  Stop aspirin  if he has bleeding  problems with xarelto -ASA combination. Dofetilide : has been very effective; normal QTc. Coronary artery disease with stable angina: Continued on amlodipine  for possible functional angina (vasospasm, endothelial dysfunction?).  Intermittent chest pain with varying characteristics, including squeezing and pain in different locations. Previous pericarditis had different pain syndrome. Recent exertional dyspnea and chest pain, but no recent episodes of severe coronary symptoms. Previous SPECT scan was normal, and last heart catheterization in August 2022 showed no significant findings. PET scan discussed as a superior diagnostic tool for future evaluation if needed.   Hyperlipidemia:  LDL cholesterol is well-controlled with current treatment, with levels below 50 mg/dL. HDL cholesterol is 52 mg/dL, which is acceptable. LDL particle number is slightly above target at 1003, but overall lipid profile is satisfactory.  Continue same meds. Prediabetes: mildly elevated hemoglobin A1c at 6.1% . Emphasis on dietary modifications to manage blood sugar levels and reduce insulin resistance.   Encouraged regular physical activity, aiming for 2.5 hours per week.  Advised dietary modifications to reduce glycemic index foods and increase fiber intake.      Dispo: follow up in 6 months in Afib clinic, then 1 year with me  Signed, Jerel Balding, MD   "

## 2024-04-19 NOTE — Patient Instructions (Signed)
